# Patient Record
Sex: Female | Born: 1965 | Race: Black or African American | Hispanic: No | Marital: Married | Smoking: Current every day smoker
Health system: Southern US, Community
[De-identification: ages and names within clinical notes are randomized; demographics above are authoritative.]

## PROBLEM LIST (undated history)

## (undated) DIAGNOSIS — G459 Transient cerebral ischemic attack, unspecified: Secondary | ICD-10-CM

## (undated) DIAGNOSIS — I2510 Atherosclerotic heart disease of native coronary artery without angina pectoris: Secondary | ICD-10-CM

## (undated) DIAGNOSIS — I255 Ischemic cardiomyopathy: Secondary | ICD-10-CM

## (undated) DIAGNOSIS — I639 Cerebral infarction, unspecified: Secondary | ICD-10-CM

## (undated) DIAGNOSIS — I10 Essential (primary) hypertension: Secondary | ICD-10-CM

## (undated) DIAGNOSIS — J45909 Unspecified asthma, uncomplicated: Secondary | ICD-10-CM

## (undated) DIAGNOSIS — K219 Gastro-esophageal reflux disease without esophagitis: Secondary | ICD-10-CM

## (undated) DIAGNOSIS — R51 Headache: Secondary | ICD-10-CM

## (undated) DIAGNOSIS — R519 Headache, unspecified: Secondary | ICD-10-CM

## (undated) DIAGNOSIS — E7800 Pure hypercholesterolemia, unspecified: Secondary | ICD-10-CM

## (undated) DIAGNOSIS — D219 Benign neoplasm of connective and other soft tissue, unspecified: Secondary | ICD-10-CM

## (undated) DIAGNOSIS — Z8614 Personal history of Methicillin resistant Staphylococcus aureus infection: Secondary | ICD-10-CM

## (undated) DIAGNOSIS — G4701 Insomnia due to medical condition: Secondary | ICD-10-CM

## (undated) HISTORY — DX: Ischemic cardiomyopathy: I25.5

## (undated) HISTORY — PX: WISDOM TOOTH EXTRACTION: SHX21

## (undated) HISTORY — DX: Atherosclerotic heart disease of native coronary artery without angina pectoris: I25.10

---

## 1998-12-27 ENCOUNTER — Inpatient Hospital Stay (HOSPITAL_COMMUNITY): Admission: EM | Admit: 1998-12-27 | Discharge: 1999-01-03 | Payer: Self-pay | Admitting: Emergency Medicine

## 1998-12-27 ENCOUNTER — Encounter: Payer: Self-pay | Admitting: Emergency Medicine

## 1998-12-30 ENCOUNTER — Encounter: Payer: Self-pay | Admitting: General Surgery

## 1999-10-02 ENCOUNTER — Other Ambulatory Visit: Admission: RE | Admit: 1999-10-02 | Discharge: 1999-10-02 | Payer: Self-pay | Admitting: Gynecology

## 2000-06-16 ENCOUNTER — Emergency Department (HOSPITAL_COMMUNITY): Admission: EM | Admit: 2000-06-16 | Discharge: 2000-06-16 | Payer: Self-pay | Admitting: Emergency Medicine

## 2000-08-31 ENCOUNTER — Other Ambulatory Visit: Admission: RE | Admit: 2000-08-31 | Discharge: 2000-08-31 | Payer: Self-pay | Admitting: Internal Medicine

## 2000-09-03 ENCOUNTER — Encounter: Payer: Self-pay | Admitting: Internal Medicine

## 2000-09-03 ENCOUNTER — Encounter: Admission: RE | Admit: 2000-09-03 | Discharge: 2000-09-03 | Payer: Self-pay | Admitting: Internal Medicine

## 2001-07-12 ENCOUNTER — Encounter (INDEPENDENT_AMBULATORY_CARE_PROVIDER_SITE_OTHER): Payer: Self-pay | Admitting: Specialist

## 2001-07-12 ENCOUNTER — Ambulatory Visit (HOSPITAL_COMMUNITY): Admission: AD | Admit: 2001-07-12 | Discharge: 2001-07-12 | Payer: Self-pay | Admitting: *Deleted

## 2001-07-12 ENCOUNTER — Encounter: Payer: Self-pay | Admitting: Emergency Medicine

## 2003-02-19 ENCOUNTER — Encounter (INDEPENDENT_AMBULATORY_CARE_PROVIDER_SITE_OTHER): Payer: Self-pay | Admitting: Specialist

## 2003-02-19 ENCOUNTER — Encounter: Payer: Self-pay | Admitting: Obstetrics and Gynecology

## 2003-02-19 ENCOUNTER — Ambulatory Visit (HOSPITAL_COMMUNITY): Admission: AD | Admit: 2003-02-19 | Discharge: 2003-02-19 | Payer: Self-pay | Admitting: Obstetrics and Gynecology

## 2003-02-28 ENCOUNTER — Other Ambulatory Visit: Admission: RE | Admit: 2003-02-28 | Discharge: 2003-02-28 | Payer: Self-pay | Admitting: Obstetrics and Gynecology

## 2003-05-18 ENCOUNTER — Other Ambulatory Visit: Admission: RE | Admit: 2003-05-18 | Discharge: 2003-05-18 | Payer: Self-pay | Admitting: Obstetrics and Gynecology

## 2003-12-26 ENCOUNTER — Emergency Department (HOSPITAL_COMMUNITY): Admission: EM | Admit: 2003-12-26 | Discharge: 2003-12-26 | Payer: Self-pay

## 2004-04-28 ENCOUNTER — Emergency Department (HOSPITAL_COMMUNITY): Admission: EM | Admit: 2004-04-28 | Discharge: 2004-04-28 | Payer: Self-pay | Admitting: Emergency Medicine

## 2004-11-03 ENCOUNTER — Emergency Department (HOSPITAL_COMMUNITY): Admission: EM | Admit: 2004-11-03 | Discharge: 2004-11-03 | Payer: Self-pay | Admitting: Emergency Medicine

## 2005-04-04 ENCOUNTER — Emergency Department (HOSPITAL_COMMUNITY): Admission: EM | Admit: 2005-04-04 | Discharge: 2005-04-04 | Payer: Self-pay | Admitting: Family Medicine

## 2005-10-05 ENCOUNTER — Emergency Department (HOSPITAL_COMMUNITY): Admission: EM | Admit: 2005-10-05 | Discharge: 2005-10-06 | Payer: Self-pay | Admitting: Emergency Medicine

## 2005-10-05 ENCOUNTER — Emergency Department (HOSPITAL_COMMUNITY): Admission: EM | Admit: 2005-10-05 | Discharge: 2005-10-05 | Payer: Self-pay | Admitting: Emergency Medicine

## 2005-11-17 ENCOUNTER — Emergency Department (HOSPITAL_COMMUNITY): Admission: EM | Admit: 2005-11-17 | Discharge: 2005-11-17 | Payer: Self-pay | Admitting: Family Medicine

## 2005-12-08 ENCOUNTER — Emergency Department (HOSPITAL_COMMUNITY): Admission: EM | Admit: 2005-12-08 | Discharge: 2005-12-08 | Payer: Self-pay | Admitting: Family Medicine

## 2006-04-21 ENCOUNTER — Inpatient Hospital Stay (HOSPITAL_COMMUNITY): Admission: EM | Admit: 2006-04-21 | Discharge: 2006-04-26 | Payer: Self-pay | Admitting: Emergency Medicine

## 2007-04-04 ENCOUNTER — Emergency Department (HOSPITAL_COMMUNITY): Admission: EM | Admit: 2007-04-04 | Discharge: 2007-04-04 | Payer: Self-pay | Admitting: Emergency Medicine

## 2007-05-10 ENCOUNTER — Emergency Department (HOSPITAL_COMMUNITY): Admission: EM | Admit: 2007-05-10 | Discharge: 2007-05-11 | Payer: Self-pay | Admitting: *Deleted

## 2007-09-28 ENCOUNTER — Emergency Department (HOSPITAL_COMMUNITY): Admission: EM | Admit: 2007-09-28 | Discharge: 2007-09-28 | Payer: Self-pay | Admitting: Emergency Medicine

## 2008-01-07 ENCOUNTER — Emergency Department (HOSPITAL_COMMUNITY): Admission: EM | Admit: 2008-01-07 | Discharge: 2008-01-07 | Payer: Self-pay | Admitting: Emergency Medicine

## 2008-05-09 ENCOUNTER — Emergency Department (HOSPITAL_COMMUNITY): Admission: EM | Admit: 2008-05-09 | Discharge: 2008-05-09 | Payer: Self-pay | Admitting: Family Medicine

## 2008-05-20 LAB — CONVERTED CEMR LAB: Pap Smear: NEGATIVE

## 2008-06-06 ENCOUNTER — Ambulatory Visit (HOSPITAL_COMMUNITY): Admission: RE | Admit: 2008-06-06 | Discharge: 2008-06-06 | Payer: Self-pay | Admitting: Obstetrics

## 2009-04-22 ENCOUNTER — Ambulatory Visit (HOSPITAL_BASED_OUTPATIENT_CLINIC_OR_DEPARTMENT_OTHER): Admission: RE | Admit: 2009-04-22 | Discharge: 2009-04-22 | Payer: Self-pay | Admitting: Cardiology

## 2009-04-22 ENCOUNTER — Encounter: Payer: Self-pay | Admitting: Internal Medicine

## 2009-04-26 ENCOUNTER — Emergency Department (HOSPITAL_COMMUNITY): Admission: EM | Admit: 2009-04-26 | Discharge: 2009-04-26 | Payer: Self-pay | Admitting: Family Medicine

## 2009-04-28 ENCOUNTER — Ambulatory Visit: Payer: Self-pay | Admitting: Internal Medicine

## 2009-05-15 ENCOUNTER — Emergency Department (HOSPITAL_COMMUNITY): Admission: EM | Admit: 2009-05-15 | Discharge: 2009-05-15 | Payer: Self-pay | Admitting: Emergency Medicine

## 2009-05-16 ENCOUNTER — Telehealth: Payer: Self-pay | Admitting: Internal Medicine

## 2009-05-18 ENCOUNTER — Emergency Department (HOSPITAL_COMMUNITY): Admission: EM | Admit: 2009-05-18 | Discharge: 2009-05-18 | Payer: Self-pay | Admitting: Emergency Medicine

## 2009-05-27 ENCOUNTER — Ambulatory Visit: Payer: Self-pay | Admitting: Internal Medicine

## 2009-05-27 DIAGNOSIS — Z9189 Other specified personal risk factors, not elsewhere classified: Secondary | ICD-10-CM | POA: Insufficient documentation

## 2009-05-27 DIAGNOSIS — I10 Essential (primary) hypertension: Secondary | ICD-10-CM | POA: Insufficient documentation

## 2009-05-27 DIAGNOSIS — G4700 Insomnia, unspecified: Secondary | ICD-10-CM | POA: Insufficient documentation

## 2009-05-27 DIAGNOSIS — Z8673 Personal history of transient ischemic attack (TIA), and cerebral infarction without residual deficits: Secondary | ICD-10-CM | POA: Insufficient documentation

## 2009-05-28 ENCOUNTER — Telehealth: Payer: Self-pay | Admitting: Internal Medicine

## 2009-05-29 ENCOUNTER — Telehealth (INDEPENDENT_AMBULATORY_CARE_PROVIDER_SITE_OTHER): Payer: Self-pay | Admitting: *Deleted

## 2009-05-29 ENCOUNTER — Telehealth: Payer: Self-pay | Admitting: Internal Medicine

## 2009-06-10 ENCOUNTER — Telehealth (INDEPENDENT_AMBULATORY_CARE_PROVIDER_SITE_OTHER): Payer: Self-pay | Admitting: *Deleted

## 2009-06-12 ENCOUNTER — Telehealth: Payer: Self-pay | Admitting: Internal Medicine

## 2009-06-17 ENCOUNTER — Telehealth: Payer: Self-pay | Admitting: Internal Medicine

## 2009-06-18 ENCOUNTER — Ambulatory Visit: Payer: Self-pay | Admitting: Internal Medicine

## 2009-06-18 DIAGNOSIS — F17200 Nicotine dependence, unspecified, uncomplicated: Secondary | ICD-10-CM | POA: Insufficient documentation

## 2009-06-18 DIAGNOSIS — Z87898 Personal history of other specified conditions: Secondary | ICD-10-CM | POA: Insufficient documentation

## 2009-06-28 ENCOUNTER — Telehealth: Payer: Self-pay | Admitting: Internal Medicine

## 2009-07-02 ENCOUNTER — Telehealth: Payer: Self-pay | Admitting: Internal Medicine

## 2009-07-05 ENCOUNTER — Telehealth: Payer: Self-pay | Admitting: Internal Medicine

## 2009-07-23 ENCOUNTER — Telehealth: Payer: Self-pay | Admitting: Internal Medicine

## 2009-07-24 ENCOUNTER — Telehealth: Payer: Self-pay | Admitting: Internal Medicine

## 2009-07-29 ENCOUNTER — Telehealth: Payer: Self-pay | Admitting: Internal Medicine

## 2009-08-08 ENCOUNTER — Telehealth (INDEPENDENT_AMBULATORY_CARE_PROVIDER_SITE_OTHER): Payer: Self-pay | Admitting: *Deleted

## 2009-08-12 ENCOUNTER — Telehealth: Payer: Self-pay | Admitting: Internal Medicine

## 2009-08-15 ENCOUNTER — Ambulatory Visit: Payer: Self-pay | Admitting: Internal Medicine

## 2009-08-19 ENCOUNTER — Telehealth (INDEPENDENT_AMBULATORY_CARE_PROVIDER_SITE_OTHER): Payer: Self-pay | Admitting: *Deleted

## 2009-08-20 ENCOUNTER — Encounter: Payer: Self-pay | Admitting: Internal Medicine

## 2009-08-30 ENCOUNTER — Telehealth: Payer: Self-pay | Admitting: Internal Medicine

## 2009-09-02 ENCOUNTER — Telehealth: Payer: Self-pay | Admitting: Internal Medicine

## 2009-09-03 ENCOUNTER — Encounter: Payer: Self-pay | Admitting: Internal Medicine

## 2009-09-04 ENCOUNTER — Telehealth: Payer: Self-pay | Admitting: Internal Medicine

## 2009-09-11 ENCOUNTER — Encounter: Payer: Self-pay | Admitting: Internal Medicine

## 2009-09-18 ENCOUNTER — Emergency Department (HOSPITAL_COMMUNITY): Admission: EM | Admit: 2009-09-18 | Discharge: 2009-09-18 | Payer: Self-pay | Admitting: Family Medicine

## 2009-09-23 ENCOUNTER — Emergency Department (HOSPITAL_COMMUNITY): Admission: EM | Admit: 2009-09-23 | Discharge: 2009-09-23 | Payer: Self-pay | Admitting: Family Medicine

## 2009-09-24 ENCOUNTER — Telehealth: Payer: Self-pay | Admitting: Internal Medicine

## 2009-10-04 ENCOUNTER — Emergency Department (HOSPITAL_COMMUNITY): Admission: EM | Admit: 2009-10-04 | Discharge: 2009-10-04 | Payer: Self-pay | Admitting: Emergency Medicine

## 2009-10-08 ENCOUNTER — Telehealth: Payer: Self-pay | Admitting: Internal Medicine

## 2009-10-16 ENCOUNTER — Telehealth: Payer: Self-pay | Admitting: Internal Medicine

## 2009-10-16 ENCOUNTER — Encounter: Payer: Self-pay | Admitting: Internal Medicine

## 2009-11-25 ENCOUNTER — Telehealth (INDEPENDENT_AMBULATORY_CARE_PROVIDER_SITE_OTHER): Payer: Self-pay | Admitting: *Deleted

## 2009-12-04 ENCOUNTER — Telehealth: Payer: Self-pay | Admitting: Internal Medicine

## 2009-12-17 ENCOUNTER — Telehealth: Payer: Self-pay | Admitting: Internal Medicine

## 2010-01-13 ENCOUNTER — Emergency Department (HOSPITAL_COMMUNITY): Admission: EM | Admit: 2010-01-13 | Discharge: 2010-01-13 | Payer: Self-pay | Admitting: Emergency Medicine

## 2010-01-15 ENCOUNTER — Emergency Department (HOSPITAL_COMMUNITY): Admission: EM | Admit: 2010-01-15 | Discharge: 2010-01-15 | Payer: Self-pay | Admitting: Emergency Medicine

## 2010-02-07 ENCOUNTER — Telehealth (INDEPENDENT_AMBULATORY_CARE_PROVIDER_SITE_OTHER): Payer: Self-pay | Admitting: *Deleted

## 2010-02-12 ENCOUNTER — Telehealth (INDEPENDENT_AMBULATORY_CARE_PROVIDER_SITE_OTHER): Payer: Self-pay | Admitting: *Deleted

## 2010-02-24 ENCOUNTER — Ambulatory Visit: Payer: Self-pay | Admitting: Internal Medicine

## 2010-02-27 ENCOUNTER — Telehealth: Payer: Self-pay | Admitting: Internal Medicine

## 2010-04-10 ENCOUNTER — Telehealth (INDEPENDENT_AMBULATORY_CARE_PROVIDER_SITE_OTHER): Payer: Self-pay | Admitting: *Deleted

## 2010-04-18 ENCOUNTER — Telehealth: Payer: Self-pay | Admitting: Internal Medicine

## 2010-04-22 ENCOUNTER — Telehealth: Payer: Self-pay | Admitting: Internal Medicine

## 2010-04-25 ENCOUNTER — Encounter: Payer: Self-pay | Admitting: Internal Medicine

## 2010-04-25 ENCOUNTER — Ambulatory Visit: Payer: Self-pay | Admitting: Internal Medicine

## 2010-04-28 ENCOUNTER — Telehealth: Payer: Self-pay | Admitting: Internal Medicine

## 2010-04-28 LAB — CONVERTED CEMR LAB
ALT: 10 units/L (ref 0–35)
AST: 15 units/L (ref 0–37)
Albumin: 4.7 g/dL (ref 3.5–5.2)
Alkaline Phosphatase: 82 units/L (ref 39–117)
BUN: 25 mg/dL — ABNORMAL HIGH (ref 6–23)
Basophils Absolute: 0 10*3/uL (ref 0.0–0.1)
Basophils Relative: 0 % (ref 0–1)
CO2: 22 meq/L (ref 19–32)
Calcium: 9.5 mg/dL (ref 8.4–10.5)
Chloride: 105 meq/L (ref 96–112)
Cholesterol: 179 mg/dL (ref 0–200)
Creatinine, Ser: 1.69 mg/dL — ABNORMAL HIGH (ref 0.40–1.20)
Eosinophils Absolute: 0.1 10*3/uL (ref 0.0–0.7)
Eosinophils Relative: 1 % (ref 0–5)
Glucose, Bld: 94 mg/dL (ref 70–99)
HCT: 42.3 % (ref 36.0–46.0)
HDL: 65 mg/dL (ref >39)
Hemoglobin: 13.9 g/dL (ref 12.0–15.0)
LDL Cholesterol: 92 mg/dL (ref 0–99)
Lymphocytes Relative: 39 % (ref 12–46)
Lymphs Abs: 2.7 10*3/uL (ref 0.7–4.0)
MCHC: 32.9 g/dL (ref 30.0–36.0)
MCV: 89.1 fL (ref 78.0–100.0)
Monocytes Absolute: 0.4 10*3/uL (ref 0.1–1.0)
Monocytes Relative: 6 % (ref 3–12)
Neutro Abs: 3.7 10*3/uL (ref 1.7–7.7)
Neutrophils Relative %: 53 % (ref 43–77)
Platelets: 236 10*3/uL (ref 150–400)
Potassium: 4.5 meq/L (ref 3.5–5.3)
RBC: 4.75 M/uL (ref 3.87–5.11)
RDW: 15.6 % — ABNORMAL HIGH (ref 11.5–15.5)
Sodium: 139 meq/L (ref 135–145)
TSH: 0.889 microintl units/mL (ref 0.350–4.500)
Total Bilirubin: 0.5 mg/dL (ref 0.3–1.2)
Total CHOL/HDL Ratio: 2.8
Total Protein: 7.2 g/dL (ref 6.0–8.3)
Triglycerides: 110 mg/dL (ref <150)
VLDL: 22 mg/dL (ref 0–40)
WBC: 6.9 10*3/uL (ref 4.0–10.5)

## 2010-05-26 ENCOUNTER — Emergency Department (HOSPITAL_COMMUNITY)
Admission: EM | Admit: 2010-05-26 | Discharge: 2010-05-26 | Payer: Self-pay | Source: Home / Self Care | Admitting: Emergency Medicine

## 2010-07-22 ENCOUNTER — Emergency Department (HOSPITAL_COMMUNITY)
Admission: EM | Admit: 2010-07-22 | Discharge: 2010-07-22 | Payer: Self-pay | Source: Home / Self Care | Admitting: Emergency Medicine

## 2010-08-19 NOTE — Letter (Signed)
Summary: Physicians Acommodation Certificate/[employer]  Physicians Acommodation Certificate/[employer]   Imported By: Sherian Rein 08/23/2009 07:38:34  _____________________________________________________________________  External Attachment:    Type:   Image     Comment:   External Document

## 2010-08-19 NOTE — Progress Notes (Signed)
Summary: medication question  Phone Note Call from Patient Call back at [phone]   Caller: Patient Call For: Jadore Veals Reason for Call: Talk to Nurse Summary of Call: Has a question about the quantity of her xanax 1mg . Initial call taken by: Darletta Moll,  Dec 17, 2009 3:08 PM  Follow-up for Phone Call        pt wanted to know what to do after she runs out of refills, because she is applying for medicaid and cannot come in for ov until after this is approved. Sh estates she is trying to get appt at [facility]. I advised if [facility] gets [facility] then they may refill her med. If not she can call and we can ask CY if ok to refill as long as it is not an early refill. She states she will let us know. [name] CMA  Dec 17, 2009 3:45 PM

## 2010-08-19 NOTE — Progress Notes (Signed)
Summary: pt request  Phone Note Call from Patient Call back at Home Phone [phone]   Caller: Patient Summary of Call: pt called requesting a letter from MD for her job stating that Exforge causes frequent urination, requiring frequent breaks. ok to generate? Initial call taken by: Margaret Pyle, CMA,  July 24, 2009 11:54 AM  Follow-up for Phone Call        her exforge does not have diuretic - and it does not cause frequent urination -  therefore, no work note -  thanks Follow-up by: Newt Lukes MD,  July 24, 2009 12:46 PM  Additional Follow-up for Phone Call Additional follow up Details #1::        left message on machine for pt to return my call  Additional Follow-up by: Margaret Pyle, CMA,  July 24, 2009 1:23 PM    Additional Follow-up for Phone Call Additional follow up Details #2::    pt informed Follow-up by: Margaret Pyle, CMA,  July 24, 2009 1:43 PM

## 2010-08-19 NOTE — Progress Notes (Signed)
Summary: CONTACT # FOR PATIENT  Phone Note Call from Patient Call back at [phone]   Call For: Corinne Goucher Summary of Call: PT'S PHONE HAVE BEEN DISCONNECTED CALLING TO GIVE UPDATED ONE WHICH IS [phone] . SHE CAN BE REACHED AT THIS NUMBER WHEN FAX IS SENT FROM NOC Initial call taken by: Rickard Patience,  October 08, 2009 2:30 PM  Follow-up for Phone Call        ATC to call pt and verify which # is correct so I can update her demographics.  ATC [phone] and VM stated "This is Tammy." LM for pt to call me back.  ATC pt at [phone] and line busy. Aundra Millet Reynolds LPN  October 08, 2009 4:00 PM   I spoke to pt sister and she states pt contact numbers are currently disconnected and pt wants [place] to call [phone] and leave a messge and pt sister will pass on to the patient. [name] CMA  October 08, 2009 5:29 PM

## 2010-08-19 NOTE — Assessment & Plan Note (Signed)
Summary: rov ///kp   Copy to:  Dr Shana Chute Primary Provider/Referring Provider:  Newt Lukes MD  CC:  2 month follow up pt  states she needs to take two 1 mg xanax at bedtime to sleep.  History of Present Illness:  May 27, 2009-  45 yo F referred courtesy of Dr Shana Chute for sleep medicine evaluation, complaining of Insomnia. States she doesn't sleep at all at night, but fights daytime sleepiness which is interfering with her work. There is a long hx of some difficulty initiating or maintaining sleep but it became worse 3-4 years ago when she had ministrokes related to hypertension. Over the past year she has tried [place], [place], Dentist. Ambien affected memory. Combined with Pristiq she lost hair. Xanax helped, taking 2 mg to get 3-4 hrs sleep.. She has gone to ER after getting no sleep in 5 days- got a shot for her "nerves" NPSG at [facility] center 04/22/09 : Light and fragmented sleep, Epworth 7/24. efficiency 62% with no movement related  or respiratory disturbance. Bedtime 9-930 PM, often up all night. Up by 6AM Gets drowsy in daytime. Little caffeine. Her sleep is not disturbed by presence of her boyfriend and she feels physically comfortable.  August 15, 2009- Chronic insomnia, Hx CVA, HTN Complains she is still not sleeping. This began after her CVA. 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 She brings a form related to work. Still takes Xanax 2 mg at 930 or 10 but she she wakes too early. She is able to work some days, but other days she doesn't feel safe to drive- mainly if she doesn't feel able to think straight, memory is no good, vision gets blurry. She doesn't describe major headache, vertigo, syncope, palpitation, chest pain or paresthesias.   Current Medications (verified): 1)  Exforge 10-320 Mg Tabs (Amlodipine Besylate-Valsartan) .... Take 1 By Mouth Once Daily 2)  Xanax 1 Mg Tabs (Alprazolam) .... Take 1 By Mouth Three Times A Day As Needed 3)  Aspirin 81  Mg Tabs (Aspirin) .Marland Kitchen.. 1 By Mouth Once Daily  Allergies (verified): 1)  ! Pcn 2)  ! Sulfa  Past History:  Past Medical History: Last updated: 06/18/2009 INSOMNIA, CHRONIC (ICD-307.42)  NPSG 04/22/09- AHI 0/hr HYPERTENSION STROKE , hx  Past Surgical History: Last updated: 05/27/2009 None  Family History: Last updated: 06/18/2009 Asthma-Siblings. Family History of Alcoholism/Addiction (parent) Family History Hypertension (other relative) Family History Kidney disease (other relative) Family History of Stroke F 1st degree relative <60 (other relative)  Social History: Last updated: 06/18/2009 Single, lives with boyfriend; no children. 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    Vital Signs:  Patient profile:   45 year old female Height:      67 inches Weight:      166.4 pounds BMI:     26.16 O2 Sat:      99 % on Room air Pulse rate:   91 / minute BP sitting:   134 / 88  (left arm) Cuff size:   regular  O2 Sat at Rest %:  99% O2 Flow:  Room air CC: 2  month follow up pt  states she needs to take two 1 mg xanax at bedtime to sleep Comments Medications reviewed with patient Renold Genta RCP, LPN  August 15, 2009 3:47 PM    Physical Exam  Additional Exam:  General: A/Ox3; pleasant and cooperative, NAD, slender, normally wakeful, affect is calm SKIN: no rash, lesions NODES: no lymphadenopathy HEENT: [place]/AT, EOM- WNL, Conjuctivae- clear, PERRLA, TM-WNL, Nose- clear, Throat- clear and wnl, NECK: Supple w/ fair ROM,  JVD- none, normal carotid impulses w/o bruits Thyroid- normal to palpation CHEST: Clear to P&A HEART: RRR, no m/g/r heard ABDOMEN: Soft and nl; nml bowel sounds;  ZOX:WRUE, nl pulses, no edema  NEURO: Grossly intact to observation, no tremor      Impression & Recommendations:  Problem # 1:  INSOMNIA, CHRONIC (ICD-307.42)  Chronic insomnia reflecting hx CVA and stressful job. She doesn't use caffeine because mother had renal failure- avoids soft drinks. I told her she could try low dose caffeine occasionally. She needs some help managing the stress and work pressures of her job, allowing for some days when she hasn't achieved sleep and is unable to function safely. She will have to decide with her employer just what her options are. We will leave her with Xanax for now. Continue efforts with sleep hygiene. 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  Other Orders: Est. Patient Level II (45409)  Patient Instructions: 1)  Please schedule a follow-up appointment in 3 months. 2)  Work letter 3)  Consider trying Caffeine/ NoDoz caplet- 1/2 tab occasionally might help get you through mid-day tiredness so you don't nap and use up your night time sleepiness.

## 2010-08-19 NOTE — Progress Notes (Signed)
Summary: FMLA  Phone Note Call from Patient Call back at Home Phone [phone]   Caller: Patient Summary of Call: pt called stating that she needs to have MD renew her FMLA forms because Dr. Felicity Coyer is her PCP. Pt states that forms need to be renewed January of every year. Pt states that she must have "misunderstood" me when I informed her last time she called (08/08/2009) that MD has declined to fill out FMLA and advised pt to contact Dr. Maple Hudson. pt is requesting a call back after 3pm. please advise Initial call taken by: Margaret Pyle, CMA,  August 12, 2009 9:57 AM  Follow-up for Phone Call        I will not complete her FMLA form as requested -if she is missing days of work due to poor sleep, she needs to see her sleep doctor with OV re: same and get an excuse for the days missed as they occur - sorry, but i do not feel it is appropriate for me to complete the FMLA papers as she has requested for the reasons stated - also, per our e-chart, it appears pulm is working on this issue for her - thanks Follow-up by: Newt Lukes MD,  August 12, 2009 10:09 AM  Additional Follow-up for Phone Call Additional follow up Details #1::        pt informed via VM that MD declines to fill out paperwork. pt told to call back if she had any further questions or concerns Additional Follow-up by: Margaret Pyle, CMA,  August 13, 2009 9:06 AM

## 2010-08-19 NOTE — Progress Notes (Signed)
Summary: FMLA papers-speak to Karen Ochoa  Phone Note Call from Patient Call back at [facility] Phone [phone]   Caller: Patient Call For: young Summary of Call: pt wants katie to call her AFTER 2:15 pm today re: "why dr young won't fill out her FMLA papers". [phone] Initial call taken by: Tivis Ringer, CNA,  August 12, 2009 9:39 AM  Follow-up for Phone Call        Spoke with Renee in [facility]; no FMLA papers there. Will call pt after 2:15 today; have her letter for work(need to read to pt and let her approve it before sending to her job). Reynaldo Minium CMA  August 12, 2009 9:57 AM   Additional Follow-up for Phone Call Additional follow up Details #1::        Spoke withpt on 08-12-09 ; aware of letter being dictated and states she doesnt want it sent to her job until she can talk with CDY about FMLA(I told her to bring papers with her to 08-15-09 appt at 330 with CDY-see if he is willing to fill out for sleep problems. Pt states she agreed to this and will be here for her appt. Reynaldo Minium CMA  August 13, 2009 3:32 PM

## 2010-08-19 NOTE — Progress Notes (Signed)
Summary: letter  Phone Note Call from Patient Call back at Home Phone [phone]   Caller: Patient Call For: Joel Mericle Summary of Call: Please call, re: FMLA papers. Initial call taken by: Darletta Moll,  July 23, 2009 11:59 AM  Follow-up for Phone Call        called and spoke with pt.  pt states she need another letter like the one Dr. Maple Hudson wrote for her 05-31-2009 ( which has since expired)  to give to her employer stating what he treats her for and that she will have routine f/u appts with CY.  Please advise.  Aundra Millet Reynolds LPN  July 23, 2009 12:34 PM   Additional Follow-up for Phone Call Additional follow up Details #1::        Done Additional Follow-up by: Waymon Budge MD,  July 24, 2009 1:53 PM    Additional Follow-up for Phone Call Additional follow up Details #2::    pt calling to check on her note from CDY - letting her job know that she has a problem w/insomnia and that sometimes she struggles to stay awake at work and sometimes she's out due to insomnia and that she has regular appt's with CDY.  Please call pt, [phone] , fax# [phone] Attn: Mosetta Putt or Chiquita Loth.  Please call pt before you fax note. Follow-up by: Eugene Gavia,  July 24, 2009 2:01 PM  Additional Follow-up for Phone Call Additional follow up Details #3:: Details for Additional Follow-up Action Taken: Pt is aware letter has been dictated and CDY is awaiting this from transcription. I will forward this to Florentina Addison so can be looking for this.Michel Bickers CMA  July 24, 2009 3:41 PM  pt is on 8-4:30 shift, having a bad day, going home early and really needs this note so she doesn't lose her job.  Please call pt before you fax note to her job.  [phone] .Eugene Gavia  July 25, 2009 1:22 PM  Letter placed on CY cart. Zackery Barefoot CMA  July 25, 2009 4:10 PM   Spoke with pt; aware of what letter states and that I have faxed it to her job. Pt requests letter Brendolyn Patty)  be mailed to her address as follows: [address] [place] [phone].Reynaldo Minium CMA  July 25, 2009 5:19 PM

## 2010-08-19 NOTE — Progress Notes (Signed)
Summary: rx  Phone Note Call from Patient Call back at [phone] after 2   Caller: Patient Call For: Tenea Sens Reason for Call: Refill Medication, Talk to Nurse Summary of Call: pt needs refill on her xanax.  Pharmacy said they sent over 2 request. pharmacy is contacting dr spruill instead of dr Geriann Lafont for refill Initial call taken by: Eugene Gavia,  Dec 17, 2009 8:41 AM  Follow-up for Phone Call        refill sent. pt aware.[name] CMA  Dec 17, 2009 9:47 AM     Prescriptions: Prudy Feeler 1 MG TABS (ALPRAZOLAM) take 1 by mouth three times a day as needed  #90 x 1   Entered by:   [name] CMA   Authorized by:   Waymon Budge MD   Signed by:   [name] CMA on 12/17/2009   Method used:   Telephoned to ...       [facility]       [address]       [place], [place]  [address]       Ph: [phone]       Fax: [phone]   RxID:   [id number]

## 2010-08-19 NOTE — Letter (Signed)
Summary: Physicians Acommodation Certificate/[employer]  Physicians Acommodation Certificate/[employer]   Imported By: Lennie Odor 10/03/2009 14:36:14  _____________________________________________________________________  External Attachment:    Type:   Image     Comment:   External Document

## 2010-08-19 NOTE — Progress Notes (Signed)
Summary: fax request  Phone Note Call from Patient Call back at Home Phone [phone]   Caller: Patient Call For: Raguel Kosloski Summary of Call: per Caryl Asp conversation with pt- i am sending this msg to [place]. pt requests a copy of letter (physicians accomodation) be faxed to: attN joey boyd / kim craddock fax # [phone] Initial call taken by: Tivis Ringer, CNA,  August 30, 2009 2:42 PM  Follow-up for Phone Call        done.Reynaldo Minium CMA  August 30, 2009 5:22 PM

## 2010-08-19 NOTE — Progress Notes (Signed)
Summary: Forms  Phone Note Other Incoming   Caller: Patient Summary of Call: Pt returning call.  Informed pt per previous phone note we were trying to reach her to update her demographics to a phone number where she can be reached.  Per pt, she will be getting her phone turned back on Friday Morning [phone] ).  But until then we can try to reach her at her cousin's number - [phone].  Pt also stated she had a conference call with [employer] approx 2 wks ago and they were supposed to fax over a form for CY to complete.  Pt requesting for the restrictions to stay at 5-6 hours until Dec 31.  Pt requesting a return call before forms are faxed back.  Will forward to CY - pls advise if you have received these forms yet if and so, have they been completed.  Thanks! Initial call taken by: Gweneth Dimitri RN,  October 16, 2009 9:02 AM  Follow-up for Phone Call        Called pt's number-still disconnected; tried pt at her cousins number that was given; Left message there for pt to return my call.Reynaldo Minium CMA  October 21, 2009 12:19 PM   Additional Follow-up for Phone Call Additional follow up Details #1::        Spoke with pt; aware that papers have been filled out and faxed as requested. Sent down to EMR to scan in chart.Reynaldo Minium CMA  October 22, 2009 11:25 AM

## 2010-08-19 NOTE — Progress Notes (Signed)
Summary: Pharmacy change  Prescriptions: LISINOPRIL 10 MG TABS (LISINOPRIL) 1 by mouth once daily  #30 Each x 0   Entered by:   Margaret Pyle, CMA   Authorized by:   Newt Lukes MD   Signed by:   Margaret Pyle, CMA on 04/22/2010   Method used:   Faxed to ...       [facility] (retail)       [address] [place], [place]  [address]       Ph: [phone]       Fax: [phone]   RxID:   [id number] AMLODIPINE BESYLATE 10 MG TABS (AMLODIPINE BESYLATE) 1 by mouth once daily  #30 Each x 0   Entered by:   Margaret Pyle, CMA   Authorized by:   Newt Lukes MD   Signed by:   Margaret Pyle, CMA on 04/22/2010   Method used:   Faxed to ...       [facility] (retail)       [address] [place], [place]  [address]       Ph: [phone]       Fax: [phone]   RxID:   [id number]

## 2010-08-19 NOTE — Progress Notes (Signed)
Summary: Exforge   Phone Note Call from Patient Call back at Home Phone [phone]   Summary of Call: Patient left message on triage that she gets off work at 12:30 today and is requesting to come pick up samples of Exforge. Patient is having trouble getting her insurance to pay for this. Please advise. Initial call taken by: Lucious Groves,  September 02, 2009 8:52 AM  Follow-up for Phone Call        she was given pharmacetutical card with instructions on the card on how to activate - if she is unable to activate this card, we will change medication - may give 1 week sample ONLY (with new pharm card) so pt can arrange this (it is not though her insurance, it is through the drug co) - if unable to arrange coverage, needs to make OV to change meds - thanks Follow-up by: Newt Lukes MD,  September 02, 2009 10:38 AM  Additional Follow-up for Phone Call Additional follow up Details #1::        pt stated that she has lost her medical and presciption coverage through [facility] and her medication with discount card is $100+. pt also says that she is cannot afford an OV at this time. she has applied for Medicaid. please advise Additional Follow-up by: Margaret Pyle, CMA,  September 02, 2009 11:01 AM    Additional Follow-up for Phone Call Additional follow up Details #2::    will change her medications to generic -  no sample of exforge needed - see med list - e-rx done Follow-up by: Newt Lukes MD,  September 02, 2009 11:06 AM  Additional Follow-up for Phone Call Additional follow up Details #3:: Details for Additional Follow-up Action Taken: pt informed via VM, told to call back with any questions or concerns Additional Follow-up by: Margaret Pyle, CMA,  September 02, 2009 11:13 AM  New/Updated Medications: AMLODIPINE BESYLATE 10 MG TABS (AMLODIPINE BESYLATE) 1 by mouth once daily LISINOPRIL 10 MG TABS (LISINOPRIL) 1 by mouth once daily Prescriptions: LISINOPRIL 10 MG  TABS (LISINOPRIL) 1 by mouth once daily  #30 x 6   Entered and Authorized by:   Newt Lukes MD   Signed by:   Newt Lukes MD on 09/02/2009   Method used:   Electronically to        [facility] (retail)       [address]       [place], [place]  [address]       Ph: [phone]       Fax: [phone]   RxID:   [id number] AMLODIPINE BESYLATE 10 MG TABS (AMLODIPINE BESYLATE) 1 by mouth once daily  #30 x 6   Entered and Authorized by:   Newt Lukes MD   Signed by:   Newt Lukes MD on 09/02/2009   Method used:   Electronically to        [facility] (retail)       [address]       [place], [place]  [address]       Ph: [phone]       Fax: [phone]   RxID:   [id number]

## 2010-08-19 NOTE — Progress Notes (Signed)
Summary: speak to nurse - ATC x3  Phone Note Call from Patient Call back at Home Phone [phone]   Caller: Patient Call For: YOUNG Summary of Call: PT WANTS TO SPEAK TO DR Roxy Cedar NURSE RE: "THINGS SHE WANTS RELATED TO DR YOUNG RE: PT'S DIFFICULTIES WORKING".  Initial call taken by: Tivis Ringer, CNA,  February 27, 2010 3:23 PM  Follow-up for Phone Call        called and spoke with pt--she stated that she had an appt with CY on monday  8-8 and CY referred her to psych---she stated that they are not going to be able to help her with the SS/disability and she has an upcoming appt for this.  she stated that CY needs to refer back to her sleep study done at [facility]---she also stated that she is having some memory issues that she forgot to tell CY on monday.  she is having issues with her job since she is unable to sleep at night she stated that she is unable to do her job, perform physically at home or work.  please advise. thanks Randell Loop CMA  February 27, 2010 3:29 PM   Pt called again this morning, stated her job has an employee assisted program and she can see a psychologist through this, stated that CY caused her to lose her benefits, therefore she had to go on medicaid, c/o sleepiness and she stated, "I'm about to lose my mind."Juanita [facility]  February 28, 2010 10:21 AM   Additional Follow-up for Phone Call Additional follow up Details #1::        ATC pt-number is incorrect.Reynaldo Minium CMA  March 04, 2010 11:59 AM   ATC patient at number in message, was told number is incorrect.  ATC at work number, was directed to a VM for "receptionist" - did not leave msg. Boone Master CNA/MA  March 07, 2010 3:30 PM     Additional Follow-up for Phone Call Additional follow up Details #2::    ATC work number and had to leave msg on receptionist voicemail Vernie Murders  March 10, 2010 5:11 PM  ATC on homenumber and is seems to be disconnected. The message that i received said an incorrect code  was entered, but I verified the number and i dialed it correct. Also atc work number but was transferred to a voicemail that only stated it was for a receptionist so I did not leave a message. Per protocol I will sign off on message an await pt to call. [name] CMA  March 11, 2010 3:27 PM

## 2010-08-19 NOTE — Progress Notes (Signed)
Summary: speak to nurse  Phone Note Call from Patient   Caller: Patient Call For: young Summary of Call: pt wants to speak to nurse re: a fax she requested (for her job). pt says she doesn't want anything faxed before the nurse calls her back. pt # [id number] Initial call taken by: Tivis Ringer, CNA,  August 08, 2009 9:41 AM  Follow-up for Phone Call        [facility].Michel Bickers CMA  August 08, 2009 9:50 AM  The pt is requesting that her letter for work include: 1)  that she may miss work 1-2 days weekly if she does not sleep well, and 2) coverage dates for 1 year. Please advsie. Michel Bickers CMA  August 08, 2009 10:08 AM  Additional Follow-up for Phone Call Additional follow up Details #1::        Per CDY, he is not going to add anything additional to the last letter he dictated for the patient. The patient is aware of this and says she will pick up the letter at her OV on 08/16/09 and discuss anything further with CDY at that time. Additional Follow-up by: Michel Bickers CMA,  August 08, 2009 11:20 AM

## 2010-08-19 NOTE — Progress Notes (Signed)
Summary: note for her job- pt called back x 2  Phone Note Call from Patient Call back at [facility] Phone [phone]   Caller: Patient Call For: young Reason for Call: Talk to Nurse, Talk to Doctor Summary of Call: Need note to job to say- Due to medication pt is taking, some nights she sleeps some nights she doesn't, which could result in absence sometimes one to two days.   Also needs documentation that  pt is under his care and that there are times she will be absent from work due to illness.  Don't need to reduce hours.  Need from start date to current(end date thru end of year.).  Pt says she has been up since 2:00am today. She's working on no sleep.  She needs this note asap or they may cut her hours.  She is already 36 hour employ.  Need retraction of last letter.  She needs nurse to call her asap needs to know when this will be ready.  She is not happy. PLS DISREGUARD 1- 5 FAX  Initial call taken by: Eugene Gavia,  July 29, 2009 10:17 AM  Follow-up for Phone Call        pt called to add to her msg: pt needs fax to read: pt is able to work 36 hours per week.(this along w/ rest of her msg taken by angela. Tivis Ringer  July 29, 2009 11:12 AM    Pt is aware that CDY is out of the office and will speak with him about this matter in am. Reynaldo Minium CMA  July 29, 2009 12:24 PM   Additional Follow-up for Phone Call Additional follow up Details #1::        Letter dictated. Additional Follow-up by: Waymon Budge MD,  August 06, 2009 9:48 AM

## 2010-08-19 NOTE — Progress Notes (Signed)
Summary: requesting xanax rx > sent to [facility]  Phone Note Call from Patient Call back at Home Phone [phone]   Caller: Patient Call For: young Summary of Call: pt is confused about her appts and needs help finding a medical doctor and needs help with meds Initial call taken by: Lacinda Axon,  April 10, 2010 2:41 PM  Follow-up for Phone Call        [facility].  Aundra Millet Reynolds LPN  April 10, 2010 5:02 PM   [facility] x 2 Vernie Murders  April 11, 2010 3:48 PM  ATC pt, NA  and unable to leave a msg.  WCB Vernie Murders  April 14, 2010 10:18 AM  ATC home number and msg states "number or code unavaible at this time".  Called work number and LMOVM TCB Vernie Murders  April 15, 2010 10:36 AM   Additional Follow-up for Phone Call Additional follow up Details #1::        Pt returned call.  States her number is disconnected at this time but we can reach her until 1pm at her boyfriend's number [phone]  Pt states he has been set up with [facility].  Her 1st appt with the dr there is on Oct 7.  States they will not refill any of her meds until she is seen by the dr first.  Requesting CY refill xanax to last until OV at [facility].  Would like this sent to [facility] or [facility] -- whichever one will accept it.  Also requesting confirmation that CY will accept [employer].  Dr. Maple Hudson, pls advise.  Thanks! Additional Follow-up by: Gweneth Dimitri RN,  April 16, 2010 9:08 AM    Additional Follow-up for Phone Call Additional follow up Details #2::    Spoke with Misty Stanley re: accepting [employer].  Per Misty Stanley, our office does accept this insurance.  Gweneth Dimitri RN  April 16, 2010 9:11 AM   Pt called back and states that if Dr Maple Hudson is willing to refill her rxs, she needs them sent to [facility] out[facility] pharm. Will forward to Dr Maple Hudson for review. Vernie Murders  April 16, 2010 9:13 AM  Pt called back again.   States that she will not have a phone number that we can reach her at until later this afternoon.  She states that she will call us back.  Will forward msg back to Dr Maple Hudson  Follow-up by: Vernie Murders,  April 16, 2010 11:52 AM  Additional Follow-up for Phone Call Additional follow up Details #3:: Details for Additional Follow-up Action Taken: I put one refill for xanax on med list.  Her [facility] doctor will be able to sort out when she needs me to see her.       Unable to call pt back d/t home phone # disconnected and  above documentation states she does not have a phone number to reach [place] at this afternoon and she will have to call us back.  Will wait for pt to call back to inform her rx sent to pharmacy.  Aundra Millet Reynolds LPN  April 16, 2010 2:24 PM   pt returned call.  advised that xanax was telephoned to [facility]; that [facility] doc will determine when she needs to follow up here.  pt verbalized her understanding. Boone Master CNA/MA  April 16, 2010 2:44 PM   Prescriptions: XANAX 1 MG TABS (ALPRAZOLAM) take 1 by mouth three times a day as needed  #90 x 0  Entered by:   Arman Filter LPN   Authorized by:   Waymon Budge MD   Signed by:   Arman Filter LPN on 04/54/0981   Method used:   Telephoned to ...       [facility]* (retail)       [address]       [address]/mailing       [place], [place]  [address]       Ph: [phone]       Fax: [phone]   RxID:   [id number] Prudy Feeler 1 MG TABS (ALPRAZOLAM) take 1 by mouth three times a day as needed  #90 x 0   Entered by:   Waymon Budge MD   Authorized by:   Pulmonary Triage   Signed by:   Waymon Budge MD on 04/16/2010   Method used:   Historical   RxID:   [id number]

## 2010-08-19 NOTE — Progress Notes (Signed)
Summary: talk to nurse   Phone Note Call from Patient Call back at [phone]   Caller: Patient Call For: young Reason for Call: Talk to Nurse Summary of Call: Wants to talk to nurse, re: disability. Initial call taken by: Darletta Moll,  February 07, 2010 1:27 PM  Follow-up for Phone Call        [facility] TCB. Boone Master CNA/MA  February 07, 2010 2:55 PM   Pt states sleeping issues are getting worse.  States some days she cannot sleep at all but some days the medicine will work.    Spoke with CY regarding this.  Per CY, if pt is having SLEEP ISSUES offer 1st available OV.  Pt infomred she needs to come in to discuss sleep issues with CY.  Stated she had no insurance, medicaid is pending and that's why she hadn't been back in yet.  Advised her to call [phone], ask for billing dept and talk with them about this and ask if she qualifies for financial assistance program.  She verbalized understanding and stated she would call.  In the meantime, OV sheduled for 02/24/10 at 11am with CY-pt aware. Follow-up by: Gweneth Dimitri RN,  February 07, 2010 3:35 PM

## 2010-08-19 NOTE — Progress Notes (Signed)
Summary: ASAP Medical Records release to patient  Phone Note Call from Patient Call back at [phone]   Caller: Patient Call For: Medical Records release Details for Reason: Pt was medical records released ASAP Summary of Call: Karen Ochoa left a mesage on my voicemail this morning at 8:30 am.  She states that [employer] "dropped her" and she needs to see about other health coverage.  She has asked that the records that we released to her employer be released to her.  She works from [employer] today and wanted to pick them up ASAP after work.  I called her back at [phone] and asked her to call back to [phone] and ask for medical records to discuss the release.  She is a patient of Dr. Roxy Cedar. Initial call taken by: Fabio Neighbors, Cardiology Site Manager     Appended Document: ASAP Medical Records release to patient Patient called asking for copies of forms that were completed and sent to her employer. She will fill out release form and pick up Wednesday 5/18 at the Cardiology office on [facility].  Appended Document: ASAP Medical Records release to patient Patient Picked up Records this AM @ 9:35 ROI signed also

## 2010-08-19 NOTE — Progress Notes (Signed)
Summary: talk to nurse-lmtcb  Phone Note Call from Patient   Caller: Patient Call For: Karen Ochoa Summary of Call: pt would like to talk to nurse about her condition and work status Initial call taken by: Rickard Patience,  September 24, 2009 2:48 PM  Follow-up for Phone Call        [facility]. [name] CMA  September 24, 2009 3:15 PM   called and spoke with pt.  pt states she wanted CY to know she is going to apply for Medicaid and/or [facility] and also apply for social security disability. will forward message to [facility] as an [name] LPN  September 25, 979 3:25 PM    Additional Follow-up for Phone Call Additional follow up Details #1::        Noted Additional Follow-up by: Waymon Budge MD,  September 26, 2009 9:06 AM

## 2010-08-19 NOTE — Assessment & Plan Note (Signed)
Summary: discuss sleep issues / cj   Copy to:  Dr Shana Chute Primary Provider/Referring Provider:  Newt Lukes MD  CC:  Discuss sleep issues.  History of Present Illness: May 27, 2009-  45 yo F referred courtesy of Dr Shana Chute for sleep medicine evaluation, complaining of Insomnia. States she doesn't sleep at all at night, but fights daytime sleepiness which is interfering with her work. There is a long hx of some difficulty initiating or maintaining sleep but it became worse 3-4 years ago when she had ministrokes related to hypertension. Over the past year she has tried [place], [place], Dentist. Ambien affected memory. Combined with Pristiq she lost hair. Xanax helped, taking 2 mg to get 3-4 hrs sleep.. She has gone to ER after getting no sleep in 5 days- got a shot for her "nerves" NPSG at [facility] center 04/22/09 : Light and fragmented sleep, Epworth 7/24. efficiency 62% with no movement related  or respiratory disturbance. Bedtime 9-930 PM, often up all night. Up by 6AM Gets drowsy in daytime. Little caffeine. Her sleep is not disturbed by presence of her boyfriend and she feels physically comfortable.  August 15, 2009- Chronic insomnia, Hx CVA, HTN Complains she is still not sleeping. This began after her CVA. 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 She brings a form related to work. Still takes Xanax 2 mg at 930 or 10 but she she wakes too early. She is able to work some days, but other days she doesn't feel safe to drive- mainly if she doesn't feel able to think straight, memory is no good, vision gets blurry. She doesn't describe major headache, vertigo, syncope, palpitation, chest pain or paresthesias.  February 24, 2010- Chronic insomnia, Hx CVA, HTN Missing work due to not sleeping. Says she can't concentrate, memory loss getting worse. Xanax 1 mg x 2 gets her only 3 hours sleep. Asks to try temazepam- an aquaintance uses. Boyfriend conmplains about mood swings. She was  denied for Medicaid as "not disabled". She is going to [employer]. She considers job requirement for phone x 8 hours to be stressfull. Discussed Voc Rehab. Discused hx of CVA- sound like hypertensive TIA,.  Preventive Screening-Counseling & Management  Alcohol-Tobacco     Alcohol drinks/day: <1     Alcohol Counseling: not indicated; use of alcohol is not excessive or problematic     Smoking Status: current     Smoking Cessation Counseling: yes     Tobacco Counseling: to quit use of tobacco products  Current Medications (verified): 1)  Xanax 1 Mg Tabs (Alprazolam) .... Take 1 By Mouth Three Times A Day As Needed 2)  Aspirin 81 Mg Tabs (Aspirin) .Marland Kitchen.. 1 By Mouth Once Daily 3)  Amlodipine Besylate 10 Mg Tabs (Amlodipine Besylate) .Marland Kitchen.. 1 By Mouth Once Daily 4)  Lisinopril 10 Mg Tabs (Lisinopril) .Marland Kitchen.. 1 By Mouth Once Daily  Allergies (verified): 1)  ! Pcn 2)  ! Sulfa  Past History:  Past Medical History: Last updated: 06/18/2009 INSOMNIA, CHRONIC (ICD-307.42)  NPSG 04/22/09- AHI 0/hr HYPERTENSION STROKE , hx  Past Surgical History: Last updated: 05/27/2009 None  Family History: Last updated: 06/18/2009 Asthma-Siblings. Family History of Alcoholism/Addiction (parent) Family History Hypertension (other relative) Family History Kidney disease (other relative) Family History of Stroke F 1st degree relative <60 (other relative)  Social History: Last updated: 06/18/2009 Single, lives with boyfriend; no children. 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  The patient denies anorexia, fever, weight loss, weight gain, vision loss, decreased hearing, hoarseness, chest pain, syncope, dyspnea on exertion, peripheral edema, prolonged cough,  hemoptysis, abdominal pain, and severe indigestion/heartburn.    Vital Signs:  Patient profile:   45 year old female Height:      67 inches Weight:      146.25 pounds BMI:     22.99 O2 Sat:      97 % on Room air Pulse rate:   71 / minute BP sitting:   100 / 70  (left arm) Cuff size:   regular  Vitals Entered By: Reynaldo Minium CMA (February 24, 2010 11:13 AM)  O2 Flow:  Room air CC: Discuss sleep issues   Physical Exam  Additional Exam:  General: A/Ox3; pleasant and cooperative, NAD, slender, normally wakeful, affect is calm, quiet, cooperative SKIN: no rash, lesions NODES: no lymphadenopathy HEENT: [place]/AT, EOM- WNL, Conjuctivae- clear, PERRLA, TM-WNL, Nose- clear, Throat- clear and wnl, NECK: Supple w/ fair ROM, JVD- none, normal carotid impulses w/o bruits Thyroid- normal to palpation CHEST: Clear to P&A HEART: RRR, no m/g/r heard ABDOMEN: Soft and nl; nml bowel sounds;  UEA:VWUJ, nl pulses, no edema  NEURO: Grossly intact to observation, no tremor. Clear speech, moves normally.      Impression & Recommendations:  Problem # 1:  INSOMNIA, CHRONIC (ICD-307.42)  Job stress and the whole question of her ability to work and sleep has interacted with job needs, disability status. We can let her try temazepam as she requests, instead of Xanax. She agrees to referral to [facility] for help with stress, depression and insomnia. Takes Xanax 2 mg at 9-930PM, may sleep by 1130PM, then wakes around 330AM. Usually stays up once she wakes. . Gets a nap after work for 1-2 hours. Forms to be completed for Leave from work. She has missed quite a few days of work, but is not out on disability.  Problem # 2:  SMOKER (ICD-305.1) Encouraged again to stop smoking.  Medications Added to Medication List This Visit: 1)  Temazepam 30 Mg Caps (Temazepam) .Marland Kitchen.. 1 for sleep as needed.  Other Orders: Est. Patient Level III ([address]) Psychology Referral (Psychology)  Patient Instructions: 1)   Please schedule a follow-up appointment in 2 months 2)  Forms to be completed 3)  Script for Temazepam- call for refill if helpful 4)  Do not take Xanax on top of Temazepam. You can take it earlier in the day, like getting off work, if needed. 5)  See [facility] for referral to pychologist Prescriptions: TEMAZEPAM 30 MG CAPS (TEMAZEPAM) 1 for sleep as needed.  #30 x 0   Entered and Authorized by:   Waymon Budge MD   Signed by:   Waymon Budge MD on 02/24/2010   Method used:   Print then Give to Patient   RxID:   [id number]   Prevention & Chronic Care Immunizations   Influenza vaccine: Not documented    Tetanus booster: Not documented    Pneumococcal vaccine: Not documented  Other Screening   Pap smear: Interpretation/Result:Negative for intraepithelial Lesion or Malignancy.     (05/20/2008)    Mammogram: Not documented   Smoking status: current  (02/24/2010)   Smoking cessation counseling: yes  (02/24/2010)  Lipids   Total Cholesterol:  Not documented   LDL: Not documented   LDL Direct: Not documented   HDL: Not documented   Triglycerides: Not documented  Hypertension   Last Blood Pressure: 100 / 70  (02/24/2010)   Serum creatinine: Not documented   Serum potassium Not documented  Self-Management Support :    Hypertension self-management support: Not documented

## 2010-08-19 NOTE — Progress Notes (Signed)
Summary: Rx req  Phone Note Call from Patient   Caller: Patient [phone] Summary of Call: Ptc alled stating that she lost her health Insurance and had to apply for [facility]. Pt is eligable with them and has had to change PCPs. Pt has appt end of October and requested #30 until appt. Rx sent, pt informed Initial call taken by: Margaret Pyle, CMA,  April 18, 2010 12:56 PM    Prescriptions: AMLODIPINE BESYLATE 10 MG TABS (AMLODIPINE BESYLATE) 1 by mouth once daily  #30 Each x 0   Entered by:   Margaret Pyle, CMA   Authorized by:   Newt Lukes MD   Signed by:   Margaret Pyle, CMA on 04/18/2010   Method used:   Faxed to ...       [facility] (retail)             [place], [place]         Ph:        Fax: [phone]   RxID:   [id number] LISINOPRIL 10 MG TABS (LISINOPRIL) 1 by mouth once daily  #30 Each x 0   Entered by:   Margaret Pyle, CMA   Authorized by:   Newt Lukes MD   Signed by:   Margaret Pyle, CMA on 04/18/2010   Method used:   Faxed to ...       [facility] (retail)             [place], [place]         Ph:        Fax: [phone]   RxID:   [id number]

## 2010-08-19 NOTE — Progress Notes (Signed)
Summary: nos appt  Phone Note Call from Patient   Caller: [email or web address]  Call For: young Summary of Call: ATC to rsc nos from 10/7, phone disconnected. Initial call taken by: Darletta Moll,  April 28, 2010 10:09 AM

## 2010-08-19 NOTE — Letter (Signed)
Summary: [facility]  [address]   [place], [place] [address]   Phone: [phone]  Fax: [phone]    09/11/2009   Ma. Towanda Octave. Larena Sox,      This letter is in response to our phone conversation/fax regarding Ms. [employer]. To clarify the original documents you first received; Ms. Briant Sites could miss up to approximately three(3)days of work and she also has permanent insomnia. This stands as an intermittent process; hard to say when this may occur for the patient. If any questions or concerns please call our office at [phone].      Thanks,      [employer]

## 2010-08-19 NOTE — Progress Notes (Signed)
SummaryFuller Song PT - PPIRJJ@@@- pt called again 09/10/09  Phone Note From Pharmacy Call back at [facility] Phone [phone]   Caller: Patient Call For: Crosley Stejskal Summary of Call: Florentina Addison, pt requests that you call teresa sevilla at [phone] x [phone]. pt needs this to specify "how often pt will most-likely" have these episodes; says that "occassionally" is not detailed enough. maybe once a week, etc (although pt doesn't even know herself when she will not be able to sleep at night). anyway, it needs to mention her "permanent insomnia". if these requirements are met, then pt's [facility] coverage will be reinstated. pt says this needs to be "expedited".  Initial call taken by: Tivis Ringer, CNA,  September 04, 2009 12:09 PM  Follow-up for Phone Call        Pt ask that you call and speak with her before you send anything to her job.  Said they gave her some mis-information - She said this was URGENT!!! Follow-up by: Eugene Gavia,  September 04, 2009 1:37 PM  Additional Follow-up for Phone Call Additional follow up Details #1::        Spoke with CDY-most likely to happen 3 times per month and she does indeed have permanent insomnia; pt called back to request this is the only info given to her job not anything about the amount of hours to work in a day; pt will get her benefits back in April after her exp date is up. Reynaldo Minium CMA  September 04, 2009 5:18 PM    ATC the number will try again in am.Katie [facility] CMA  September 04, 2009 5:18 PM     Additional Follow-up for Phone Call Additional follow up Details #2::    LMTCB or Rosey Bath.Reynaldo Minium CMA  September 05, 2009 10:07 AM    Spoke with Rosey Bath and she states she needs this in writing and will fax to the triage fax number a paper for CDY to sign and return back.Reynaldo Minium CMA  September 09, 2009 9:35 AM   pt wants to make sure that this fax has been sent back. she says teresa from [employer] office faxed this paper to triage fax  today. pt also wants a copy faxed back to her at [phone]. Tivis Ringer, CNA  September 10, 2009 4:58 PM  I have been in triage all day and nothing has come across the fax.  Will forward to Katie to see if she has any paperwork on this pt.  Aundra Millet Reynolds LPN  September 10, 2009 5:11 PM     I have fax from Teresa(came in on Tuesday at 301pm; left on my desk for Wednesday morning). I have faxed letter to [facility] Wednesday morning. Pt is aware of this.Reynaldo Minium CMA  September 11, 2009 8:50 AM

## 2010-08-19 NOTE — Progress Notes (Signed)
Summary: WAITING ON Karen Ochoa  Phone Note Call from Patient Call back at [facility] Phone [phone]   Caller: Patient Call For: YOUNG Summary of Call: PT WANTS HER XANAX ASAP. [facility] [address].  Initial call taken by: Tivis Ringer, CNA,  February 12, 2010 4:35 PM  Follow-up for Phone Call        CDY not in the office this afternoon.  pt is aware.  will forward request to CDY to address tomorrow. Boone Master CNA/MA  February 12, 2010 4:39 PM   Additional Follow-up for Phone Call Additional follow up Details #1::        OK to refill Xanax 1 mg, # 90, 1 three times a day as needed ------This time only. Additional Follow-up by: Waymon Budge MD,  February 12, 2010 8:17 PM    Additional Follow-up for Phone Call Additional follow up Details #2::    Rx refill left on answering service at [facility].  LMOM for pt to be made aware this has been taken care of. Follow-up by: Vernie Murders,  February 13, 2010 9:02 AM  Prescriptions: Karen Ochoa 1 MG TABS (ALPRAZOLAM) take 1 by mouth three times a day as needed  #90 x 0   Entered by:   Vernie Murders   Authorized by:   Waymon Budge MD   Signed by:   Vernie Murders on 02/13/2010   Method used:   Telephoned to ...       [facility] ([facility])       [address]       [place], [place]  [address]       Ph: [phone]       Fax: [phone]   RxID:   [id number]

## 2010-08-19 NOTE — Progress Notes (Signed)
Summary: FYI for cy  Phone Note Call from Patient   Caller: Patient Call For: Damir Leung Summary of Call: FYI: pt has applied for medicaid and is now applying for [employer]. just wanted to let dr Patricie Geeslin know this. no call back needed at this time per pt. [phone] Initial call taken by: Tivis Ringer, CNA,  Dec 04, 2009 12:07 PM  Follow-up for Phone Call        Will forward to Dr Maple Hudson as an Lorain Childes.    Follow-up by: Vernie Murders,  Dec 04, 2009 1:04 PM  Additional Follow-up for Phone Call Additional follow up Details #1::        Noted

## 2010-08-19 NOTE — Progress Notes (Signed)
Summary: work Statistician Note Call from Patient Call back at [employer] [phone]   Caller: Patient Call For: young Summary of Call: pt wants to know if her "work letter" is ready for pick up.  Initial call taken by: Tivis Ringer, CNA,  August 19, 2009 12:21 PM  Follow-up for Phone Call        Florentina Addison i dont see a letter in here for her....do you know if her letter is ready?   thanks Randell Loop CMA  August 19, 2009 1:36 PM    The letters were on my desk for her to ask for on Thursday at her appt; she failed to do so. I placed letters in mail and mailed to the address she requested they be mailed last time. Please refer to last phone note and letter for address.Reynaldo Minium CMA  August 19, 2009 1:43 PM   Additional Follow-up for Phone Call Additional follow up Details #1::        called, spoke with pt.  Pt informed of above per KW-pt states she does not need the letter anymore that she brought in a form and gave it to CY to complete.  States she was told the previous letters would be disregarded and CY would fill out the form and fax it directly to number on it.  Would like to know if this form has been completed and faxed yet because it's due on Wednesday.  Will forward to CY-please advise if this has been done yet.  Thanks! Additional Follow-up by: Gweneth Dimitri RN,  August 19, 2009 2:01 PM    Additional Follow-up for Phone Call Additional follow up Details #2::    brought in form at visit last week for CDY to fill out.  This is the form, "Letter" she needs for her job. Follow-up by: Eugene Gavia,  August 20, 2009 8:56 AM  Additional Follow-up for Phone Call Additional follow up Details #3:: Details for Additional Follow-up Action Taken: This has been completed and faxed.  called, spoke with pt.  Pt informed form has been completed and faxed.  She verbalized understanding.  Gweneth Dimitri RN  August 20, 2009 1:48 PM  Additional Follow-up by: Waymon Budge MD,   August 20, 2009 1:04 PM

## 2010-08-21 NOTE — Letter (Signed)
Summary: ADA forms/[employer]  ADA forms/[employer]   Imported By: Sherian Rein 07/24/2010 10:58:29  _____________________________________________________________________  External Attachment:    Type:   Image     Comment:   External Document

## 2010-09-29 LAB — CBC
HCT: 42.6 % (ref 36.0–46.0)
Hemoglobin: 14.4 g/dL (ref 12.0–15.0)
MCH: 30.4 pg (ref 26.0–34.0)
MCHC: 33.8 g/dL (ref 30.0–36.0)
MCV: 89.9 fL (ref 78.0–100.0)
Platelets: 240 10*3/uL (ref 150–400)
RBC: 4.74 MIL/uL (ref 3.87–5.11)
RDW: 14.3 % (ref 11.5–15.5)
WBC: 6 10*3/uL (ref 4.0–10.5)

## 2010-09-29 LAB — COMPREHENSIVE METABOLIC PANEL
ALT: 8 U/L (ref 0–35)
AST: 25 U/L (ref 0–37)
Albumin: 4.1 g/dL (ref 3.5–5.2)
Alkaline Phosphatase: 85 U/L (ref 39–117)
BUN: 14 mg/dL (ref 6–23)
CO2: 24 mEq/L (ref 19–32)
Calcium: 9.2 mg/dL (ref 8.4–10.5)
Chloride: 105 mEq/L (ref 96–112)
Creatinine, Ser: 1.35 mg/dL — ABNORMAL HIGH (ref 0.4–1.2)
GFR calc Af Amer: 51 mL/min — ABNORMAL LOW (ref >60)
GFR calc non Af Amer: 42 mL/min — ABNORMAL LOW (ref >60)
Glucose, Bld: 96 mg/dL (ref 70–99)
Potassium: 4.2 mEq/L (ref 3.5–5.1)
Sodium: 140 mEq/L (ref 135–145)
Total Bilirubin: 1 mg/dL (ref 0.3–1.2)
Total Protein: 7.5 g/dL (ref 6.0–8.3)

## 2010-09-29 LAB — URINALYSIS, ROUTINE W REFLEX MICROSCOPIC
Glucose, UA: NEGATIVE mg/dL
Hgb urine dipstick: NEGATIVE
Ketones, ur: 15 mg/dL — AB
Nitrite: NEGATIVE
Protein, ur: 30 mg/dL — AB
Specific Gravity, Urine: 1.025 (ref 1.005–1.030)
Urobilinogen, UA: 1 mg/dL (ref 0.0–1.0)
pH: 6 (ref 5.0–8.0)

## 2010-09-29 LAB — DIFFERENTIAL
Basophils Absolute: 0 10*3/uL (ref 0.0–0.1)
Basophils Relative: 1 % (ref 0–1)
Eosinophils Absolute: 0.1 10*3/uL (ref 0.0–0.7)
Eosinophils Relative: 1 % (ref 0–5)
Lymphocytes Relative: 31 % (ref 12–46)
Lymphs Abs: 1.9 10*3/uL (ref 0.7–4.0)
Monocytes Absolute: 0.6 10*3/uL (ref 0.1–1.0)
Monocytes Relative: 10 % (ref 3–12)
Neutro Abs: 3.5 10*3/uL (ref 1.7–7.7)
Neutrophils Relative %: 57 % (ref 43–77)

## 2010-09-29 LAB — POCT PREGNANCY, URINE: Preg Test, Ur: NEGATIVE

## 2010-09-29 LAB — URINE MICROSCOPIC-ADD ON

## 2010-10-05 LAB — URINE MICROSCOPIC-ADD ON

## 2010-10-05 LAB — URINALYSIS, ROUTINE W REFLEX MICROSCOPIC
Ketones, ur: 15 mg/dL — AB
Nitrite: NEGATIVE
Protein, ur: >300 mg/dL — AB
Urobilinogen, UA: 1 mg/dL (ref 0.0–1.0)

## 2010-10-05 LAB — BASIC METABOLIC PANEL WITH GFR
CO2: 25 meq/L (ref 19–32)
Calcium: 9 mg/dL (ref 8.4–10.5)
Creatinine, Ser: 1.43 mg/dL — ABNORMAL HIGH (ref 0.4–1.2)
GFR calc Af Amer: 48 mL/min — ABNORMAL LOW (ref >60)
GFR calc non Af Amer: 40 mL/min — ABNORMAL LOW (ref >60)

## 2010-10-05 LAB — BASIC METABOLIC PANEL
BUN: 13 mg/dL (ref 6–23)
BUN: 21 mg/dL (ref 6–23)
CO2: 24 mEq/L (ref 19–32)
Chloride: 107 mEq/L (ref 96–112)
GFR calc non Af Amer: 40 mL/min — ABNORMAL LOW (ref >60)
Glucose, Bld: 102 mg/dL — ABNORMAL HIGH (ref 70–99)
Glucose, Bld: 97 mg/dL (ref 70–99)
Potassium: 4.2 mEq/L (ref 3.5–5.1)
Potassium: 4.3 mEq/L (ref 3.5–5.1)
Sodium: 139 mEq/L (ref 135–145)

## 2010-10-05 LAB — DIFFERENTIAL
Basophils Absolute: 0 10*3/uL (ref 0.0–0.1)
Basophils Relative: 0 % (ref 0–1)
Eosinophils Absolute: 0.1 10*3/uL (ref 0.0–0.7)
Eosinophils Relative: 2 % (ref 0–5)
Monocytes Absolute: 0.5 10*3/uL (ref 0.1–1.0)
Neutro Abs: 3.9 10*3/uL (ref 1.7–7.7)

## 2010-10-05 LAB — URINE CULTURE

## 2010-10-05 LAB — CBC
HCT: 35.7 % — ABNORMAL LOW (ref 36.0–46.0)
MCH: 30.4 pg (ref 26.0–34.0)
MCHC: 34 g/dL (ref 30.0–36.0)
RDW: 16.4 % — ABNORMAL HIGH (ref 11.5–15.5)

## 2010-12-02 NOTE — Letter (Signed)
August 06, 2009    To Whom It May Concern   RE:  Karen Ochoa, Karen Ochoa  MRN:  [id number]  /  DOB:  04/06/1966   Ms. Goods remains under my medical care for a medical sleep problem.  This letter extends my letter of May 31, 2009 to indicate that she  continues to work with me.  She is being treated with medication.  Sleep  management remains a problem for her.  She is able to work 36 hours per  week.    Sincerely,      Clinton D. Maple Hudson, MD, Tonny Bollman, FACP  Electronically Signed    CDY/MedQ  DD: 08/06/2009  DT: 08/06/2009  Job #: [id number]

## 2010-12-05 NOTE — Procedures (Signed)
NAMEDIA, DONATE                ACCOUNT NO.:  [id number]   MEDICAL RECORD NO.:  [id number]          PATIENT TYPE:  INP   LOCATION:  A209                          FACILITY:  [facility]   PHYSICIAN:  Edward L. Juanetta Gosling, M.D.DATE OF BIRTH:  1965/11/09   DATE OF PROCEDURE:  04/21/2006  DATE OF DISCHARGE:                                EKG INTERPRETATION   The rhythm is sinus rhythm with a rate in the 70s.  There is an incomplete  right bundle branch block.  There is left ventricular hypertrophy with a  strain pattern, but the T wave abnormalities could also indicate ischemic.  Abnormal electrocardiogram.      Oneal Deputy. Juanetta Gosling, M.D.  Electronically Signed     ELH/MEDQ  D:  04/22/2006  T:  04/24/2006  Job:  [id number]

## 2010-12-05 NOTE — H&P (Signed)
NAMEEVELEN, VAZGUEZ                ACCOUNT NO.:  [id number]   MEDICAL RECORD NO.:  [id number]          PATIENT TYPE:  INP   LOCATION:  A209                          FACILITY:  [facility]   PHYSICIAN:  Madelin Rear. Sherwood Gambler, MD  DATE OF BIRTH:  [birth year]   DATE OF ADMISSION:  04/21/2006  DATE OF DISCHARGE:  LH                                HISTORY & PHYSICAL   CHIEF COMPLAINT:  Headache and chest pain.   HISTORY OF PRESENT ILLNESS:  The patient was sent over to the emergency  department with severe headache and chest pain.  Headache developed about 4-  5 days ago and persisted and got worse.  She subsequently developed chest  pain on the day of her evaluation, and she presented there for this.  She  denied any syncopal episode or shortness of breath. No cough, sputum, fever,  rigors or chills.  She has possible palpitations on and off over the past  week.  She has had no vasomotor flushing.  Her past medical history, she has  long-standing chronic hypertension since age 31.  She has been tried on  various regimens, but Norvasc has been stable for many years.  She is seen  in the office by my partner who continued her on Norvasc and was noted at  that time to have blood pressure of 160/90.  She was found on presentation  to have mildly elevated blood pressures.  Labetalol was given with prompt  reduction in her blood pressure as well as resolution in her chest pain  symptoms.  I was called to admit the patient for further treatment,  intervention and observation.   PAST MEDICAL HISTORY:  1. As above.  2. Hypertension.   SOCIAL HISTORY:  Positive cigarette smoker.  No alcohol or drug use.   FAMILY HISTORY:  Positive for coronary artery disease, hypertension, renal  failure.   REVIEW OF SYSTEMS:  As under HPI, all others negative.   PHYSICAL EXAMINATION:  SKIN:  Unremarkable.  HEENT:  __________.  NECK:  Supple.  CHEST:  Clear.  CARDIAC:  Regular rhythm with a 2/6 systolic ejection  murmur heard best at  the base of the heart.  No gallop or rub.  ABDOMEN:  Soft, no organomegaly or masses.  EXTREMITIES:  Without cyanosis, clubbing or edema.  NEUROLOGICAL:  Nonfocal.   LABORATORY DATA:  Normal CBC and normal electrolyte __________ .  Cardiac  markers were negative x2.  Chest x-ray pending at present and will be  reviewed when available.   IMPRESSION:  1. Accelerated hypertension, rule out renal vascular hypertension.  The      patient will be admitted for close blood pressure monitoring and p.r.n.      parenteral as well as oral medication intervention for rises of blood      pressure.  Due to the spiking nature of this and palpitations, will      rule out a pheochromocytoma, 24-hour urine's.  Cortisol, renin and      angiotensin levels will also be obtained.  The patient will be started  on ACE inhibitor added to her calcium channel blocker.  Will monitor      for response.  Will add additional medications as indicated or adjust      doses of same.  2. Cephalgia secondary to hypertension, resolved at present.  Do not see      indication of CT at present, but if she develops any more neurologic      sequelae, will go ahead and get a CT.  Anticipate a CT angiogram as      under above discussion.      Madelin Rear. Sherwood Gambler, MD  Electronically Signed     LJF/MEDQ  D:  04/21/2006  T:  04/22/2006  Job:  [id number]

## 2010-12-05 NOTE — Procedures (Signed)
Karen Ochoa, Karen Ochoa                ACCOUNT NO.:  [id number]   MEDICAL RECORD NO.:  [id number]          PATIENT TYPE:  INP   LOCATION:  A209                          FACILITY:  [facility]   PHYSICIAN:  Dani Gobble, MD       DATE OF BIRTH:  [birth year]   DATE OF PROCEDURE:  04/22/2006  DATE OF DISCHARGE:                                  ECHOCARDIOGRAM   INDICATIONS:  A 45 year old female with a past medical history of  hypertension who was experiencing chest pain and found to have murmur who  was referred for evaluation of LV function.   The aorta measures normally at 3.1 cm.   Left atrium measures normally at 3.3 cm.   The intraventricular septum and posterior wall are moderately thickened,  measured at 1.6 cm and 1.5 cm, respectively.   The aortic valve is trileaflet with normal leaflet excursion.  No aortic  insufficiency is noted.  Doppler interrogation of the aortic valve is within  normal limits.   The mitral valve also appears structurally normal.  No mitral valve prolapse  is noted.  Trivial to mild mitral regurgitation is noted.  Doppler  interrogation of mitral valve is within normal limits.  There is a tiny  mobile echodensity just below the mitral valve in the left ventricle that  most likely represents a lax chordae.  There is mild systolic anterior  motion of the chordae tendineae.   The pulmonic valve was incompletely visualized.   The tricuspid valve appears grossly structurally normal with trivial  tricuspid regurgitation noted.   The left ventricle is normal in size with LV IDD measured 4.1 cm LV IC is  measured at 2.4 cm.  Overall left systolic function is normal.  No regional  wall motion abnormalities were noted.  There is a suggestion of diastolic  dysfunction by the inflow signal.   Right atrium and right ventricle are normal in size.  Right ventricular  systolic function is normal.   There is no pericardial effusion noted.   IMPRESSION:  1. Moderate  concentric left ventricular hypertrophy.  2. Tiny mobile echodensity just below the mitral valve apparatus in the      left ventricle which is most consistent with lax chordae.      Additionally, there is mild systolic anterior motion of the chordae      tendineae.  3. Trace to mild mitral regurgitation.  4. Trivial tricuspid regurgitation.  5. Normal left ventricular size and systolic function without regional      wall motion abnormality.  6. Presence of diastolic dysfunction is inferred from pulse wave Doppler      across the mitral valve.           ______________________________  Dani Gobble, MD     AB/MEDQ  D:  04/22/2006  T:  04/23/2006  Job:  [id number]   cc:   Madelin Rear. Sherwood Gambler, MD  Fax: [phone]

## 2010-12-05 NOTE — Op Note (Signed)
NAME:  Karen Ochoa, Karen Ochoa                          ACCOUNT NO.:  [id number]   MEDICAL RECORD NO.:  [id number]                   PATIENT TYPE:  AMB   LOCATION:  SDC                                  FACILITY:  [facility]   PHYSICIAN:  Hal Morales, M.D.             DATE OF BIRTH:  Jul 29, 1965   DATE OF PROCEDURE:  02/19/2003  DATE OF DISCHARGE:                                 OPERATIVE REPORT   PREOPERATIVE DIAGNOSES:  1. Intrauterine pregnancy with missed spontaneous abortion at eight weeks.  2. Uterine fibroids.   POSTOPERATIVE DIAGNOSES:  1. Intrauterine pregnancy with missed spontaneous abortion at eight weeks.  2. Uterine fibroids.   OPERATION:  Dilatation and evacuation with ultrasound guidance.   SURGEON:  Hal Morales, M.D.   ANESTHESIA:  Monitored anesthesia care and local.   ESTIMATED BLOOD LOSS:  Less than 100 mL.   COMPLICATIONS:  Inability to access the endometrial cavity until ultrasound  guidance was obtained.   SPECIMENS TO PATHOLOGY:  Products of conception.   FINDINGS:  The uterus was enlarged to approximately 10-12 weeks' size and  irregular.  A moderate amount of products of conception were obtained at the  time of evacuation.  Large uterine fibroids could be seen on ultrasound and  were somewhat obstructive to the entry into the endometrial cavity.  Blood  type was B positive.   DESCRIPTION OF PROCEDURE:  A discussion was held with the patient concerning  options for management of her missed abortion.  She opted for dilatation and  evacuation.  A discussion of the procedure, indications, and risks was  undertaken and the patient wished to proceed.  She was taken to the  operating room after appropriate identification and placed on the operating  table.  After placement of equipment for monitored anesthesia care, she was  placed in the lithotomy position.  The perineum and vagina were prepped with  multiple layers of Betadine and the bladder emptied with  a red Robinson  catheter.  The perineum was draped as a sterile field.  A single-tooth  tenaculum was placed on the anterior cervix and a paracervical block  achieved with a total of 10 mL of 2% Xylocaine at the 5 and 7 o'clock  positions.  The cervix was then dilated to accommodate a 27 Pratt dilator.  A #8 suction curette was initially chosen and an attempt made to enter the  endometrial cavity.  The curette could not be placed any further than  approximately 6 cm into the uterus.  A uterine sound was used to sound the  uterus to 15 cm and again numerous attempts made to introduce curettes of  various sizes into the endometrial cavity.  Once ultrasound assistance  became available, it was clear that a large uterine fibroid was in the  position to obstruct entry of the 8 mm curette, which was rigid, into the  endometrial cavity.  A flexible curette was obtained and attempted.  Finally  a #3.0 neonatal endotracheal tube was used to access the endometrial cavity,  and this was documented with ultrasound.  A small amount of tissue was  obtained via vacuum suction through that endotracheal tube.  However,  adequate removal of tissue could not be achieved and that tube was replaced  with a 4.5 pediatric endotracheal tube with slightly more products of  conception being able to be suctioned through that.  Finally a flexible #6  Vacurette was obtained and could be placed into the endometrial cavity under  direct visualization, followed by a #7 Vacurette to finally allow complete  evacuation of the endometrial cavity under ultrasound evaluation.  Because  of the amount of instrumentation that was required for the evacuation, the  patient was given Clindamycin 900 mg IV during her procedure.  Once the  procedure was complete and it had been documented on ultrasound that all  products of conception had been removed, the bladder was emptied with a red  Robinson catheter and the patient taken from the  operating room to the  recovery room in satisfactory condition having tolerated the procedure well,  with sponge and instrument counts correct.   SPECIMENS TO PATHOLOGY:  Products of conception.                                                Hal Morales, M.D.    VPH/MEDQ  D:  02/19/2003  T:  02/20/2003  Job:  [id number]

## 2010-12-05 NOTE — Consult Note (Signed)
Karen Ochoa, GIORGIO                ACCOUNT NO.:  [id number]   MEDICAL RECORD NO.:  [id number]          PATIENT TYPE:  INP   LOCATION:  A209                          FACILITY:  [facility]   PHYSICIAN:  Kofi A. Gerilyn Pilgrim, M.D. DATE OF BIRTH:  January 17, 1966   DATE OF CONSULTATION:  04/25/2006  DATE OF DISCHARGE:  04/26/2006                                   CONSULTATION   REASON FOR CONSULTATION:  Headaches.   The patient is a 45 year old with a longstanding history of poorly  controlled hypertension.  She has had hypertension since late teenage years.  She is on a current 4-drug regimen with still significant difficulty  controlling her blood pressure.  The patient presented with severe headaches  and some chest discomfort.  She grades the severity of the headache 8/10 and  it is located in the top of the head but essentially involves the entire  head.   PAST MEDICAL HISTORY:  Hypertension.   REVIEW OF SYSTEMS:  Stated in the history of present illness, otherwise  unrevealing.   FAMILY HISTORY:  Significant for coronary disease, hypertensive renal  failure.   SOCIAL HISTORY:  Smokes cigarettes.  No alcohol or illicit drug use.   PHYSICAL EXAM:  She is a pleasant lady in no acute distress.  Blood pressure 160/90.  HEENT evaluation shows that the neck is supple.  Head is normocephalic,  atraumatic.  ABDOMEN:  Soft.  EXTREMITIES:  No edema.  MENTATION:  She is awake and alert.  Speech, language, and cognition intact.  CRANIAL NERVES:  Shows pupils are 4 mm and reactive.  Visual fields were  full.  Extraocular movements are intact.  Funduscopic examination shows flat disks, although no clear spontaneous  venous pulsations are noted.  Facial muscle strength is symmetric.  Tongue  protrusion is midline.  Uvula is midline.  Shoulder strength is normal.  MOTOR:  Shows normal tone, bulk, and strength.  Baseline coordination  intact.  Reflexes are normal and symmetric.  Plantar reflexes are  flexor.  Sensation normal to light touch and gait is normal.   Brain MRI scan is reviewed and shows multiple white matter hyperintensities  in the periventricular and deep white matter areas.  I see nothing in the  corpus callosum or no clear Dawson's finger.  She does have, however,  hyperintensities that purchase the corpus callosum especially involving the  posterior left area.  There is a lucency seen on T1 involving the right  parietal area.   ASSESSMENT:  Extensive white matter hyperintensities.  Given the  longstanding history of poorly controlled hypertension, this is the most  likely etiology.  The patient did have, however, a transient left-sided  weakness which apparently lasted for several hours, probably about 20 hours  or so.  This may be explained by the lucency noted in the right parietal  region which could represent either a lacunar infarction or a black hole  from multiple sclerosis.  Again, however, I believe given the current  situation, the patient's problem is most likely from a long-term sequelae of  poorly treated hypertension.  RECOMMENDATIONS:  I think we will need to repeat her scan in 6 months.  By  that time, we will assess if we need to do any further additional workup  such as a lumbar spinal tap.      Kofi A. Gerilyn Pilgrim, M.D.  Electronically Signed     KAD/MEDQ  D:  04/29/2006  T:  04/29/2006  Job:  [id number]

## 2010-12-05 NOTE — Discharge Summary (Signed)
NAMENAJE, RICE                ACCOUNT NO.:  [id number]   MEDICAL RECORD NO.:  [id number]          PATIENT TYPE:  INP   LOCATION:  A209                          FACILITY:  [facility]   PHYSICIAN:  Madelin Rear. Sherwood Gambler, MD  DATE OF BIRTH:  Apr 12, 1966   DATE OF ADMISSION:  04/21/2006  DATE OF DISCHARGE:  10/08/2007LH                                 DISCHARGE SUMMARY   DISCHARGE MEDICATIONS:  1. Norvasc 10 mg daily.  2. Vasotec 20 mg p.o. daily.  3. Metoprolol 50 mg p.o. b.i.d.  4. Hydrochlorothiazide 12.5 mg p.o. daily.   DISCHARGE DIAGNOSIS:  Accelerated hypertension complicated by encephalgia.   SUMMARY:  The patient was admitted after being sent over to the emergency  department with severe headache, chest discomfort.  She was noted to be  markedly hypertensive in the office.  Her symptoms remitted with reduction  in blood pressure.  Used alpha blockade successfully with clonidine.  Adjustments of her medications ensued.  She was also seen in consultation by  cardiology, who concurred with management.  Subsequent carotid ultrasound  showed an estimated stenosis left and right of 0-50 and 0-50, moderate  plaque formation.  For this reason she was placed on antiplatelet therapy.  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  She will follow up in the  office post discharge.      Madelin Rear. Sherwood Gambler, MD  Electronically Signed     LJF/MEDQ  D:  05/10/2006  T:  05/10/2006  Job:  [id number]

## 2010-12-05 NOTE — Letter (Signed)
July 24, 2009    [address]  [place], [place] [address]   RE:  [name], [name]  MRN:  [id number]  /  DOB:  1966/03/04   To Whom It May Concern:   [name] is under my medical care for significant medical sleep problem  which we are trying to improve.  Meanwhile, she needs a restricted work  day, limiting work time to 5-6 hours maximum per day.  This continues  from initial designation on June 03, 2009, for a duration of 3  months/90 days from today's date.    Sincerely,      Clinton D. Maple Hudson, MD, Karen Ochoa, FACP  Electronically Signed    CDY/MedQ  DD: 07/24/2009  DT: 07/25/2009  Job #: [id number]

## 2010-12-05 NOTE — Op Note (Signed)
[facility]  Patient:    Karen Ochoa, Karen Ochoa Visit Number: [id number] MRN: [id number]          Service Type: Attending:  Pershing Cox, M.D. Dictated by:   Pershing Cox, M.D. Proc. Date: 07/12/01   CC:         Pershing Cox, M.D.  Dr. Okey Dupre, gynecologist in [facility]   Operative Report  PREOPERATIVE DIAGNOSIS:       Retained products of conception.  POSTOPERATIVE DIAGNOSIS:      Retained products of conception.  PROCEDURE:                    1. Examination under anesthesia.                               2. Dilatation and curettage using suction                                  and curetting.  SURGEON:                      Pershing Cox, M.D.  ANESTHESIA:                   MAC and Marcaine paracervical block.  INDICATIONS:                  The patient is a 45 year old, gravida 3, para 33, African-American female who is status post ELAB in [place] on July 05, 2001. She was given a patch for contraception and placed it last night then began having heavy bleeding in the evening and came to [facility] Emergency Room. Hemoglobin was 10. She continued to pass large clots and a sonogram was performed which showed multiple uterine fibroids and what appeared to be retained products of conception. She was transferred to [facility] for evaluation. I reviewed her sonogram and was convinced that there were products of conception in her uterus and she was scheduled for surgery. In both emergency rooms, she had a significant elevation of both systolic and diastolic blood pressures. She has been prohibited from using oral contraceptives in the past because of her history of hypertension but has not been taking Vasotec because she is unemployed. In the emergency room, she was given labetalol with good response of her blood pressure. She did not require any labetalol during our operative procedure.  OPERATIVE FINDINGS:  Uterus  is 12 weeks in size with a large fibroid arising from the left side of the uterus making evaluation of the adnexa impossible. The uterine cavity itself sounds to 10 cm. 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 This makes it difficult to pass the suction curet and in fact, may be the reason she had retained products of conception.  DESCRIPTION OF PROCEDURE:     Karen Ochoa was brought to the operating room with an IV in place. She received a gram of Ancef while on the operating room table. IV sedation was administered and she was placed into Allen stirrups. Hibiclens was used to prep her lower abdomen, perineum and vagina. Red rubber catheter was used to empty her bladder of approximately 100 cc of clear urine.  Bivalve speculum was inserted into the vagina. Marcaine was used to anesthetize the anterior cervix  which was then grasped with a single-tooth tenaculum. A uterine sound passed to a depth of 10 cm. This was in the slightly anteflexed position. Serial Pratt dilators were used to dilate the cervix to admit a #10 curved curet. The curet was passed but would not go further than 8 cm. With this in mind, I used an 8 curved and was able to get into 10 cm depth. A second tenaculum was placed on the posterior cervix so that the walls of the uterus could be opposed to create more suction. At this point, I was able to retrieve what appeared to be consistent with products of conception. Four to five passes were necessary to retrieve all of the material. After I completed this, I used a small sharp curet to go into the uterine cavity and sample for additional tissue. A very small amount of tissue was obtained by the curetting. The 8 curved was reintroduced and rotated several times. No additional tissue was found. There was only a modest amount of bleeding at the end of the procedure. The patient tolerated the procedure well. Again, the  blood pressure was stable during the procedure. Dictated by:   Pershing Cox, M.D. Attending:  Pershing Cox, M.D. DD:  07/12/01 TD:  07/13/01 Job: [id number]

## 2010-12-05 NOTE — Procedures (Signed)
Karen Ochoa, Karen Ochoa                ACCOUNT NO.:  [id number]   MEDICAL RECORD NO.:  [id number]          PATIENT TYPE:  INP   LOCATION:  A209                          FACILITY:  [facility]   PHYSICIAN:  Edward L. Juanetta Gosling, M.D.DATE OF BIRTH:  March 13, 1966   DATE OF PROCEDURE:  04/22/2006  DATE OF DISCHARGE:                                EKG INTERPRETATION   The rhythm is normal sinus rhythm with a rate in the 90s.  There is biatrial  enlargement.  There is left ventricular hypertrophy.  ST-T wave abnormality  is seen which could indicate ischemia and clinical correlation is suggested.  Abnormal electrocardiogram.      Oneal Deputy. Juanetta Gosling, M.D.  Electronically Signed     ELH/MEDQ  D:  04/25/2006  T:  04/26/2006  Job:  [id number]

## 2010-12-05 NOTE — Procedures (Signed)
Karen Ochoa, Karen Ochoa                ACCOUNT NO.:  [id number]   MEDICAL RECORD NO.:  [id number]          PATIENT TYPE:  INP   LOCATION:  A209                          FACILITY:  [facility]   PHYSICIAN:  Edward L. Juanetta Gosling, M.D.DATE OF BIRTH:  June 04, 1966   DATE OF PROCEDURE:  04/24/2006  DATE OF DISCHARGE:                                EKG INTERPRETATION   The rhythm is sinus rhythm with a rate in the 60s.  There is left  ventricular hypertrophy with strain.  ST-T wave abnormality is seen which  could indicate ischemia.  QT interval is prolonged.  Abnormal  electrocardiogram.      Oneal Deputy. Juanetta Gosling, M.D.  Electronically Signed     ELH/MEDQ  D:  04/25/2006  T:  04/26/2006  Job:  [id number]

## 2010-12-05 NOTE — Letter (Signed)
May 31, 2009     RE:  EMILA, STEINHAUSER  MRN:  [id number]  /  DOB:  January 14, 1966   To Whom It May Concern:   Ms. Briant Sites is under my medical care for a significant medical sleep  problem which we are trying to improve.  Meanwhile, she needs a  restricted work day, limiting work time to 5-6 hours maximum per day  effective June 03, 2009, for a duration of 30 days.    Sincerely,      Clinton D. Maple Hudson, MD, Tonny Bollman, FACP  Electronically Signed    CDY/MedQ  DD: 05/31/2009  DT: 05/31/2009  Job #: [id number]
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
## 2011-01-12 ENCOUNTER — Emergency Department (HOSPITAL_COMMUNITY)
Admission: EM | Admit: 2011-01-12 | Discharge: 2011-01-12 | Disposition: A | Discharge status: Home / Self Care | Payer: Self-pay | Attending: Emergency Medicine | Admitting: Emergency Medicine

## 2011-01-12 DIAGNOSIS — H5710 Ocular pain, unspecified eye: Secondary | ICD-10-CM | POA: Insufficient documentation

## 2011-01-12 DIAGNOSIS — Z8673 Personal history of transient ischemic attack (TIA), and cerebral infarction without residual deficits: Secondary | ICD-10-CM | POA: Insufficient documentation

## 2011-01-12 DIAGNOSIS — T783XXA Angioneurotic edema, initial encounter: Secondary | ICD-10-CM | POA: Insufficient documentation

## 2011-01-12 DIAGNOSIS — X58XXXA Exposure to other specified factors, initial encounter: Secondary | ICD-10-CM | POA: Insufficient documentation

## 2011-01-12 DIAGNOSIS — R220 Localized swelling, mass and lump, head: Secondary | ICD-10-CM | POA: Insufficient documentation

## 2011-01-12 DIAGNOSIS — H209 Unspecified iridocyclitis: Secondary | ICD-10-CM | POA: Insufficient documentation

## 2011-01-12 DIAGNOSIS — R221 Localized swelling, mass and lump, neck: Secondary | ICD-10-CM | POA: Insufficient documentation

## 2011-01-12 DIAGNOSIS — I10 Essential (primary) hypertension: Secondary | ICD-10-CM | POA: Insufficient documentation

## 2011-04-13 LAB — BASIC METABOLIC PANEL
BUN: 14
CO2: 25
Calcium: 8.8
Chloride: 104
Creatinine, Ser: 1.01
GFR calc Af Amer: >60
GFR calc non Af Amer: 60 — ABNORMAL LOW
Glucose, Bld: 92
Potassium: 3.6
Sodium: 136

## 2011-04-16 LAB — POCT PREGNANCY, URINE
Operator id: 282151
Preg Test, Ur: NEGATIVE

## 2011-04-16 LAB — CULTURE, ROUTINE-ABSCESS

## 2011-04-29 LAB — DIFFERENTIAL
Basophils Absolute: 0
Basophils Relative: 0
Eosinophils Absolute: 0.1
Monocytes Relative: 8
Neutro Abs: 4.4
Neutrophils Relative %: 61

## 2011-04-29 LAB — BASIC METABOLIC PANEL
BUN: 12
CO2: 27
Calcium: 8.9
Chloride: 98
Creatinine, Ser: 1.18
GFR calc Af Amer: >60

## 2011-04-29 LAB — POCT CARDIAC MARKERS
Myoglobin, poc: 44.4
Myoglobin, poc: 49.8
Operator id: 4533
Operator id: 4533
Operator id: 4761
Troponin i, poc: <0.05
Troponin i, poc: <0.05

## 2011-04-29 LAB — CBC
MCHC: 34.3
MCV: 90.7
Platelets: 273
RBC: 4.34
RDW: 15.8 — ABNORMAL HIGH

## 2011-04-29 LAB — D-DIMER, QUANTITATIVE: D-Dimer, Quant: <0.22

## 2011-04-30 LAB — POCT PREGNANCY, URINE: Preg Test, Ur: NEGATIVE

## 2011-08-03 ENCOUNTER — Encounter (HOSPITAL_COMMUNITY): Payer: Self-pay | Admitting: *Deleted

## 2011-08-03 ENCOUNTER — Inpatient Hospital Stay (HOSPITAL_COMMUNITY)
Admission: AD | Admit: 2011-08-03 | Discharge: 2011-08-03 | Disposition: A | Discharge status: Home / Self Care | Payer: Self-pay | Source: Ambulatory Visit | Attending: Obstetrics & Gynecology | Admitting: Obstetrics & Gynecology

## 2011-08-03 DIAGNOSIS — B9689 Other specified bacterial agents as the cause of diseases classified elsewhere: Secondary | ICD-10-CM | POA: Insufficient documentation

## 2011-08-03 DIAGNOSIS — A499 Bacterial infection, unspecified: Secondary | ICD-10-CM | POA: Insufficient documentation

## 2011-08-03 DIAGNOSIS — N760 Acute vaginitis: Secondary | ICD-10-CM | POA: Insufficient documentation

## 2011-08-03 DIAGNOSIS — T192XXA Foreign body in vulva and vagina, initial encounter: Secondary | ICD-10-CM | POA: Insufficient documentation

## 2011-08-03 DIAGNOSIS — I10 Essential (primary) hypertension: Secondary | ICD-10-CM | POA: Insufficient documentation

## 2011-08-03 DIAGNOSIS — R109 Unspecified abdominal pain: Secondary | ICD-10-CM | POA: Insufficient documentation

## 2011-08-03 HISTORY — DX: Benign neoplasm of connective and other soft tissue, unspecified: D21.9

## 2011-08-03 HISTORY — DX: Essential (primary) hypertension: I10

## 2011-08-03 LAB — WET PREP, GENITAL: Trich, Wet Prep: NONE SEEN

## 2011-08-03 LAB — CBC
MCH: 31.1 pg (ref 26.0–34.0)
MCHC: 33.9 g/dL (ref 30.0–36.0)
Platelets: 221 10*3/uL (ref 150–400)
RBC: 4.05 MIL/uL (ref 3.87–5.11)

## 2011-08-03 LAB — DIFFERENTIAL
Basophils Absolute: 0 10*3/uL (ref 0.0–0.1)
Basophils Relative: 1 % (ref 0–1)
Lymphocytes Relative: 31 % (ref 12–46)
Monocytes Absolute: 0.7 10*3/uL (ref 0.1–1.0)
Monocytes Relative: 9 % (ref 3–12)
Neutro Abs: 4.3 10*3/uL (ref 1.7–7.7)
Neutrophils Relative %: 57 % (ref 43–77)

## 2011-08-03 MED ORDER — AMLODIPINE BESYLATE 5 MG PO TABS: 5.0000 mg | ORAL_TABLET | Freq: Every day | ORAL | Status: DC

## 2011-08-03 MED ORDER — METRONIDAZOLE 500 MG PO TABS: 500.0000 mg | ORAL_TABLET | Freq: Two times a day (BID) | ORAL | Status: DC

## 2011-08-03 MED ORDER — KETOROLAC TROMETHAMINE 60 MG/2ML IM SOLN
60.0000 mg | Freq: Once | INTRAMUSCULAR | Status: AC
  Administered 2011-08-03: 60 mg via INTRAMUSCULAR
  Filled 2011-08-03: qty 2

## 2011-08-03 NOTE — ED Provider Notes (Signed)
History     CSN: [id number]  Arrival date & time 08/03/11  1644   None     Chief Complaint  Patient presents with  . Vaginal Bleeding    Patient is a 46 y.o. female presenting with vaginal bleeding.  Vaginal Bleeding Associated symptoms include abdominal pain. Pertinent negatives include no chills, congestion, coughing, diaphoresis, fatigue, fever, headaches, myalgias, nausea, neck pain, numbness, rash, sore throat, vomiting or weakness.   Karen Ochoa is a 46 y.o. female who presents to [facility]U for abdominal pain and vaginal bleeding that started Jan 3rd. Comes and goes. Last night light with dark brown discharge and bad odor. Pain last night 7/10 and continues today. Took motrin for pain and had some relief but then returns.  Last pap smear 3 years ago and was normal. No history of pelvic infection. The history was provided by the patient.  Past Medical History  Diagnosis Date  . Hypertension   . Fibroids     Past Surgical History  Procedure Date  . Wisdom tooth extraction     History reviewed. No pertinent family history.  History  Substance Use Topics  . Smoking status: Current Everyday Smoker -- 0.5 packs/day  . Smokeless tobacco: Not on file  . Alcohol Use: No    OB History    Grav Para Term Preterm Abortions TAB SAB Ect Mult Living   4 0 0 0 4 0 4 0 0 0       Review of Systems  Constitutional: Negative for fever, chills, diaphoresis, appetite change, fatigue and unexpected weight change.  HENT: Negative for ear pain, congestion, sore throat, facial swelling, neck pain, neck stiffness, dental problem and sinus pressure.   Eyes: Negative for photophobia, pain and discharge.  Respiratory: Negative for cough, chest tightness and wheezing.   Cardiovascular: Negative.   Gastrointestinal: Positive for abdominal pain. Negative for nausea, vomiting, diarrhea, constipation and abdominal distention.  Genitourinary: Positive for vaginal discharge and pelvic pain. Negative  for dysuria, urgency, frequency, flank pain and difficulty urinating.  Musculoskeletal: Negative for myalgias, back pain and gait problem.  Skin: Negative for color change and rash.  Neurological: Negative for dizziness, speech difficulty, weakness, light-headedness, numbness and headaches.  Psychiatric/Behavioral: Negative for confusion and agitation. The patient is not nervous/anxious.     Allergies  Penicillins and Sulfonamide derivatives  Home Medications  No current outpatient prescriptions on file.  BP 163/102  Pulse 95  Temp(Src) 97.9 F (36.6 C) (Oral)  Resp 20  Ht 5\' 7"  (1.702 m)  Wt 186 lb 2 oz (84.426 kg)  BMI 29.15 kg/m2  LMP 07/22/2011  Physical Exam  Nursing note and vitals reviewed. Constitutional: She is oriented to person, place, and time. She appears well-developed and well-nourished.       Uncomfortable appearing. Blood pressure elevated 163/102.  HENT:  Head: Normocephalic.  Eyes: EOM are normal.  Neck: Neck supple.  Cardiovascular: Normal rate.   Pulmonary/Chest: Effort normal.  Abdominal: Soft. There is no tenderness.  Musculoskeletal: Normal range of motion.  Neurological: She is alert and oriented to person, place, and time. No cranial nerve deficit.  Skin: Skin is warm and dry.  Psychiatric: Her behavior is normal. Judgment and thought content normal.   Toradol 60 mg IM now for pain Recheck blood pressure  ED Course: Care turned over to Roney Marion, CNM @ 20:06   Procedures  Kerrie Buffalo, NP 08/03/11 2008  Pelvic Exam - after speculum inserted tampon seen in the vaginal vault; tampon  removed with ring forceps without difficulty; malodorous; cervix with some areas of spotting; dark brown discharge seen;  Negative cervical motion tenderness.   MDM   Results for orders placed during the hospital encounter of 08/03/11 (from the past 24 hour(s))  POCT PREGNANCY, URINE     Status: Normal   Collection Time   08/03/11  5:50 PM      Component Value  Range   Preg Test, Ur NEGATIVE    CBC     Status: Normal   Collection Time   08/03/11  7:19 PM      Component Value Range   WBC 7.5  4.0 - 10.5 (K/uL)   RBC 4.05  3.87 - 5.11 (MIL/uL)   Hemoglobin 12.6  12.0 - 15.0 (g/dL)   HCT 98.1  19.1 - 47.8 (%)   MCV 91.9  78.0 - 100.0 (fL)   MCH 31.1  26.0 - 34.0 (pg)   MCHC 33.9  30.0 - 36.0 (g/dL)   RDW 29.5  62.1 - 30.8 (%)   Platelets 221  150 - 400 (K/uL)  DIFFERENTIAL     Status: Normal   Collection Time   08/03/11  7:19 PM      Component Value Range   Neutrophils Relative 57  43 - 77 (%)   Neutro Abs 4.3  1.7 - 7.7 (K/uL)   Lymphocytes Relative 31  12 - 46 (%)   Lymphs Abs 2.3  0.7 - 4.0 (K/uL)   Monocytes Relative 9  3 - 12 (%)   Monocytes Absolute 0.7  0.1 - 1.0 (K/uL)   Eosinophils Relative 2  0 - 5 (%)   Eosinophils Absolute 0.2  0.0 - 0.7 (K/uL)   Basophils Relative 1  0 - 1 (%)   Basophils Absolute 0.0  0.0 - 0.1 (K/uL)  WET PREP, GENITAL     Status: Abnormal   Collection Time   08/03/11  8:42 PM      Component Value Range   Yeast, Wet Prep NONE SEEN  NONE SEEN    Trich, Wet Prep NONE SEEN  NONE SEEN    Clue Cells, Wet Prep RARE (*) NONE SEEN    WBC, Wet Prep HPF POC FEW (*) NONE SEEN     A:   Uncontrolled Hypertension - Asymptomatic  Retained Tampon  Bacterial Vaginosis  P:   RX Norvasc 5 mg QD Flagyl 500 mg BID x 7 days Schedule appointment with PCP for follow-up GC/CT pending  [facility]

## 2011-08-03 NOTE — Progress Notes (Signed)
Pt has prescription for BP meds that she does not take.  Refill given to pt.

## 2011-08-03 NOTE — Progress Notes (Signed)
SSE per Justus Memory, CNM.  Wet prep and cultures collected.  Ring forceps used to remove tampon that pt did not know was in vagina.  VE done.

## 2011-08-03 NOTE — Progress Notes (Signed)
Pt states has been bleeding x1.5weeks, hx fibroids, pain gradually progressing. Notes brownish/black odorous vaginal d/c.

## 2011-08-04 LAB — GC/CHLAMYDIA PROBE AMP, GENITAL: GC Probe Amp, Genital: NEGATIVE

## 2011-08-04 NOTE — ED Provider Notes (Signed)
Attestation of Attending Supervision of Advanced Practitioner: Evaluation and management procedures were performed by the PA/NP/CNM/OB Fellow under my supervision/collaboration. Chart reviewed, and agree with management and plan.  Jaynie Collins, M.D. 08/04/2011 7:26 AM

## 2011-08-05 ENCOUNTER — Encounter (HOSPITAL_COMMUNITY): Payer: Self-pay

## 2011-08-05 ENCOUNTER — Inpatient Hospital Stay (HOSPITAL_COMMUNITY)
Admission: AD | Admit: 2011-08-05 | Discharge: 2011-08-05 | Disposition: A | Discharge status: Home / Self Care | Payer: Self-pay | Source: Ambulatory Visit | Attending: Obstetrics & Gynecology | Admitting: Obstetrics & Gynecology

## 2011-08-05 DIAGNOSIS — T373X5A Adverse effect of other antiprotozoal drugs, initial encounter: Secondary | ICD-10-CM | POA: Insufficient documentation

## 2011-08-05 DIAGNOSIS — I10 Essential (primary) hypertension: Secondary | ICD-10-CM | POA: Insufficient documentation

## 2011-08-05 DIAGNOSIS — R21 Rash and other nonspecific skin eruption: Secondary | ICD-10-CM

## 2011-08-05 DIAGNOSIS — L293 Anogenital pruritus, unspecified: Secondary | ICD-10-CM | POA: Insufficient documentation

## 2011-08-05 HISTORY — DX: Personal history of Methicillin resistant Staphylococcus aureus infection: Z86.14

## 2011-08-05 HISTORY — DX: Insomnia due to medical condition: G47.01

## 2011-08-05 MED ORDER — METHYLPREDNISOLONE 4 MG PO KIT: PACK | ORAL | Status: AC

## 2011-08-05 MED ORDER — FLUCONAZOLE 150 MG PO TABS: 150.0000 mg | ORAL_TABLET | Freq: Once | ORAL | Status: AC

## 2011-08-05 MED ORDER — NYSTATIN-TRIAMCINOLONE 100000-0.1 UNIT/GM-% EX CREA: TOPICAL_CREAM | CUTANEOUS | Status: AC

## 2011-08-05 NOTE — Progress Notes (Signed)
Pt states took 2 flagyl tablets and had burning in groin, now has blisters bilaterally.

## 2011-08-05 NOTE — ED Provider Notes (Signed)
History   Pt presents today c/o an allergic reaction to Flagyl. She states she was given Flagyl on Monday and took 2 pills as directed on Tuesday. She began to have burning and itching in her groin area and noticed a rash in the creases of her thighs. She also noticed some blistering. She denies fever, increased vag dc, or any other sx. She states she did but Vaseline on the areas which helped a little.   Chief Complaint  Patient presents with  . Rash   HPI  OB History    Grav Para Term Preterm Abortions TAB SAB Ect Mult Living   4 0 0 0 4 0 4 0 0 0       Past Medical History  Diagnosis Date  . Hypertension   . Fibroids   . Hx MRSA infection   . Insomnia due to medical condition   . Hx-TIA (transient ischemic attack)     permanent insomnia    Past Surgical History  Procedure Date  . Wisdom tooth extraction     Family History  Problem Relation Age of Onset  . Anesthesia problems Neg Hx     History  Substance Use Topics  . Smoking status: Current Everyday Smoker -- 0.5 packs/day  . Smokeless tobacco: Never Used  . Alcohol Use: No    Allergies:  Allergies  Allergen Reactions  . Flagyl (Metronidazole) Swelling and Other (See Comments)    Skin blisters  . Penicillins     REACTION: raash, lip/throat edema  . Sulfonamide Derivatives     REACTION: rash, lip/throat edema    Prescriptions prior to admission  Medication Sig Dispense Refill  . amLODipine (NORVASC) 5 MG tablet Take 5 mg by mouth daily.      Marland Kitchen ibuprofen (ADVIL,MOTRIN) 200 MG tablet Take 800 mg by mouth every 6 (six) hours as needed. Patient took this medication for pain.      . Ibuprofen-Diphenhydramine Cit (ADVIL PM PO) Take 2 tablets by mouth daily as needed. Patient took this medication for sleep.        Review of Systems  Constitutional: Negative for fever, chills and malaise/fatigue.  Eyes: Negative for blurred vision and double vision.  Cardiovascular: Negative for chest pain and palpitations.    Gastrointestinal: Negative for nausea, vomiting, abdominal pain, diarrhea and constipation.  Genitourinary: Negative for dysuria, urgency, frequency and hematuria.  Skin: Positive for itching and rash.  Neurological: Negative for dizziness and headaches.  Psychiatric/Behavioral: Negative for depression and suicidal ideas.   Physical Exam   Blood pressure 177/109, temperature 98.4 F (36.9 C), temperature source Oral, resp. rate 16, last menstrual period 07/22/2011, SpO2 99.00%.  Physical Exam  Nursing note and vitals reviewed. Constitutional: She is oriented to person, place, and time. She appears well-developed and well-nourished. No distress.  HENT:  Head: Normocephalic and atraumatic.  Eyes: EOM are normal. Pupils are equal, round, and reactive to light.  GI: Soft. She exhibits no distension. There is no tenderness. There is no rebound and no guarding.  Genitourinary: No vaginal discharge found.  Neurological: She is alert and oriented to person, place, and time.  Skin: Skin is warm and dry. Rash noted. Rash is vesicular. She is not diaphoretic. There is erythema.     Psychiatric: She has a normal mood and affect. Her behavior is normal. Judgment and thought content normal.    MAU Course  Procedures    Assessment and Plan  Rash: doubt allergic reaction to systemic Flagyl as rash is  localized to inguinal area. Possible yeast. Will tx with medrol dose pack, mycollog II cream, and diflucan. She will f/u with PCP. Discussed diet, activity, risks, and precautions.  HTN: BP stable. She is to f/u with her PCP. Gave precautions.  Clinton Gallant. Fernie Grimm III, DrHSc, MPAS, PA-C  08/05/2011, 8:06 AM   Henrietta Hoover, PA 08/05/11 [phone]

## 2011-08-05 NOTE — Progress Notes (Signed)
Patient states she was treated for BV on 1-14 and is having a bad, painful rash on the perineal area.

## 2012-01-08 ENCOUNTER — Encounter (HOSPITAL_COMMUNITY): Payer: Self-pay

## 2012-01-08 ENCOUNTER — Emergency Department (INDEPENDENT_AMBULATORY_CARE_PROVIDER_SITE_OTHER)
Admission: EM | Admit: 2012-01-08 | Discharge: 2012-01-08 | Disposition: A | Discharge status: Home / Self Care | Payer: Self-pay | Source: Home / Self Care | Attending: Emergency Medicine | Admitting: Emergency Medicine

## 2012-01-08 DIAGNOSIS — H109 Unspecified conjunctivitis: Secondary | ICD-10-CM

## 2012-01-08 MED ORDER — HYDROCODONE-ACETAMINOPHEN 5-325 MG PO TABS: 2.0000 | ORAL_TABLET | ORAL | Status: AC | PRN

## 2012-01-08 MED ORDER — TETRACAINE HCL 0.5 % OP SOLN
OPHTHALMIC | Status: AC
  Filled 2012-01-08: qty 2

## 2012-01-08 MED ORDER — TOBRAMYCIN 0.3 % OP SOLN: 2.0000 [drp] | OPHTHALMIC | Status: AC

## 2012-01-08 NOTE — Discharge Instructions (Signed)
Conjunctivitis Conjunctivitis is commonly called "pink eye." Conjunctivitis can be caused by bacterial or viral infection, allergies, or injuries. There is usually redness of the lining of the eye, itching, discomfort, and sometimes discharge. There may be deposits of matter along the eyelids. A viral infection usually causes a watery discharge, while a bacterial infection causes a yellowish, thick discharge. Pink eye is very contagious and spreads by direct contact. You may be given antibiotic eyedrops as part of your treatment. Before using your eye medicine, remove all drainage from the eye by washing gently with warm water and cotton balls. Continue to use the medication until you have awakened 2 mornings in a row without discharge from the eye. Do not rub your eye. This increases the irritation and helps spread infection. Use separate towels from other household members. Wash your hands with soap and water before and after touching your eyes. Use cold compresses to reduce pain and sunglasses to relieve irritation from light. Do not wear contact lenses or wear eye makeup until the infection is gone. SEEK MEDICAL CARE IF:   Your symptoms are not better after 3 days of treatment.   You have increased pain or trouble seeing.   The outer eyelids become very red or swollen.  Document Released: 08/13/2004 Document Revised: 06/25/2011 Document Reviewed: 07/06/2005 [employer] Patient Information 2012 [employer]. 

## 2012-01-08 NOTE — ED Notes (Signed)
C/o pain, redness and frequent tearing rt eye- states also sensitive to light.  Denies injury or itching.  Sx started 01/04/12.

## 2012-01-08 NOTE — ED Provider Notes (Signed)
History     CSN: [id number]  Arrival date & time 01/08/12  1305   First MD Initiated Contact with Patient 01/08/12 1428      Chief Complaint  Patient presents with  . Eye Pain    (Consider location/radiation/quality/duration/timing/severity/associated sxs/prior treatment) Patient is a 46 y.o. female presenting with eye pain. The history is provided by the patient. No language interpreter was used.  Eye Pain This is a new problem. Episode onset: 4 days ago. The problem occurs constantly. The problem has not changed since onset.She has tried nothing for the symptoms.   patient complains of redness and tearing to her right patient complains of light sensitivity she has had clear date drainage caked slightly in the a.m.  Past Medical History  Diagnosis Date  . Hypertension   . Fibroids   . Hx MRSA infection   . Insomnia due to medical condition   . Hx-TIA (transient ischemic attack)     permanent insomnia    Past Surgical History  Procedure Date  . Wisdom tooth extraction     Family History  Problem Relation Age of Onset  . Anesthesia problems Neg Hx     History  Substance Use Topics  . Smoking status: Current Everyday Smoker -- 1.0 packs/day  . Smokeless tobacco: Never Used  . Alcohol Use: Yes    OB History    Grav Para Term Preterm Abortions TAB SAB Ect Mult Living   4 0 0 0 4 0 4 0 0 0       Review of Systems  Eyes: Positive for pain.    Allergies  Flagyl; Penicillins; and Sulfonamide derivatives  Home Medications   Current Outpatient Rx  Name Route Sig Dispense Refill  . LISINOPRIL PO Oral Take 25 mg by mouth daily.    Marland Kitchen AMLODIPINE BESYLATE 5 MG PO TABS Oral Take 10 mg by mouth daily.     . IBUPROFEN 200 MG PO TABS Oral Take 800 mg by mouth every 6 (six) hours as needed. Patient took this medication for pain.    Marland Kitchen ADVIL PM PO Oral Take 2 tablets by mouth daily as needed. Patient took this medication for sleep.    . NYSTATIN-TRIAMCINOLONE 100000-0.1  UNIT/GM-% EX CREA  Apply to affected area daily 15 g 0    BP 141/94  Pulse 85  Temp 98.2 F (36.8 C) (Oral)  Resp 20  SpO2 100%  LMP 12/25/2011  Physical Exam  Nursing note and vitals reviewed. Constitutional: She is oriented to person, place, and time. She appears well-developed and well-nourished.  HENT:  Head: Normocephalic and atraumatic.  Eyes: EOM are normal. Pupils are equal, round, and reactive to light. Right eye exhibits discharge.       Injected,  No fluro uptake,   tono pressure 14,17 right 14,17 left  Neurological: She is alert and oriented to person, place, and time. She has normal reflexes.  Skin: Skin is warm.  Psychiatric: She has a normal mood and affect.    ED Course  Procedures (including critical care time)  Labs Reviewed - No data to display No results found.   No diagnosis found.    MDM  Tobrex, hydrocodone        Lonia Skinner [place], [place] 01/08/12 1458

## 2012-01-11 NOTE — ED Provider Notes (Signed)
Medical screening examination/treatment/procedure(s) were performed by non-physician practitioner and as supervising physician I was immediately available for consultation/collaboration.  Romy Mcgue   Rubens Cranston, MD 01/11/12 1317 

## 2012-09-12 ENCOUNTER — Encounter (HOSPITAL_COMMUNITY): Payer: Self-pay | Admitting: Emergency Medicine

## 2012-09-12 ENCOUNTER — Emergency Department (HOSPITAL_COMMUNITY)
Admission: EM | Admit: 2012-09-12 | Discharge: 2012-09-12 | Disposition: A | Discharge status: Home / Self Care | Payer: Self-pay | Attending: Emergency Medicine | Admitting: Emergency Medicine

## 2012-09-12 DIAGNOSIS — Z79899 Other long term (current) drug therapy: Secondary | ICD-10-CM | POA: Insufficient documentation

## 2012-09-12 DIAGNOSIS — Z8614 Personal history of Methicillin resistant Staphylococcus aureus infection: Secondary | ICD-10-CM | POA: Insufficient documentation

## 2012-09-12 DIAGNOSIS — F17200 Nicotine dependence, unspecified, uncomplicated: Secondary | ICD-10-CM | POA: Insufficient documentation

## 2012-09-12 DIAGNOSIS — T4995XA Adverse effect of unspecified topical agent, initial encounter: Secondary | ICD-10-CM | POA: Insufficient documentation

## 2012-09-12 DIAGNOSIS — I10 Essential (primary) hypertension: Secondary | ICD-10-CM | POA: Insufficient documentation

## 2012-09-12 DIAGNOSIS — T783XXA Angioneurotic edema, initial encounter: Secondary | ICD-10-CM | POA: Insufficient documentation

## 2012-09-12 DIAGNOSIS — Z8673 Personal history of transient ischemic attack (TIA), and cerebral infarction without residual deficits: Secondary | ICD-10-CM | POA: Insufficient documentation

## 2012-09-12 DIAGNOSIS — Z8742 Personal history of other diseases of the female genital tract: Secondary | ICD-10-CM | POA: Insufficient documentation

## 2012-09-12 MED ORDER — HYDROCHLOROTHIAZIDE 25 MG PO TABS: 12.5000 mg | ORAL_TABLET | Freq: Every day | ORAL | Status: DC

## 2012-09-12 MED ORDER — PREDNISONE 20 MG PO TABS
60.0000 mg | ORAL_TABLET | Freq: Once | ORAL | Status: AC
  Administered 2012-09-12: 60 mg via ORAL
  Filled 2012-09-12: qty 3

## 2012-09-12 MED ORDER — FAMOTIDINE 20 MG PO TABS
20.0000 mg | ORAL_TABLET | Freq: Once | ORAL | Status: AC
  Administered 2012-09-12: 20 mg via ORAL
  Filled 2012-09-12: qty 1

## 2012-09-12 MED ORDER — PREDNISONE 20 MG PO TABS: 60.0000 mg | ORAL_TABLET | Freq: Every day | ORAL | Status: DC

## 2012-09-12 NOTE — ED Notes (Signed)
Pt c/o of facial swelling with no pain, SOB, or difficulty swallowing.

## 2012-09-12 NOTE — Progress Notes (Signed)
ED CM consulted by ED RN to assist with $4 medication list to included HCTZ Pt had voiced concern about cost of medication and inquired if on $4 pharmacy list Pt to be given a $4 medication ([facility] but honored at all major pharmacy chains) list by ED RN

## 2012-09-12 NOTE — ED Provider Notes (Signed)
History     CSN: [id number]  Arrival date & time 09/12/12  0940   First MD Initiated Contact with Patient 09/12/12 [phone]      Chief Complaint  Patient presents with  . Facial Swelling    (Consider location/radiation/quality/duration/timing/severity/associated sxs/prior treatment) The history is provided by the patient.  Karen Ochoa is a 47 y.o. female history of hypertension on lisinopril here presenting with possible angioedema. She noticed left-sided lip swelling since yesterday. Swelling is slightly got worse today. Denies any fevers or chills or difficulty swallowing or stridor. Denies any dental pain or recent procedure. Has been on lisinopril for years. Denies any new meds. Denies numbness or weakness.    Past Medical History  Diagnosis Date  . Hypertension   . Fibroids   . Hx MRSA infection   . Insomnia due to medical condition   . Hx-TIA (transient ischemic attack)     permanent insomnia    Past Surgical History  Procedure Laterality Date  . Wisdom tooth extraction      Family History  Problem Relation Age of Onset  . Anesthesia problems Neg Hx     History  Substance Use Topics  . Smoking status: Current Every Day Smoker -- 1.00 packs/day  . Smokeless tobacco: Never Used  . Alcohol Use: Yes    OB History   Grav Para Term Preterm Abortions TAB SAB Ect Mult Living   4 0 0 0 4 0 4 0 0 0       Review of Systems  HENT:       Lip swelling   All other systems reviewed and are negative.    Allergies  Penicillins; Sulfonamide derivatives; and Flagyl  Home Medications   Current Outpatient Rx  Name  Route  Sig  Dispense  Refill  . ALPRAZolam (XANAX) 1 MG tablet   Oral   Take 1 mg by mouth 3 (three) times daily as needed for sleep or anxiety. Patient only takes at bedtime if absolutely needed         . amLODipine (NORVASC) 5 MG tablet   Oral   Take 10 mg by mouth daily.          . Ibuprofen-Diphenhydramine HCl (ADVIL PM) 200-25 MG CAPS  Oral   Take 2 tablets by mouth at bedtime.         Marland Kitchen lisinopril (PRINIVIL,ZESTRIL) 20 MG tablet   Oral   Take 20 mg by mouth daily.           BP 107/86  Pulse 98  Temp(Src) 98 F (36.7 C) (Oral)  Resp 16  SpO2 99%  LMP 09/11/2012  Physical Exam  Nursing note and vitals reviewed. Constitutional: She is oriented to person, place, and time. She appears well-developed and well-nourished.  HENT:  Head: Normocephalic.  L upper and lower lip swelling. No floor of mouth swelling. OP clear and no soft palate swelling.   Eyes: Conjunctivae are normal. Pupils are equal, round, and reactive to light.  Neck: Normal range of motion. Neck supple.  No stridor   Cardiovascular: Normal rate, regular rhythm and normal heart sounds.   Pulmonary/Chest: Effort normal and breath sounds normal. No respiratory distress. She has no wheezes. She has no rales.  Abdominal: Soft. Bowel sounds are normal. She exhibits no distension. There is no tenderness. There is no guarding.  Musculoskeletal: Normal range of motion.  Neurological: She is alert and oriented to person, place, and time.  No facial droop. NL sensation and  motor throughout   Skin: Skin is warm and dry.  Psychiatric: She has a normal mood and affect. Her behavior is normal. Judgment and thought content normal.    ED Course  Procedures (including critical care time)  Labs Reviewed - No data to display No results found.   No diagnosis found.   Date: 09/12/2012  Rate: 99  Rhythm: normal sinus rhythm  QRS Axis: normal  Intervals: normal  ST/T Wave abnormalities: nonspecific ST changes  Conduction Disutrbances:none  Narrative Interpretation:   Old EKG Reviewed: unchanged    MDM  Karen Ochoa is a 47 y.o. female here with L lip swelling, likely angioedema. Will give steroids and reassess.   11:33 AM Observed for 2 hrs in the ED. Swelling stable. Will d/c home on prednisone, benadryl and pepcid prn. She is to stop  lisinopril. Will switch her over to HCTZ.         Richardean Canal, MD 09/12/12 1134

## 2012-09-13 ENCOUNTER — Encounter (HOSPITAL_COMMUNITY): Payer: Self-pay | Admitting: Emergency Medicine

## 2012-09-13 ENCOUNTER — Emergency Department (HOSPITAL_COMMUNITY)
Admission: EM | Admit: 2012-09-13 | Discharge: 2012-09-13 | Disposition: A | Discharge status: Home / Self Care | Payer: Self-pay | Attending: Emergency Medicine | Admitting: Emergency Medicine

## 2012-09-13 DIAGNOSIS — R0602 Shortness of breath: Secondary | ICD-10-CM | POA: Insufficient documentation

## 2012-09-13 DIAGNOSIS — F17200 Nicotine dependence, unspecified, uncomplicated: Secondary | ICD-10-CM | POA: Insufficient documentation

## 2012-09-13 DIAGNOSIS — T783XXD Angioneurotic edema, subsequent encounter: Secondary | ICD-10-CM

## 2012-09-13 DIAGNOSIS — T783XXA Angioneurotic edema, initial encounter: Secondary | ICD-10-CM | POA: Insufficient documentation

## 2012-09-13 DIAGNOSIS — R110 Nausea: Secondary | ICD-10-CM | POA: Insufficient documentation

## 2012-09-13 DIAGNOSIS — G4700 Insomnia, unspecified: Secondary | ICD-10-CM | POA: Insufficient documentation

## 2012-09-13 DIAGNOSIS — Z79899 Other long term (current) drug therapy: Secondary | ICD-10-CM | POA: Insufficient documentation

## 2012-09-13 DIAGNOSIS — Z8742 Personal history of other diseases of the female genital tract: Secondary | ICD-10-CM | POA: Insufficient documentation

## 2012-09-13 DIAGNOSIS — Z8614 Personal history of Methicillin resistant Staphylococcus aureus infection: Secondary | ICD-10-CM | POA: Insufficient documentation

## 2012-09-13 DIAGNOSIS — I10 Essential (primary) hypertension: Secondary | ICD-10-CM | POA: Insufficient documentation

## 2012-09-13 DIAGNOSIS — Z8673 Personal history of transient ischemic attack (TIA), and cerebral infarction without residual deficits: Secondary | ICD-10-CM | POA: Insufficient documentation

## 2012-09-13 DIAGNOSIS — R220 Localized swelling, mass and lump, head: Secondary | ICD-10-CM | POA: Insufficient documentation

## 2012-09-13 DIAGNOSIS — T448X5A Adverse effect of centrally-acting and adrenergic-neuron-blocking agents, initial encounter: Secondary | ICD-10-CM | POA: Insufficient documentation

## 2012-09-13 LAB — COMPREHENSIVE METABOLIC PANEL
ALT: 11 U/L (ref 0–35)
Alkaline Phosphatase: 94 U/L (ref 39–117)
BUN: 27 mg/dL — ABNORMAL HIGH (ref 6–23)
CO2: 20 mEq/L (ref 19–32)
GFR calc Af Amer: 42 mL/min — ABNORMAL LOW (ref >90)
GFR calc non Af Amer: 37 mL/min — ABNORMAL LOW (ref >90)
Glucose, Bld: 107 mg/dL — ABNORMAL HIGH (ref 70–99)
Potassium: 3.8 mEq/L (ref 3.5–5.1)
Sodium: 134 mEq/L — ABNORMAL LOW (ref 135–145)
Total Bilirubin: 0.1 mg/dL — ABNORMAL LOW (ref 0.3–1.2)

## 2012-09-13 LAB — CBC WITH DIFFERENTIAL/PLATELET
Basophils Relative: 1 % (ref 0–1)
Eosinophils Relative: 0 % (ref 0–5)
HCT: 34.3 % — ABNORMAL LOW (ref 36.0–46.0)
Hemoglobin: 11.8 g/dL — ABNORMAL LOW (ref 12.0–15.0)
Lymphocytes Relative: 23 % (ref 12–46)
MCH: 30.1 pg (ref 26.0–34.0)
Neutro Abs: 6.3 10*3/uL (ref 1.7–7.7)
Neutrophils Relative %: 66 % (ref 43–77)
RBC: 3.92 MIL/uL (ref 3.87–5.11)

## 2012-09-13 MED ORDER — ONDANSETRON 8 MG PO TBDP
8.0000 mg | ORAL_TABLET | Freq: Once | ORAL | Status: AC
  Administered 2012-09-13: 8 mg via ORAL
  Filled 2012-09-13: qty 1

## 2012-09-13 MED ORDER — HYDROCHLOROTHIAZIDE 25 MG PO TABS
25.0000 mg | ORAL_TABLET | Freq: Once | ORAL | Status: AC
  Administered 2012-09-13: 25 mg via ORAL
  Filled 2012-09-13: qty 1

## 2012-09-13 MED ORDER — PREDNISONE 20 MG PO TABS
40.0000 mg | ORAL_TABLET | Freq: Once | ORAL | Status: AC
  Administered 2012-09-13: 40 mg via ORAL
  Filled 2012-09-13: qty 2

## 2012-09-13 MED ORDER — HYDROCHLOROTHIAZIDE 12.5 MG PO CAPS
25.0000 mg | ORAL_CAPSULE | Freq: Once | ORAL | Status: DC
  Filled 2012-09-13: qty 2

## 2012-09-13 MED ORDER — IBUPROFEN 800 MG PO TABS
800.0000 mg | ORAL_TABLET | Freq: Once | ORAL | Status: AC
  Administered 2012-09-13: 800 mg via ORAL
  Filled 2012-09-13: qty 1

## 2012-09-13 MED ORDER — METHYLPREDNISOLONE SODIUM SUCC 125 MG IJ SOLR
125.0000 mg | Freq: Once | INTRAMUSCULAR | Status: AC
  Administered 2012-09-13: 125 mg via INTRAMUSCULAR
  Filled 2012-09-13: qty 2

## 2012-09-13 MED ORDER — DIPHENHYDRAMINE HCL 25 MG PO CAPS
25.0000 mg | ORAL_CAPSULE | Freq: Once | ORAL | Status: AC
  Administered 2012-09-13: 25 mg via ORAL
  Filled 2012-09-13: qty 1

## 2012-09-13 MED ORDER — FAMOTIDINE 20 MG PO TABS
20.0000 mg | ORAL_TABLET | Freq: Once | ORAL | Status: AC
  Administered 2012-09-13: 20 mg via ORAL
  Filled 2012-09-13: qty 1

## 2012-09-13 NOTE — ED Notes (Addendum)
Pt complaining of a 10/10 due to IM injection(Solumedrol) in right arm.

## 2012-09-13 NOTE — ED Provider Notes (Signed)
History     CSN: [id number]  Arrival date & time 09/13/12  4098   First MD Initiated Contact with Patient 09/13/12 [phone]      Chief Complaint  Patient presents with  . Abdominal Pain  . Shortness of Breath    (Consider location/radiation/quality/duration/timing/severity/associated sxs/prior treatment) HPI Comments: Patient is a 47 year old female with a past medical history of hypertension, fibriods, and previous TIA who presents with epigastric pain that started last night. The pain is located in her epigastrium and does not radiate. The pain is described as cramping and severe. The pain started gradually and progressively worsened since the onset. The pain is intermittent without known trigger. The episodes of pain last a few minutes before spontaneously resolving. No alleviating/aggravating factors. The patient has tried pepto bismol for symptoms without relief. Associated symptoms include nausea and a brief episode of SOB. Patient denies fever, headache, vomiting, diarrhea, chest pain, dysuria, constipation, abnormal vaginal bleeding/discharge. Patient is also concerned about her diffuse facial swelling. Patient presented to the ED yesterday with angioedema due to lisinopril. The swelling was localized to her lips yesterday which has resolved but now has spread to the rest of her face. She denies throat closing or throat swelling.      Past Medical History  Diagnosis Date  . Hypertension   . Fibroids   . Hx MRSA infection   . Insomnia due to medical condition   . Hx-TIA (transient ischemic attack)     permanent insomnia    Past Surgical History  Procedure Laterality Date  . Wisdom tooth extraction      Family History  Problem Relation Age of Onset  . Anesthesia problems Neg Hx     History  Substance Use Topics  . Smoking status: Current Every Day Smoker -- 1.00 packs/day  . Smokeless tobacco: Never Used  . Alcohol Use: Yes    OB History   Grav Para Term Preterm  Abortions TAB SAB Ect Mult Living   4 0 0 0 4 0 4 0 0 0       Review of Systems  HENT: Positive for facial swelling.   Respiratory: Positive for shortness of breath.   Gastrointestinal: Positive for nausea and abdominal pain.  All other systems reviewed and are negative.    Allergies  Penicillins; Sulfonamide derivatives; and Flagyl  Home Medications   Current Outpatient Rx  Name  Route  Sig  Dispense  Refill  . ALPRAZolam (XANAX) 1 MG tablet   Oral   Take 1 mg by mouth 3 (three) times daily as needed for sleep or anxiety. Patient only takes at bedtime if absolutely needed         . amLODipine (NORVASC) 5 MG tablet   Oral   Take 10 mg by mouth daily.          . diphenhydrAMINE (BENADRYL) 25 mg capsule   Oral   Take 25 mg by mouth every 6 (six) hours as needed for itching or allergies.         Marland Kitchen ibuprofen (ADVIL,MOTRIN) 800 MG tablet   Oral   Take 800 mg by mouth every 8 (eight) hours as needed for pain.         Marland Kitchen lisinopril (PRINIVIL,ZESTRIL) 20 MG tablet   Oral   Take 20 mg by mouth daily.         . hydrochlorothiazide (HYDRODIURIL) 25 MG tablet   Oral   Take 0.5 tablets (12.5 mg total) by mouth daily.  30 tablet   0   . predniSONE (DELTASONE) 20 MG tablet   Oral   Take 3 tablets (60 mg total) by mouth daily.   15 tablet   0     BP 189/115  Pulse 88  Temp(Src) 97.5 F (36.4 C) (Oral)  Resp 18  SpO2 100%  LMP 09/11/2012  Physical Exam  Nursing note and vitals reviewed. Constitutional: She is oriented to person, place, and time. She appears well-developed and well-nourished. No distress.  HENT:  Head: Normocephalic and atraumatic.  Mouth/Throat: Oropharynx is clear and moist. No oropharyngeal exudate.  Diffuse facial swelling. No tonsillar edema or evidence of throat closing.   Eyes: Conjunctivae and EOM are normal.  Neck: Normal range of motion. Neck supple.  Cardiovascular: Normal rate and regular rhythm.  Exam reveals no gallop and no  friction rub.   No murmur heard. Pulmonary/Chest: Effort normal and breath sounds normal. She has no wheezes. She has no rales. She exhibits no tenderness.  Abdominal: Soft. She exhibits no distension. There is tenderness. There is no rebound and no guarding.  Mild epigastric tenderness to palpation.   Musculoskeletal: Normal range of motion.  Neurological: She is alert and oriented to person, place, and time. Coordination normal.  Speech is goal-oriented. Moves limbs without ataxia.   Skin: Skin is warm and dry.  Psychiatric: She has a normal mood and affect. Her behavior is normal.    ED Course  Procedures (including critical care time)  Labs Reviewed  CBC WITH DIFFERENTIAL - Abnormal; Notable for the following:    Hemoglobin 11.8 (*)    HCT 34.3 (*)    All other components within normal limits  COMPREHENSIVE METABOLIC PANEL - Abnormal; Notable for the following:    Sodium 134 (*)    Glucose, Bld 107 (*)    BUN 27 (*)    Creatinine, Ser 1.61 (*)    Albumin 3.3 (*)    Total Bilirubin 0.1 (*)    GFR calc non Af Amer 37 (*)    GFR calc Af Amer 42 (*)    All other components within normal limits  LIPASE, BLOOD  TROPONIN I  PATHOLOGIST SMEAR REVIEW   No results found.   1. Angioedema, subsequent encounter       MDM  6:31 AM Labs pending. Patient will have ibuprofen for pain and pepcid, solumedrol, and benadryl.   8:19 AM Labs unremarkable. Patient reports some relief. Patient likely having some GI upset due to medications administered yesterday. Also, patient's complaint of persistent facial swelling is likely residual from her angioedema from lisinopril. Patient instructed to continue taking prednisone PO at home and return to the ED with any throat closing or difficulty breathing. Vitals stable for discharge.      Emilia Beck, [place] 09/13/12 [phone]

## 2012-09-13 NOTE — ED Notes (Signed)
Pt arrived from home; pt presents to ED with c/o epigastric pain with nausea, also c/o shortness of breath-- onset last night before she went to bed.  Per pt, pt was here yesterday for "an allergic reaction to Lisinopril".

## 2012-09-14 NOTE — ED Provider Notes (Signed)
Medical screening examination/treatment/procedure(s) were performed by non-physician practitioner and as supervising physician I was immediately available for consultation/collaboration.    Geovonni Meyerhoff R Kenniel Bergsma, MD 09/14/12 2208 

## 2013-06-08 ENCOUNTER — Encounter (HOSPITAL_COMMUNITY): Payer: Self-pay | Admitting: Emergency Medicine

## 2013-06-08 ENCOUNTER — Emergency Department (HOSPITAL_COMMUNITY)
Admission: EM | Admit: 2013-06-08 | Discharge: 2013-06-08 | Disposition: A | Discharge status: Home / Self Care | Payer: Self-pay | Attending: Emergency Medicine | Admitting: Emergency Medicine

## 2013-06-08 DIAGNOSIS — Z79899 Other long term (current) drug therapy: Secondary | ICD-10-CM | POA: Insufficient documentation

## 2013-06-08 DIAGNOSIS — Z880 Allergy status to penicillin: Secondary | ICD-10-CM | POA: Insufficient documentation

## 2013-06-08 DIAGNOSIS — Z8673 Personal history of transient ischemic attack (TIA), and cerebral infarction without residual deficits: Secondary | ICD-10-CM | POA: Insufficient documentation

## 2013-06-08 DIAGNOSIS — N611 Abscess of the breast and nipple: Secondary | ICD-10-CM

## 2013-06-08 DIAGNOSIS — Z8614 Personal history of Methicillin resistant Staphylococcus aureus infection: Secondary | ICD-10-CM | POA: Insufficient documentation

## 2013-06-08 DIAGNOSIS — N61 Mastitis without abscess: Secondary | ICD-10-CM | POA: Insufficient documentation

## 2013-06-08 DIAGNOSIS — F17200 Nicotine dependence, unspecified, uncomplicated: Secondary | ICD-10-CM | POA: Insufficient documentation

## 2013-06-08 DIAGNOSIS — I10 Essential (primary) hypertension: Secondary | ICD-10-CM | POA: Insufficient documentation

## 2013-06-08 MED ORDER — OXYCODONE-ACETAMINOPHEN 5-325 MG PO TABS
2.0000 | ORAL_TABLET | Freq: Once | ORAL | Status: AC
  Administered 2013-06-08: 2 via ORAL
  Filled 2013-06-08: qty 2

## 2013-06-08 NOTE — ED Provider Notes (Signed)
Medical screening examination/treatment/procedure(s) were conducted as a shared visit with non-physician practitioner(s) or resident  and myself.  I personally evaluated the patient during the encounter and agree with the findings and plan unless otherwise indicated.    I have personally reviewed any xrays and/ or EKG's with the provider and I agree with interpretation.   SMall right nipple abscess, mild tender/ erythema on exam.  With location, focal I and D by PA, close fup discussed.  Warm compresses and outpt fup. Well appearing otherwise.  Nurse in the room during exam.  Right nipple abscess  Enid Skeens, MD 06/08/13 [phone]

## 2013-06-08 NOTE — ED Notes (Signed)
Patient reports that the abscess on the right nipple began 4 days ago. Patient has yellow drainage from the right nipple. Patient has been using warm compresses to the area.

## 2013-06-08 NOTE — ED Notes (Signed)
Patient with abscess to R nipple that she noticed on Saturday. Patient states the pain intensified over the past 24-48 hours and noticed drainage today after showering.

## 2013-06-08 NOTE — ED Provider Notes (Signed)
CSN: [id number]     Arrival date & time 06/08/13  1340 History   First MD Initiated Contact with Patient 06/08/13 1405     Chief Complaint  Patient presents with  . Abscess    right nipple   (Consider location/radiation/quality/duration/timing/severity/associated sxs/prior Treatment) HPI Comments: Patient is a 47 year old female with a past medical history of hypertension, fibroids and a MRSA infection who presents to the emergency department complaining of an abscess to her right nipple area that she noticed 4 days ago. Patient states the area has gotten larger, started to drain yellow pus and is tender. She has been applying warm compresses to the area with mild relief. Denies fever or chills.  Patient is a 47 y.o. female presenting with abscess. The history is provided by the patient.  Abscess   Past Medical History  Diagnosis Date  . Hypertension   . Fibroids   . Hx MRSA infection   . Insomnia due to medical condition   . Hx-TIA (transient ischemic attack)     permanent insomnia   Past Surgical History  Procedure Laterality Date  . Wisdom tooth extraction     Family History  Problem Relation Age of Onset  . Anesthesia problems Neg Hx   . Hypertension Mother   . Diabetes Sister   . Asthma Brother   . Diabetes Brother    History  Substance Use Topics  . Smoking status: Current Every Day Smoker -- 1.00 packs/day    Types: Cigarettes  . Smokeless tobacco: Never Used  . Alcohol Use: Yes     Comment: weekends   OB History   Grav Para Term Preterm Abortions TAB SAB Ect Mult Living   4 0 0 0 4 0 4 0 0 0      Review of Systems  Skin:       Positive for abscess to right nipple.  All other systems reviewed and are negative.    Allergies  Penicillins; Sulfonamide derivatives; and Flagyl  Home Medications   Current Outpatient Rx  Name  Route  Sig  Dispense  Refill  . amLODipine (NORVASC) 10 MG tablet   Oral   Take 10 mg by mouth daily.         .  hydrochlorothiazide (HYDRODIURIL) 25 MG tablet   Oral   Take 25 mg by mouth daily.         Marland Kitchen ibuprofen (ADVIL,MOTRIN) 200 MG tablet   Oral   Take 800 mg by mouth every 6 (six) hours as needed.         . Ibuprofen-Diphenhydramine Cit (ADVIL PM PO)   Oral   Take 1 capsule by mouth at bedtime.         . traZODone (DESYREL) 100 MG tablet   Oral   Take 100 mg by mouth at bedtime.          BP 141/90  Pulse 88  Temp(Src) 98.2 F (36.8 C) (Oral)  Resp 18  SpO2 99%  LMP 05/19/2013 Physical Exam  Nursing note and vitals reviewed. Constitutional: She is oriented to person, place, and time. She appears well-developed and well-nourished. No distress.  HENT:  Head: Normocephalic and atraumatic.  Mouth/Throat: Oropharynx is clear and moist.  Eyes: Conjunctivae are normal.  Neck: Normal range of motion. Neck supple.  Cardiovascular: Normal rate, regular rhythm and normal heart sounds.   Pulmonary/Chest: Effort normal and breath sounds normal.  Genitourinary:  0.5 cm abscess on right nipple, tender, fluctuant, draining pus, no surrounding cellulitis.  Musculoskeletal: Normal range of motion. She exhibits no edema.  Neurological: She is alert and oriented to person, place, and time.  Skin: Skin is warm and dry. She is not diaphoretic.  Psychiatric: She has a normal mood and affect. Her behavior is normal.    ED Course  Procedures (including critical care time)  INCISION AND DRAINAGE Performed by: Johnnette Gourd Consent: Verbal consent obtained. Risks and benefits: risks, benefits and alternatives were discussed Type: abscess  Body area: right nipple  Anesthesia: local infiltration  Tiny incision was made with a scalpel.  Local anesthetic: lidocaine 2% without epinephrine  Anesthetic total: 1 ml  Complexity: simple   Drainage: purulent  Drainage amount: small  Packing material: none  Patient tolerance: Patient tolerated the procedure well with no immediate  complications.    Labs Review Labs Reviewed - No data to display Imaging Review No results found.  EKG Interpretation   None       MDM   1. Abscess of nipple, right    Pt with abscess of right nipple. No nipple drainage, just drainage from abscess. Abscess drained, tiny incision with scalpel, purulent material expressed. No surrounding cellulitis. Advised warm compresses, ibuprofen for pain. Return precautions given. Patient states understanding of treatment care plan and is agreeable.     Trevor Mace, PA-C 06/08/13 1510

## 2013-06-10 ENCOUNTER — Emergency Department (HOSPITAL_COMMUNITY)
Admission: EM | Admit: 2013-06-10 | Discharge: 2013-06-10 | Disposition: A | Discharge status: Home / Self Care | Payer: Self-pay | Source: Home / Self Care

## 2014-05-21 ENCOUNTER — Encounter (HOSPITAL_COMMUNITY): Payer: Self-pay | Admitting: Emergency Medicine

## 2015-02-28 ENCOUNTER — Emergency Department (HOSPITAL_BASED_OUTPATIENT_CLINIC_OR_DEPARTMENT_OTHER)
Admission: EM | Admit: 2015-02-28 | Discharge: 2015-02-28 | Disposition: A | Discharge status: Home / Self Care | Payer: Self-pay | Attending: Emergency Medicine | Admitting: Emergency Medicine

## 2015-02-28 ENCOUNTER — Encounter (HOSPITAL_BASED_OUTPATIENT_CLINIC_OR_DEPARTMENT_OTHER): Payer: Self-pay | Admitting: *Deleted

## 2015-02-28 DIAGNOSIS — Z79899 Other long term (current) drug therapy: Secondary | ICD-10-CM | POA: Insufficient documentation

## 2015-02-28 DIAGNOSIS — Z720 Tobacco use: Secondary | ICD-10-CM | POA: Insufficient documentation

## 2015-02-28 DIAGNOSIS — Z86018 Personal history of other benign neoplasm: Secondary | ICD-10-CM | POA: Insufficient documentation

## 2015-02-28 DIAGNOSIS — Z880 Allergy status to penicillin: Secondary | ICD-10-CM | POA: Insufficient documentation

## 2015-02-28 DIAGNOSIS — H6692 Otitis media, unspecified, left ear: Secondary | ICD-10-CM | POA: Insufficient documentation

## 2015-02-28 DIAGNOSIS — Z8673 Personal history of transient ischemic attack (TIA), and cerebral infarction without residual deficits: Secondary | ICD-10-CM | POA: Insufficient documentation

## 2015-02-28 DIAGNOSIS — R0981 Nasal congestion: Secondary | ICD-10-CM | POA: Insufficient documentation

## 2015-02-28 DIAGNOSIS — Z8614 Personal history of Methicillin resistant Staphylococcus aureus infection: Secondary | ICD-10-CM | POA: Insufficient documentation

## 2015-02-28 DIAGNOSIS — I10 Essential (primary) hypertension: Secondary | ICD-10-CM | POA: Insufficient documentation

## 2015-02-28 MED ORDER — AZITHROMYCIN 250 MG PO TABS: ORAL_TABLET | ORAL | Status: DC

## 2015-02-28 NOTE — ED Notes (Signed)
Ringing in her ears x 3 days.

## 2015-02-28 NOTE — Discharge Instructions (Signed)
Otitis Media Otitis media is redness, soreness, and inflammation of the middle ear. Otitis media may be caused by allergies or, most commonly, by infection. Often it occurs as a complication of the common cold. SIGNS AND SYMPTOMS Symptoms of otitis media may include:  Earache.  Fever.  Ringing in your ear.  Headache.  Leakage of fluid from the ear. DIAGNOSIS To diagnose otitis media, your health care provider will examine your ear with an otoscope. This is an instrument that allows your health care provider to see into your ear in order to examine your eardrum. Your health care provider also will ask you questions about your symptoms. TREATMENT  Typically, otitis media resolves on its own within 3-5 days. Your health care provider may prescribe medicine to ease your symptoms of pain. If otitis media does not resolve within 5 days or is recurrent, your health care provider may prescribe antibiotic medicines if he or she suspects that a bacterial infection is the cause. HOME CARE INSTRUCTIONS   If you were prescribed an antibiotic medicine, finish it all even if you start to feel better.  Take medicines only as directed by your health care provider.  Keep all follow-up visits as directed by your health care provider. SEEK MEDICAL CARE IF:  You have otitis media only in one ear, or bleeding from your nose, or both.  You notice a lump on your neck.  You are not getting better in 3-5 days.  You feel worse instead of better. SEEK IMMEDIATE MEDICAL CARE IF:   You have pain that is not controlled with medicine.  You have swelling, redness, or pain around your ear or stiffness in your neck.  You notice that part of your face is paralyzed.  You notice that the bone behind your ear (mastoid) is tender when you touch it. MAKE SURE YOU:   Understand these instructions.  Will watch your condition.  Will get help right away if you are not doing well or get worse. Document Released:  04/10/2004 Document Revised: 11/20/2013 Document Reviewed: 01/31/2013 [facility] Patient Information 2015 [place], [place]. This information is not intended to replace advice given to you by your health care provider. Make sure you discuss any questions you have with your health care provider. Barotitis Media Barotitis media is inflammation of your middle ear. This occurs when the auditory tube (eustachian tube) leading from the back of your nose (nasopharynx) to your eardrum is blocked. This blockage may result from a cold, environmental allergies, or an upper respiratory infection. Unresolved barotitis media may lead to damage or hearing loss (barotrauma), which may become permanent. HOME CARE INSTRUCTIONS   Use medicines as recommended by your health care provider. Over-the-counter medicines will help unblock the canal and can help during times of air travel.  Do not put anything into your ears to clean or unplug them. Eardrops will not be helpful.  Do not swim, dive, or fly until your health care provider says it is all right to do so. If these activities are necessary, chewing gum with frequent, forceful swallowing may help. It is also helpful to hold your nose and gently blow to pop your ears for equalizing pressure changes. This forces air into the eustachian tube.  Only take over-the-counter or prescription medicines for pain, discomfort, or fever as directed by your health care provider.  A decongestant may be helpful in decongesting the middle ear and make pressure equalization easier. SEEK MEDICAL CARE IF:  You experience a serious form of dizziness  in which you feel as if the room is spinning and you feel nauseated (vertigo).  Your symptoms only involve one ear. SEEK IMMEDIATE MEDICAL CARE IF:   You develop a severe headache, dizziness, or severe ear pain.  You have bloody or pus-like drainage from your ears.  You develop a fever.  Your problems do not improve or become  worse. MAKE SURE YOU:   Understand these instructions.  Will watch your condition.  Will get help right away if you are not doing well or get worse. Document Released: 07/03/2000 Document Revised: 04/26/2013 Document Reviewed: 01/31/2013 [facility] Patient Information 2015 [place], [place]. This information is not intended to replace advice given to you by your health care provider. Make sure you discuss any questions you have with your health care provider.  Hypertension Hypertension, commonly called high blood pressure, is when the force of blood pumping through your arteries is too strong. Your arteries are the blood vessels that carry blood from your heart throughout your body. A blood pressure reading consists of a higher number over a lower number, such as 110/72. The higher number (systolic) is the pressure inside your arteries when your heart pumps. The lower number (diastolic) is the pressure inside your arteries when your heart relaxes. Ideally you want your blood pressure below 120/80. Hypertension forces your heart to work harder to pump blood. Your arteries may become narrow or stiff. Having hypertension puts you at risk for heart disease, stroke, and other problems.  RISK FACTORS Some risk factors for high blood pressure are controllable. Others are not.  Risk factors you cannot control include:   Race. You may be at higher risk if you are African American.  Age. Risk increases with age.  Gender. Men are at higher risk than women before age 82 years. After age 60, women are at higher risk than men. Risk factors you can control include:  Not getting enough exercise or physical activity.  Being overweight.  Getting too much fat, sugar, calories, or salt in your diet.  Drinking too much alcohol. SIGNS AND SYMPTOMS Hypertension does not usually cause signs or symptoms. Extremely high blood pressure (hypertensive crisis) may cause headache, anxiety, shortness of breath, and  nosebleed. DIAGNOSIS  To check if you have hypertension, your health care provider will measure your blood pressure while you are seated, with your arm held at the level of your heart. It should be measured at least twice using the same arm. Certain conditions can cause a difference in blood pressure between your right and left arms. A blood pressure reading that is higher than normal on one occasion does not mean that you need treatment. If one blood pressure reading is high, ask your health care provider about having it checked again. TREATMENT  Treating high blood pressure includes making lifestyle changes and possibly taking medicine. Living a healthy lifestyle can help lower high blood pressure. You may need to change some of your habits. Lifestyle changes may include:  Following the DASH diet. This diet is high in fruits, vegetables, and whole grains. It is low in salt, red meat, and added sugars.  Getting at least 2 hours of brisk physical activity every week.  Losing weight if necessary.  Not smoking.  Limiting alcoholic beverages.  Learning ways to reduce stress. If lifestyle changes are not enough to get your blood pressure under control, your health care provider may prescribe medicine. You may need to take more than one. Work closely with your health care provider  to understand the risks and benefits. HOME CARE INSTRUCTIONS  Have your blood pressure rechecked as directed by your health care provider.   Take medicines only as directed by your health care provider. Follow the directions carefully. Blood pressure medicines must be taken as prescribed. The medicine does not work as well when you skip doses. Skipping doses also puts you at risk for problems.   Do not smoke.   Monitor your blood pressure at home as directed by your health care provider. SEEK MEDICAL CARE IF:   You think you are having a reaction to medicines taken.  You have recurrent headaches or feel  dizzy.  You have swelling in your ankles.  You have trouble with your vision. SEEK IMMEDIATE MEDICAL CARE IF:  You develop a severe headache or confusion.  You have unusual weakness, numbness, or feel faint.  You have severe chest or abdominal pain.  You vomit repeatedly.  You have trouble breathing. MAKE SURE YOU:   Understand these instructions.  Will watch your condition.  Will get help right away if you are not doing well or get worse. Document Released: 07/06/2005 Document Revised: 11/20/2013 Document Reviewed: 04/28/2013 [facility] Patient Information 2015 [place], [place]. This information is not intended to replace advice given to you by your health care provider. Make sure you discuss any questions you have with your health care provider.

## 2015-02-28 NOTE — ED Provider Notes (Signed)
CSN: [id number]     Arrival date & time 02/28/15  1733 History   First MD Initiated Contact with Patient 02/28/15 1918     Chief Complaint  Patient presents with  . Tinnitus     (Consider location/radiation/quality/duration/timing/severity/associated sxs/prior Treatment) HPI   Karen Ochoa is a(n) 49 y.o. female who presents with cc R ear pain. Onset 2 days ago. C/o muffled hearing. Aching pain in the ear. Occasional shooting pain in the mastoid process. She denies any fevers, chills, neck pain, headache. She has chronic nasal congestion. She denies any recent barometric pressure changes or swimming.  Past Medical History  Diagnosis Date  . Hypertension   . Fibroids   . Hx MRSA infection   . Insomnia due to medical condition   . Hx-TIA (transient ischemic attack)     permanent insomnia   Past Surgical History  Procedure Laterality Date  . Wisdom tooth extraction     Family History  Problem Relation Age of Onset  . Anesthesia problems Neg Hx   . Hypertension Mother   . Diabetes Sister   . Asthma Brother   . Diabetes Brother    Social History  Substance Use Topics  . Smoking status: Current Every Day Smoker -- 1.00 packs/day    Types: Cigarettes  . Smokeless tobacco: Never Used  . Alcohol Use: Yes     Comment: weekends   OB History    Gravida Para Term Preterm AB TAB SAB Ectopic Multiple Living   4 0 0 0 4 0 4 0 0 0      Review of Systems  Constitutional: Negative for fever and chills.  HENT: Positive for congestion, ear pain and hearing loss. Negative for dental problem and trouble swallowing.   Gastrointestinal: Negative for nausea and vomiting.  Neurological: Negative for dizziness and headaches.      Allergies  Penicillins; Sulfonamide derivatives; and Flagyl  Home Medications   Prior to Admission medications   Medication Sig Start Date End Date Taking? Authorizing Provider  amLODipine (NORVASC) 10 MG tablet Take 10 mg by mouth daily.    Historical  Provider, MD  hydrochlorothiazide (HYDRODIURIL) 25 MG tablet Take 25 mg by mouth daily.    Historical Provider, MD  ibuprofen (ADVIL,MOTRIN) 200 MG tablet Take 800 mg by mouth every 6 (six) hours as needed.    Historical Provider, MD  Ibuprofen-Diphenhydramine Cit (ADVIL PM PO) Take 1 capsule by mouth at bedtime.    Historical Provider, MD  traZODone (DESYREL) 100 MG tablet Take 100 mg by mouth at bedtime.    Historical Provider, MD   BP 137/100 mmHg  Pulse 94  Temp(Src) 98.1 F (36.7 C) (Oral)  Resp 20  Ht 5\' 7"  (1.702 m)  Wt 165 lb (74.844 kg)  BMI 25.84 kg/m2  SpO2 100% Physical Exam  Constitutional: She is oriented to person, place, and time. She appears well-developed and well-nourished. No distress.  HENT:  Head: Normocephalic and atraumatic.  Left Ear: Tympanic membrane normal.  Ears:  Eyes: Conjunctivae are normal. No scleral icterus.  Neck: Normal range of motion.  Cardiovascular: Normal rate, regular rhythm and normal heart sounds.  Exam reveals no gallop and no friction rub.   No murmur heard. Pulmonary/Chest: Effort normal and breath sounds normal. No respiratory distress.  Abdominal: Soft. Bowel sounds are normal. She exhibits no distension and no mass. There is no tenderness. There is no guarding.  Neurological: She is alert and oriented to person, place, and time.  Skin: Skin  is warm and dry. She is not diaphoretic.    ED Course  Procedures (including critical care time) Labs Review Labs Reviewed - No data to display  Imaging Review No results found. Ulla Potash, personally reviewed and evaluated these images and lab results as part of my medical decision-making.   EKG Interpretation None      MDM   Final diagnoses:  None    Patient with right TM retracted. May represent eustachian tube dysfunction versus acute otitis media. Patient will be discharged with azithromycin. Follow up with her PCP or otolaryngologist. No signs of neck pain, mastoid  process tenderness or serious infections.    Margarita Mail, PA-C 02/28/15 1943  Charlesetta Shanks, MD 03/02/15 [phone]

## 2015-07-21 DIAGNOSIS — J45909 Unspecified asthma, uncomplicated: Secondary | ICD-10-CM

## 2015-07-21 HISTORY — DX: Unspecified asthma, uncomplicated: J45.909

## 2015-08-27 ENCOUNTER — Encounter (HOSPITAL_COMMUNITY): Payer: Self-pay

## 2015-08-27 ENCOUNTER — Emergency Department (HOSPITAL_COMMUNITY)
Admission: EM | Admit: 2015-08-27 | Discharge: 2015-08-27 | Disposition: A | Discharge status: Home / Self Care | Payer: Self-pay | Attending: Emergency Medicine | Admitting: Emergency Medicine

## 2015-08-27 DIAGNOSIS — G4700 Insomnia, unspecified: Secondary | ICD-10-CM | POA: Insufficient documentation

## 2015-08-27 DIAGNOSIS — J111 Influenza due to unidentified influenza virus with other respiratory manifestations: Secondary | ICD-10-CM | POA: Insufficient documentation

## 2015-08-27 DIAGNOSIS — R69 Illness, unspecified: Secondary | ICD-10-CM

## 2015-08-27 DIAGNOSIS — F17210 Nicotine dependence, cigarettes, uncomplicated: Secondary | ICD-10-CM | POA: Insufficient documentation

## 2015-08-27 DIAGNOSIS — Z880 Allergy status to penicillin: Secondary | ICD-10-CM | POA: Insufficient documentation

## 2015-08-27 DIAGNOSIS — Z8614 Personal history of Methicillin resistant Staphylococcus aureus infection: Secondary | ICD-10-CM | POA: Insufficient documentation

## 2015-08-27 DIAGNOSIS — I10 Essential (primary) hypertension: Secondary | ICD-10-CM | POA: Insufficient documentation

## 2015-08-27 DIAGNOSIS — Z79899 Other long term (current) drug therapy: Secondary | ICD-10-CM | POA: Insufficient documentation

## 2015-08-27 DIAGNOSIS — Z86018 Personal history of other benign neoplasm: Secondary | ICD-10-CM | POA: Insufficient documentation

## 2015-08-27 DIAGNOSIS — Z8673 Personal history of transient ischemic attack (TIA), and cerebral infarction without residual deficits: Secondary | ICD-10-CM | POA: Insufficient documentation

## 2015-08-27 MED ORDER — BENZONATATE 100 MG PO CAPS: 100.0000 mg | ORAL_CAPSULE | Freq: Three times a day (TID) | ORAL | Status: DC

## 2015-08-27 NOTE — ED Notes (Signed)
Pt here with sore throat, cough, body aches, vomiting since Friday.  Others in job have same.

## 2015-08-27 NOTE — ED Provider Notes (Signed)
CSN: [id number]     Arrival date & time 08/27/15  [id number] History   First MD Initiated Contact with Patient 08/27/15 [phone]     Chief Complaint  Patient presents with  . Generalized Body Aches  . Cough     (Consider location/radiation/quality/duration/timing/severity/associated sxs/prior Treatment) Patient is a 50 y.o. female presenting with general illness. The history is provided by the patient.  Illness Severity:  Moderate Onset quality:  Sudden Duration:  2 days Timing:  Constant Progression:  Worsening Chronicity:  New Associated symptoms: congestion, cough, fever, myalgias and vomiting   Associated symptoms: no chest pain, no headaches, no nausea, no rhinorrhea, no shortness of breath and no wheezing     50 yo F with a chief complaints of cough congestion fevers and myalgias. This been going on for about 3 or 4 days. Patient has been exposed to similar illness by other members at work. Patient has had a couple episodes of vomiting but has been able to tolerate by mouth at home without difficulty. Denies any abdominal pain. Patient denies any chest pain or shortness of breath. Denies any sinus tenderness. Has a mild sore throat. Cough is worse at night when she lays back flat and feels that she has significant postnasal drip.  Past Medical History  Diagnosis Date  . Hypertension   . Fibroids   . Hx MRSA infection   . Insomnia due to medical condition   . Hx-TIA (transient ischemic attack)     permanent insomnia   Past Surgical History  Procedure Laterality Date  . Wisdom tooth extraction     Family History  Problem Relation Age of Onset  . Anesthesia problems Neg Hx   . Hypertension Mother   . Diabetes Sister   . Asthma Brother   . Diabetes Brother    Social History  Substance Use Topics  . Smoking status: Current Every Day Smoker -- 1.00 packs/day    Types: Cigarettes  . Smokeless tobacco: Never Used  . Alcohol Use: Yes     Comment: weekends   OB History    Gravida Para Term Preterm AB TAB SAB Ectopic Multiple Living   4 0 0 0 4 0 4 0 0 0      Review of Systems  Constitutional: Positive for fever and chills.  HENT: Positive for congestion. Negative for rhinorrhea.   Eyes: Negative for redness and visual disturbance.  Respiratory: Positive for cough. Negative for shortness of breath and wheezing.   Cardiovascular: Negative for chest pain and palpitations.  Gastrointestinal: Positive for vomiting. Negative for nausea.  Genitourinary: Negative for dysuria and urgency.  Musculoskeletal: Positive for myalgias. Negative for arthralgias.  Skin: Negative for pallor and wound.  Neurological: Negative for dizziness and headaches.      Allergies  Penicillins; Sulfonamide derivatives; and Flagyl  Home Medications   Prior to Admission medications   Medication Sig Start Date End Date Taking? Authorizing Provider  amLODipine (NORVASC) 10 MG tablet Take 10 mg by mouth daily.    Historical Provider, MD  azithromycin (ZITHROMAX Z-PAK) 250 MG tablet 2 po day one, then 1 daily x 4 days 02/28/15   Margarita Mail, PA-C  benzonatate (TESSALON) 100 MG capsule Take 1 capsule (100 mg total) by mouth every 8 (eight) hours. 08/27/15   Deno Etienne, DO  hydrochlorothiazide (HYDRODIURIL) 25 MG tablet Take 25 mg by mouth daily.    Historical Provider, MD  ibuprofen (ADVIL,MOTRIN) 200 MG tablet Take 800 mg by mouth every 6 (six) hours as  needed.    Historical Provider, MD  Ibuprofen-Diphenhydramine Cit (ADVIL PM PO) Take 1 capsule by mouth at bedtime.    Historical Provider, MD  traZODone (DESYREL) 100 MG tablet Take 100 mg by mouth at bedtime.    Historical Provider, MD   BP 109/76 mmHg  Pulse 79  Temp(Src) 98 F (36.7 C) (Oral)  Resp 18  SpO2 98% Physical Exam  Constitutional: She is oriented to person, place, and time. She appears well-developed and well-nourished. No distress.  HENT:  Head: Normocephalic and atraumatic.  Swollen turbinates, posterior nasal  drip. No noted sinus tenderness to percussion. TMs are normal bilaterally.  Eyes: EOM are normal. Pupils are equal, round, and reactive to light.  Neck: Normal range of motion. Neck supple.  Cardiovascular: Normal rate and regular rhythm.  Exam reveals no gallop and no friction rub.   No murmur heard. Pulmonary/Chest: Effort normal. No respiratory distress. She has no wheezes. She has no rales.  Abdominal: Soft. She exhibits no distension. There is no tenderness. There is no rebound and no guarding.  Musculoskeletal: She exhibits no edema or tenderness.  Neurological: She is alert and oriented to person, place, and time.  Skin: Skin is warm and dry. She is not diaphoretic.  Psychiatric: She has a normal mood and affect. Her behavior is normal.  Nursing note and vitals reviewed.   ED Course  Procedures (including critical care time) Labs Review Labs Reviewed - No data to display  Imaging Review No results found. I have personally reviewed and evaluated these images and lab results as part of my medical decision-making.   EKG Interpretation None      MDM   Final diagnoses:  Influenza-like illness    50 yo F with a chief complaint of flulike symptoms. Patient with no significant findings on exam. Clear lung sounds no difficulty breathing. No signs of sinusitis or otitis media. Mild erythema to the posterior oropharynx. No noted tonsillar swelling or exudates. Patient will be treated symptomatically at home. Will follow with her PCP.  8:51 AM:  I have discussed the diagnosis/risks/treatment options with the patient and believe the pt to be eligible for discharge home to follow-up with PCP. We also discussed returning to the ED immediately if new or worsening sx occur. We discussed the sx which are most concerning (e.g., sudden worsening sob, inability to tolerate by mouth) that necessitate immediate return. Medications administered to the patient during their visit and any new  prescriptions provided to the patient are listed below.  Medications given during this visit Medications - No data to display  New Prescriptions   BENZONATATE (TESSALON) 100 MG CAPSULE    Take 1 capsule (100 mg total) by mouth every 8 (eight) hours.    The patient appears reasonably screen and/or stabilized for discharge and I doubt any other medical condition or other [facility] requiring further screening, evaluation, or treatment in the ED at this time prior to discharge.      Deno Etienne, DO 08/27/15 [phone]

## 2015-08-27 NOTE — Discharge Instructions (Signed)
Take tylenol 2 pills 4 times a day and motrin 4 pills 3 times a day.  Drink plenty of fluids.  Return for worsening shortness of breath, headache, confusion. Follow up with your family doctor.   Influenza, Adult Influenza ("the flu") is a viral infection of the respiratory tract. It occurs more often in winter months because people spend more time in close contact with one another. Influenza can make you feel very sick. Influenza easily spreads from person to person (contagious). CAUSES  Influenza is caused by a virus that infects the respiratory tract. You can catch the virus by breathing in droplets from an infected person's cough or sneeze. You can also catch the virus by touching something that was recently contaminated with the virus and then touching your mouth, nose, or eyes. RISKS AND COMPLICATIONS You may be at risk for a more severe case of influenza if you smoke cigarettes, have diabetes, have chronic heart disease (such as heart failure) or lung disease (such as asthma), or if you have a weakened immune system. Elderly people and pregnant women are also at risk for more serious infections. The most common problem of influenza is a lung infection (pneumonia). Sometimes, this problem can require emergency medical care and may be life threatening. SIGNS AND SYMPTOMS  Symptoms typically last 4 to 10 days and may include:  Fever.  Chills.  Headache, body aches, and muscle aches.  Sore throat.  Chest discomfort and cough.  Poor appetite.  Weakness or feeling tired.  Dizziness.  Nausea or vomiting. DIAGNOSIS  Diagnosis of influenza is often made based on your history and a physical exam. A nose or throat swab test can be done to confirm the diagnosis. TREATMENT  In mild cases, influenza goes away on its own. Treatment is directed at relieving symptoms. For more severe cases, your health care provider may prescribe antiviral medicines to shorten the sickness. Antibiotic medicines  are not effective because the infection is caused by a virus, not by bacteria. HOME CARE INSTRUCTIONS  Take medicines only as directed by your health care provider.  Use a cool mist humidifier to make breathing easier.  Get plenty of rest until your temperature returns to normal. This usually takes 3 to 4 days.  Drink enough fluid to keep your urine clear or pale yellow.  Cover yourmouth and nosewhen coughing or sneezing,and wash your handswellto prevent thevirusfrom spreading.  Stay homefromwork orschool untilthe fever is gonefor at least 14full day. PREVENTION  An annual influenza vaccination (flu shot) is the best way to avoid getting influenza. An annual flu shot is now routinely recommended for all adults in the [place] IF:  You experiencechest pain, yourcough worsens,or you producemore mucus.  Youhave nausea,vomiting, ordiarrhea.  Your fever returns or gets worse. SEEK IMMEDIATE MEDICAL CARE IF:  You havetrouble breathing, you become short of breath,or your skin ornails becomebluish.  You have severe painor stiffnessin the neck.  You develop a sudden headache, or pain in the face or ear.  You have nausea or vomiting that you cannot control. MAKE SURE YOU:   Understand these instructions.  Will watch your condition.  Will get help right away if you are not doing well or get worse.   This information is not intended to replace advice given to you by your health care provider. Make sure you discuss any questions you have with your health care provider.   Document Released: 07/03/2000 Document Revised: 07/27/2014 Document Reviewed: 10/05/2011 [employer] Interactive Patient Education  Education ©2016 [employer] ° °

## 2015-08-29 ENCOUNTER — Emergency Department (HOSPITAL_COMMUNITY): Payer: MEDICAID

## 2015-08-29 ENCOUNTER — Emergency Department (HOSPITAL_COMMUNITY)
Admission: EM | Admit: 2015-08-29 | Discharge: 2015-08-29 | Disposition: A | Discharge status: Home / Self Care | Payer: MEDICAID | Attending: Emergency Medicine | Admitting: Emergency Medicine

## 2015-08-29 ENCOUNTER — Encounter (HOSPITAL_COMMUNITY): Payer: Self-pay

## 2015-08-29 DIAGNOSIS — Z8673 Personal history of transient ischemic attack (TIA), and cerebral infarction without residual deficits: Secondary | ICD-10-CM | POA: Insufficient documentation

## 2015-08-29 DIAGNOSIS — R000 Tachycardia, unspecified: Secondary | ICD-10-CM | POA: Insufficient documentation

## 2015-08-29 DIAGNOSIS — Z880 Allergy status to penicillin: Secondary | ICD-10-CM | POA: Insufficient documentation

## 2015-08-29 DIAGNOSIS — Z8614 Personal history of Methicillin resistant Staphylococcus aureus infection: Secondary | ICD-10-CM | POA: Insufficient documentation

## 2015-08-29 DIAGNOSIS — F17210 Nicotine dependence, cigarettes, uncomplicated: Secondary | ICD-10-CM | POA: Insufficient documentation

## 2015-08-29 DIAGNOSIS — Z86018 Personal history of other benign neoplasm: Secondary | ICD-10-CM | POA: Insufficient documentation

## 2015-08-29 DIAGNOSIS — G4700 Insomnia, unspecified: Secondary | ICD-10-CM | POA: Insufficient documentation

## 2015-08-29 DIAGNOSIS — R231 Pallor: Secondary | ICD-10-CM | POA: Insufficient documentation

## 2015-08-29 DIAGNOSIS — J159 Unspecified bacterial pneumonia: Secondary | ICD-10-CM | POA: Insufficient documentation

## 2015-08-29 DIAGNOSIS — J189 Pneumonia, unspecified organism: Secondary | ICD-10-CM

## 2015-08-29 DIAGNOSIS — I10 Essential (primary) hypertension: Secondary | ICD-10-CM | POA: Insufficient documentation

## 2015-08-29 DIAGNOSIS — Z79899 Other long term (current) drug therapy: Secondary | ICD-10-CM | POA: Insufficient documentation

## 2015-08-29 MED ORDER — SODIUM CHLORIDE 0.9 % IV BOLUS (SEPSIS)
1000.0000 mL | Freq: Once | INTRAVENOUS | Status: AC
  Administered 2015-08-29: 1000 mL via INTRAVENOUS

## 2015-08-29 MED ORDER — DOXYCYCLINE HYCLATE 100 MG PO CAPS: 100.0000 mg | ORAL_CAPSULE | Freq: Two times a day (BID) | ORAL | Status: DC

## 2015-08-29 MED ORDER — DOXYCYCLINE HYCLATE 100 MG PO TABS
100.0000 mg | ORAL_TABLET | Freq: Once | ORAL | Status: AC
  Administered 2015-08-29: 100 mg via ORAL
  Filled 2015-08-29: qty 1

## 2015-08-29 MED ORDER — KETOROLAC TROMETHAMINE 30 MG/ML IJ SOLN
30.0000 mg | Freq: Once | INTRAMUSCULAR | Status: AC
  Administered 2015-08-29: 30 mg via INTRAVENOUS
  Filled 2015-08-29: qty 1

## 2015-08-29 MED ORDER — ACETAMINOPHEN 325 MG PO TABS: 650.0000 mg | ORAL_TABLET | Freq: Four times a day (QID) | ORAL | Status: DC | PRN

## 2015-08-29 MED ORDER — ACETAMINOPHEN 500 MG PO TABS
1000.0000 mg | ORAL_TABLET | Freq: Once | ORAL | Status: AC
  Administered 2015-08-29: 1000 mg via ORAL
  Filled 2015-08-29: qty 2

## 2015-08-29 MED ORDER — ONDANSETRON HCL 4 MG/2ML IJ SOLN
4.0000 mg | Freq: Once | INTRAMUSCULAR | Status: AC
  Administered 2015-08-29: 4 mg via INTRAVENOUS
  Filled 2015-08-29: qty 2

## 2015-08-29 NOTE — ED Provider Notes (Signed)
CSN: [id number]     Arrival date & time 08/29/15  1427 History   First MD Initiated Contact with Patient 08/29/15 1519     Chief Complaint  Patient presents with  . Chills  . Nasal Congestion  . Cough   HPI   Karen Ochoa is an 50 y.o. female with h/o HTN who presents to the ED for evaluation of body aches, cough, congestion, chills. She was evaluated in the ED two days ago for same and was diagnosed with viral illness and prescribed tessalon and mucinex. She reports no relief in symptoms. She states her symptoms began six days ago and have progressively gotten worse and worse. She reports 10/10 diffuse body aches. She reports a cough that is intermittently dry and intermittently productive. Endorses associated nausea but denies emesis. endorses decreased appetite and decrease PO intake. Denies abd pain, diarrhea, urinary urgency/frequency, dysuria, vaginal complaints. Denies chest pain or SOB. Denies recent travel or sick contacts.  Past Medical History  Diagnosis Date  . Hypertension   . Fibroids   . Hx MRSA infection   . Insomnia due to medical condition   . Hx-TIA (transient ischemic attack)     permanent insomnia   Past Surgical History  Procedure Laterality Date  . Wisdom tooth extraction     Family History  Problem Relation Age of Onset  . Anesthesia problems Neg Hx   . Hypertension Mother   . Diabetes Sister   . Asthma Brother   . Diabetes Brother    Social History  Substance Use Topics  . Smoking status: Current Every Day Smoker -- 1.00 packs/day    Types: Cigarettes  . Smokeless tobacco: Never Used  . Alcohol Use: Yes     Comment: weekends   OB History    Gravida Para Term Preterm AB TAB SAB Ectopic Multiple Living   4 0 0 0 4 0 4 0 0 0      Review of Systems  All other systems reviewed and are negative.     Allergies  Penicillins; Sulfonamide derivatives; and Flagyl  Home Medications   Prior to Admission medications   Medication Sig Start Date End Date  Taking? Authorizing Provider  amLODipine (NORVASC) 10 MG tablet Take 10 mg by mouth daily.    Historical Provider, MD  azithromycin (ZITHROMAX Z-PAK) 250 MG tablet 2 po day one, then 1 daily x 4 days 02/28/15   Margarita Mail, PA-C  benzonatate (TESSALON) 100 MG capsule Take 1 capsule (100 mg total) by mouth every 8 (eight) hours. 08/27/15   Deno Etienne, DO  hydrochlorothiazide (HYDRODIURIL) 25 MG tablet Take 25 mg by mouth daily.    Historical Provider, MD  ibuprofen (ADVIL,MOTRIN) 200 MG tablet Take 800 mg by mouth every 6 (six) hours as needed.    Historical Provider, MD  Ibuprofen-Diphenhydramine Cit (ADVIL PM PO) Take 1 capsule by mouth at bedtime.    Historical Provider, MD  traZODone (DESYREL) 100 MG tablet Take 100 mg by mouth at bedtime.    Historical Provider, MD   BP 144/94 mmHg  Pulse 115  Temp(Src) 97.9 F (36.6 C) (Oral)  Resp 18  SpO2 96% Physical Exam  Constitutional: She is oriented to person, place, and time. She appears ill.  Tearful, appears uncomfortable.  HENT:  Right Ear: External ear normal.  Left Ear: External ear normal.  Nose: Mucosal edema and rhinorrhea present.  Mouth/Throat: Oropharynx is clear and moist. No oropharyngeal exudate.  Posterior oropharyngeal erythema. No edema. Uvula midline. No  tonsillar exudate.  Eyes: Conjunctivae and EOM are normal. Pupils are equal, round, and reactive to light.  Neck: Normal range of motion. Neck supple.  Cardiovascular: Regular rhythm, normal heart sounds and intact distal pulses.  Tachycardia present.   Pulmonary/Chest: Effort normal and breath sounds normal. No respiratory distress. She has no wheezes. She has no rales. She exhibits no tenderness.  Abdominal: Soft. Bowel sounds are normal. She exhibits no distension. There is no tenderness. There is no rigidity, no rebound, no guarding and no CVA tenderness.  Musculoskeletal: She exhibits no edema or tenderness.  Lymphadenopathy:    She has no cervical adenopathy.   Neurological: She is alert and oriented to person, place, and time. No cranial nerve deficit.  Skin: Skin is warm and dry. There is pallor.  Psychiatric: She has a normal mood and affect.  Nursing note and vitals reviewed.   ED Course  Procedures (including critical care time) Labs Review Labs Reviewed - No data to display  Imaging Review Dg Chest 2 View  08/29/2015  CLINICAL DATA:  Cough for 1 week EXAM: CHEST  2 VIEW COMPARISON:  April 26, 2009 FINDINGS: There is patchy infiltrate in the right mid and lower lung zones. Lungs elsewhere are clear. Heart size and pulmonary vascular normal. No adenopathy. No bone lesions. IMPRESSION: Patchy infiltrate right mid and lower lung zones, consistent with pneumonia. Lungs elsewhere clear. Cardiac silhouette within normal limits. Followup PA and lateral chest radiographs recommended in 3-4 weeks following trial of antibiotic therapy to ensure resolution and exclude underlying malignancy. Electronically Signed   By: Lowella Grip III M.D.   On: 08/29/2015 16:15   I have personally reviewed and evaluated these images and lab results as part of my medical decision-making.   EKG Interpretation None      MDM   Final diagnoses:  Community acquired pneumonia    Given duration of symptoms, pt's clinical appearance, will order CXR to r/o pneumonia though I hear no adventitious lung sounds and she is not hypoxic or tachypneic. She is tachycardic to 115 which I suspect is due to pain and dehydration. She is currently afebrile. Denies chest pain or SOB. 1L NS bolus, toradol, zofran, and tylenol given.   CXR shows right mid and lower infiltrate consistent with pneumonia. Rx for doxycycline given. Resource guide given for PCP f/u. Instructed to take tylenol prn pain/fever as well. Instructed to get outpatient f/u CXR in 3-4 weeks. Pt is otherwise afebrile, no hypoxia, normotensive. Stable for outpatient management. ER return precautions given.   Anne Ng, PA-C 08/29/15 1746  Leonard Schwartz, MD 08/29/15 [phone]

## 2015-08-29 NOTE — Discharge Instructions (Signed)
Your chest x-ray shows evidence of pneumonia. I will give a you prescription for one week of antibiotics. Please follow up with your primary care provider within one week. You will also need a repeat chest x-ray in 3-4 weeks. If you do not have one please call one from the list below. You may take tylenol for pain or fever. Return to the ER for new or worsening symptoms.   Community-Acquired Pneumonia, Adult Pneumonia is an infection of the lungs. There are different types of pneumonia. One type can develop while a person is in a hospital. A different type, called community-acquired pneumonia, develops in people who are not, or have not recently been, in the hospital or other health care facility.  CAUSES Pneumonia may be caused by bacteria, viruses, or funguses. Community-acquired pneumonia is often caused by Streptococcus pneumonia bacteria. These bacteria are often passed from one person to another by breathing in droplets from the cough or sneeze of an infected person. RISK FACTORS The condition is more likely to develop in:  People who havechronic diseases, such as chronic obstructive pulmonary disease (COPD), asthma, congestive heart failure, cystic fibrosis, diabetes, or kidney disease.  People who haveearly-stage or late-stage HIV.  People who havesickle cell disease.  People who havehad their spleen removed (splenectomy).  People who havepoor Human resources officer.  People who havemedical conditions that increase the risk of breathing in (aspirating) secretions their own mouth and nose.   People who havea weakened immune system (immunocompromised).  People who smoke.  People whotravel to areas where pneumonia-causing germs commonly exist.  People whoare around animal habitats or animals that have pneumonia-causing germs, including birds, bats, rabbits, cats, and farm animals. SYMPTOMS Symptoms of this condition include:  Adry cough.  A wet (productive)  cough.  Fever.  Sweating.  Chest pain, especially when breathing deeply or coughing.  Rapid breathing or difficulty breathing.  Shortness of breath.  Shaking chills.  Fatigue.  Muscle aches. DIAGNOSIS Your health care provider will take a medical history and perform a physical exam. You may also have other tests, including:  Imaging studies of your chest, including X-rays.  Tests to check your blood oxygen level and other blood gases.  Other tests on blood, mucus (sputum), fluid around your lungs (pleural fluid), and urine. If your pneumonia is severe, other tests may be done to identify the specific cause of your illness. TREATMENT The type of treatment that you receive depends on many factors, such as the cause of your pneumonia, the medicines you take, and other medical conditions that you have. For most adults, treatment and recovery from pneumonia may occur at home. In some cases, treatment must happen in a hospital. Treatment may include:  Antibiotic medicines, if the pneumonia was caused by bacteria.  Antiviral medicines, if the pneumonia was caused by a virus.  Medicines that are given by mouth or through an IV tube.  Oxygen.  Respiratory therapy. Although rare, treating severe pneumonia may include:  Mechanical ventilation. This is done if you are not breathing well on your own and you cannot maintain a safe blood oxygen level.  Thoracentesis. This procedureremoves fluid around one lung or both lungs to help you breathe better. HOME CARE INSTRUCTIONS  Take over-the-counter and prescription medicines only as told by your health care provider.  Only takecough medicine if you are losing sleep. Understand that cough medicine can prevent your body's natural ability to remove mucus from your lungs.  If you were prescribed an antibiotic medicine, take it  as told by your health care provider. Do not stop taking the antibiotic even if you start to feel  better.  Sleep in a semi-upright position at night. Try sleeping in a reclining chair, or place a few pillows under your head.  Do not use tobacco products, including cigarettes, chewing tobacco, and e-cigarettes. If you need help quitting, ask your health care provider.  Drink enough water to keep your urine clear or pale yellow. This will help to thin out mucus secretions in your lungs. PREVENTION There are ways that you can decrease your risk of developing community-acquired pneumonia. Consider getting a pneumococcal vaccine if:  You are older than 52 years of age.  You are older than 52 years of age and are undergoing cancer treatment, have chronic lung disease, or have other medical conditions that affect your immune system. Ask your health care provider if this applies to you. There are different types and schedules of pneumococcal vaccines. Ask your health care provider which vaccination option is best for you. You may also prevent community-acquired pneumonia if you take these actions:  Get an influenza vaccine every year. Ask your health care provider which type of influenza vaccine is best for you.  Go to the dentist on a regular basis.  Wash your hands often. Use hand sanitizer if soap and water are not available. SEEK MEDICAL CARE IF:  You have a fever.  You are losing sleep because you cannot control your cough with cough medicine. SEEK IMMEDIATE MEDICAL CARE IF:  You have worsening shortness of breath.  You have increased chest pain.  Your sickness becomes worse, especially if you are an older adult or have a weakened immune system.  You cough up blood.   This information is not intended to replace advice given to you by your health care provider. Make sure you discuss any questions you have with your health care provider.   Document Released: 07/06/2005 Document Revised: 03/27/2015 Document Reviewed: 10/31/2014 Elsevier Interactive Patient Education [employer]

## 2015-08-29 NOTE — ED Notes (Signed)
Patient transported to X-ray 

## 2015-08-29 NOTE — ED Notes (Addendum)
Pt c/o chills, nasal congestion, cough, generalized body aches, and nausea x 6 days.  Pain score 8/10.  Pt reports taking Mucinex and tesselon drops.  Pt was seen x 2 days ago an diagnosed w/ flu-like illness.  Denies v/d.

## 2015-11-11 ENCOUNTER — Ambulatory Visit (HOSPITAL_COMMUNITY)
Admission: EM | Admit: 2015-11-11 | Discharge: 2015-11-11 | Disposition: A | Discharge status: Home / Self Care | Payer: Self-pay | Attending: Family Medicine | Admitting: Family Medicine

## 2015-11-11 ENCOUNTER — Encounter (HOSPITAL_COMMUNITY): Payer: Self-pay | Admitting: Emergency Medicine

## 2015-11-11 DIAGNOSIS — Z760 Encounter for issue of repeat prescription: Secondary | ICD-10-CM

## 2015-11-11 DIAGNOSIS — I10 Essential (primary) hypertension: Secondary | ICD-10-CM

## 2015-11-11 LAB — POCT I-STAT, CHEM 8
BUN: 23 mg/dL — ABNORMAL HIGH (ref 6–20)
Calcium, Ion: 1.14 mmol/L (ref 1.12–1.23)
Chloride: 105 mmol/L (ref 101–111)
Creatinine, Ser: 1 mg/dL (ref 0.44–1.00)
Glucose, Bld: 96 mg/dL (ref 65–99)
HEMATOCRIT: 44 % (ref 36.0–46.0)
HEMOGLOBIN: 15 g/dL (ref 12.0–15.0)
POTASSIUM: 3.5 mmol/L (ref 3.5–5.1)
Sodium: 141 mmol/L (ref 135–145)
TCO2: 23 mmol/L (ref 0–100)

## 2015-11-11 MED ORDER — VERAPAMIL HCL 40 MG PO TABS: ORAL_TABLET | ORAL | Status: DC

## 2015-11-11 MED ORDER — TRIAMTERENE-HCTZ 50-25 MG PO CAPS: 1.0000 | ORAL_CAPSULE | ORAL | Status: DC

## 2015-11-11 MED ORDER — METOPROLOL TARTRATE 25 MG PO TABS: ORAL_TABLET | ORAL | Status: DC

## 2015-11-11 NOTE — Discharge Instructions (Signed)
There is a risk of taking medications prescribed by a health care provider without obtaining a complete history, labwork or proper physical exam, etc. This cannot be completed adequately at an urgent care. There can be multiple problems, some serious,  associated with medications and undetermined conditions of your health status. This action is performed as a last resort in order to supply you with medication. By receiving these prescriptions you are ackowleging and accepting these risks and will not hold the prescriber or any agent of [facility] as responsible for any adverse outcomes.     Medicine Refill at the Emergency Department and Urgent Care  We have refilled your medicine today, but it is best for you to get refills through your primary health care provider's office. In the future, please plan ahead so you do not need to get refills from the emergency department. If the medicine we refilled was a maintenance medicine, you may have received only enough to get you by until you are able to see your regular health care provider.   This information is not intended to replace advice given to you by your health care provider. Make sure you discuss any questions you have with your health care provider.   Document Released: 10/23/2003 Document Revised: 07/27/2014 Document Reviewed: 10/13/2013 Elsevier Interactive Patient Education 2016 [employer].   Hypertension Hypertension, commonly called high blood pressure, is when the force of blood pumping through your arteries is too strong. Your arteries are the blood vessels that carry blood from your heart throughout your body. A blood pressure reading consists of a higher number over a lower number, such as 110/72. The higher number (systolic) is the pressure inside your arteries when your heart pumps. The lower number (diastolic) is the pressure inside your arteries when your heart relaxes. Ideally you want your blood  pressure below 120/80. Hypertension forces your heart to work harder to pump blood. Your arteries may become narrow or stiff. Having untreated or uncontrolled hypertension can cause heart attack, stroke, kidney disease, and other problems. RISK FACTORS Some risk factors for high blood pressure are controllable. Others are not.  Risk factors you cannot control include:   Race. You may be at higher risk if you are African American.  Age. Risk increases with age.  Gender. Men are at higher risk than women before age 58 years. After age 38, women are at higher risk than men. Risk factors you can control include:  Not getting enough exercise or physical activity.  Being overweight.  Getting too much fat, sugar, calories, or salt in your diet.  Drinking too much alcohol. SIGNS AND SYMPTOMS Hypertension does not usually cause signs or symptoms. Extremely high blood pressure (hypertensive crisis) may cause headache, anxiety, shortness of breath, and nosebleed. DIAGNOSIS To check if you have hypertension, your health care provider will measure your blood pressure while you are seated, with your arm held at the level of your heart. It should be measured at least twice using the same arm. Certain conditions can cause a difference in blood pressure between your right and left arms. A blood pressure reading that is higher than normal on one occasion does not mean that you need treatment. If it is not clear whether you have high blood pressure, you may be asked to return on a different day to have your blood pressure checked again. Or, you may be asked to monitor your blood pressure at home for 1 or more weeks.  TREATMENT Treating high blood pressure includes making lifestyle changes and possibly taking medicine. Living a healthy lifestyle can help lower high blood pressure. You may need to change some of your habits. Lifestyle changes may include:  Following the DASH diet. This diet is high in fruits,  vegetables, and whole grains. It is low in salt, red meat, and added sugars.  Keep your sodium intake below 2,300 mg per day.  Getting at least 30-45 minutes of aerobic exercise at least 4 times per week.  Losing weight if necessary.  Not smoking.  Limiting alcoholic beverages.  Learning ways to reduce stress. Your health care provider may prescribe medicine if lifestyle changes are not enough to get your blood pressure under control, and if one of the following is true:  You are 49-76 years of age and your systolic blood pressure is above 140.  You are 41 years of age or older, and your systolic blood pressure is above 150.  Your diastolic blood pressure is above 90.  You have diabetes, and your systolic blood pressure is over [id number] or your diastolic blood pressure is over 90.  You have kidney disease and your blood pressure is above 140/90.  You have heart disease and your blood pressure is above 140/90. Your personal target blood pressure may vary depending on your medical conditions, your age, and other factors. HOME CARE INSTRUCTIONS  Have your blood pressure rechecked as directed by your health care provider.   Take medicines only as directed by your health care provider. Follow the directions carefully. Blood pressure medicines must be taken as prescribed. The medicine does not work as well when you skip doses. Skipping doses also puts you at risk for problems.  Do not smoke.   Monitor your blood pressure at home as directed by your health care provider. SEEK MEDICAL CARE IF:   You think you are having a reaction to medicines taken.  You have recurrent headaches or feel dizzy.  You have swelling in your ankles.  You have trouble with your vision. SEEK IMMEDIATE MEDICAL CARE IF:  You develop a severe headache or confusion.  You have unusual weakness, numbness, or feel faint.  You have severe chest or abdominal pain.  You vomit repeatedly.  You have  trouble breathing. MAKE SURE YOU:   Understand these instructions.  Will watch your condition.  Will get help right away if you are not doing well or get worse.   This information is not intended to replace advice given to you by your health care provider. Make sure you discuss any questions you have with your health care provider.   Document Released: 07/06/2005 Document Revised: 11/20/2014 Document Reviewed: 04/28/2013 Elsevier Interactive Patient Education 2016 [place] Your High Blood Pressure Blood pressure is a measurement of how forceful your blood is pressing against the walls of the arteries. Arteries are muscular tubes within the circulatory system. Blood pressure does not stay the same. Blood pressure rises when you are active, excited, or nervous; and it lowers during sleep and relaxation. If the numbers measuring your blood pressure stay above normal most of the time, you are at risk for health problems. High blood pressure (hypertension) is a long-term (chronic) condition in which blood pressure is elevated. A blood pressure reading is recorded as two numbers, such as 120 over 80 (or 120/80). The first, higher number is called the systolic pressure. It is a measure of the pressure in your arteries as the heart beats. The  second, lower number is called the diastolic pressure. It is a measure of the pressure in your arteries as the heart relaxes between beats.  Keeping your blood pressure in a normal range is important to your overall health and prevention of health problems, such as heart disease and stroke. When your blood pressure is uncontrolled, your heart has to work harder than normal. High blood pressure is a very common condition in adults because blood pressure tends to rise with age. Men and women are equally likely to have hypertension but at different times in life. Before age 31, men are more likely to have hypertension. After 52 years of age, women are more  likely to have it. Hypertension is especially common in African Americans. This condition often has no signs or symptoms. The cause of the condition is usually not known. Your caregiver can help you come up with a plan to keep your blood pressure in a normal, healthy range. BLOOD PRESSURE STAGES Blood pressure is classified into four stages: normal, prehypertension, stage 1, and stage 2. Your blood pressure reading will be used to determine what type of treatment, if any, is necessary. Appropriate treatment options are tied to these four stages:  Normal  Systolic pressure (mm Hg): below 120.  Diastolic pressure (mm Hg): below 80. Prehypertension  Systolic pressure (mm Hg): 120 to 139.  Diastolic pressure (mm Hg): 80 to 89. Stage1  Systolic pressure (mm Hg): 140 to 159.  Diastolic pressure (mm Hg): 90 to 99. Stage2  Systolic pressure (mm Hg): 160 or above.  Diastolic pressure (mm Hg): 100 or above. RISKS RELATED TO HIGH BLOOD PRESSURE Managing your blood pressure is an important responsibility. Uncontrolled high blood pressure can lead to:  A heart attack.  A stroke.  A weakened blood vessel (aneurysm).  Heart failure.  Kidney damage.  Eye damage.  Metabolic syndrome.  Memory and concentration problems. HOW TO MANAGE YOUR BLOOD PRESSURE Blood pressure can be managed effectively with lifestyle changes and medicines (if needed). Your caregiver will help you come up with a plan to bring your blood pressure within a normal range. Your plan should include the following: Education  Read all information provided by your caregivers about how to control blood pressure.  Educate yourself on the latest guidelines and treatment recommendations. New research is always being done to further define the risks and treatments for high blood pressure. Lifestylechanges  Control your weight.  Avoid smoking.  Stay physically active.  Reduce the amount of salt in your  diet.  Reduce stress.  Control any chronic conditions, such as high cholesterol or diabetes.  Reduce your alcohol intake. Medicines  Several medicines (antihypertensive medicines) are available, if needed, to bring blood pressure within a normal range. Communication  Review all the medicines you take with your caregiver because there may be side effects or interactions.  Talk with your caregiver about your diet, exercise habits, and other lifestyle factors that may be contributing to high blood pressure.  See your caregiver regularly. Your caregiver can help you create and adjust your plan for managing high blood pressure. RECOMMENDATIONS FOR TREATMENT AND FOLLOW-UP  The following recommendations are based on current guidelines for managing high blood pressure in nonpregnant adults. Use these recommendations to identify the proper follow-up period or treatment option based on your blood pressure reading. You can discuss these options with your caregiver.  Systolic pressure of 123456 to [id number] or diastolic pressure of 80 to 89: Follow up with your caregiver as directed.  Systolic pressure of [id number] to 0000000 or diastolic pressure of 90 to 100: Follow up with your caregiver within 2 months.  Systolic pressure above 0000000 or diastolic pressure above [id number]: Follow up with your caregiver within 1 month.  Systolic pressure above [id number] or diastolic pressure above [id number]: Consider antihypertensive therapy; follow up with your caregiver within 1 week.  Systolic pressure above [id number] or diastolic pressure above 123456: Begin antihypertensive therapy; follow up with your caregiver within 1 week.   This information is not intended to replace advice given to you by your health care provider. Make sure you discuss any questions you have with your health care provider.   Document Released: 03/30/2012 Document Reviewed: 03/30/2012 Elsevier Interactive Patient Education [employer].

## 2015-11-11 NOTE — ED Provider Notes (Signed)
CSN: [id number]     Arrival date & time 11/11/15  1403 History   First MD Initiated Contact with Patient 11/11/15 1504     Chief Complaint  Patient presents with  . Hypertension    med refill   (Consider location/radiation/quality/duration/timing/severity/associated sxs/prior Treatment) HPI Comments: 50 year old female presents to the urgent care with a history of hypertension and requesting refills of her antihypertensives. She brings in 2 bottles of medication and states that she takes a third one. The 2 bottles in her presence includes metoprolol 25 mg one half tablet a day and verapamil 80 mg one half tablet twice a day. In the past medical history her medications also include triamterene, amlodipine and hydrochlorothiazide. She states that she had been taking her 3 blood pressure medicines for a year. Since she does not have a way to pay for her doctor visits she is unable to follow up with her PCP to obtain her medications. Denies chest pain, shortness of breath, edema or palpitations.   Past Medical History  Diagnosis Date  . Hypertension   . Fibroids   . Hx MRSA infection   . Insomnia due to medical condition   . Hx-TIA (transient ischemic attack)     permanent insomnia   Past Surgical History  Procedure Laterality Date  . Wisdom tooth extraction     Family History  Problem Relation Age of Onset  . Anesthesia problems Neg Hx   . Hypertension Mother   . Diabetes Sister   . Asthma Brother   . Diabetes Brother    Social History  Substance Use Topics  . Smoking status: Current Every Day Smoker -- 1.00 packs/day    Types: Cigarettes  . Smokeless tobacco: Never Used  . Alcohol Use: Yes     Comment: weekends   OB History    Gravida Para Term Preterm AB TAB SAB Ectopic Multiple Living   4 0 0 0 4 0 4 0 0 0      Review of Systems  Constitutional: Negative.   HENT: Negative.   Respiratory: Negative for cough and shortness of breath.   Cardiovascular: Negative for  chest pain, palpitations and leg swelling.  Gastrointestinal: Negative.   Genitourinary: Negative.   Neurological: Negative.     Allergies  Penicillins; Sulfonamide derivatives; and Flagyl  Home Medications   Prior to Admission medications   Medication Sig Start Date End Date Taking? Authorizing Provider  acetaminophen (TYLENOL) 325 MG tablet Take 2 tablets (650 mg total) by mouth every 6 (six) hours as needed for fever. 08/29/15  Yes Olivia Canter Sam, PA-C  benzonatate (TESSALON) 100 MG capsule Take 1 capsule (100 mg total) by mouth every 8 (eight) hours. 08/27/15  Yes Deno Etienne, DO  doxycycline (VIBRAMYCIN) 100 MG capsule Take 1 capsule (100 mg total) by mouth 2 (two) times daily. 08/29/15  Yes Olivia Canter Sam, PA-C  ibuprofen (ADVIL,MOTRIN) 200 MG tablet Take 800 mg by mouth every 6 (six) hours as needed for headache or moderate pain.    Yes Historical Provider, MD  Ibuprofen-Diphenhydramine Cit (ADVIL PM PO) Take 1 capsule by mouth at bedtime.   Yes Historical Provider, MD  METOPROLOL SUCCINATE ER PO Take 25 mg by mouth. Half 1/2 a tablet daily   Yes Historical Provider, MD  traZODone (DESYREL) 100 MG tablet Take 100 mg by mouth at bedtime as needed for sleep.    Yes Historical Provider, MD  metoprolol (LOPRESSOR) 25 MG tablet Take 1/2 tab bid 11/11/15   Janne Napoleon,  NP  triamterene-hydrochlorothiazide (DYAZIDE) 50-25 MG capsule Take 1 capsule by mouth every morning. 11/11/15   Janne Napoleon, NP  verapamil (CALAN) 40 MG tablet Take 1/2 tab po bid 11/11/15   Janne Napoleon, NP   Meds Ordered and Administered this Visit  Medications - No data to display  BP 178/102 mmHg  Pulse 77  Temp(Src) 98.2 F (36.8 C) (Oral)  Resp 16  SpO2 100% No data found.   Physical Exam  Constitutional: She is oriented to person, place, and time. She appears well-developed and well-nourished. No distress.  Eyes: Conjunctivae and EOM are normal.  Neck: Normal range of motion. Neck supple.  Cardiovascular: Normal rate,  regular rhythm and normal heart sounds.   Pulmonary/Chest: Effort normal and breath sounds normal. No respiratory distress. She has no wheezes. She has no rales.  Musculoskeletal: She exhibits no edema or tenderness.  Neurological: She is alert and oriented to person, place, and time. She exhibits normal muscle tone.  Skin: Skin is warm and dry.  Psychiatric: She has a normal mood and affect.  Nursing note and vitals reviewed.   ED Course  Procedures (including critical care time)  Labs Review Labs Reviewed  POCT I-STAT, CHEM 8 - Abnormal; Notable for the following:    BUN 23 (*)    All other components within normal limits   Results for orders placed or performed during the hospital encounter of 11/11/15  I-STAT, chem 8  Result Value Ref Range   Sodium 141 135 - 145 mmol/L   Potassium 3.5 3.5 - 5.1 mmol/L   Chloride 105 101 - 111 mmol/L   BUN 23 (H) 6 - 20 mg/dL   Creatinine, Ser 1.00 0.44 - 1.00 mg/dL   Glucose, Bld 96 65 - 99 mg/dL   Calcium, Ion 1.14 1.12 - 1.23 mmol/L   TCO2 23 0 - 100 mmol/L   Hemoglobin 15.0 12.0 - 15.0 g/dL   HCT 44.0 36.0 - 46.0 %     Imaging Review No results found.   Visual Acuity Review  Right Eye Distance:   Left Eye Distance:   Bilateral Distance:    Right Eye Near:   Left Eye Near:    Bilateral Near:         MDM   1. Essential hypertension   2. Encounter for medication refill     There is a risk of taking medications prescribed by a health care provider without obtaining a complete history, labwork or proper physical exam, etc. This cannot be completed adequately at an urgent care. There can be multiple problems, some serious,  associated with medications and undetermined conditions of your health status. This action is performed as a last resort in order to supply you with medication. By receiving these prescriptions you are ackowleging and accepting these risks and will not hold the prescriber or any agent of [facility] as responsible for any adverse outcomes.   Meds ordered this encounter  Medications  . DISCONTD: VERAPAMIL HCL PO    Sig: Take 80 mg by mouth. 1/2half a tablet twice daily  . METOPROLOL SUCCINATE ER PO    Sig: Take 25 mg by mouth. Half 1/2 a tablet daily  . DISCONTD: TRIAMTERENE PO    Sig: Take by mouth.  . DISCONTD: triamterene-hydrochlorothiazide (DYAZIDE) 37.5-25 MG capsule    Sig: Take 1 capsule by mouth daily.  . metoprolol (LOPRESSOR) 25 MG tablet    Sig: Take 1/2 tab bid  Dispense:  30 tablet    Refill:  0    Order Specific Question:  Supervising Provider    Answer:  Billy Fischer [phone]  . triamterene-hydrochlorothiazide (DYAZIDE) 50-25 MG capsule    Sig: Take 1 capsule by mouth every morning.    Dispense:  30 capsule    Refill:  0    Order Specific Question:  Supervising Provider    Answer:  Billy Fischer [phone]  . verapamil (CALAN) 40 MG tablet    Sig: Take 1/2 tab po bid    Dispense:  30 tablet    Refill:  0    Order Specific Question:  Supervising Provider    Answer:  Ihor Gully D [[id number]]   Verapamil dose decrease in dosage due to cardiovascular risk when combined with betablockers. F/U with PCP ASAP.     Janne Napoleon, NP 11/11/15 [phone]

## 2015-11-11 NOTE — ED Notes (Signed)
Here for med refill States she has no pcp Insurance starts in June Would like refills to last til June til appt

## 2015-11-13 ENCOUNTER — Telehealth (HOSPITAL_COMMUNITY): Payer: Self-pay | Admitting: Emergency Medicine

## 2015-11-13 NOTE — ED Notes (Signed)
Pt called notifying us that she was unable to pick up Dyazide 50-25 mg caps... Called [facility])... They state it is more expensive in caps than tabs and wanted to know if provider can change to tabs instead... Per Youlanda Roys, NP, ok to change... Called pt and notified her that the change has been made.

## 2015-11-16 ENCOUNTER — Telehealth (HOSPITAL_COMMUNITY): Payer: Self-pay | Admitting: Emergency Medicine

## 2015-11-16 NOTE — ED Notes (Signed)
Pharmacy called again.... Wants to know if pt can have the Dyazide 37.5/25 instead of the 50/25 since it would be cheaper Per Dr. Bridgett Larsson... Ok to change w/same SIG

## 2015-12-17 ENCOUNTER — Ambulatory Visit (HOSPITAL_COMMUNITY)
Admission: EM | Admit: 2015-12-17 | Discharge: 2015-12-17 | Disposition: A | Discharge status: Home / Self Care | Payer: Self-pay | Attending: Family Medicine | Admitting: Family Medicine

## 2015-12-17 ENCOUNTER — Encounter (HOSPITAL_COMMUNITY): Payer: Self-pay | Admitting: Emergency Medicine

## 2015-12-17 DIAGNOSIS — I10 Essential (primary) hypertension: Secondary | ICD-10-CM

## 2015-12-17 MED ORDER — VERAPAMIL HCL ER 120 MG PO TBCR: 120.0000 mg | EXTENDED_RELEASE_TABLET | Freq: Every day | ORAL | Status: DC

## 2015-12-17 MED ORDER — METOPROLOL TARTRATE 25 MG PO TABS: 25.0000 mg | ORAL_TABLET | Freq: Two times a day (BID) | ORAL | Status: DC

## 2015-12-17 MED ORDER — TRIAMTERENE-HCTZ 50-25 MG PO CAPS: 1.0000 | ORAL_CAPSULE | ORAL | Status: DC

## 2015-12-17 NOTE — Discharge Instructions (Signed)
Take your medicine every day, see your doctor for refills.

## 2015-12-17 NOTE — ED Provider Notes (Signed)
CSN: [id number]     Arrival date & time 12/17/15  1758 History   First MD Initiated Contact with Patient 12/17/15 1915     Chief Complaint  Patient presents with  . Hypertension  . Gastroesophageal Reflux  . Medication Refill   (Consider location/radiation/quality/duration/timing/severity/associated sxs/prior Treatment) Patient is a 50 y.o. female presenting with hypertension. The history is provided by the patient.  Hypertension This is a chronic problem. The current episode started more than 2 days ago (out of bp meds for 5d, although no lmd at this time.). The problem has not changed since onset.Associated symptoms include headaches. Pertinent negatives include no chest pain and no shortness of breath.    Past Medical History  Diagnosis Date  . Hypertension   . Fibroids   . Hx MRSA infection   . Insomnia due to medical condition   . Hx-TIA (transient ischemic attack)     permanent insomnia   Past Surgical History  Procedure Laterality Date  . Wisdom tooth extraction     Family History  Problem Relation Age of Onset  . Anesthesia problems Neg Hx   . Hypertension Mother   . Diabetes Sister   . Asthma Brother   . Diabetes Brother    Social History  Substance Use Topics  . Smoking status: Current Every Day Smoker -- 1.00 packs/day    Types: Cigarettes  . Smokeless tobacco: Never Used  . Alcohol Use: Yes     Comment: weekends   OB History    Gravida Para Term Preterm AB TAB SAB Ectopic Multiple Living   4 0 0 0 4 0 4 0 0 0      Review of Systems  Constitutional: Negative.   Respiratory: Negative for shortness of breath and wheezing.   Cardiovascular: Negative for chest pain and leg swelling.  Neurological: Positive for headaches.  All other systems reviewed and are negative.   Allergies  Penicillins; Sulfonamide derivatives; and Flagyl  Home Medications   Prior to Admission medications   Medication Sig Start Date End Date Taking? Authorizing Provider   acetaminophen (TYLENOL) 325 MG tablet Take 2 tablets (650 mg total) by mouth every 6 (six) hours as needed for fever. 08/29/15   Olivia Canter Sam, PA-C  benzonatate (TESSALON) 100 MG capsule Take 1 capsule (100 mg total) by mouth every 8 (eight) hours. 08/27/15   Deno Etienne, DO  doxycycline (VIBRAMYCIN) 100 MG capsule Take 1 capsule (100 mg total) by mouth 2 (two) times daily. 08/29/15   Olivia Canter Sam, PA-C  ibuprofen (ADVIL,MOTRIN) 200 MG tablet Take 800 mg by mouth every 6 (six) hours as needed for headache or moderate pain.     Historical Provider, MD  Ibuprofen-Diphenhydramine Cit (ADVIL PM PO) Take 1 capsule by mouth at bedtime.    Historical Provider, MD  metoprolol (LOPRESSOR) 25 MG tablet Take 1 tablet (25 mg total) by mouth 2 (two) times daily. 12/17/15   Billy Fischer, MD  METOPROLOL SUCCINATE ER PO Take 25 mg by mouth. Half 1/2 a tablet daily    Historical Provider, MD  traZODone (DESYREL) 100 MG tablet Take 100 mg by mouth at bedtime as needed for sleep.     Historical Provider, MD  triamterene-hydrochlorothiazide (DYAZIDE) 50-25 MG capsule Take 1 capsule by mouth every morning. 12/17/15   Billy Fischer, MD  verapamil (CALAN-SR) 120 MG CR tablet Take 1 tablet (120 mg total) by mouth at bedtime. 12/17/15   Billy Fischer, MD   Meds Ordered and  Administered this Visit  Medications - No data to display  BP 170/110 mmHg  Pulse 131  Temp(Src) 98.3 F (36.8 C) (Oral)  Resp 20  SpO2 98% No data found.   Physical Exam  Constitutional: She is oriented to person, place, and time. She appears well-developed and well-nourished.  Neck: Normal range of motion. Neck supple.  Cardiovascular: Regular rhythm, intact distal pulses and normal pulses.  Tachycardia present.   Pulmonary/Chest: Effort normal and breath sounds normal.  Musculoskeletal: She exhibits no edema.  Lymphadenopathy:    She has no cervical adenopathy.  Neurological: She is alert and oriented to person, place, and time.  Skin: Skin is  warm and dry.  Nursing note and vitals reviewed.   ED Course  Procedures (including critical care time)  Labs Review Labs Reviewed - No data to display  Imaging Review No results found.   Visual Acuity Review  Right Eye Distance:   Left Eye Distance:   Bilateral Distance:    Right Eye Near:   Left Eye Near:    Bilateral Near:         MDM   1. Essential hypertension        Billy Fischer, MD 12/17/15 [phone]

## 2015-12-17 NOTE — ED Notes (Signed)
The patient presented to the [facility] with a complaint of htn and reflux x 5 days. The patient stated that she has been out of her 3 HTN meds for 5 days.

## 2015-12-18 ENCOUNTER — Telehealth (HOSPITAL_COMMUNITY): Payer: Self-pay | Admitting: Emergency Medicine

## 2015-12-18 NOTE — ED Notes (Signed)
Pt called back and wanted to know if she can get a work extension since she did not go to work today b/c she did not feel any better and was running around from pharmacy to pharmacy trying to get meds problem resolved.  Per Dr Juventino Slovak... Ok to extend for one day and return tomorrow.  Left work note at the front desk for pt to p/u... Pt verb understanding.

## 2015-12-18 NOTE — ED Notes (Signed)
Pt called in re to medications she received yest (5/30) here at the [facility] told her they do not carry one of the BP meds and she thought it was Metoprolol 25 mg Spoke w/Dr. Juventino Slovak who knew about the situation... Reports Baldo Ash, RN spoke already w/the pharmacy and it was Dyazide that they don't carry anymore... But HCTZ was called in instead.  Adv pt she needs to p/u the Rx's asap and take them as instructed... Told pt to call if she is having trouble w/Rx's  She verb understanding.

## 2016-01-12 ENCOUNTER — Emergency Department (HOSPITAL_COMMUNITY)
Admission: EM | Admit: 2016-01-12 | Discharge: 2016-01-12 | Disposition: A | Discharge status: Home / Self Care | Payer: BLUE CROSS/BLUE SHIELD | Attending: Emergency Medicine | Admitting: Emergency Medicine

## 2016-01-12 DIAGNOSIS — Z8673 Personal history of transient ischemic attack (TIA), and cerebral infarction without residual deficits: Secondary | ICD-10-CM | POA: Diagnosis not present

## 2016-01-12 DIAGNOSIS — I10 Essential (primary) hypertension: Secondary | ICD-10-CM | POA: Insufficient documentation

## 2016-01-12 DIAGNOSIS — Z760 Encounter for issue of repeat prescription: Secondary | ICD-10-CM | POA: Diagnosis present

## 2016-01-12 DIAGNOSIS — F17210 Nicotine dependence, cigarettes, uncomplicated: Secondary | ICD-10-CM | POA: Diagnosis not present

## 2016-01-12 DIAGNOSIS — Z79899 Other long term (current) drug therapy: Secondary | ICD-10-CM | POA: Diagnosis not present

## 2016-01-12 MED ORDER — VERAPAMIL HCL ER 120 MG PO TBCR: 120.0000 mg | EXTENDED_RELEASE_TABLET | Freq: Every day | ORAL | Status: DC

## 2016-01-12 MED ORDER — TRIAMTERENE-HCTZ 50-25 MG PO CAPS: 1.0000 | ORAL_CAPSULE | ORAL | Status: DC

## 2016-01-12 NOTE — ED Notes (Signed)
Patient complaining she thought her blood pressure was high. Patient also needs a medication refill of blood pressure medicines because the doctors she was referred to can not see her until September.

## 2016-01-12 NOTE — ED Notes (Signed)
PT DISCHARGED. INSTRUCTIONS AND PRESCRIPTIONS GIVEN. AAOX4. PT IN NO APPARENT DISTRESS OR PAIN. THE OPPORTUNITY TO ASK QUESTIONS WAS PROVIDED. 

## 2016-01-12 NOTE — ED Provider Notes (Signed)
CSN: [id number]     Arrival date & time 01/12/16  2039 History  By signing my name below, I, Georgette Shell, attest that this documentation has been prepared under the direction and in the presence of Waynetta Pean, PA-C. Electronically Signed: Georgette Shell, ED Scribe. 01/12/2016. 9:53 PM.   Chief Complaint  Patient presents with  . Medication Refill    The history is provided by the patient. No language interpreter was used.    HPI Comments: Karen Ochoa is a 50 y.o. female who presents to the Emergency Department complaining of needing a medication refill to her blood pressure medications. Per pt, she has been out of her blood pressure medication for 3 days and requests a refill and a follow-up or referral for a PCP. Patient does not currently have a PCP because she just got insurance a month ago. She reports she is not able to follow-up with most current family practice until September. Patient last had her blood pressure medications refilled by urgent care. She reports taking hydrochlorothiazide only today. Patient denies fevers, vomiting, diarrhea, chest pain, shortness of breath, and headaches.    Past Medical History  Diagnosis Date  . Hypertension   . Fibroids   . Hx MRSA infection   . Insomnia due to medical condition   . Hx-TIA (transient ischemic attack)     permanent insomnia   Past Surgical History  Procedure Laterality Date  . Wisdom tooth extraction     Family History  Problem Relation Age of Onset  . Anesthesia problems Neg Hx   . Hypertension Mother   . Diabetes Sister   . Asthma Brother   . Diabetes Brother    Social History  Substance Use Topics  . Smoking status: Current Every Day Smoker -- 1.00 packs/day    Types: Cigarettes  . Smokeless tobacco: Never Used  . Alcohol Use: Yes     Comment: weekends   OB History    Gravida Para Term Preterm AB TAB SAB Ectopic Multiple Living   4 0 0 0 4 0 4 0 0 0      Review of Systems  Constitutional: Negative for  fever.  Respiratory: Negative for cough and shortness of breath.   Cardiovascular: Negative for chest pain and palpitations.  Gastrointestinal: Negative for nausea, vomiting and diarrhea.  Musculoskeletal: Negative for arthralgias.  Skin: Negative for rash.  Neurological: Negative for dizziness, syncope, weakness, light-headedness, numbness and headaches.      Allergies  Penicillins; Sulfonamide derivatives; and Flagyl  Home Medications   Prior to Admission medications   Medication Sig Start Date End Date Taking? Authorizing Provider  acetaminophen (TYLENOL) 325 MG tablet Take 2 tablets (650 mg total) by mouth every 6 (six) hours as needed for fever. 08/29/15   Olivia Canter Sam, PA-C  benzonatate (TESSALON) 100 MG capsule Take 1 capsule (100 mg total) by mouth every 8 (eight) hours. 08/27/15   Deno Etienne, DO  doxycycline (VIBRAMYCIN) 100 MG capsule Take 1 capsule (100 mg total) by mouth 2 (two) times daily. 08/29/15   Olivia Canter Sam, PA-C  ibuprofen (ADVIL,MOTRIN) 200 MG tablet Take 800 mg by mouth every 6 (six) hours as needed for headache or moderate pain.     Historical Provider, MD  Ibuprofen-Diphenhydramine Cit (ADVIL PM PO) Take 1 capsule by mouth at bedtime.    Historical Provider, MD  metoprolol (LOPRESSOR) 25 MG tablet Take 1 tablet (25 mg total) by mouth 2 (two) times daily. 12/17/15   Billy Fischer,  MD  METOPROLOL SUCCINATE ER PO Take 25 mg by mouth. Half 1/2 a tablet daily    Historical Provider, MD  traZODone (DESYREL) 100 MG tablet Take 100 mg by mouth at bedtime as needed for sleep.     Historical Provider, MD  triamterene-hydrochlorothiazide (DYAZIDE) 50-25 MG capsule Take 1 capsule by mouth every morning. 01/12/16   Waynetta Pean, PA-C  verapamil (CALAN-SR) 120 MG CR tablet Take 1 tablet (120 mg total) by mouth at bedtime. 01/12/16   Waynetta Pean, PA-C   BP 115/80 mmHg  Pulse 78  Temp(Src) 98.7 F (37.1 C) (Oral)  Resp 20  Ht 5\' 7"  (1.702 m)  Wt 74.844 kg  BMI 25.84 kg/m2   SpO2 100% Physical Exam  Constitutional: She is oriented to person, place, and time. She appears well-developed and well-nourished. No distress.  Nontoxic appearing.  HENT:  Head: Normocephalic and atraumatic.  Eyes: Right eye exhibits no discharge. Left eye exhibits no discharge.  Cardiovascular: Normal rate, regular rhythm, normal heart sounds and intact distal pulses.   Pulmonary/Chest: Effort normal and breath sounds normal. No respiratory distress. She has no wheezes. She has no rales.  Abdominal: Soft. There is no tenderness.  Neurological: She is alert and oriented to person, place, and time. Coordination normal.  Skin: Skin is warm and dry. No rash noted. She is not diaphoretic. No erythema. No pallor.  Psychiatric: She has a normal mood and affect. Her behavior is normal.  Nursing note and vitals reviewed.   ED Course  Procedures (including critical care time) DIAGNOSTIC STUDIES: Oxygen Saturation is 100% on RA, normal by my interpretation.    COORDINATION OF CARE: 9:48 PM Discussed treatment plan with pt at bedside which includes PCP referral and Rx for blood pressure medication and pt agreed to plan.    MDM   Meds given in ED:  Medications - No data to display  New Prescriptions   No medications on file    Final diagnoses:  Essential hypertension   This is a 50 y.o. female who presents to the Emergency Department complaining of needing a medication refill to her blood pressure medications. Per pt, she has been out of her blood pressure medication for 3 days and requests a refill and a follow-up or referral for a PCP. Patient does not currently have a PCP because she just got insurance a month ago. She reports she is not able to follow-up with most current family practice until September.  On exam the patient is afebrile nontoxic appearing. Her blood pressure is 115/80. She denies any complaints. No headache.  We will provide with a month's refill for Dyazide and  verapamil. Patient was started on metoprolol by urgent care. She is not tachycardic currently. Her blood pressure is good and she has not had metoprolol and 4-5 days. As there is possibly for interaction with verapamil and Metroprolol will hold metoprolol and have this restarted by primary care if deemed necessary. I advised the patient cannot continue to receive refills in the emergency department because she needs to have routine blood work and blood pressure checks done by primary care. I encouraged her to call the number on her insurance card to obtain sooner follow-up with her primary care provider is accepting new patients currently. Patient agrees to this plan. I advised the patient to follow-up with their primary care provider this week. I advised the patient to return to the emergency department with new or worsening symptoms or new concerns. The patient verbalized understanding  and agreement with plan.    I personally performed the services described in this documentation, which was scribed in my presence. The recorded information has been reviewed and is accurate.       Waynetta Pean, PA-C 01/12/16 [place], PA-C 01/12/16 2206  Leo Grosser, MD 01/13/16 [id number]

## 2016-01-12 NOTE — Discharge Instructions (Signed)
Medicine Refill at the Emergency Department We have refilled your medicine today, but it is best for you to get refills through your primary health care provider's office. In the future, please plan ahead so you do not need to get refills from the emergency department. If the medicine we refilled was a maintenance medicine, you may have received only enough to get you by until you are able to see your regular health care provider.   This information is not intended to replace advice given to you by your health care provider. Make sure you discuss any questions you have with your health care provider.   Document Released: 10/23/2003 Document Revised: 07/27/2014 Document Reviewed: 10/13/2013 Elsevier Interactive Patient Education 2016 [employer]. Hypertension Hypertension, commonly called high blood pressure, is when the force of blood pumping through your arteries is too strong. Your arteries are the blood vessels that carry blood from your heart throughout your body. A blood pressure reading consists of a higher number over a lower number, such as 110/72. The higher number (systolic) is the pressure inside your arteries when your heart pumps. The lower number (diastolic) is the pressure inside your arteries when your heart relaxes. Ideally you want your blood pressure below 120/80. Hypertension forces your heart to work harder to pump blood. Your arteries may become narrow or stiff. Having untreated or uncontrolled hypertension can cause heart attack, stroke, kidney disease, and other problems. RISK FACTORS Some risk factors for high blood pressure are controllable. Others are not.  Risk factors you cannot control include:   Race. You may be at higher risk if you are African American.  Age. Risk increases with age.  Gender. Men are at higher risk than women before age 28 years. After age 7, women are at higher risk than men. Risk factors you can control include:  Not getting enough exercise  or physical activity.  Being overweight.  Getting too much fat, sugar, calories, or salt in your diet.  Drinking too much alcohol. SIGNS AND SYMPTOMS Hypertension does not usually cause signs or symptoms. Extremely high blood pressure (hypertensive crisis) may cause headache, anxiety, shortness of breath, and nosebleed. DIAGNOSIS To check if you have hypertension, your health care provider will measure your blood pressure while you are seated, with your arm held at the level of your heart. It should be measured at least twice using the same arm. Certain conditions can cause a difference in blood pressure between your right and left arms. A blood pressure reading that is higher than normal on one occasion does not mean that you need treatment. If it is not clear whether you have high blood pressure, you may be asked to return on a different day to have your blood pressure checked again. Or, you may be asked to monitor your blood pressure at home for 1 or more weeks. TREATMENT Treating high blood pressure includes making lifestyle changes and possibly taking medicine. Living a healthy lifestyle can help lower high blood pressure. You may need to change some of your habits. Lifestyle changes may include:  Following the DASH diet. This diet is high in fruits, vegetables, and whole grains. It is low in salt, red meat, and added sugars.  Keep your sodium intake below 2,300 mg per day.  Getting at least 30-45 minutes of aerobic exercise at least 4 times per week.  Losing weight if necessary.  Not smoking.  Limiting alcoholic beverages.  Learning ways to reduce stress. Your health care provider may prescribe medicine  if lifestyle changes are not enough to get your blood pressure under control, and if one of the following is true:  You are 8-40 years of age and your systolic blood pressure is above 140.  You are 44 years of age or older, and your systolic blood pressure is above 150.  Your  diastolic blood pressure is above 90.  You have diabetes, and your systolic blood pressure is over [id number] or your diastolic blood pressure is over 90.  You have kidney disease and your blood pressure is above 140/90.  You have heart disease and your blood pressure is above 140/90. Your personal target blood pressure may vary depending on your medical conditions, your age, and other factors. HOME CARE INSTRUCTIONS  Have your blood pressure rechecked as directed by your health care provider.   Take medicines only as directed by your health care provider. Follow the directions carefully. Blood pressure medicines must be taken as prescribed. The medicine does not work as well when you skip doses. Skipping doses also puts you at risk for problems.  Do not smoke.   Monitor your blood pressure at home as directed by your health care provider. SEEK MEDICAL CARE IF:   You think you are having a reaction to medicines taken.  You have recurrent headaches or feel dizzy.  You have swelling in your ankles.  You have trouble with your vision. SEEK IMMEDIATE MEDICAL CARE IF:  You develop a severe headache or confusion.  You have unusual weakness, numbness, or feel faint.  You have severe chest or abdominal pain.  You vomit repeatedly.  You have trouble breathing. MAKE SURE YOU:   Understand these instructions.  Will watch your condition.  Will get help right away if you are not doing well or get worse.   This information is not intended to replace advice given to you by your health care provider. Make sure you discuss any questions you have with your health care provider.   Document Released: 07/06/2005 Document Revised: 11/20/2014 Document Reviewed: 04/28/2013 Elsevier Interactive Patient Education [employer].

## 2016-02-13 ENCOUNTER — Emergency Department (HOSPITAL_COMMUNITY)
Admission: EM | Admit: 2016-02-13 | Discharge: 2016-02-13 | Disposition: A | Discharge status: Home / Self Care | Payer: BLUE CROSS/BLUE SHIELD | Attending: Emergency Medicine | Admitting: Emergency Medicine

## 2016-02-13 ENCOUNTER — Encounter (HOSPITAL_COMMUNITY): Payer: Self-pay | Admitting: Emergency Medicine

## 2016-02-13 ENCOUNTER — Emergency Department (HOSPITAL_COMMUNITY): Payer: BLUE CROSS/BLUE SHIELD

## 2016-02-13 DIAGNOSIS — R51 Headache: Secondary | ICD-10-CM | POA: Diagnosis not present

## 2016-02-13 DIAGNOSIS — Z8673 Personal history of transient ischemic attack (TIA), and cerebral infarction without residual deficits: Secondary | ICD-10-CM | POA: Diagnosis not present

## 2016-02-13 DIAGNOSIS — H9203 Otalgia, bilateral: Secondary | ICD-10-CM | POA: Diagnosis present

## 2016-02-13 DIAGNOSIS — F17210 Nicotine dependence, cigarettes, uncomplicated: Secondary | ICD-10-CM | POA: Insufficient documentation

## 2016-02-13 DIAGNOSIS — E876 Hypokalemia: Secondary | ICD-10-CM | POA: Diagnosis not present

## 2016-02-13 DIAGNOSIS — Z79899 Other long term (current) drug therapy: Secondary | ICD-10-CM | POA: Insufficient documentation

## 2016-02-13 DIAGNOSIS — I160 Hypertensive urgency: Secondary | ICD-10-CM | POA: Diagnosis not present

## 2016-02-13 DIAGNOSIS — R519 Headache, unspecified: Secondary | ICD-10-CM

## 2016-02-13 LAB — CBC WITH DIFFERENTIAL/PLATELET
BASOS ABS: 0 10*3/uL (ref 0.0–0.1)
Basophils Relative: 1 %
EOS PCT: 3 %
Eosinophils Absolute: 0.2 10*3/uL (ref 0.0–0.7)
HCT: 40.6 % (ref 36.0–46.0)
Hemoglobin: 13.9 g/dL (ref 12.0–15.0)
LYMPHS PCT: 41 %
Lymphs Abs: 2.8 10*3/uL (ref 0.7–4.0)
MCH: 30.8 pg (ref 26.0–34.0)
MCHC: 34.2 g/dL (ref 30.0–36.0)
MCV: 90 fL (ref 78.0–100.0)
MONO ABS: 0.4 10*3/uL (ref 0.1–1.0)
Monocytes Relative: 6 %
Neutro Abs: 3.4 10*3/uL (ref 1.7–7.7)
Neutrophils Relative %: 49 %
Platelets: 275 10*3/uL (ref 150–400)
RBC: 4.51 MIL/uL (ref 3.87–5.11)
RDW: 14.1 % (ref 11.5–15.5)
WBC: 6.8 10*3/uL (ref 4.0–10.5)

## 2016-02-13 LAB — COMPREHENSIVE METABOLIC PANEL
ALT: 17 U/L (ref 14–54)
AST: 24 U/L (ref 15–41)
Albumin: 4.3 g/dL (ref 3.5–5.0)
Alkaline Phosphatase: 128 U/L — ABNORMAL HIGH (ref 38–126)
Anion gap: 11 (ref 5–15)
BUN: 22 mg/dL — ABNORMAL HIGH (ref 6–20)
CHLORIDE: 102 mmol/L (ref 101–111)
CO2: 26 mmol/L (ref 22–32)
Calcium: 9.1 mg/dL (ref 8.9–10.3)
Creatinine, Ser: 1.26 mg/dL — ABNORMAL HIGH (ref 0.44–1.00)
GFR, EST AFRICAN AMERICAN: 56 mL/min — AB (ref >60)
GFR, EST NON AFRICAN AMERICAN: 48 mL/min — AB (ref >60)
Glucose, Bld: 99 mg/dL (ref 65–99)
POTASSIUM: 3.1 mmol/L — AB (ref 3.5–5.1)
Sodium: 139 mmol/L (ref 135–145)
Total Bilirubin: 0.6 mg/dL (ref 0.3–1.2)
Total Protein: 7.5 g/dL (ref 6.5–8.1)

## 2016-02-13 LAB — I-STAT TROPONIN, ED: TROPONIN I, POC: 0.01 ng/mL (ref 0.00–0.08)

## 2016-02-13 MED ORDER — POTASSIUM CHLORIDE CRYS ER 20 MEQ PO TBCR
40.0000 meq | EXTENDED_RELEASE_TABLET | Freq: Once | ORAL | Status: AC
Start: 2016-02-13 — End: 2016-02-13
  Administered 2016-02-13: 40 meq via ORAL

## 2016-02-13 MED ORDER — BUTALBITAL-APAP-CAFFEINE 50-325-40 MG PO TABS: 1.0000 | ORAL_TABLET | Freq: Four times a day (QID) | ORAL | 0 refills | Status: DC | PRN

## 2016-02-13 MED ORDER — DIPHENHYDRAMINE HCL 50 MG/ML IJ SOLN
25.0000 mg | Freq: Once | INTRAMUSCULAR | Status: AC
  Administered 2016-02-13: 25 mg via INTRAVENOUS
  Filled 2016-02-13: qty 1

## 2016-02-13 MED ORDER — HYDRALAZINE HCL 10 MG PO TABS
10.0000 mg | ORAL_TABLET | Freq: Once | ORAL | Status: AC
  Administered 2016-02-13: 10 mg via ORAL
  Filled 2016-02-13: qty 1

## 2016-02-13 MED ORDER — METOCLOPRAMIDE HCL 5 MG/ML IJ SOLN
10.0000 mg | Freq: Once | INTRAMUSCULAR | Status: AC
  Administered 2016-02-13: 10 mg via INTRAVENOUS
  Filled 2016-02-13: qty 2

## 2016-02-13 NOTE — ED Notes (Signed)
[place] notified of pt's manual BP 190/130.

## 2016-02-13 NOTE — ED Provider Notes (Signed)
[place] [facility] DEPT Provider Note   CSN: [id number] Arrival date & time: 02/13/16  0530  First Provider Contact:  None       History   Chief Complaint Chief Complaint  Patient presents with  . Otalgia  . Headache    HPI Karen Ochoa is a 50 y.o. female.  HPI   50 year old female with history of prior TIA, hypertension, insomnia presenting today with complaints of headache and ear pain. Patient reports gradual onset of right-sided headache ongoing for the past 2 days. Headache is currently rated as a 9 out of 10 sharp pain that is waxing waning nothing seems to make it better or worse. She endorse nausea without vomiting, blurred vision, and pain radiates towards her right ear. She took 600 mg of ibuprofen yesterday with minimal relief. She denies associated fever, chills, URI symptoms, hearing changes, ringing in ear, diplopia, light and sound sensitivity neck pain or neck stiffness, chest pain, shortness of breath, abdominal pain, focal numbness or weakness, or rash. She reported occasional headache but not this intense. She admits to history of high blood pressure, and have been compliant with her medication. She did not take her medication yesterday or today. She is in the process of finding a primary care provider and have an appointment on August 16.  Past Medical History:  Diagnosis Date  . Fibroids   . Hx MRSA infection   . Hx-TIA (transient ischemic attack)    permanent insomnia  . Hypertension   . Insomnia due to medical condition     Patient Active Problem List   Diagnosis Date Noted  . SMOKER 06/18/2009  . SYNCOPE 06/18/2009  . INSOMNIA, CHRONIC 05/27/2009  . HYPERTENSION 05/27/2009  . STROKE 05/27/2009  . HEADACHE, CHRONIC, HX OF 05/27/2009    Past Surgical History:  Procedure Laterality Date  . WISDOM TOOTH EXTRACTION      OB History    Gravida Para Term Preterm AB Living   4 0 0 0 4 0   SAB TAB Ectopic Multiple Live Births   4 0 0 0          Home Medications    Prior to Admission medications   Medication Sig Start Date End Date Taking? Authorizing Provider  acetaminophen (TYLENOL) 325 MG tablet Take 2 tablets (650 mg total) by mouth every 6 (six) hours as needed for fever. 08/29/15   Olivia Canter Sam, PA-C  benzonatate (TESSALON) 100 MG capsule Take 1 capsule (100 mg total) by mouth every 8 (eight) hours. 08/27/15   Deno Etienne, DO  doxycycline (VIBRAMYCIN) 100 MG capsule Take 1 capsule (100 mg total) by mouth 2 (two) times daily. 08/29/15   Olivia Canter Sam, PA-C  ibuprofen (ADVIL,MOTRIN) 200 MG tablet Take 800 mg by mouth every 6 (six) hours as needed for headache or moderate pain.     Historical Provider, MD  Ibuprofen-Diphenhydramine Cit (ADVIL PM PO) Take 1 capsule by mouth at bedtime.    Historical Provider, MD  metoprolol (LOPRESSOR) 25 MG tablet Take 1 tablet (25 mg total) by mouth 2 (two) times daily. 12/17/15   Billy Fischer, MD  METOPROLOL SUCCINATE ER PO Take 25 mg by mouth. Half 1/2 a tablet daily    Historical Provider, MD  traZODone (DESYREL) 100 MG tablet Take 100 mg by mouth at bedtime as needed for sleep.     Historical Provider, MD  triamterene-hydrochlorothiazide (DYAZIDE) 50-25 MG capsule Take 1 capsule by mouth every morning. 01/12/16   Waynetta Pean,  PA-C  verapamil (CALAN-SR) 120 MG CR tablet Take 1 tablet (120 mg total) by mouth at bedtime. 01/12/16   Waynetta Pean, PA-C    Family History Family History  Problem Relation Age of Onset  . Anesthesia problems Neg Hx   . Hypertension Mother   . Diabetes Sister   . Asthma Brother   . Diabetes Brother     Social History Social History  Substance Use Topics  . Smoking status: Current Every Day Smoker    Packs/day: 1.00    Types: Cigarettes  . Smokeless tobacco: Never Used  . Alcohol use Yes     Comment: weekends     Allergies   Penicillins; Sulfonamide derivatives; and Flagyl [metronidazole]   Review of Systems Review of Systems  All other systems  reviewed and are negative.    Physical Exam Updated Vital Signs BP (!) 190/120 (BP Location: Right Arm)   Pulse 113   Temp 97.7 F (36.5 C) (Oral)   Resp 21   Ht 5\' 7"  (1.702 m)   Wt 74.8 kg   SpO2 99%   BMI 25.84 kg/m   Physical Exam  Constitutional: She is oriented to person, place, and time. She appears well-developed and well-nourished. No distress.  African-American female laying in bed in no acute discomfort, nontoxic  HENT:  Head: Normocephalic and atraumatic.  Right Ear: External ear normal.  Left Ear: External ear normal.  Nose: Nose normal.  Mouth/Throat: Oropharynx is clear and moist.  Eyes: Conjunctivae and EOM are normal. Pupils are equal, round, and reactive to light.  Neck: Normal range of motion. Neck supple.  No carotid bruit and no neck tenderness on exam.  Cardiovascular: Normal rate and regular rhythm.   Pulmonary/Chest: Effort normal and breath sounds normal.  Abdominal: Soft.  Musculoskeletal: Normal range of motion.  Neurological: She is alert and oriented to person, place, and time.  Neurologic exam:  Speech clear, pupils equal round reactive to light, extraocular movements intact  Normal peripheral visual fields Cranial nerves III through XII normal including no facial droop Follows commands, moves all extremities x4, normal strength to bilateral upper and lower extremities at all major muscle groups including grip Sensation normal to light touch and pinprick Coordination intact, no limb ataxia, finger-nose-finger normal Rapid alternating movements normal No pronator drift Gait normal   Skin: No rash noted.  Psychiatric: She has a normal mood and affect.  Nursing note and vitals reviewed.    ED Treatments / Results  Labs (all labs ordered are listed, but only abnormal results are displayed) Labs Reviewed  COMPREHENSIVE METABOLIC PANEL - Abnormal; Notable for the following:       Result Value   Potassium 3.1 (*)    BUN 22 (*)     Creatinine, Ser 1.26 (*)    Alkaline Phosphatase 128 (*)    GFR calc non Af Amer 48 (*)    GFR calc Af Amer 56 (*)    All other components within normal limits  CBC WITH DIFFERENTIAL/PLATELET  Randolm Idol, ED    EKG  EKG Interpretation  Date/Time:  Thursday February 13 2016 05:52:05 EDT Ventricular Rate:  100 PR Interval:    QRS Duration: 81 QT Interval:  387 QTC Calculation: 497 R Axis:   20 Text Interpretation:  Age not entered, assumed to be  50 years old for purpose of ECG interpretation Sinus tachycardia Probable left atrial enlargement RSR' in V1 or V2, probably normal variant Borderline prolonged QT interval When compared with  ECG of 09/13/2012, No significant change was found Confirmed by [facility]  MD, DAVID ([id number]) on 02/13/2016 6:30:35 AM       Radiology Ct Head Wo Contrast  Result Date: 02/13/2016 CLINICAL DATA:  Right-sided headache. Several days duration. Hypertension. EXAM: CT HEAD WITHOUT CONTRAST TECHNIQUE: Contiguous axial images were obtained from the base of the skull through the vertex without intravenous contrast. COMPARISON:  MRI 04/23/2006.  CT 04/22/2006. FINDINGS: No visible change since the previous studies. Extensive chronic small vessel disease throughout the white matter again demonstrated. 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 No evidence of acute infarction, mass lesion, hemorrhage, hydrocephalus or extra-axial collection. No skull fracture. Hypoplastic frontal sinuses with a small amount of fluid. There is atherosclerotic calcification of the major vessels at the base of the brain. IMPRESSION: No acute finding. Extensive chronic small vessel ischemic changes throughout the hemispheric white matter as demonstrated previously. Electronically Signed   By: Nelson Chimes M.D.   On: 02/13/2016 07:11   Procedures Procedures (including critical care time)  Medications Ordered in ED Medications  metoCLOPramide (REGLAN) injection 10 mg (10 mg Intravenous Given  02/13/16 0637)  diphenhydrAMINE (BENADRYL) injection 25 mg (25 mg Intravenous Given 02/13/16 0639)  hydrALAZINE (APRESOLINE) tablet 10 mg (10 mg Oral Given 02/13/16 [id number])     Initial Impression / Assessment and Plan / ED Course  I have reviewed the triage vital signs and the nursing notes.  Pertinent labs & imaging results that were available during my care of the patient were reviewed by me and considered in my medical decision making (see chart for details).  Clinical Course    BP 175/99   Pulse 102   Temp 97.7 F (36.5 C) (Oral)   Resp 15   Ht 5\' 7"  (1.702 m)   Wt 74.8 kg   SpO2 100%   BMI 25.84 kg/m    Final Clinical Impressions(s) / ED Diagnoses   Final diagnoses:  Bad headache  Hypertensive urgency  Hypokalemia    New Prescriptions New Prescriptions   BUTALBITAL-ACETAMINOPHEN-CAFFEINE (FIORICET) 50-325-40 MG TABLET    Take 1-2 tablets by mouth every 6 (six) hours as needed for headache.    6:22 AM Patient here with gradual onset of headache. She has no focal neuro deficit on exam concerning for acute stroke. No fever or nuchal rigidity concerning for meningitis. Headache is less likely to be subarachnoid hemorrhage as it is a gradual onset and has persisted for the past 6 days. She was found to be hypertensive with a blood pressure of greater than [id number] systolic. She has had elevated blood pressure from prior visits, currently on 2 different blood pressure medications. Plan to obtain labs, head CT to evaluate for hypertensive urgency. Migraine cocktail given.  8:55 AM Blood pressure stabilize after receiving a migraine cocktail as well as 10 mg of hydralazine. Headache improves but has not fully resolved. I offer additional pain medication but patient prefers to go home instead. Her EKG today shows no acute ischemic changes, labs are reassuring, mild hypokalemia with potassium 3.1, supplementation will be prescribed. Head CT scan without acute changes. Encouraged patient  to follow-up with PCP for further care. Return precaution discussed.   Domenic Moras, PA-C [id number]    Delora Fuel, MD [id number]

## 2016-02-13 NOTE — Discharge Instructions (Signed)
Take Fioricet as needed for headache.  Take your blood pressure regularly and monitor your blood pressure closely.  Follow up with your doctor for further care. Return if you have any concerns.

## 2016-02-13 NOTE — ED Triage Notes (Signed)
Patient complains of earache and headache. Patient states she has pressure in both ears. Patient states this has been going on for two days.

## 2016-03-10 ENCOUNTER — Encounter (HOSPITAL_COMMUNITY): Payer: Self-pay | Admitting: Emergency Medicine

## 2016-03-10 ENCOUNTER — Ambulatory Visit (HOSPITAL_COMMUNITY)
Admission: EM | Admit: 2016-03-10 | Discharge: 2016-03-10 | Disposition: A | Discharge status: Home / Self Care | Payer: BLUE CROSS/BLUE SHIELD | Attending: Family Medicine | Admitting: Family Medicine

## 2016-03-10 DIAGNOSIS — I10 Essential (primary) hypertension: Secondary | ICD-10-CM | POA: Diagnosis not present

## 2016-03-10 MED ORDER — TRIAMTERENE-HCTZ 50-25 MG PO CAPS: 1.0000 | ORAL_CAPSULE | ORAL | 0 refills | Status: DC

## 2016-03-10 MED ORDER — METOPROLOL TARTRATE 25 MG PO TABS: 25.0000 mg | ORAL_TABLET | Freq: Two times a day (BID) | ORAL | 0 refills | Status: DC

## 2016-03-10 MED ORDER — VERAPAMIL HCL ER 120 MG PO TBCR: 120.0000 mg | EXTENDED_RELEASE_TABLET | Freq: Every day | ORAL | 0 refills | Status: DC

## 2016-03-10 NOTE — ED Provider Notes (Signed)
CSN: [id number]     Arrival date & time 03/10/16  1619 History   First MD Initiated Contact with Patient 03/10/16 1703     No chief complaint on file.  (Consider location/radiation/quality/duration/timing/severity/associated sxs/prior Treatment) Patient has run out of her bp meds and needs a refill.  She has to wait until October before she can get into see a pcp.  She is asymptomatic.   The history is provided by the patient.  Medication Refill  Medications/supplies requested:  Lopressor, dyazide, verapamil Reason for request:  Clinic/provider not available Medications taken before: yes - see home medications   Patient has complete original prescription information: yes     Past Medical History:  Diagnosis Date  . Fibroids   . Hx MRSA infection   . Hx-TIA (transient ischemic attack)    permanent insomnia  . Hypertension   . Insomnia due to medical condition    Past Surgical History:  Procedure Laterality Date  . WISDOM TOOTH EXTRACTION     Family History  Problem Relation Age of Onset  . Anesthesia problems Neg Hx   . Hypertension Mother   . Diabetes Sister   . Asthma Brother   . Diabetes Brother    Social History  Substance Use Topics  . Smoking status: Current Every Day Smoker    Packs/day: 1.00    Types: Cigarettes  . Smokeless tobacco: Never Used  . Alcohol use Yes     Comment: weekends   OB History    Gravida Para Term Preterm AB Living   4 0 0 0 4 0   SAB TAB Ectopic Multiple Live Births   4 0 0 0       Review of Systems  Constitutional: Negative.   HENT: Negative.   Eyes: Negative.   Respiratory: Negative.   Cardiovascular: Negative.   Endocrine: Negative.   Genitourinary: Negative.   Musculoskeletal: Negative.   Skin: Negative.   Allergic/Immunologic: Negative.   Neurological: Negative.   Hematological: Negative.   Psychiatric/Behavioral: Negative.   All other systems reviewed and are negative.   Allergies  Penicillins; Sulfonamide  derivatives; and Flagyl [metronidazole]  Home Medications   Prior to Admission medications   Medication Sig Start Date End Date Taking? Authorizing Provider  acetaminophen (TYLENOL) 325 MG tablet Take 2 tablets (650 mg total) by mouth every 6 (six) hours as needed for fever. 08/29/15   Olivia Canter Sam, PA-C  benzonatate (TESSALON) 100 MG capsule Take 1 capsule (100 mg total) by mouth every 8 (eight) hours. Patient not taking: Reported on 02/13/2016 08/27/15   Deno Etienne, DO  butalbital-acetaminophen-caffeine (FIORICET) [phone] MG tablet Take 1-2 tablets by mouth every 6 (six) hours as needed for headache. 02/13/16 02/12/17  Domenic Moras, PA-C  doxycycline (VIBRAMYCIN) 100 MG capsule Take 1 capsule (100 mg total) by mouth 2 (two) times daily. Patient not taking: Reported on 02/13/2016 08/29/15   Olivia Canter Sam, PA-C  ibuprofen (ADVIL,MOTRIN) 200 MG tablet Take 800 mg by mouth every 6 (six) hours as needed for headache or moderate pain.     Historical Provider, MD  Ibuprofen-Diphenhydramine Cit (ADVIL PM PO) Take 1 capsule by mouth at bedtime.    Historical Provider, MD  metoprolol (LOPRESSOR) 25 MG tablet Take 1 tablet (25 mg total) by mouth 2 (two) times daily. Patient not taking: Reported on 02/13/2016 12/17/15   Billy Fischer, MD  traZODone (DESYREL) 100 MG tablet Take 100 mg by mouth at bedtime as needed for sleep.  Historical Provider, MD  triamterene-hydrochlorothiazide (DYAZIDE) 37.5-25 MG capsule Take 1 capsule by mouth daily. 01/29/16   Historical Provider, MD  triamterene-hydrochlorothiazide (DYAZIDE) 50-25 MG capsule Take 1 capsule by mouth every morning. Patient not taking: Reported on 02/13/2016 01/12/16   Waynetta Pean, PA-C  verapamil (CALAN-SR) 120 MG CR tablet Take 1 tablet (120 mg total) by mouth at bedtime. 01/12/16   Waynetta Pean, PA-C   Meds Ordered and Administered this Visit  Medications - No data to display  BP (!) 206/128 (BP Location: Right Arm) Comment: both BPs reported to CMA  Brent  Pulse 95   Temp 98 F (36.7 C) (Oral)   Resp 18   Ht 5\' 7"  (1.702 m)   Wt 165 lb (74.8 kg)   SpO2 100%   BMI 25.84 kg/m  No data found.   Physical Exam  Constitutional: She is oriented to person, place, and time. She appears well-developed and well-nourished.  HENT:  Head: Normocephalic and atraumatic.  Right Ear: External ear normal.  Left Ear: External ear normal.  Mouth/Throat: Oropharynx is clear and moist.  Eyes: Conjunctivae and EOM are normal. Pupils are equal, round, and reactive to light.  Neck: Normal range of motion. Neck supple.  Cardiovascular: Normal rate, regular rhythm and normal heart sounds.   Pulmonary/Chest: Effort normal and breath sounds normal.  Abdominal: Soft. Bowel sounds are normal.  Neurological: She is alert and oriented to person, place, and time.  Nursing note and vitals reviewed.   Urgent Care Course   Clinical Course    Procedures (including critical care time)  Labs Review Labs Reviewed - No data to display  Imaging Review No results found.   Visual Acuity Review  Right Eye Distance:   Left Eye Distance:   Bilateral Distance:    Right Eye Near:   Left Eye Near:    Bilateral Near:         MDM  HYpertension - Verapamil 120mg  one po qd #30 Lopressor 25mg  one po bid #60 Dyazide 50/25 one po qd #30  Follow up with pcp   Lysbeth Penner, FNP 03/10/16 1725

## 2016-03-10 NOTE — ED Triage Notes (Signed)
The patient presented to the [facility] with a complaint of needing a refill on her HTN medications. The patient stated that she last took them 4 days ago. The patient did not have the medications with her and was not able to provide the names other than to say there were 3 medications.

## 2016-04-01 ENCOUNTER — Encounter (HOSPITAL_COMMUNITY): Payer: Self-pay

## 2016-04-01 ENCOUNTER — Emergency Department (HOSPITAL_COMMUNITY)
Admission: EM | Admit: 2016-04-01 | Discharge: 2016-04-01 | Disposition: A | Discharge status: Home / Self Care | Payer: BLUE CROSS/BLUE SHIELD | Attending: Emergency Medicine | Admitting: Emergency Medicine

## 2016-04-01 DIAGNOSIS — L299 Pruritus, unspecified: Secondary | ICD-10-CM | POA: Diagnosis not present

## 2016-04-01 DIAGNOSIS — Z79899 Other long term (current) drug therapy: Secondary | ICD-10-CM | POA: Diagnosis not present

## 2016-04-01 DIAGNOSIS — I10 Essential (primary) hypertension: Secondary | ICD-10-CM | POA: Diagnosis not present

## 2016-04-01 DIAGNOSIS — F17210 Nicotine dependence, cigarettes, uncomplicated: Secondary | ICD-10-CM | POA: Diagnosis not present

## 2016-04-01 DIAGNOSIS — Z791 Long term (current) use of non-steroidal anti-inflammatories (NSAID): Secondary | ICD-10-CM | POA: Diagnosis not present

## 2016-04-01 LAB — CBC WITH DIFFERENTIAL/PLATELET
BASOS ABS: 0 10*3/uL (ref 0.0–0.1)
BASOS PCT: 1 %
EOS ABS: 0.1 10*3/uL (ref 0.0–0.7)
Eosinophils Relative: 2 %
HEMATOCRIT: 43.3 % (ref 36.0–46.0)
HEMOGLOBIN: 14.5 g/dL (ref 12.0–15.0)
Lymphocytes Relative: 33 %
Lymphs Abs: 2.4 10*3/uL (ref 0.7–4.0)
MCH: 31.2 pg (ref 26.0–34.0)
MCHC: 33.5 g/dL (ref 30.0–36.0)
MCV: 93.1 fL (ref 78.0–100.0)
MONOS PCT: 7 %
Monocytes Absolute: 0.5 10*3/uL (ref 0.1–1.0)
NEUTROS ABS: 4.2 10*3/uL (ref 1.7–7.7)
NEUTROS PCT: 57 %
Platelets: 267 10*3/uL (ref 150–400)
RBC: 4.65 MIL/uL (ref 3.87–5.11)
RDW: 15.1 % (ref 11.5–15.5)
WBC: 7.3 10*3/uL (ref 4.0–10.5)

## 2016-04-01 LAB — COMPREHENSIVE METABOLIC PANEL
ALK PHOS: 115 U/L (ref 38–126)
ALT: 15 U/L (ref 14–54)
ANION GAP: 8 (ref 5–15)
AST: 24 U/L (ref 15–41)
Albumin: 4.6 g/dL (ref 3.5–5.0)
BUN: 19 mg/dL (ref 6–20)
CALCIUM: 9.2 mg/dL (ref 8.9–10.3)
CHLORIDE: 103 mmol/L (ref 101–111)
CO2: 27 mmol/L (ref 22–32)
CREATININE: 1.08 mg/dL — AB (ref 0.44–1.00)
GFR, EST NON AFRICAN AMERICAN: 58 mL/min — AB (ref >60)
Glucose, Bld: 95 mg/dL (ref 65–99)
Potassium: 4.4 mmol/L (ref 3.5–5.1)
SODIUM: 138 mmol/L (ref 135–145)
Total Bilirubin: 0.7 mg/dL (ref 0.3–1.2)
Total Protein: 7.8 g/dL (ref 6.5–8.1)

## 2016-04-01 MED ORDER — DIPHENHYDRAMINE HCL 25 MG PO CAPS
50.0000 mg | ORAL_CAPSULE | Freq: Once | ORAL | Status: AC
  Administered 2016-04-01: 50 mg via ORAL
  Filled 2016-04-01: qty 2

## 2016-04-01 MED ORDER — HYDROCHLOROTHIAZIDE 25 MG PO TABS: 25.0000 mg | ORAL_TABLET | Freq: Every day | ORAL | 0 refills | Status: DC

## 2016-04-01 MED ORDER — VERAPAMIL HCL 120 MG PO TABS: 120.0000 mg | ORAL_TABLET | Freq: Every day | ORAL | 0 refills | Status: DC

## 2016-04-01 MED ORDER — HYDROCHLOROTHIAZIDE 12.5 MG PO CAPS
25.0000 mg | ORAL_CAPSULE | Freq: Once | ORAL | Status: AC
  Administered 2016-04-01: 25 mg via ORAL
  Filled 2016-04-01: qty 2

## 2016-04-01 MED ORDER — HYDROXYZINE HCL 25 MG PO TABS: 25.0000 mg | ORAL_TABLET | Freq: Four times a day (QID) | ORAL | 0 refills | Status: DC | PRN

## 2016-04-01 NOTE — Discharge Instructions (Signed)
Your itching may be due to you blood pressure medication.  Continue taking Metoprolol.  Stop taking your long acting verapamil and start taking verapamil and hydrochlorothiazide medication prescribed today.  Take Vistaril as needed for itchiness.  Follow up with your doctor closely for further care.  Return if you have any concerns.

## 2016-04-01 NOTE — ED Triage Notes (Signed)
She states that ever since she began taking Metoprolol and Verapamil for her b/p she has had generalized puritis which persists.  She is in no distress.

## 2016-04-01 NOTE — ED Notes (Signed)
PA made aware of BP 

## 2016-04-01 NOTE — ED Provider Notes (Signed)
[facility] DEPT Provider Note   CSN: [id number] Arrival date & time: 04/01/16  1422     History   Chief Complaint Chief Complaint  Patient presents with  . Pruritis    HPI Karen Ochoa is a 50 y.o. female.  HPI   50 year old female with history of hypertension presenting with complaints of pruritus. Patient states she is currently on metoprolol and verapamil for blood pressure medication. She recently received a different formulary of the 2 BP medications on Aug 22nd at Urgent Care when she was seen for high blood pressure.  She has been on the new medication, patient reported diffuse itchiness throughout body primarily to both arms and her anterior chest. Itchiness is intermittent but appears that the day. At home she used Benadryl which provide some relief but at work she cannot use Benadryl which make the itching is worse. She tries to stay compliant with her blood pressure medication but she cannot tolerate the itchiness. She denies any other environmental changes, denies having fever, headache, lightheadedness, throat swelling, abdominal cramping, chest pain or shortness of breath. She denies seeing any rash unless she scratches the skin.   Past Medical History:  Diagnosis Date  . Fibroids   . Hx MRSA infection   . Hx-TIA (transient ischemic attack)    permanent insomnia  . Hypertension   . Insomnia due to medical condition     Patient Active Problem List   Diagnosis Date Noted  . SMOKER 06/18/2009  . SYNCOPE 06/18/2009  . INSOMNIA, CHRONIC 05/27/2009  . HYPERTENSION 05/27/2009  . STROKE 05/27/2009  . HEADACHE, CHRONIC, HX OF 05/27/2009    Past Surgical History:  Procedure Laterality Date  . WISDOM TOOTH EXTRACTION      OB History    Gravida Para Term Preterm AB Living   4 0 0 0 4 0   SAB TAB Ectopic Multiple Live Births   4 0 0 0         Home Medications    Prior to Admission medications   Medication Sig Start Date End Date Taking? Authorizing  Provider  acetaminophen (TYLENOL) 325 MG tablet Take 2 tablets (650 mg total) by mouth every 6 (six) hours as needed for fever. 08/29/15   Olivia Canter Sam, PA-C  benzonatate (TESSALON) 100 MG capsule Take 1 capsule (100 mg total) by mouth every 8 (eight) hours. Patient not taking: Reported on 02/13/2016 08/27/15   Deno Etienne, DO  butalbital-acetaminophen-caffeine (FIORICET) [id number] MG tablet Take 1-2 tablets by mouth every 6 (six) hours as needed for headache. 02/13/16 02/12/17  Domenic Moras, PA-C  doxycycline (VIBRAMYCIN) 100 MG capsule Take 1 capsule (100 mg total) by mouth 2 (two) times daily. Patient not taking: Reported on 02/13/2016 08/29/15   Olivia Canter Sam, PA-C  ibuprofen (ADVIL,MOTRIN) 200 MG tablet Take 800 mg by mouth every 6 (six) hours as needed for headache or moderate pain.     Historical Provider, MD  Ibuprofen-Diphenhydramine Cit (ADVIL PM PO) Take 1 capsule by mouth at bedtime.    Historical Provider, MD  metoprolol tartrate (LOPRESSOR) 25 MG tablet Take 1 tablet (25 mg total) by mouth 2 (two) times daily. 03/10/16   Lysbeth Penner, FNP  traZODone (DESYREL) 100 MG tablet Take 100 mg by mouth at bedtime as needed for sleep.     Historical Provider, MD  triamterene-hydrochlorothiazide (DYAZIDE) 37.5-25 MG capsule Take 1 capsule by mouth daily. 01/29/16   Historical Provider, MD  triamterene-hydrochlorothiazide (DYAZIDE) 50-25 MG capsule Take 1 capsule  by mouth every morning. 03/10/16   Lysbeth Penner, FNP  verapamil (CALAN-SR) 120 MG CR tablet Take 1 tablet (120 mg total) by mouth at bedtime. 03/10/16   Lysbeth Penner, FNP    Family History Family History  Problem Relation Age of Onset  . Hypertension Mother   . Diabetes Sister   . Asthma Brother   . Diabetes Brother   . Anesthesia problems Neg Hx     Social History Social History  Substance Use Topics  . Smoking status: Current Every Day Smoker    Packs/day: 1.00    Types: Cigarettes  . Smokeless tobacco: Never Used  . Alcohol  use Yes     Comment: weekends     Allergies   Penicillins; Sulfonamide derivatives; and Flagyl [metronidazole]   Review of Systems Review of Systems  All other systems reviewed and are negative.    Physical Exam Updated Vital Signs BP (!) 185/124 (BP Location: Left Arm)   Pulse 65   Temp 97.6 F (36.4 C)   Resp 18   SpO2 100%   Physical Exam  Constitutional: She appears well-developed and well-nourished. No distress.  HENT:  Head: Atraumatic.  Mouth/Throat: Oropharynx is clear and moist.  Eyes: Conjunctivae are normal.  Neck: Neck supple.  Cardiovascular: Normal rate and regular rhythm.   Pulmonary/Chest: Effort normal and breath sounds normal.  Abdominal: Soft. There is no tenderness.  Neurological: She is alert.  Skin: No rash noted.  No urticaria rash noted throughout body. Several excoriation marks noted to the anterior chest and bilateral forearm from scratching but does not appears to be infection.  Psychiatric: She has a normal mood and affect.  Nursing note and vitals reviewed.    ED Treatments / Results  Labs (all labs ordered are listed, but only abnormal results are displayed) Labs Reviewed - No data to display  EKG  EKG Interpretation None       Radiology No results found.  Procedures Procedures (including critical care time)  Medications Ordered in ED Medications - No data to display   Initial Impression / Assessment and Plan / ED Course  I have reviewed the triage vital signs and the nursing notes.  Pertinent labs & imaging results that were available during my care of the patient were reviewed by me and considered in my medical decision making (see chart for details).  Clinical Course    BP (!) 187/111 (BP Location: Left Arm)   Pulse 77   Temp 97.6 F (36.4 C)   Resp 16   SpO2 99%    Final Clinical Impressions(s) / ED Diagnoses   Final diagnoses:  Itching  Essential hypertension    New Prescriptions New  Prescriptions   HYDROCHLOROTHIAZIDE (HYDRODIURIL) 25 MG TABLET    Take 1 tablet (25 mg total) by mouth daily.   HYDROXYZINE (ATARAX/VISTARIL) 25 MG TABLET    Take 1 tablet (25 mg total) by mouth every 6 (six) hours as needed for anxiety or itching.   VERAPAMIL (CALAN) 120 MG TABLET    Take 1 tablet (120 mg total) by mouth at bedtime.   3:25 PM Patient here with complaints of pruritus suspect is related to her blood pressure medication. She denies any other environmental changes. She is currently taking verapamil and metoprolol. She felt that the medication hasn't really provide adequate management of her blood pressure as her current blood pressure is 185/124. She does not have any symptoms to suggest hypertensive emergency. Plan to obtain labs to  rule out electrolyte abnormalities causing her pruritus. Benadryl given.  5:47 PM Labs are reassuring.  Since pt report pruritus started when she was placed on extended release Verapamil, I have review her prior charts and she is to be on a thiazide medication on top of the BB and CCB.  I encourage pt to continue taking metoprolol.  I will prescribe regular acting verapamil as well as HCTZ.  Pt is allergic to ACEi.  Encourage pt to f/u with PCP for further management.  Return precaution discussed.  Care discussed with Dr. Bland Span. Vistaril prescribe for itch.   Domenic Moras, PA-C 04/01/16 1759    Duffy Bruce, MD 04/02/16 [phone]

## 2016-05-08 ENCOUNTER — Emergency Department (HOSPITAL_COMMUNITY)
Admission: EM | Admit: 2016-05-08 | Discharge: 2016-05-08 | Disposition: A | Discharge status: Home / Self Care | Payer: BLUE CROSS/BLUE SHIELD | Attending: Emergency Medicine | Admitting: Emergency Medicine

## 2016-05-08 ENCOUNTER — Encounter (HOSPITAL_COMMUNITY): Payer: Self-pay | Admitting: Emergency Medicine

## 2016-05-08 DIAGNOSIS — Z8679 Personal history of other diseases of the circulatory system: Secondary | ICD-10-CM | POA: Insufficient documentation

## 2016-05-08 DIAGNOSIS — I952 Hypotension due to drugs: Secondary | ICD-10-CM | POA: Diagnosis not present

## 2016-05-08 DIAGNOSIS — R55 Syncope and collapse: Secondary | ICD-10-CM

## 2016-05-08 DIAGNOSIS — T465X5A Adverse effect of other antihypertensive drugs, initial encounter: Secondary | ICD-10-CM | POA: Insufficient documentation

## 2016-05-08 DIAGNOSIS — T448X5A Adverse effect of centrally-acting and adrenergic-neuron-blocking agents, initial encounter: Secondary | ICD-10-CM | POA: Insufficient documentation

## 2016-05-08 DIAGNOSIS — Z79899 Other long term (current) drug therapy: Secondary | ICD-10-CM | POA: Insufficient documentation

## 2016-05-08 DIAGNOSIS — Z8673 Personal history of transient ischemic attack (TIA), and cerebral infarction without residual deficits: Secondary | ICD-10-CM | POA: Insufficient documentation

## 2016-05-08 DIAGNOSIS — F17210 Nicotine dependence, cigarettes, uncomplicated: Secondary | ICD-10-CM | POA: Insufficient documentation

## 2016-05-08 LAB — BASIC METABOLIC PANEL
ANION GAP: 12 (ref 5–15)
BUN: 15 mg/dL (ref 6–20)
CHLORIDE: 101 mmol/L (ref 101–111)
CO2: 24 mmol/L (ref 22–32)
Calcium: 9 mg/dL (ref 8.9–10.3)
Creatinine, Ser: 1.59 mg/dL — ABNORMAL HIGH (ref 0.44–1.00)
GFR calc non Af Amer: 36 mL/min — ABNORMAL LOW (ref >60)
GFR, EST AFRICAN AMERICAN: 42 mL/min — AB (ref >60)
Glucose, Bld: 97 mg/dL (ref 65–99)
POTASSIUM: 3.2 mmol/L — AB (ref 3.5–5.1)
SODIUM: 137 mmol/L (ref 135–145)

## 2016-05-08 LAB — URINE MICROSCOPIC-ADD ON: RBC / HPF: NONE SEEN RBC/hpf (ref 0–5)

## 2016-05-08 LAB — URINALYSIS, ROUTINE W REFLEX MICROSCOPIC
GLUCOSE, UA: NEGATIVE mg/dL
HGB URINE DIPSTICK: NEGATIVE
Ketones, ur: 15 mg/dL — AB
Nitrite: NEGATIVE
PROTEIN: 100 mg/dL — AB
SPECIFIC GRAVITY, URINE: 1.023 (ref 1.005–1.030)
pH: 6 (ref 5.0–8.0)

## 2016-05-08 LAB — CBC
HCT: 40.1 % (ref 36.0–46.0)
HEMOGLOBIN: 13.9 g/dL (ref 12.0–15.0)
MCH: 30.5 pg (ref 26.0–34.0)
MCHC: 34.7 g/dL (ref 30.0–36.0)
MCV: 88.1 fL (ref 78.0–100.0)
Platelets: 190 10*3/uL (ref 150–400)
RBC: 4.55 MIL/uL (ref 3.87–5.11)
RDW: 14.3 % (ref 11.5–15.5)
WBC: 7.3 10*3/uL (ref 4.0–10.5)

## 2016-05-08 LAB — CBG MONITORING, ED: GLUCOSE-CAPILLARY: 100 mg/dL — AB (ref 65–99)

## 2016-05-08 MED ORDER — POTASSIUM CHLORIDE CRYS ER 20 MEQ PO TBCR
40.0000 meq | EXTENDED_RELEASE_TABLET | Freq: Once | ORAL | Status: AC
  Administered 2016-05-08: 40 meq via ORAL
  Filled 2016-05-08: qty 2

## 2016-05-08 MED ORDER — ONDANSETRON 4 MG PO TBDP
4.0000 mg | ORAL_TABLET | Freq: Once | ORAL | Status: AC
  Administered 2016-05-08: 4 mg via ORAL
  Filled 2016-05-08: qty 1

## 2016-05-08 MED ORDER — ACETAMINOPHEN 325 MG PO TABS
650.0000 mg | ORAL_TABLET | Freq: Once | ORAL | Status: AC
  Administered 2016-05-08: 650 mg via ORAL
  Filled 2016-05-08: qty 2

## 2016-05-08 MED ORDER — SODIUM CHLORIDE 0.9 % IV BOLUS (SEPSIS)
1000.0000 mL | Freq: Once | INTRAVENOUS | Status: AC
  Administered 2016-05-08: 1000 mL via INTRAVENOUS

## 2016-05-08 NOTE — ED Triage Notes (Signed)
PT was seen at a clinic today for HTN. PT's BP was 160s over 100s. PT was given "a tablet" and sent home. EMS is not aware of med that was administered and clinic did not answer phone. PT drove herself home and began to feel weak, clammy, and sweaty. PT got home, was able to walk inside and then "fell into her chair" and lost consciousness. PT reports her cousin told her she vomited during this episode and became very sweaty. PT denies chest pain, SOB. EMS BP was 102/70 at scene. PT's BP returned to 160/100 in route. PT reports she is now nauseous, lightheaded, and has a headache.

## 2016-05-08 NOTE — Discharge Instructions (Signed)
Please read and follow all provided instructions.  Your diagnoses today include:  1. Syncope, unspecified syncope type   2. Hypotension due to drugs     Tests performed today include:  An EKG of your heart  Cardiac enzymes - a blood test for heart muscle damage  Blood counts and electrolytes - low potassium  Vital signs. See below for your results today.   Medications prescribed:   None  Take any prescribed medications only as directed.  Follow-up instructions: Please follow-up with your primary care provider as soon as you can for further evaluation of your symptoms.   Double your fluid intake for the next 24 hours.   Return instructions:  SEEK IMMEDIATE MEDICAL ATTENTION IF:  You have severe chest pain, especially if the pain is crushing or pressure-like and spreads to the arms, back, neck, or jaw, or if you have sweating, nausea (feeling sick to your stomach), or shortness of breath. THIS IS AN EMERGENCY. Don't wait to see if the pain will go away. Get medical help at once. Call 911 or 0 (operator). DO NOT drive yourself to the hospital.   You wake from sleep with chest pain or shortness of breath.  You feel dizzy or faint.  You have chest pain not typical of your usual pain for which you originally saw your caregiver.   You have any other emergent concerns regarding your health.  Your vital signs today were: BP 150/96    Pulse 77    Temp 97.7 F (36.5 C) (Oral)    Resp 16    Ht 5\' 7"  (1.702 m)    Wt 72.6 kg    SpO2 97%    BMI 25.06 kg/m  If your blood pressure (BP) was elevated above 135/85 this visit, please have this repeated by your doctor within one month. --------------

## 2016-05-08 NOTE — ED Provider Notes (Signed)
[facility] DEPT Provider Note   CSN: [id number] Arrival date & time: 05/08/16  1347     History   Chief Complaint Chief Complaint  Patient presents with  . Loss of Consciousness    HPI Karen Ochoa is a 50 y.o. female.  Patient with history of lacunar infarcts presents with complaint of syncope. Patient went to her primary care physician today for blood pressure medication management. While there she was given 100 mg PO labetalol and 0.1 mg PO clonidine. Patient was sent home. Patient states that while driving she became very lightheaded and dizzy. She drove to her cousin's house which was nearby. Once there, she passed out into a chair and did not injure herself. Vomiting x 1. No CP or SOB. EMS was called. The pressure was reportedly 102/70. This then improved. Patient currently complains of nausea. She is very fatigued. She has a headache which started after her symptoms occurred. States that she has been eating and drinking well previously. No history of arrhythmia. Patient denies signs of stroke including: facial droop, slurred speech, aphasia, weakness/numbness in extremities, imbalance/trouble walking. The onset of this condition was acute. The course is constant. Aggravating factors: none. Alleviating factors: none.         Past Medical History:  Diagnosis Date  . Fibroids   . Hx MRSA infection   . Hx-TIA (transient ischemic attack)    permanent insomnia  . Hypertension   . Insomnia due to medical condition     Patient Active Problem List   Diagnosis Date Noted  . SMOKER 06/18/2009  . SYNCOPE 06/18/2009  . INSOMNIA, CHRONIC 05/27/2009  . HYPERTENSION 05/27/2009  . STROKE 05/27/2009  . HEADACHE, CHRONIC, HX OF 05/27/2009    Past Surgical History:  Procedure Laterality Date  . WISDOM TOOTH EXTRACTION      OB History    Gravida Para Term Preterm AB Living   4 0 0 0 4 0   SAB TAB Ectopic Multiple Live Births   4 0 0 0         Home Medications     Prior to Admission medications   Medication Sig Start Date End Date Taking? Authorizing Provider  cloNIDine (CATAPRES) 0.1 MG tablet Take 0.1 mg by mouth once.   Yes Historical Provider, MD  hydrochlorothiazide (HYDRODIURIL) 25 MG tablet Take 1 tablet (25 mg total) by mouth daily. 04/01/16  Yes Domenic Moras, PA-C  hydrOXYzine (ATARAX/VISTARIL) 25 MG tablet Take 1 tablet (25 mg total) by mouth every 6 (six) hours as needed for anxiety or itching. 04/01/16  Yes Domenic Moras, PA-C  labetalol (NORMODYNE) 100 MG tablet Take 100 mg by mouth once.   Yes Historical Provider, MD  metoprolol tartrate (LOPRESSOR) 25 MG tablet Take 1 tablet (25 mg total) by mouth 2 (two) times daily. 03/10/16  Yes Lysbeth Penner, FNP  verapamil (VERELAN PM) 120 MG 24 hr capsule Take 120 mg by mouth at bedtime.   Yes Historical Provider, MD  butalbital-acetaminophen-caffeine (FIORICET) 50-325-40 MG tablet Take 1-2 tablets by mouth every 6 (six) hours as needed for headache. Patient not taking: Reported on 05/08/2016 02/13/16 02/12/17  Domenic Moras, PA-C  diphenhydramine-acetaminophen (TYLENOL PM) 25-500 MG TABS tablet Take 2 tablets by mouth at bedtime as needed (sleep).    Historical Provider, MD  ibuprofen (ADVIL,MOTRIN) 200 MG tablet Take 800 mg by mouth every 6 (six) hours as needed for headache or moderate pain.     Historical Provider, MD  Ibuprofen-Diphenhydramine Cit (ADVIL PM  PO) Take 1 capsule by mouth at bedtime as needed (sleep).     Historical Provider, MD  verapamil (CALAN) 120 MG tablet Take 1 tablet (120 mg total) by mouth at bedtime. 04/01/16   Domenic Moras, PA-C    Family History Family History  Problem Relation Age of Onset  . Hypertension Mother   . Diabetes Sister   . Asthma Brother   . Diabetes Brother   . Anesthesia problems Neg Hx     Social History Social History  Substance Use Topics  . Smoking status: Current Every Day Smoker    Packs/day: 1.00    Types: Cigarettes  . Smokeless tobacco: Never  Used  . Alcohol use Yes     Comment: weekends     Allergies   Penicillins; Sulfonamide derivatives; and Flagyl [metronidazole]   Review of Systems Review of Systems  Constitutional: Positive for fatigue. Negative for fever.  HENT: Negative for congestion, dental problem, rhinorrhea, sinus pressure and sore throat.   Eyes: Negative for photophobia, discharge, redness and visual disturbance.  Respiratory: Negative for cough and shortness of breath.   Cardiovascular: Negative for chest pain.  Gastrointestinal: Positive for nausea and vomiting. Negative for abdominal pain and diarrhea.  Genitourinary: Negative for dysuria.  Musculoskeletal: Negative for gait problem, myalgias, neck pain and neck stiffness.  Skin: Negative for rash.  Neurological: Positive for syncope, light-headedness and headaches. Negative for facial asymmetry, speech difficulty, weakness and numbness.  Psychiatric/Behavioral: Negative for confusion.     Physical Exam Updated Vital Signs BP 138/95   Pulse 75   Temp 97.7 F (36.5 C) (Oral)   Resp 16   Ht 5\' 7"  (1.702 m)   Wt 72.6 kg   SpO2 99%   BMI 25.06 kg/m   Physical Exam  Constitutional: She is oriented to person, place, and time. She appears well-developed and well-nourished.  HENT:  Head: Normocephalic and atraumatic.  Right Ear: Tympanic membrane, external ear and ear canal normal.  Left Ear: Tympanic membrane, external ear and ear canal normal.  Nose: Nose normal.  Mouth/Throat: Uvula is midline, oropharynx is clear and moist and mucous membranes are normal.  Eyes: Conjunctivae, EOM and lids are normal. Pupils are equal, round, and reactive to light. Right eye exhibits no discharge. Left eye exhibits no discharge. Right eye exhibits no nystagmus. Left eye exhibits no nystagmus.  Neck: Normal range of motion. Neck supple.  Cardiovascular: Normal rate, regular rhythm and normal heart sounds.   Pulmonary/Chest: Effort normal and breath sounds  normal.  Abdominal: Soft. There is no tenderness.  Musculoskeletal:       Cervical back: She exhibits normal range of motion, no tenderness and no bony tenderness.  Neurological: She is alert and oriented to person, place, and time. She has normal strength and normal reflexes. No cranial nerve deficit or sensory deficit. She displays a negative Romberg sign. Coordination and gait normal. GCS eye subscore is 4. GCS verbal subscore is 5. GCS motor subscore is 6.  Skin: Skin is warm and dry.  Psychiatric: She has a normal mood and affect.  Nursing note and vitals reviewed.    ED Treatments / Results  Labs (all labs ordered are listed, but only abnormal results are displayed) Labs Reviewed  BASIC METABOLIC PANEL - Abnormal; Notable for the following:       Result Value   Potassium 3.2 (*)    Creatinine, Ser 1.59 (*)    GFR calc non Af Amer 36 (*)    GFR  calc Af Amer 42 (*)    All other components within normal limits  CBG MONITORING, ED - Abnormal; Notable for the following:    Glucose-Capillary 100 (*)    All other components within normal limits  CBC  URINALYSIS, ROUTINE W REFLEX MICROSCOPIC (NOT AT [facility])    EKG  EKG Interpretation  Date/Time:  Friday May 08 2016 13:52:53 EDT Ventricular Rate:  76 PR Interval:    QRS Duration: 81 QT Interval:  481 QTC Calculation: 541 R Axis:   50 Text Interpretation:  Sinus rhythm Probable left atrial enlargement Abnormal T, consider ischemia, lateral leads Prolonged QT interval Baseline wander in lead(s) II III aVR aVL aVF No significant change since last tracing Confirmed by LITTLE MD, RACHEL [phone]) on 05/08/2016 2:55:26 PM       Procedures Procedures (including critical care time)  Medications Ordered in ED Medications  potassium chloride SA (K-DUR,KLOR-CON) CR tablet 40 mEq (not administered)  sodium chloride 0.9 % bolus 1,000 mL (1,000 mLs Intravenous New Bag/Given 05/08/16 1508)  ondansetron (ZOFRAN-ODT) disintegrating  tablet 4 mg (4 mg Oral Given 05/08/16 1458)  acetaminophen (TYLENOL) tablet 650 mg (650 mg Oral Given 05/08/16 1458)     Initial Impression / Assessment and Plan / ED Course  I have reviewed the triage vital signs and the nursing notes.  Pertinent labs & imaging results that were available during my care of the patient were reviewed by me and considered in my medical decision making (see chart for details).  Clinical Course   Patient seen and examined. Work-up initiated. Medications ordered.   Vital signs reviewed and are as follows: BP 138/95   Pulse 75   Temp 97.7 F (36.5 C) (Oral)   Resp 16   Ht 5\' 7"  (1.702 m)   Wt 72.6 kg   SpO2 99%   BMI 25.06 kg/m   3:34 PM Pt stable. Discussed with Dr. Rex Kras who will see.   4:32 PM Patient seen by Dr. Rex Kras. Will d/c when fluids completed and patient has ambulated. Encouraged rest and good fluid intake.   Patient urged to return with worsening symptoms or other concerns. Patient verbalized understanding and agrees with plan.    Final Clinical Impressions(s) / ED Diagnoses   Final diagnoses:  Syncope, unspecified syncope type  Hypotension due to drugs   Patient with syncopal episode today after receiving antihypertensives at her primary care physician. She had a positive prodrome and vomiting consistent with orthostatic hypotension. Patient mildly orthostatic in emergency department. She was treated with fluids with improvement. She was given potassium for mild hyperkalemia. EKG without ischemic change. She has a headache but this started after her episode. No focal neurological deficits. Do not suspect intracranial hemorrhage. Do not suspect PE. Do not suspect ACS.  New Prescriptions Current Discharge Medication List       Carlisle Cater, PA-C 05/08/16 [place], MD 05/09/16 [phone]

## 2016-05-10 ENCOUNTER — Emergency Department (HOSPITAL_COMMUNITY)
Admission: EM | Admit: 2016-05-10 | Discharge: 2016-05-10 | Disposition: A | Discharge status: Home / Self Care | Payer: BLUE CROSS/BLUE SHIELD | Attending: Emergency Medicine | Admitting: Emergency Medicine

## 2016-05-10 ENCOUNTER — Emergency Department (HOSPITAL_COMMUNITY): Payer: BLUE CROSS/BLUE SHIELD

## 2016-05-10 ENCOUNTER — Encounter (HOSPITAL_COMMUNITY): Payer: Self-pay

## 2016-05-10 DIAGNOSIS — I10 Essential (primary) hypertension: Secondary | ICD-10-CM | POA: Diagnosis not present

## 2016-05-10 DIAGNOSIS — F17210 Nicotine dependence, cigarettes, uncomplicated: Secondary | ICD-10-CM | POA: Diagnosis not present

## 2016-05-10 DIAGNOSIS — R519 Headache, unspecified: Secondary | ICD-10-CM

## 2016-05-10 DIAGNOSIS — Z5181 Encounter for therapeutic drug level monitoring: Secondary | ICD-10-CM | POA: Diagnosis not present

## 2016-05-10 DIAGNOSIS — Z8673 Personal history of transient ischemic attack (TIA), and cerebral infarction without residual deficits: Secondary | ICD-10-CM | POA: Diagnosis not present

## 2016-05-10 DIAGNOSIS — R51 Headache: Secondary | ICD-10-CM | POA: Diagnosis not present

## 2016-05-10 LAB — I-STAT CHEM 8, ED
BUN: 28 mg/dL — ABNORMAL HIGH (ref 6–20)
CALCIUM ION: 1.08 mmol/L — AB (ref 1.15–1.40)
CREATININE: 1.3 mg/dL — AB (ref 0.44–1.00)
Chloride: 103 mmol/L (ref 101–111)
Glucose, Bld: 110 mg/dL — ABNORMAL HIGH (ref 65–99)
HEMATOCRIT: 44 % (ref 36.0–46.0)
HEMOGLOBIN: 15 g/dL (ref 12.0–15.0)
Potassium: 3.7 mmol/L (ref 3.5–5.1)
SODIUM: 140 mmol/L (ref 135–145)
TCO2: 28 mmol/L (ref 0–100)

## 2016-05-10 LAB — COMPREHENSIVE METABOLIC PANEL
ALT: 16 U/L (ref 14–54)
AST: 20 U/L (ref 15–41)
Albumin: 4.2 g/dL (ref 3.5–5.0)
Alkaline Phosphatase: 97 U/L (ref 38–126)
Anion gap: 7 (ref 5–15)
BILIRUBIN TOTAL: 0.5 mg/dL (ref 0.3–1.2)
BUN: 24 mg/dL — ABNORMAL HIGH (ref 6–20)
CHLORIDE: 105 mmol/L (ref 101–111)
CO2: 27 mmol/L (ref 22–32)
CREATININE: 1.29 mg/dL — AB (ref 0.44–1.00)
Calcium: 9 mg/dL (ref 8.9–10.3)
GFR, EST AFRICAN AMERICAN: 54 mL/min — AB (ref >60)
GFR, EST NON AFRICAN AMERICAN: 47 mL/min — AB (ref >60)
Glucose, Bld: 116 mg/dL — ABNORMAL HIGH (ref 65–99)
POTASSIUM: 3.5 mmol/L (ref 3.5–5.1)
Sodium: 139 mmol/L (ref 135–145)
TOTAL PROTEIN: 7.2 g/dL (ref 6.5–8.1)

## 2016-05-10 LAB — APTT: aPTT: 29 seconds (ref 24–36)

## 2016-05-10 LAB — DIFFERENTIAL
BASOS PCT: 1 %
Basophils Absolute: 0 10*3/uL (ref 0.0–0.1)
EOS ABS: 0.1 10*3/uL (ref 0.0–0.7)
Eosinophils Relative: 2 %
LYMPHS ABS: 2.3 10*3/uL (ref 0.7–4.0)
Lymphocytes Relative: 35 %
MONO ABS: 0.4 10*3/uL (ref 0.1–1.0)
MONOS PCT: 6 %
Neutro Abs: 3.8 10*3/uL (ref 1.7–7.7)
Neutrophils Relative %: 56 %

## 2016-05-10 LAB — CBC
HEMATOCRIT: 42 % (ref 36.0–46.0)
Hemoglobin: 14 g/dL (ref 12.0–15.0)
MCH: 30.5 pg (ref 26.0–34.0)
MCHC: 33.3 g/dL (ref 30.0–36.0)
MCV: 91.5 fL (ref 78.0–100.0)
Platelets: 257 10*3/uL (ref 150–400)
RBC: 4.59 MIL/uL (ref 3.87–5.11)
RDW: 14.7 % (ref 11.5–15.5)
WBC: 6.7 10*3/uL (ref 4.0–10.5)

## 2016-05-10 LAB — CBG MONITORING, ED: Glucose-Capillary: 159 mg/dL — ABNORMAL HIGH (ref 65–99)

## 2016-05-10 LAB — PROTIME-INR
INR: 0.93
Prothrombin Time: 12.5 seconds (ref 11.4–15.2)

## 2016-05-10 LAB — I-STAT TROPONIN, ED: TROPONIN I, POC: 0 ng/mL (ref 0.00–0.08)

## 2016-05-10 MED ORDER — PROCHLORPERAZINE EDISYLATE 5 MG/ML IJ SOLN
10.0000 mg | Freq: Once | INTRAMUSCULAR | Status: AC
  Administered 2016-05-10: 10 mg via INTRAVENOUS
  Filled 2016-05-10: qty 2

## 2016-05-10 NOTE — ED Triage Notes (Signed)
Pt c/o being lightheaded and HA since Friday. Pt was seen for syncope at [facility] 10/20. Pt denies n/v. Pt A+OX4, speaking in complete sentences.

## 2016-05-10 NOTE — ED Provider Notes (Signed)
[facility] DEPT Provider Note   CSN: [id number] Arrival date & time: 05/10/16  1349     History   Chief Complaint Chief Complaint  Patient presents with  . Headache    HPI Karen Ochoa is a 50 y.o. female.  The history is provided by the patient.  Headache   Pertinent negatives include no shortness of breath.  Patient presents with a headache. She was seen in the ER 2 days ago for syncope. She been seen by her primary care that day for hypertension start on 2 new medicines. She then became lightheaded and had a near syncopal/syncope. Did not hit her head at that time. States she was worked up in the ER and sent home. States that starting after that she has had a headache. His been dull. It is more on the back of her head. States she had a previous history of migraines but that was when she was 50 years old. No vision changes. No nausea vomiting. No nausea vomiting. No confusion. No chest pain.  Past Medical History:  Diagnosis Date  . Fibroids   . Hx MRSA infection   . Hx-TIA (transient ischemic attack)    permanent insomnia  . Hypertension   . Insomnia due to medical condition     Patient Active Problem List   Diagnosis Date Noted  . SMOKER 06/18/2009  . SYNCOPE 06/18/2009  . INSOMNIA, CHRONIC 05/27/2009  . HYPERTENSION 05/27/2009  . STROKE 05/27/2009  . HEADACHE, CHRONIC, HX OF 05/27/2009    Past Surgical History:  Procedure Laterality Date  . WISDOM TOOTH EXTRACTION      OB History    Gravida Para Term Preterm AB Living   4 0 0 0 4 0   SAB TAB Ectopic Multiple Live Births   4 0 0 0         Home Medications    Prior to Admission medications   Medication Sig Start Date End Date Taking? Authorizing Provider  metoprolol tartrate (LOPRESSOR) 25 MG tablet Take 1 tablet (25 mg total) by mouth 2 (two) times daily. 03/10/16  Yes Lysbeth Penner, FNP  triamterene-hydrochlorothiazide (MAXZIDE-25) 37.5-25 MG tablet Take 1 tablet by mouth daily.  05/08/16   Yes Historical Provider, MD  verapamil (VERELAN PM) 120 MG 24 hr capsule Take 60 mg by mouth 2 (two) times daily.    Yes Historical Provider, MD  hydrochlorothiazide (HYDRODIURIL) 25 MG tablet Take 1 tablet (25 mg total) by mouth daily. Patient not taking: Reported on 05/10/2016 04/01/16   Domenic Moras, PA-C  hydrOXYzine (ATARAX/VISTARIL) 25 MG tablet Take 1 tablet (25 mg total) by mouth every 6 (six) hours as needed for anxiety or itching. 04/01/16   Domenic Moras, PA-C  zolpidem (AMBIEN) 10 MG tablet Take 10 mg by mouth at bedtime.  05/09/16   Historical Provider, MD    Family History Family History  Problem Relation Age of Onset  . Hypertension Mother   . Diabetes Sister   . Asthma Brother   . Diabetes Brother   . Anesthesia problems Neg Hx     Social History Social History  Substance Use Topics  . Smoking status: Current Every Day Smoker    Packs/day: 1.00    Types: Cigarettes  . Smokeless tobacco: Never Used  . Alcohol use Yes     Comment: weekends     Allergies   Penicillins; Sulfonamide derivatives; Clonidine derivatives; Flagyl [metronidazole]; and Labetalol   Review of Systems Review of Systems  Constitutional: Negative  for appetite change.  HENT: Negative for ear discharge.   Respiratory: Negative for shortness of breath.   Cardiovascular: Negative for chest pain.  Gastrointestinal: Negative for abdominal pain.  Genitourinary: Negative for dysuria.  Musculoskeletal: Negative for back pain.  Neurological: Positive for headaches.     Physical Exam Updated Vital Signs BP 145/95 (BP Location: Right Arm)   Pulse 68   Temp 97.6 F (36.4 C) (Oral)   Resp 18   Ht 5\' 7"  (1.702 m)   Wt 167 lb 1 oz (75.8 kg)   SpO2 99%   BMI 26.17 kg/m   Physical Exam  Constitutional: She appears well-developed.  HENT:  Head: Atraumatic.  Eyes: EOM are normal. Pupils are equal, round, and reactive to light.  Neck: Neck supple.  Cardiovascular: Normal rate.   Pulmonary/Chest:  Effort normal.  Abdominal: Soft. There is no tenderness.  Neurological: She is alert.  Skin: Skin is warm. Capillary refill takes less than 2 seconds.  Psychiatric: She has a normal mood and affect.     ED Treatments / Results  Labs (all labs ordered are listed, but only abnormal results are displayed) Labs Reviewed  COMPREHENSIVE METABOLIC PANEL - Abnormal; Notable for the following:       Result Value   Glucose, Bld 116 (*)    BUN 24 (*)    Creatinine, Ser 1.29 (*)    GFR calc non Af Amer 47 (*)    GFR calc Af Amer 54 (*)    All other components within normal limits  CBG MONITORING, ED - Abnormal; Notable for the following:    Glucose-Capillary 159 (*)    All other components within normal limits  I-STAT CHEM 8, ED - Abnormal; Notable for the following:    BUN 28 (*)    Creatinine, Ser 1.30 (*)    Glucose, Bld 110 (*)    Calcium, Ion 1.08 (*)    All other components within normal limits  PROTIME-INR  APTT  CBC  DIFFERENTIAL  I-STAT TROPOININ, ED    EKG  EKG Interpretation None       Radiology Ct Head Wo Contrast  Result Date: 05/10/2016 CLINICAL DATA:  50 year old female with history of lightheadedness and headaches for the past 2 days. EXAM: CT HEAD WITHOUT CONTRAST TECHNIQUE: Contiguous axial images were obtained from the base of the skull through the vertex without intravenous contrast. COMPARISON:  Head CT 02/13/2016. FINDINGS: Brain: Cavum septum pellucidum et verge (normal anatomical variant) incidentally noted. Patchy and confluent areas of decreased attenuation are noted throughout the deep and periventricular white matter of the cerebral hemispheres bilaterally, compatible with chronic microvascular ischemic disease. No evidence of acute infarction, hemorrhage, hydrocephalus, extra-axial collection or mass lesion/mass effect. Vascular: No hyperdense vessel or unexpected calcification. Skull: Normal. Negative for fracture or focal lesion. Sinuses/Orbits: No  acute finding. Other: None. IMPRESSION: 1. No acute intracranial abnormalities. 2. Extensive chronic microvascular ischemic changes in the cerebral white matter redemonstrated, as above. Electronically Signed   By: Vinnie Langton M.D.   On: 05/10/2016 17:29    Procedures Procedures (including critical care time)  Medications Ordered in ED Medications  prochlorperazine (COMPAZINE) injection 10 mg (10 mg Intravenous Given 05/10/16 1751)     Initial Impression / Assessment and Plan / ED Course  I have reviewed the triage vital signs and the nursing notes.  Pertinent labs & imaging results that were available during my care of the patient were reviewed by me and considered in my medical decision  making (see chart for details).  Clinical Course    Patient with headache. Feels better after treatment. Head CT done since she has not had migraines since she was 50 years old. Has some renal insufficiency. His getting her blood pressure managed and controlled this could be contributing to it. Recent workup for near-syncope. Will discharge home. PCP can manage her blood pressure.  Final Clinical Impressions(s) / ED Diagnoses   Final diagnoses:  Acute nonintractable headache, unspecified headache type  Hypertension, unspecified type    New Prescriptions New Prescriptions   No medications on file     Davonna Belling, MD 05/10/16 1818

## 2016-05-10 NOTE — Discharge Instructions (Signed)
Follow with your doctor to manage your blood pressure.

## 2016-05-20 ENCOUNTER — Ambulatory Visit (HOSPITAL_COMMUNITY)
Admission: EM | Admit: 2016-05-20 | Discharge: 2016-05-20 | Disposition: A | Discharge status: Home / Self Care | Payer: BLUE CROSS/BLUE SHIELD | Attending: Emergency Medicine | Admitting: Emergency Medicine

## 2016-05-20 ENCOUNTER — Encounter (HOSPITAL_COMMUNITY): Payer: Self-pay | Admitting: Emergency Medicine

## 2016-05-20 DIAGNOSIS — K0889 Other specified disorders of teeth and supporting structures: Secondary | ICD-10-CM

## 2016-05-20 DIAGNOSIS — K029 Dental caries, unspecified: Secondary | ICD-10-CM

## 2016-05-20 MED ORDER — CLINDAMYCIN HCL 150 MG PO CAPS: 150.0000 mg | ORAL_CAPSULE | Freq: Four times a day (QID) | ORAL | 0 refills | Status: DC

## 2016-05-20 MED ORDER — KETOROLAC TROMETHAMINE 60 MG/2ML IM SOLN
60.0000 mg | Freq: Once | INTRAMUSCULAR | Status: AC
  Administered 2016-05-20: 60 mg via INTRAMUSCULAR

## 2016-05-20 MED ORDER — NAPROXEN 500 MG PO TABS: 500.0000 mg | ORAL_TABLET | Freq: Two times a day (BID) | ORAL | 0 refills | Status: DC

## 2016-05-20 MED ORDER — KETOROLAC TROMETHAMINE 60 MG/2ML IM SOLN
INTRAMUSCULAR | Status: AC
Start: 2016-05-20 — End: 2016-05-20
  Filled 2016-05-20: qty 2

## 2016-05-20 NOTE — ED Provider Notes (Signed)
CSN: [id number]     Arrival date & time 05/20/16  1903 History   None    Chief Complaint  Patient presents with  . Dental Pain   (Consider location/radiation/quality/duration/timing/severity/associated sxs/prior Treatment) Patient c/o dental pain for 2 days.  She has a cracked tooth on the right lower jaw that has been giving he trouble and she is trying to find a dentist.   The history is provided by the patient.  Dental Pain  Location:  Lower Lower teeth location:  31/RL 2nd molar Quality:  Aching Severity:  Moderate Onset quality:  Gradual Duration:  2 days Timing:  Constant Progression:  Worsening Chronicity:  New Relieved by:  None tried Worsened by:  Nothing Ineffective treatments:  None tried   Past Medical History:  Diagnosis Date  . Fibroids   . Hx MRSA infection   . Hx-TIA (transient ischemic attack)    permanent insomnia  . Hypertension   . Insomnia due to medical condition    Past Surgical History:  Procedure Laterality Date  . WISDOM TOOTH EXTRACTION     Family History  Problem Relation Age of Onset  . Hypertension Mother   . Diabetes Sister   . Asthma Brother   . Diabetes Brother   . Anesthesia problems Neg Hx    Social History  Substance Use Topics  . Smoking status: Current Every Day Smoker    Packs/day: 1.00    Types: Cigarettes  . Smokeless tobacco: Never Used  . Alcohol use Yes     Comment: weekends   OB History    Gravida Para Term Preterm AB Living   4 0 0 0 4 0   SAB TAB Ectopic Multiple Live Births   4 0 0 0       Review of Systems  Constitutional: Negative.   HENT: Positive for dental problem.   Eyes: Negative.   Respiratory: Negative.   Cardiovascular: Negative.   Gastrointestinal: Negative.   Endocrine: Negative.   Genitourinary: Negative.   Musculoskeletal: Negative.   Allergic/Immunologic: Negative.   Neurological: Negative.   Hematological: Negative.   Psychiatric/Behavioral: Negative.     Allergies   Penicillins; Sulfonamide derivatives; Clonidine derivatives; Flagyl [metronidazole]; and Labetalol  Home Medications   Prior to Admission medications   Medication Sig Start Date End Date Taking? Authorizing Provider  metoprolol tartrate (LOPRESSOR) 25 MG tablet Take 1 tablet (25 mg total) by mouth 2 (two) times daily. 03/10/16  Yes Lysbeth Penner, FNP  triamterene-hydrochlorothiazide (MAXZIDE-25) 37.5-25 MG tablet Take 1 tablet by mouth daily.  05/08/16  Yes Historical Provider, MD  verapamil (VERELAN PM) 120 MG 24 hr capsule Take 60 mg by mouth 2 (two) times daily.    Yes Historical Provider, MD  clindamycin (CLEOCIN) 150 MG capsule Take 1 capsule (150 mg total) by mouth every 6 (six) hours. 05/20/16   Lysbeth Penner, FNP  hydrochlorothiazide (HYDRODIURIL) 25 MG tablet Take 1 tablet (25 mg total) by mouth daily. Patient not taking: Reported on 05/10/2016 04/01/16   Domenic Moras, PA-C  hydrOXYzine (ATARAX/VISTARIL) 25 MG tablet Take 1 tablet (25 mg total) by mouth every 6 (six) hours as needed for anxiety or itching. 04/01/16   Domenic Moras, PA-C  naproxen (NAPROSYN) 500 MG tablet Take 1 tablet (500 mg total) by mouth 2 (two) times daily with a meal. 05/20/16   Lysbeth Penner, FNP  zolpidem (AMBIEN) 10 MG tablet Take 10 mg by mouth at bedtime.  05/09/16   Historical Provider, MD  Meds Ordered and Administered this Visit   Medications  ketorolac (TORADOL) injection 60 mg (not administered)    BP (!) 176/114 (BP Location: Right Arm)   Pulse 91   Temp 98.7 F (37.1 C) (Oral)   Resp 18   SpO2 99%  No data found.   Physical Exam  Constitutional: She appears well-developed and well-nourished.  HENT:  Head: Normocephalic and atraumatic.  Right Ear: External ear normal.  Left Ear: External ear normal.  Poor dentition with cracked tooth left lower molar  Eyes: EOM are normal. Pupils are equal, round, and reactive to light.  Neck: Normal range of motion. Neck supple.  Nursing note and  vitals reviewed.   Urgent Care Course   Clinical Course    Procedures (including critical care time)  Labs Review Labs Reviewed - No data to display  Imaging Review No results found.   Visual Acuity Review  Right Eye Distance:   Left Eye Distance:   Bilateral Distance:    Right Eye Near:   Left Eye Near:    Bilateral Near:         MDM   1. Pain, dental   2. Dental caries    Toradol 60mg  IM Naprosyn 500mg  one po bid prn #20     Lysbeth Penner, FNP 05/20/16 2036

## 2016-05-20 NOTE — ED Triage Notes (Signed)
The patient presented to the [facility] with a complaint of dental pain x 2 days. The patient reported a hx of a fractured tooth.

## 2016-05-29 ENCOUNTER — Ambulatory Visit: Payer: BLUE CROSS/BLUE SHIELD | Admitting: Nurse Practitioner

## 2016-11-16 IMAGING — CT CT HEAD W/O CM
3 of 4 series · 14 of 47 positions shown, 16 images · non-contrast
Comparison: MRI 04/23/2006.  CT 04/22/2006.

CLINICAL DATA: Right-sided headache. Several days duration.
Hypertension.

EXAM:
CT HEAD WITHOUT CONTRAST
TECHNIQUE: Contiguous axial images were obtained from the base of the skull
through the vertex without intravenous contrast.

[Series 2: head w/o · axial · non-contrast · 0.45mm/px · z∈[-218,-93]mm · 8 of 31 slices shown, 10 images]
[im 3/31  brain]
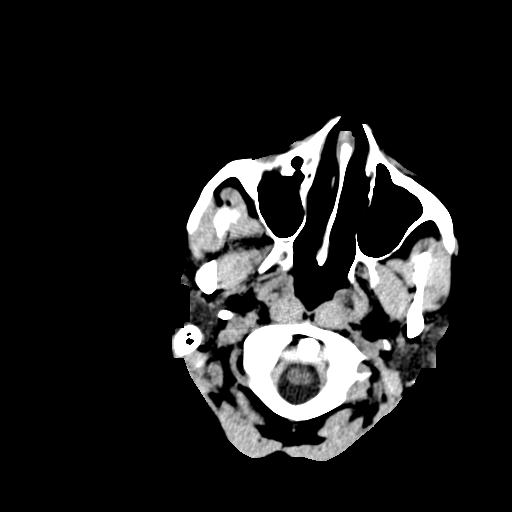
[im 3/31  bone]
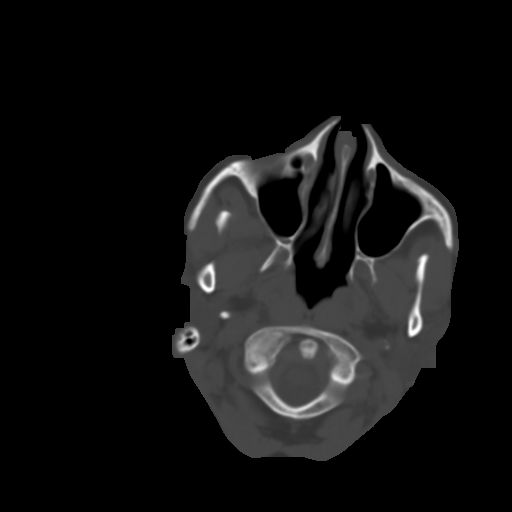
[im 7/31  brain]
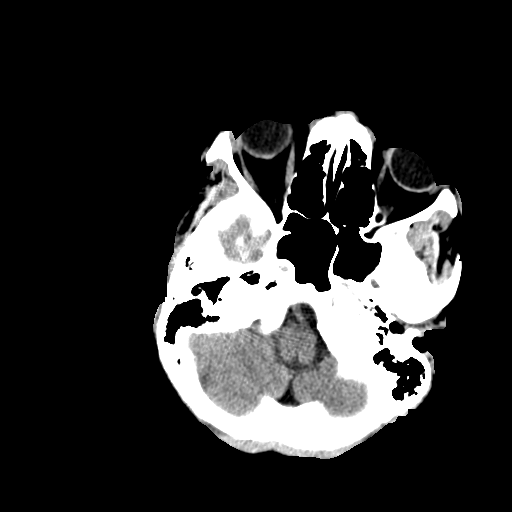
[im 11/31  brain]
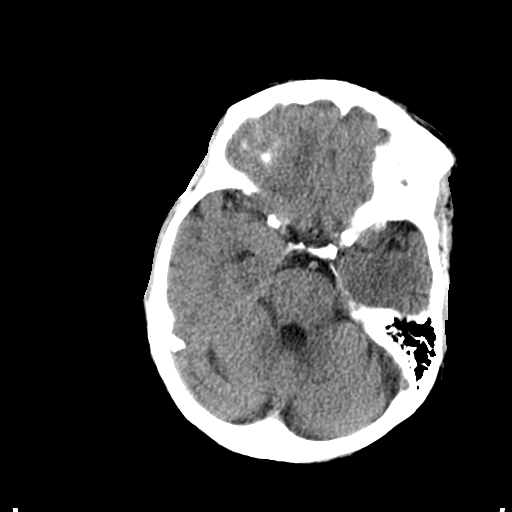
[im 13/31  brain]
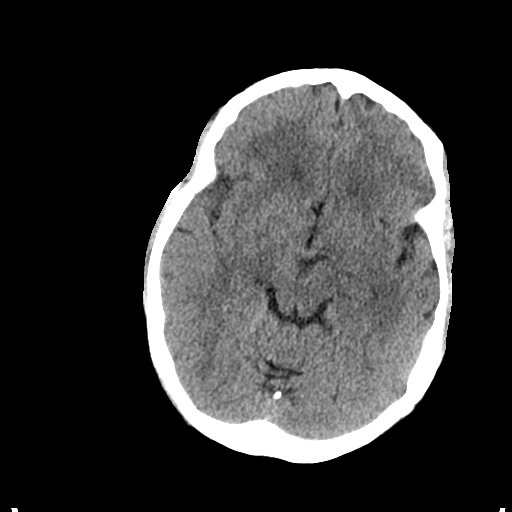
[im 18/31  brain]
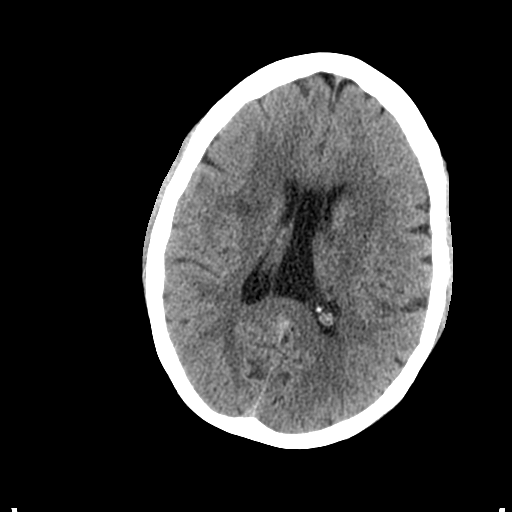
[im 18/31  bone]
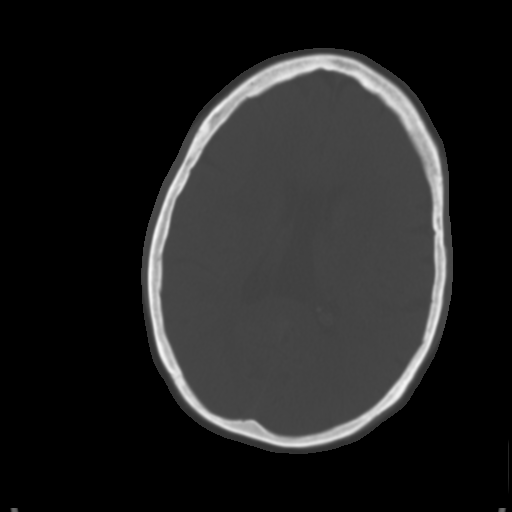
[im 20/31  brain]
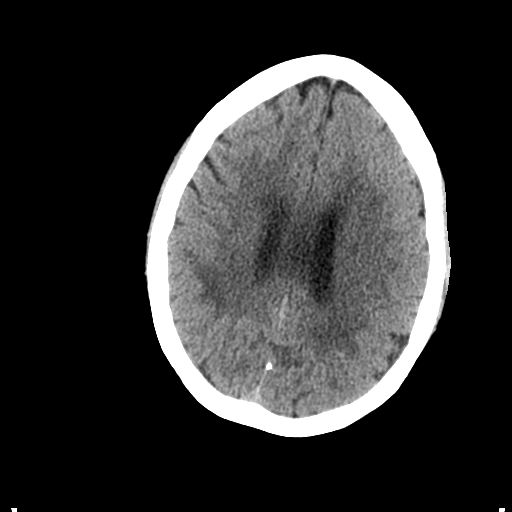
[im 24/31  brain]
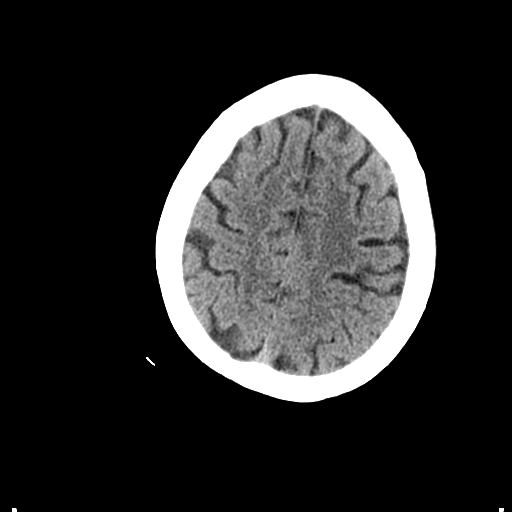
[im 28/31  brain]
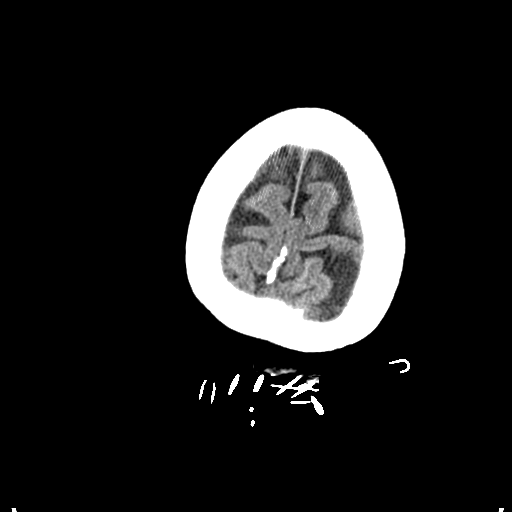

[Series 4: coronal · coronal · 0.26mm/px · 3 of 59 slices shown]
[im 20/59  brain]
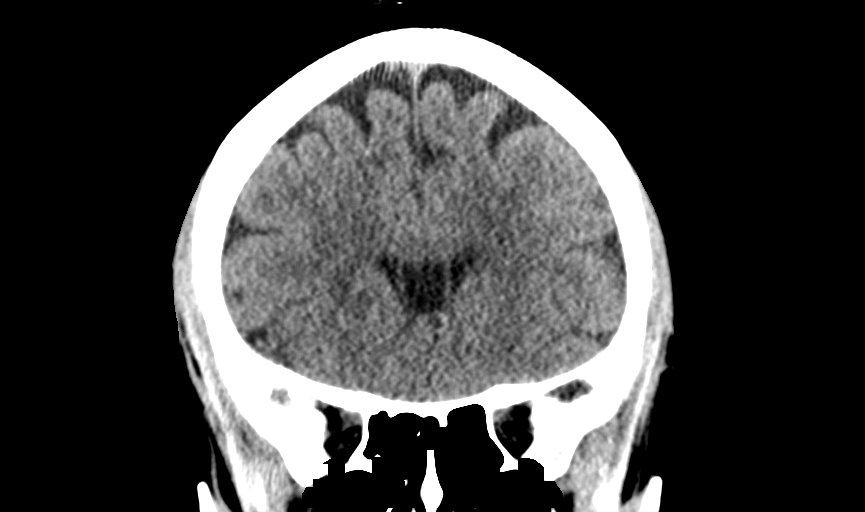
[im 26/59  brain]
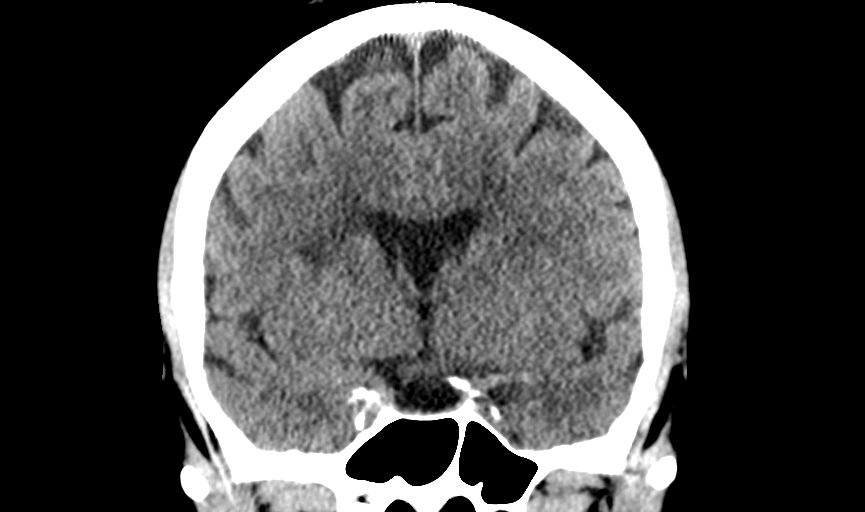
[im 33/59  brain]
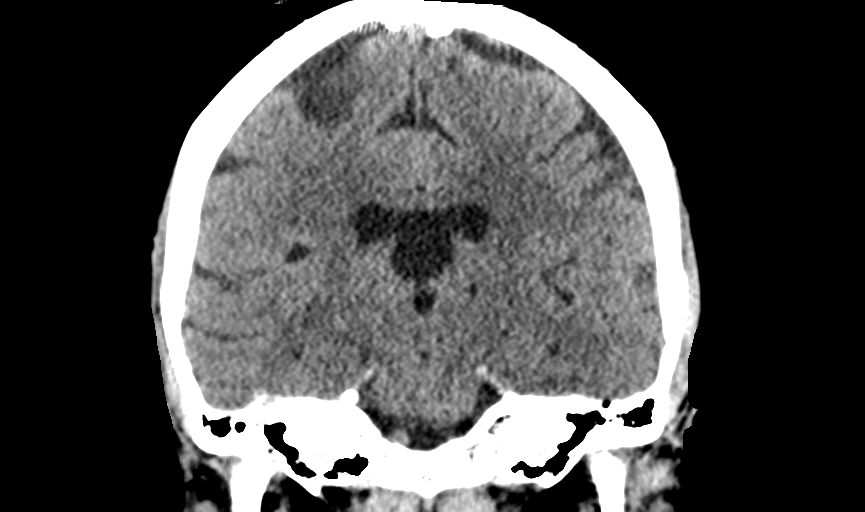

[Series 5: sagittal · sagittal · 0.28mm/px · 3 of 47 slices shown]
[im 19/47  brain]
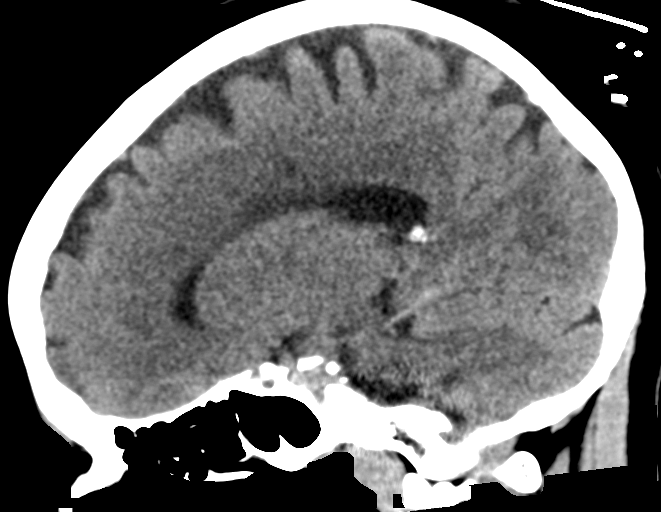
[im 24/47  brain]
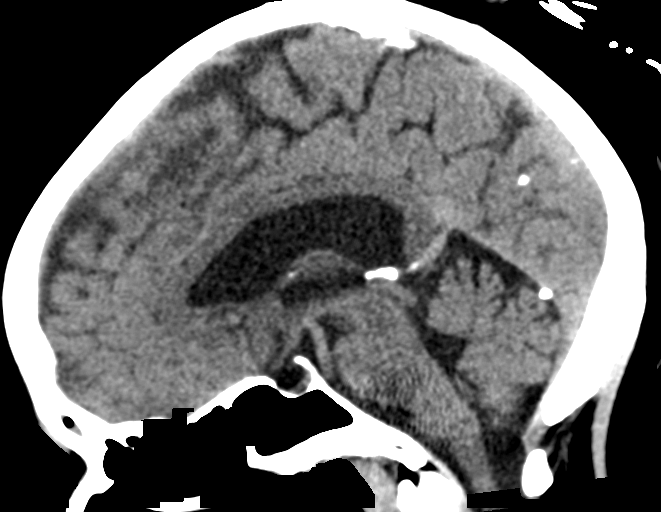
[im 28/47  brain]
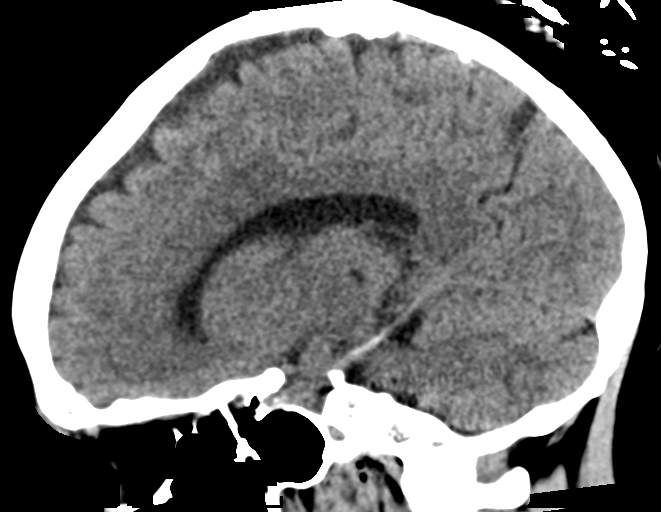

[14 of 47 positions shown; findings below may reference images not displayed]

FINDINGS: No visible change since the previous studies. Extensive chronic
small vessel disease throughout the white matter again demonstrated.
Old lacunar infarction left thalamus. No evidence of acute
infarction, mass lesion, hemorrhage, hydrocephalus or extra-axial
collection. No skull fracture. Hypoplastic frontal sinuses with a
small amount of fluid. There is atherosclerotic calcification of the
major vessels at the base of the brain.
IMPRESSION: No acute finding. Extensive chronic small vessel ischemic changes
throughout the hemispheric white matter as demonstrated previously.

## 2016-11-29 ENCOUNTER — Encounter (HOSPITAL_COMMUNITY): Payer: Self-pay | Admitting: Emergency Medicine

## 2016-11-29 ENCOUNTER — Emergency Department (HOSPITAL_COMMUNITY)
Admission: EM | Admit: 2016-11-29 | Discharge: 2016-11-29 | Disposition: A | Discharge status: Home / Self Care | Payer: BLUE CROSS/BLUE SHIELD | Attending: Emergency Medicine | Admitting: Emergency Medicine

## 2016-11-29 DIAGNOSIS — Z8673 Personal history of transient ischemic attack (TIA), and cerebral infarction without residual deficits: Secondary | ICD-10-CM | POA: Insufficient documentation

## 2016-11-29 DIAGNOSIS — Z79899 Other long term (current) drug therapy: Secondary | ICD-10-CM | POA: Insufficient documentation

## 2016-11-29 DIAGNOSIS — F17210 Nicotine dependence, cigarettes, uncomplicated: Secondary | ICD-10-CM | POA: Insufficient documentation

## 2016-11-29 DIAGNOSIS — I10 Essential (primary) hypertension: Secondary | ICD-10-CM | POA: Insufficient documentation

## 2016-11-29 DIAGNOSIS — R7989 Other specified abnormal findings of blood chemistry: Secondary | ICD-10-CM | POA: Insufficient documentation

## 2016-11-29 LAB — I-STAT CHEM 8, ED
BUN: 25 mg/dL — ABNORMAL HIGH (ref 6–20)
CALCIUM ION: 1.02 mmol/L — AB (ref 1.15–1.40)
CHLORIDE: 101 mmol/L (ref 101–111)
Creatinine, Ser: 2.1 mg/dL — ABNORMAL HIGH (ref 0.44–1.00)
Glucose, Bld: 108 mg/dL — ABNORMAL HIGH (ref 65–99)
HCT: 41 % (ref 36.0–46.0)
HEMOGLOBIN: 13.9 g/dL (ref 12.0–15.0)
POTASSIUM: 3.2 mmol/L — AB (ref 3.5–5.1)
SODIUM: 139 mmol/L (ref 135–145)
TCO2: 30 mmol/L (ref 0–100)

## 2016-11-29 MED ORDER — METOPROLOL TARTRATE 25 MG PO TABS
25.0000 mg | ORAL_TABLET | Freq: Once | ORAL | Status: AC
  Administered 2016-11-29: 25 mg via ORAL
  Filled 2016-11-29: qty 1

## 2016-11-29 MED ORDER — VERAPAMIL HCL ER 120 MG PO TBCR
120.0000 mg | EXTENDED_RELEASE_TABLET | Freq: Once | ORAL | Status: AC
  Administered 2016-11-29: 120 mg via ORAL
  Filled 2016-11-29: qty 1

## 2016-11-29 MED ORDER — HYDROCHLOROTHIAZIDE 12.5 MG PO TABS: 25.0000 mg | ORAL_TABLET | Freq: Every day | ORAL | 0 refills | Status: DC

## 2016-11-29 MED ORDER — METOPROLOL TARTRATE 25 MG PO TABS: 25.0000 mg | ORAL_TABLET | Freq: Two times a day (BID) | ORAL | 0 refills | Status: DC

## 2016-11-29 MED ORDER — HYDROCHLOROTHIAZIDE 12.5 MG PO CAPS
25.0000 mg | ORAL_CAPSULE | Freq: Once | ORAL | Status: AC
  Administered 2016-11-29: 25 mg via ORAL
  Filled 2016-11-29: qty 2

## 2016-11-29 MED ORDER — VERAPAMIL HCL ER 120 MG PO TBCR: 120.0000 mg | EXTENDED_RELEASE_TABLET | Freq: Every day | ORAL | 0 refills | Status: DC

## 2016-11-29 NOTE — Discharge Instructions (Signed)
Your kidney function is slightly worse versus your most recent reading. You have been given a lower dose of your diuretic. Follow-up with primary care for recheck of this within the next 30 days. All of your included prescriptions are available at [facility] for $4 per month

## 2016-11-29 NOTE — ED Triage Notes (Signed)
Pt from home with complaints of headache and hypertension. Pt states she ran out of there HTN medications and she lost her job so she is having a hard time getting them refilled. Pt states she ran out on Thursday.

## 2016-11-29 NOTE — ED Provider Notes (Signed)
[place] DEPT Provider Note   CSN: [id number] Arrival date & time: 11/29/16  1606     History   Chief Complaint Chief Complaint  Patient presents with  . Hypertension  . Medication Refill    HPI Karen Ochoa is a 51 y.o. female. Chief complaint is high blood pressure, needs medications  HPI: This is a 51 year old female history of hypertension on multiple medications. Is been out for several weeks because she does not have a physician follow-up appointment. Has been on same medications for 3 years. Denies chest pain, stroke symptoms, numbness weakness, swelling, shortness of breath.  Past Medical History:  Diagnosis Date  . Fibroids   . Hx MRSA infection   . Hx-TIA (transient ischemic attack)    permanent insomnia  . Hypertension   . Insomnia due to medical condition     Patient Active Problem List   Diagnosis Date Noted  . SMOKER 06/18/2009  . SYNCOPE 06/18/2009  . INSOMNIA, CHRONIC 05/27/2009  . HYPERTENSION 05/27/2009  . STROKE 05/27/2009  . HEADACHE, CHRONIC, HX OF 05/27/2009    Past Surgical History:  Procedure Laterality Date  . WISDOM TOOTH EXTRACTION      OB History    Gravida Para Term Preterm AB Living   4 0 0 0 4 0   SAB TAB Ectopic Multiple Live Births   4 0 0 0         Home Medications    Prior to Admission medications   Medication Sig Start Date End Date Taking? Authorizing Provider  diphenhydramine-acetaminophen (TYLENOL PM) 25-500 MG TABS tablet Take 2 tablets by mouth at bedtime as needed (sleep).   Yes [provider]  triamterene-hydrochlorothiazide (MAXZIDE-25) 37.5-25 MG tablet Take 1 tablet by mouth daily.  05/08/16  Yes [provider]  hydrochlorothiazide (HYDRODIURIL) 12.5 MG tablet Take 2 tablets (25 mg total) by mouth daily. 11/29/16   Tanna Furry, MD  metoprolol tartrate (LOPRESSOR) 25 MG tablet Take 1 tablet (25 mg total) by mouth 2 (two) times daily. 11/29/16   Tanna Furry, MD  verapamil (CALAN-SR)  120 MG CR tablet Take 1 tablet (120 mg total) by mouth at bedtime. 11/29/16   Tanna Furry, MD    Family History Family History  Problem Relation Age of Onset  . Hypertension Mother   . Diabetes Sister   . Asthma Brother   . Diabetes Brother   . Anesthesia problems Neg Hx     Social History Social History  Substance Use Topics  . Smoking status: Current Every Day Smoker    Packs/day: 1.00    Types: Cigarettes  . Smokeless tobacco: Never Used  . Alcohol use Yes     Comment: weekends     Allergies   Penicillins; Sulfonamide derivatives; Clonidine derivatives; Flagyl [metronidazole]; and Labetalol   Review of Systems Review of Systems  Constitutional: Negative for appetite change, chills, diaphoresis, fatigue and fever.  HENT: Negative for mouth sores, sore throat and trouble swallowing.   Eyes: Negative for visual disturbance.  Respiratory: Negative for cough, chest tightness, shortness of breath and wheezing.   Cardiovascular: Negative for chest pain.  Gastrointestinal: Negative for abdominal distention, abdominal pain, diarrhea, nausea and vomiting.  Endocrine: Negative for polydipsia, polyphagia and polyuria.  Genitourinary: Negative for dysuria, frequency and hematuria.  Musculoskeletal: Negative for gait problem.  Skin: Negative for color change, pallor and rash.  Neurological: Negative for dizziness, syncope, light-headedness and headaches.  Hematological: Does not bruise/bleed easily.  Psychiatric/Behavioral: Negative for behavioral problems  and confusion.     Physical Exam Updated Vital Signs BP (!) 162/118 (BP Location: Left Arm)   Pulse (!) 104   Temp 98 F (36.7 C) (Oral)   Resp 18   Ht 5\' 7"  (1.702 m)   Wt 165 lb (74.8 kg)   SpO2 99%   BMI 25.84 kg/m   Physical Exam  Constitutional: She is oriented to person, place, and time. She appears well-developed and well-nourished. No distress.  HENT:  Head: Normocephalic.  Eyes: Conjunctivae are normal.  Pupils are equal, round, and reactive to light. No scleral icterus.  Neck: Normal range of motion. Neck supple. No thyromegaly present.  Cardiovascular: Normal rate and regular rhythm.  Exam reveals no gallop and no friction rub.   No murmur heard. Pulmonary/Chest: Effort normal and breath sounds normal. No respiratory distress. She has no wheezes. She has no rales.  Abdominal: Soft. Bowel sounds are normal. She exhibits no distension. There is no tenderness. There is no rebound.  Musculoskeletal: Normal range of motion.  Neurological: She is alert and oriented to person, place, and time.  Skin: Skin is warm and dry. No rash noted.  Psychiatric: She has a normal mood and affect. Her behavior is normal.     ED Treatments / Results  Labs (all labs ordered are listed, but only abnormal results are displayed) Labs Reviewed  I-STAT CHEM 8, ED - Abnormal; Notable for the following:       Result Value   Potassium 3.2 (*)    BUN 25 (*)    Creatinine, Ser 2.10 (*)    Glucose, Bld 108 (*)    Calcium, Ion 1.02 (*)    All other components within normal limits    EKG  EKG Interpretation None       Radiology No results found.  Procedures Procedures (including critical care time)  Medications Ordered in ED Medications  metoprolol tartrate (LOPRESSOR) tablet 25 mg (25 mg Oral Given 11/29/16 1751)  hydrochlorothiazide (MICROZIDE) capsule 25 mg (25 mg Oral Given 11/29/16 1750)  verapamil (CALAN-SR) CR tablet 120 mg (120 mg Oral Given 11/29/16 1751)     Initial Impression / Assessment and Plan / ED Course  I have reviewed the triage vital signs and the nursing notes.  Pertinent labs & imaging results that were available during my care of the patient were reviewed by me and considered in my medical decision making (see chart for details).     Sinus rhythm. No clinical signs of congestive heart failure. No vision changes. Normal retinal exam. No peripheral edema. Blood pressure  improves to 148/98 after 1 dose of her by mouth meds. Creatinine is 2.1 which is above her baseline. This was discussed with her. Decrease her hydrochlorothiazide dosage. Continue her verapamil and walk. Bastard to seek primary care for reevaluation. Was given a copy of her labs to take with her. I did adjust the frequency of her medications to allow for medications clearly on the $4 pharmacy list so she can afford them.  Final Clinical Impressions(s) / ED Diagnoses   Final diagnoses:  Hypertension, unspecified type  Azotemia    New Prescriptions Discharge Medication List as of 11/29/2016  6:57 PM    START taking these medications   Details  verapamil (CALAN-SR) 120 MG CR tablet Take 1 tablet (120 mg total) by mouth at bedtime., Starting Sun 11/29/2016, Print         Tanna Furry, MD 11/29/16 2325

## 2017-01-21 ENCOUNTER — Emergency Department (HOSPITAL_COMMUNITY)
Admission: EM | Admit: 2017-01-21 | Discharge: 2017-01-21 | Disposition: A | Discharge status: Home / Self Care | Payer: BLUE CROSS/BLUE SHIELD | Attending: Emergency Medicine | Admitting: Emergency Medicine

## 2017-01-21 ENCOUNTER — Encounter (HOSPITAL_COMMUNITY): Payer: Self-pay | Admitting: Emergency Medicine

## 2017-01-21 DIAGNOSIS — I10 Essential (primary) hypertension: Secondary | ICD-10-CM | POA: Insufficient documentation

## 2017-01-21 DIAGNOSIS — F17210 Nicotine dependence, cigarettes, uncomplicated: Secondary | ICD-10-CM | POA: Insufficient documentation

## 2017-01-21 DIAGNOSIS — Z9119 Patient's noncompliance with other medical treatment and regimen: Secondary | ICD-10-CM | POA: Insufficient documentation

## 2017-01-21 DIAGNOSIS — Z91199 Patient's noncompliance with other medical treatment and regimen due to unspecified reason: Secondary | ICD-10-CM

## 2017-01-21 DIAGNOSIS — Z79899 Other long term (current) drug therapy: Secondary | ICD-10-CM | POA: Insufficient documentation

## 2017-01-21 LAB — CBC
HEMATOCRIT: 42 % (ref 36.0–46.0)
HEMOGLOBIN: 14.7 g/dL (ref 12.0–15.0)
MCH: 30.8 pg (ref 26.0–34.0)
MCHC: 35 g/dL (ref 30.0–36.0)
MCV: 88.1 fL (ref 78.0–100.0)
Platelets: 217 10*3/uL (ref 150–400)
RBC: 4.77 MIL/uL (ref 3.87–5.11)
RDW: 14.3 % (ref 11.5–15.5)
WBC: 5.8 10*3/uL (ref 4.0–10.5)

## 2017-01-21 LAB — BASIC METABOLIC PANEL
ANION GAP: 11 (ref 5–15)
BUN: UNDETERMINED mg/dL (ref 6–20)
CO2: 23 mmol/L (ref 22–32)
Calcium: 9.3 mg/dL (ref 8.9–10.3)
Chloride: 106 mmol/L (ref 101–111)
Creatinine, Ser: 1.15 mg/dL — ABNORMAL HIGH (ref 0.44–1.00)
GFR calc Af Amer: >60 mL/min (ref >60)
GFR, EST NON AFRICAN AMERICAN: 53 mL/min — AB (ref >60)
Glucose, Bld: 103 mg/dL — ABNORMAL HIGH (ref 65–99)
POTASSIUM: 3.3 mmol/L — AB (ref 3.5–5.1)
SODIUM: 140 mmol/L (ref 135–145)

## 2017-01-21 MED ORDER — VERAPAMIL HCL 40 MG PO TABS: 40.0000 mg | ORAL_TABLET | Freq: Two times a day (BID) | ORAL | 0 refills | Status: DC

## 2017-01-21 MED ORDER — HYDROCHLOROTHIAZIDE 12.5 MG PO TABS: 12.5000 mg | ORAL_TABLET | Freq: Two times a day (BID) | ORAL | 0 refills | Status: DC

## 2017-01-21 MED ORDER — VERAPAMIL HCL 40 MG PO TABS
40.0000 mg | ORAL_TABLET | Freq: Once | ORAL | Status: AC
  Administered 2017-01-21: 40 mg via ORAL
  Filled 2017-01-21: qty 1

## 2017-01-21 MED ORDER — METOPROLOL TARTRATE 25 MG PO TABS: 25.0000 mg | ORAL_TABLET | Freq: Two times a day (BID) | ORAL | 0 refills | Status: DC

## 2017-01-21 NOTE — Discharge Instructions (Signed)
IT IS VERY IMPORTANT FOR YOU TO SCHEDULE AN APPOINTMENT WITH A PRIMARY CARE PROVIDER. YOU CAN CONTACT THE [facility] TO FOLLOW UP. TAKE MEDICATIONS AS PRESCRIBED.

## 2017-01-21 NOTE — ED Triage Notes (Signed)
Patient c/o feeling like her blood pressure is high and having swollen feet yesterday. States the swelling has gone down today. C/o slight headache to left side of temporal. Denies any shortness of breath or chest pain.

## 2017-01-21 NOTE — ED Provider Notes (Signed)
[facility] DEPT Provider Note   CSN: [id number] Arrival date & time: 01/21/17  1304     History   Chief Complaint Chief Complaint  Patient presents with  . Hypertension  . Foot Swelling    HPI Karen Ochoa is a 51 y.o. female.  51 year old female with past medical history including hypertension, TIA, fibroids who presents with hypertension and headache. Patient states that she feels like her blood pressure has been up recently and she ran out of verapamil yesterday. She has been taking her other medications but also states that she is getting low on these medicines as well. She reports mild left temporal headache that feels similar to previous headaches she has had related to her blood pressure. No extremity numbness or weakness. No chest pain or shortness of breath. She notes that her feet were swollen yesterday but this swelling seems to be improved today. She is on her feet all day at work. She does not follow up with the clinic and usually gets her medication refills from ERs.   The history is provided by the patient.  Hypertension     Past Medical History:  Diagnosis Date  . Fibroids   . Hx MRSA infection   . Hx-TIA (transient ischemic attack)    permanent insomnia  . Hypertension   . Insomnia due to medical condition     Patient Active Problem List   Diagnosis Date Noted  . SMOKER 06/18/2009  . SYNCOPE 06/18/2009  . INSOMNIA, CHRONIC 05/27/2009  . HYPERTENSION 05/27/2009  . STROKE 05/27/2009  . HEADACHE, CHRONIC, HX OF 05/27/2009    Past Surgical History:  Procedure Laterality Date  . WISDOM TOOTH EXTRACTION      OB History    Gravida Para Term Preterm AB Living   4 0 0 0 4 0   SAB TAB Ectopic Multiple Live Births   4 0 0 0         Home Medications    Prior to Admission medications   Medication Sig Start Date End Date Taking? Authorizing Provider  diphenhydramine-acetaminophen (TYLENOL PM) 25-500 MG TABS tablet Take 2 tablets by mouth at  bedtime as needed (sleep).   Yes [provider]  hydrochlorothiazide (HYDRODIURIL) 12.5 MG tablet Take 1 tablet (12.5 mg total) by mouth 2 (two) times daily. 01/21/17   Chessie Neuharth, Wenda Overland, MD  metoprolol tartrate (LOPRESSOR) 25 MG tablet Take 1 tablet (25 mg total) by mouth 2 (two) times daily. 01/21/17   Phillp Dolores, Wenda Overland, MD  verapamil (CALAN) 40 MG tablet Take 1 tablet (40 mg total) by mouth 2 (two) times daily. 01/21/17   Bhavesh Vazquez, Wenda Overland, MD    Family History Family History  Problem Relation Age of Onset  . Hypertension Mother   . Diabetes Sister   . Asthma Brother   . Diabetes Brother   . Anesthesia problems Neg Hx     Social History Social History  Substance Use Topics  . Smoking status: Current Every Day Smoker    Packs/day: 1.00    Types: Cigarettes  . Smokeless tobacco: Never Used  . Alcohol use Yes     Comment: weekends     Allergies   Penicillins; Sulfonamide derivatives; Clonidine derivatives; Flagyl [metronidazole]; and Labetalol   Review of Systems Review of Systems All other systems reviewed and are negative except that which was mentioned in HPI   Physical Exam Updated Vital Signs BP (!) 194/111   Pulse 76   Temp 98.4 F (36.9 C) (  Oral)   Resp 13   Ht 5\' 7"  (1.702 m)   Wt 72.6 kg (160 lb)   LMP 05/19/2013   SpO2 100%   BMI 25.06 kg/m   Physical Exam  Constitutional: She is oriented to person, place, and time. She appears well-developed and well-nourished. No distress.  Awake, alert  HENT:  Head: Normocephalic and atraumatic.  Eyes: Conjunctivae and EOM are normal. Pupils are equal, round, and reactive to light.  Neck: Neck supple.  Cardiovascular: Normal rate, regular rhythm and normal heart sounds.   No murmur heard. Pulmonary/Chest: Effort normal and breath sounds normal. No respiratory distress.  Abdominal: Soft. Bowel sounds are normal. She exhibits no distension. There is no tenderness.  Musculoskeletal: She exhibits  no edema.  Neurological: She is alert and oriented to person, place, and time. She has normal reflexes. No cranial nerve deficit. She exhibits normal muscle tone.  Fluent speech 5/5 strength and normal sensation x all 4 extremities  Skin: Skin is warm and dry.  Psychiatric: She has a normal mood and affect. Judgment and thought content normal.  Nursing note and vitals reviewed.    ED Treatments / Results  Labs (all labs ordered are listed, but only abnormal results are displayed) Labs Reviewed  BASIC METABOLIC PANEL - Abnormal; Notable for the following:       Result Value   Potassium 3.3 (*)    Glucose, Bld 103 (*)    Creatinine, Ser 1.15 (*)    GFR calc non Af Amer 53 (*)    All other components within normal limits  CBC    EKG  EKG Interpretation  Date/Time:  Thursday January 21 2017 16:58:54 EDT Ventricular Rate:  73 PR Interval:    QRS Duration: 74 QT Interval:  446 QTC Calculation: 492 R Axis:   40 Text Interpretation:  Sinus rhythm Borderline prolonged PR interval Probable left atrial enlargement RSR' in V1 or V2, probably normal variant Left ventricular hypertrophy Borderline prolonged QT interval No significant change since last tracing Confirmed by Theotis Burrow [phone]) on 01/21/2017 6:34:47 PM       Radiology No results found.  Procedures Procedures (including critical care time)  Medications Ordered in ED Medications  verapamil (CALAN) tablet 40 mg (40 mg Oral Given 01/21/17 1657)     Initial Impression / Assessment and Plan / ED Course  I have reviewed the triage vital signs and the nursing notes.  Pertinent labs that were available during my care of the patient were reviewed by me and considered in my medical decision making (see chart for details).    Pt w/ h/o HTN and frequent ED visits for med refills p/w HTN, mild headache, and request for medications. BP 191/111. Neurologically intact w/ reassuring exam. She states this headache is similar to many  previous headaches related to BP, I doubt bleed or other acute intracranial process. She did have a bump in her creatinine the last time she was here, up to 2.1. I checked her creatinine again today due to concern for possible worsening renal failure. Cr. 1.15 today. EKG without ischemic changes. Gave a dose of verapamil. I extensively counseled the patient at the ER isn't an appropriate place for chronic medication refills and I am very concerned about her poorly managed blood pressure as she does not have a PCP following her progress over time. I emphasized the importance of establishing care and provided with community health and wellness information.  Final Clinical Impressions(s) / ED Diagnoses  Final diagnoses:  Poorly-controlled hypertension  Medical non-compliance    New Prescriptions Discharge Medication List as of 01/21/2017  6:40 PM       Burdette Forehand, Wenda Overland, MD 01/22/17 [phone]

## 2017-01-21 NOTE — ED Notes (Signed)
Spoke with Gwenyth Bouillon MD per patient complaints. No orders placed on patient until MD evaluation. Just requesting manual blood pressure.

## 2017-01-22 NOTE — Care Management (Signed)
Received call from [place] [phone].  [facility] pharm tech requesting change in medication dose and type due to better cost for patient.  Request change Verapamil to 80 mg 1 x a day ($4.00) vs 40 mg bid at ($15.69) and HCTZ 12.5 mg to capsules from tabs.  Spoke to [employer] PA  as prescribing physician not on duty today.  Approved change of HCTZ to capsules at same dose, felt changing dose and frequency of Verapamil would not provide effective control of patient's blood pressure.  Returned call to spoke to Pharmacist Garen Grams) informed of approval of change to HCTZ but not to dose and frequency of Verapamil explained no additional calls would be made regarding the order so Verapamil script should be filled as written.  Rayburn Ma RN, BSN, CM

## 2017-07-18 ENCOUNTER — Inpatient Hospital Stay (HOSPITAL_COMMUNITY)
Admission: EM | Admit: 2017-07-18 | Discharge: 2017-07-21 | DRG: 064 | Disposition: A | Discharge status: Home / Self Care | Payer: Medicaid Other | Attending: Internal Medicine | Admitting: Internal Medicine

## 2017-07-18 ENCOUNTER — Encounter (HOSPITAL_COMMUNITY): Payer: Self-pay | Admitting: *Deleted

## 2017-07-18 ENCOUNTER — Emergency Department (HOSPITAL_COMMUNITY): Payer: Medicaid Other

## 2017-07-18 ENCOUNTER — Other Ambulatory Visit: Payer: Self-pay

## 2017-07-18 DIAGNOSIS — I6300 Cerebral infarction due to thrombosis of unspecified precerebral artery: Secondary | ICD-10-CM

## 2017-07-18 DIAGNOSIS — I5022 Chronic systolic (congestive) heart failure: Secondary | ICD-10-CM

## 2017-07-18 DIAGNOSIS — I130 Hypertensive heart and chronic kidney disease with heart failure and stage 1 through stage 4 chronic kidney disease, or unspecified chronic kidney disease: Secondary | ICD-10-CM | POA: Diagnosis present

## 2017-07-18 DIAGNOSIS — E785 Hyperlipidemia, unspecified: Secondary | ICD-10-CM

## 2017-07-18 DIAGNOSIS — Z888 Allergy status to other drugs, medicaments and biological substances status: Secondary | ICD-10-CM

## 2017-07-18 DIAGNOSIS — I420 Dilated cardiomyopathy: Secondary | ICD-10-CM

## 2017-07-18 DIAGNOSIS — Z8249 Family history of ischemic heart disease and other diseases of the circulatory system: Secondary | ICD-10-CM

## 2017-07-18 DIAGNOSIS — Z880 Allergy status to penicillin: Secondary | ICD-10-CM

## 2017-07-18 DIAGNOSIS — Z79899 Other long term (current) drug therapy: Secondary | ICD-10-CM

## 2017-07-18 DIAGNOSIS — R519 Headache, unspecified: Secondary | ICD-10-CM

## 2017-07-18 DIAGNOSIS — E876 Hypokalemia: Secondary | ICD-10-CM | POA: Diagnosis present

## 2017-07-18 DIAGNOSIS — Z8673 Personal history of transient ischemic attack (TIA), and cerebral infarction without residual deficits: Secondary | ICD-10-CM

## 2017-07-18 DIAGNOSIS — N183 Chronic kidney disease, stage 3 unspecified: Secondary | ICD-10-CM | POA: Diagnosis present

## 2017-07-18 DIAGNOSIS — N289 Disorder of kidney and ureter, unspecified: Secondary | ICD-10-CM

## 2017-07-18 DIAGNOSIS — I779 Disorder of arteries and arterioles, unspecified: Secondary | ICD-10-CM | POA: Diagnosis present

## 2017-07-18 DIAGNOSIS — Z87898 Personal history of other specified conditions: Secondary | ICD-10-CM

## 2017-07-18 DIAGNOSIS — R9082 White matter disease, unspecified: Secondary | ICD-10-CM

## 2017-07-18 DIAGNOSIS — I672 Cerebral atherosclerosis: Secondary | ICD-10-CM | POA: Diagnosis present

## 2017-07-18 DIAGNOSIS — R29810 Facial weakness: Secondary | ICD-10-CM | POA: Diagnosis present

## 2017-07-18 DIAGNOSIS — Z716 Tobacco abuse counseling: Secondary | ICD-10-CM

## 2017-07-18 DIAGNOSIS — R51 Headache: Secondary | ICD-10-CM

## 2017-07-18 DIAGNOSIS — I639 Cerebral infarction, unspecified: Secondary | ICD-10-CM | POA: Diagnosis present

## 2017-07-18 DIAGNOSIS — I43 Cardiomyopathy in diseases classified elsewhere: Secondary | ICD-10-CM | POA: Diagnosis present

## 2017-07-18 DIAGNOSIS — G4701 Insomnia due to medical condition: Secondary | ICD-10-CM | POA: Diagnosis present

## 2017-07-18 DIAGNOSIS — Z825 Family history of asthma and other chronic lower respiratory diseases: Secondary | ICD-10-CM

## 2017-07-18 DIAGNOSIS — R29702 NIHSS score 2: Secondary | ICD-10-CM | POA: Diagnosis present

## 2017-07-18 DIAGNOSIS — Z8614 Personal history of Methicillin resistant Staphylococcus aureus infection: Secondary | ICD-10-CM

## 2017-07-18 DIAGNOSIS — E1122 Type 2 diabetes mellitus with diabetic chronic kidney disease: Secondary | ICD-10-CM | POA: Diagnosis present

## 2017-07-18 DIAGNOSIS — I429 Cardiomyopathy, unspecified: Secondary | ICD-10-CM

## 2017-07-18 DIAGNOSIS — I5023 Acute on chronic systolic (congestive) heart failure: Secondary | ICD-10-CM | POA: Diagnosis present

## 2017-07-18 DIAGNOSIS — I739 Peripheral vascular disease, unspecified: Secondary | ICD-10-CM

## 2017-07-18 DIAGNOSIS — Z9114 Patient's other noncompliance with medication regimen: Secondary | ICD-10-CM

## 2017-07-18 DIAGNOSIS — Z882 Allergy status to sulfonamides status: Secondary | ICD-10-CM

## 2017-07-18 DIAGNOSIS — I10 Essential (primary) hypertension: Secondary | ICD-10-CM | POA: Diagnosis present

## 2017-07-18 DIAGNOSIS — F17210 Nicotine dependence, cigarettes, uncomplicated: Secondary | ICD-10-CM | POA: Diagnosis present

## 2017-07-18 DIAGNOSIS — Z833 Family history of diabetes mellitus: Secondary | ICD-10-CM

## 2017-07-18 DIAGNOSIS — F17200 Nicotine dependence, unspecified, uncomplicated: Secondary | ICD-10-CM | POA: Diagnosis present

## 2017-07-18 HISTORY — DX: Unspecified asthma, uncomplicated: J45.909

## 2017-07-18 HISTORY — DX: Headache: R51

## 2017-07-18 HISTORY — DX: Transient cerebral ischemic attack, unspecified: G45.9

## 2017-07-18 HISTORY — DX: Headache, unspecified: R51.9

## 2017-07-18 HISTORY — DX: Cerebral infarction, unspecified: I63.9

## 2017-07-18 HISTORY — DX: Hyperlipidemia, unspecified: E78.5

## 2017-07-18 HISTORY — DX: Pure hypercholesterolemia, unspecified: E78.00

## 2017-07-18 HISTORY — DX: Gastro-esophageal reflux disease without esophagitis: K21.9

## 2017-07-18 LAB — CBC WITH DIFFERENTIAL/PLATELET
BASOS PCT: 1 %
Basophils Absolute: 0 10*3/uL (ref 0.0–0.1)
EOS ABS: 0.1 10*3/uL (ref 0.0–0.7)
EOS PCT: 1 %
HCT: 38.2 % (ref 36.0–46.0)
Hemoglobin: 12.8 g/dL (ref 12.0–15.0)
Lymphocytes Relative: 35 %
Lymphs Abs: 2.5 10*3/uL (ref 0.7–4.0)
MCH: 29.5 pg (ref 26.0–34.0)
MCHC: 33.5 g/dL (ref 30.0–36.0)
MCV: 88 fL (ref 78.0–100.0)
MONO ABS: 0.5 10*3/uL (ref 0.1–1.0)
MONOS PCT: 7 %
NEUTROS ABS: 4 10*3/uL (ref 1.7–7.7)
Neutrophils Relative %: 56 %
PLATELETS: 194 10*3/uL (ref 150–400)
RBC: 4.34 MIL/uL (ref 3.87–5.11)
RDW: 15.6 % — AB (ref 11.5–15.5)
WBC: 7.2 10*3/uL (ref 4.0–10.5)

## 2017-07-18 LAB — URINALYSIS, ROUTINE W REFLEX MICROSCOPIC
GLUCOSE, UA: NEGATIVE mg/dL
HGB URINE DIPSTICK: NEGATIVE
Ketones, ur: 5 mg/dL — AB
LEUKOCYTES UA: NEGATIVE
Nitrite: NEGATIVE
Protein, ur: 100 mg/dL — AB
SPECIFIC GRAVITY, URINE: 1.03 (ref 1.005–1.030)
pH: 5 (ref 5.0–8.0)

## 2017-07-18 LAB — COMPREHENSIVE METABOLIC PANEL
ALBUMIN: 3.8 g/dL (ref 3.5–5.0)
ALT: 13 U/L — ABNORMAL LOW (ref 14–54)
ANION GAP: 10 (ref 5–15)
AST: 34 U/L (ref 15–41)
Alkaline Phosphatase: 111 U/L (ref 38–126)
BILIRUBIN TOTAL: 0.9 mg/dL (ref 0.3–1.2)
BUN: 33 mg/dL — ABNORMAL HIGH (ref 6–20)
CO2: 25 mmol/L (ref 22–32)
Calcium: 9.2 mg/dL (ref 8.9–10.3)
Chloride: 106 mmol/L (ref 101–111)
Creatinine, Ser: 1.6 mg/dL — ABNORMAL HIGH (ref 0.44–1.00)
GFR calc Af Amer: 41 mL/min — ABNORMAL LOW (ref >60)
GFR calc non Af Amer: 36 mL/min — ABNORMAL LOW (ref >60)
GLUCOSE: 93 mg/dL (ref 65–99)
POTASSIUM: 3.6 mmol/L (ref 3.5–5.1)
Sodium: 141 mmol/L (ref 135–145)
TOTAL PROTEIN: 6.9 g/dL (ref 6.5–8.1)

## 2017-07-18 MED ORDER — HYDROCHLOROTHIAZIDE 12.5 MG PO CAPS
12.5000 mg | ORAL_CAPSULE | Freq: Two times a day (BID) | ORAL | Status: DC
  Administered 2017-07-18 – 2017-07-21 (×6): 12.5 mg via ORAL
  Filled 2017-07-18 (×6): qty 1

## 2017-07-18 MED ORDER — METOPROLOL TARTRATE 25 MG PO TABS
25.0000 mg | ORAL_TABLET | Freq: Two times a day (BID) | ORAL | Status: DC
  Administered 2017-07-18 – 2017-07-20 (×4): 25 mg via ORAL
  Filled 2017-07-18 (×4): qty 1

## 2017-07-18 MED ORDER — GADOBENATE DIMEGLUMINE 529 MG/ML IV SOLN
20.0000 mL | Freq: Once | INTRAVENOUS | Status: AC | PRN
  Administered 2017-07-18: 16 mL via INTRAVENOUS

## 2017-07-18 MED ORDER — ACETAMINOPHEN 325 MG PO TABS
650.0000 mg | ORAL_TABLET | Freq: Once | ORAL | Status: AC
  Administered 2017-07-18: 650 mg via ORAL
  Filled 2017-07-18: qty 2

## 2017-07-18 MED ORDER — LORAZEPAM 1 MG PO TABS
1.0000 mg | ORAL_TABLET | Freq: Once | ORAL | Status: AC
  Administered 2017-07-18: 1 mg via ORAL
  Filled 2017-07-18: qty 1

## 2017-07-18 NOTE — ED Triage Notes (Signed)
Pt noncompliant with bp meds, states on Wednesday the left side of her body went numb, has not taken her bp meds in over a month. Pt does not have any weakness in arms with testes for strength and lifting. Pt is hypertensive and has history

## 2017-07-18 NOTE — ED Provider Notes (Signed)
[facility] DEPT Provider Note   CSN: [id number] Arrival date & time: 07/18/17  1404     History   Chief Complaint Chief Complaint  Patient presents with  . Hypertension  . Numbness    HPI Karen Ochoa is a 51 y.o. female.  The history is provided by the patient. No language interpreter was used.  Cerebrovascular Accident  This is a new problem. Episode onset: 5 days. The problem occurs constantly. The problem has not changed since onset.Nothing aggravates the symptoms. Nothing relieves the symptoms. She has tried nothing for the symptoms.  Pt complains of weakness and numbness on her left side since Wednesday.  Pt reports she is worried about her blood pressure.  Pt reports she has not been able to get her medications.  Pt reports her face feels tingly.  Pt reports she feels like she gets tangled up on words.  Pt reports she is able to walk her left leg feels weak but if she takes baby steps she can walk.    Past Medical History:  Diagnosis Date  . Fibroids   . Hx MRSA infection   . Hx-TIA (transient ischemic attack)    permanent insomnia  . Hypertension   . Insomnia due to medical condition     Patient Active Problem List   Diagnosis Date Noted  . SMOKER 06/18/2009  . SYNCOPE 06/18/2009  . INSOMNIA, CHRONIC 05/27/2009  . HYPERTENSION 05/27/2009  . STROKE 05/27/2009  . HEADACHE, CHRONIC, HX OF 05/27/2009    Past Surgical History:  Procedure Laterality Date  . WISDOM TOOTH EXTRACTION      OB History    Gravida Para Term Preterm AB Living   4 0 0 0 4 0   SAB TAB Ectopic Multiple Live Births   4 0 0 0         Home Medications    Prior to Admission medications   Medication Sig Start Date End Date Taking? Authorizing Provider  diphenhydramine-acetaminophen (TYLENOL PM) 25-500 MG TABS tablet Take 2 tablets by mouth at bedtime as needed (sleep).   Yes [provider]  hydrochlorothiazide (HYDRODIURIL) 12.5 MG tablet  Take 1 tablet (12.5 mg total) by mouth 2 (two) times daily. 01/21/17  Yes Little, Wenda Overland, MD  metoprolol tartrate (LOPRESSOR) 25 MG tablet Take 1 tablet (25 mg total) by mouth 2 (two) times daily. 01/21/17  Yes Little, Wenda Overland, MD  verapamil (CALAN) 40 MG tablet Take 1 tablet (40 mg total) by mouth 2 (two) times daily. 01/21/17  Yes Little, Wenda Overland, MD    Family History Family History  Problem Relation Age of Onset  . Hypertension Mother   . Diabetes Sister   . Asthma Brother   . Diabetes Brother   . Anesthesia problems Neg Hx     Social History Social History   Tobacco Use  . Smoking status: Current Every Day Smoker    Packs/day: 1.00    Types: Cigarettes  . Smokeless tobacco: Never Used  Substance Use Topics  . Alcohol use: Yes    Comment: weekends  . Drug use: No     Allergies   Penicillins; Sulfonamide derivatives; Clonidine derivatives; Flagyl [metronidazole]; and Labetalol   Review of Systems Review of Systems  Neurological: Positive for speech difficulty, weakness and numbness.  All other systems reviewed and are negative.    Physical Exam Updated Vital Signs BP (!) 196/127 (BP Location: Right Arm)   Pulse 92   Temp 98  F (36.7 C) (Oral)   Resp 14   Ht 5\' 7"  (1.702 m)   Wt 74.8 kg (165 lb)   LMP 05/19/2013   SpO2 95%   BMI 25.84 kg/m   Physical Exam  Constitutional: She appears well-developed and well-nourished. No distress.  HENT:  Head: Normocephalic and atraumatic.  Right Ear: External ear normal.  Left Ear: External ear normal.  Nose: Nose normal.  Mouth/Throat: Oropharynx is clear and moist.  Eyes: Conjunctivae and EOM are normal. Pupils are equal, round, and reactive to light.  Neck: Neck supple.  Cardiovascular: Normal rate and regular rhythm.  No murmur heard. Pulmonary/Chest: Effort normal and breath sounds normal. No respiratory distress.  Abdominal: Soft. There is no tenderness.  Musculoskeletal: She exhibits no  edema.  Neurological: She is alert.  Skin: Skin is warm and dry.  Psychiatric: She has a normal mood and affect.  Nursing note and vitals reviewed.    ED Treatments / Results  Labs (all labs ordered are listed, but only abnormal results are displayed) Labs Reviewed  CBC WITH DIFFERENTIAL/PLATELET - Abnormal; Notable for the following components:      Result Value   RDW 15.6 (*)    All other components within normal limits  COMPREHENSIVE METABOLIC PANEL - Abnormal; Notable for the following components:   BUN 33 (*)    Creatinine, Ser 1.60 (*)    ALT 13 (*)    GFR calc non Af Amer 36 (*)    GFR calc Af Amer 41 (*)    All other components within normal limits  URINALYSIS, ROUTINE W REFLEX MICROSCOPIC - Abnormal; Notable for the following components:   Color, Urine AMBER (*)    APPearance HAZY (*)    Bilirubin Urine MODERATE (*)    Ketones, ur 5 (*)    Protein, ur 100 (*)    Bacteria, UA RARE (*)    Squamous Epithelial / LPF 6-30 (*)    All other components within normal limits    EKG  EKG Interpretation None       Radiology Ct Head Wo Contrast  Result Date: 07/18/2017 CLINICAL DATA:  High blood pressure. Focal neuro deficits, suspect stroke EXAM: CT HEAD WITHOUT CONTRAST TECHNIQUE: Contiguous axial images were obtained from the base of the skull through the vertex without intravenous contrast. COMPARISON:  May 10, 2016 FINDINGS: Brain: No evidence of acute infarction, hemorrhage, hydrocephalus, extra-axial collection or mass lesion/mass effect. There is chronic diffuse atrophy. Chronic bilateral periventricular white matter small vessel ischemic changes identified. 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 Vascular: No hyperdense vessel or unexpected calcification. Skull: Normal. Negative for fracture or focal lesion. Sinuses/Orbits: No acute finding. Other: None. IMPRESSION: No focal acute intracranial abnormality identified. Chronic diffuse atrophy.  Chronic bilateral periventricular white matter small vessel ischemic change. Electronically Signed   By: Abelardo Diesel M.D.   On: 07/18/2017 18:01   Mr Brain W And Wo Contrast  Result Date: 07/18/2017 CLINICAL DATA:  51 y/o  F; left-sided numbness. EXAM: MRI HEAD WITHOUT AND WITH CONTRAST TECHNIQUE: Multiplanar, multiecho pulse sequences of the brain and surrounding structures were obtained without and with intravenous contrast. CONTRAST:  56mL MULTIHANCE GADOBENATE DIMEGLUMINE 529 MG/ML IV SOLN COMPARISON:  07/18/2017 CT head.  04/23/2006 MRI head. FINDINGS: Brain: Right posterior paramedian pontine subcentimeter focus of reduced diffusion (series 4, image 13). Numerous foci of T2 FLAIR hyperintense signal abnormality are present throughout subcortical and periventricular white matter. There are lesions within the genu of  corpus callosum, patchy foci throughout the pons, bilateral thalamus, brachium pontis, and cerebellar white matter. Cavum septum pellucidum. Mild brain parenchymal volume loss. No abnormal enhancement of the brain. No susceptibility hypointensity to suggest intracranial hemorrhage. Vascular: Normal flow voids. Skull and upper cervical spine: Normal marrow signal. Sinuses/Orbits: Sphenoid sinus mucosal thickening. Trace right mastoid effusion. Otherwise negative. Other: None. IMPRESSION: Right posterior paramedian pontine focus of reduced diffusion and progressed age advanced white matter lesions. Findings probably represent an acute/early subacute pontine infarction and chronic microvascular ischemic changes of white matter, particularly in the setting of hypertension or diabetes. Given lesions in corpus callosum and cerebellum, a demyelinating process such as MS should also be considered. Electronically Signed   By: Kristine Garbe M.D.   On: 07/18/2017 22:13    Procedures Procedures (including critical care time)  Medications Ordered in ED Medications  hydrochlorothiazide  (MICROZIDE) capsule 12.5 mg (12.5 mg Oral Given 07/18/17 1950)  metoprolol tartrate (LOPRESSOR) tablet 25 mg (25 mg Oral Given 07/18/17 1951)  acetaminophen (TYLENOL) tablet 650 mg (650 mg Oral Given 07/18/17 1951)  LORazepam (ATIVAN) tablet 1 mg (1 mg Oral Given 07/18/17 2019)  gadobenate dimeglumine (MULTIHANCE) injection 20 mL (16 mLs Intravenous Contrast Given 07/18/17 2151)     Initial Impression / Assessment and Plan / ED Course  I have reviewed the triage vital signs and the nursing notes.  Pertinent labs & imaging results that were available during my care of the patient were reviewed by me and considered in my medical decision making (see chart for details).     I spoke to Dr. Leonel Ramsay Neurology.  Pt to be admitted to [facility] hospitalist and neurology will follow.   Consult to Hospitalist  Pt reevaluated.  Pt reports her headache has improved with tylenol.   Final Clinical Impressions(s) / ED Diagnoses   Final diagnoses:  Cerebrovascular accident (CVA), unspecified mechanism ([place])  Hypertension, unspecified type  Nonintractable headache, unspecified chronicity pattern, unspecified headache type    ED Discharge Orders    None        Fransico Meadow, [place] 07/18/17 2247    Malvin Johns, MD 07/18/17 7106    Malvin Johns, MD 07/18/17 [phone]

## 2017-07-19 ENCOUNTER — Inpatient Hospital Stay (HOSPITAL_COMMUNITY): Payer: BLUE CROSS/BLUE SHIELD

## 2017-07-19 ENCOUNTER — Inpatient Hospital Stay (HOSPITAL_COMMUNITY)
Admission: EM | Admit: 2017-07-19 | Discharge: 2017-07-19 | Disposition: A | Discharge status: Home / Self Care | Payer: Medicaid Other | Source: Home / Self Care | Attending: Internal Medicine | Admitting: Internal Medicine

## 2017-07-19 ENCOUNTER — Inpatient Hospital Stay (HOSPITAL_COMMUNITY): Payer: Medicaid Other

## 2017-07-19 DIAGNOSIS — I672 Cerebral atherosclerosis: Secondary | ICD-10-CM | POA: Diagnosis present

## 2017-07-19 DIAGNOSIS — I639 Cerebral infarction, unspecified: Secondary | ICD-10-CM | POA: Diagnosis present

## 2017-07-19 DIAGNOSIS — Z716 Tobacco abuse counseling: Secondary | ICD-10-CM | POA: Diagnosis not present

## 2017-07-19 DIAGNOSIS — I10 Essential (primary) hypertension: Secondary | ICD-10-CM

## 2017-07-19 DIAGNOSIS — Z825 Family history of asthma and other chronic lower respiratory diseases: Secondary | ICD-10-CM | POA: Diagnosis not present

## 2017-07-19 DIAGNOSIS — Z888 Allergy status to other drugs, medicaments and biological substances status: Secondary | ICD-10-CM | POA: Diagnosis not present

## 2017-07-19 DIAGNOSIS — F17210 Nicotine dependence, cigarettes, uncomplicated: Secondary | ICD-10-CM | POA: Diagnosis present

## 2017-07-19 DIAGNOSIS — Z79899 Other long term (current) drug therapy: Secondary | ICD-10-CM | POA: Diagnosis not present

## 2017-07-19 DIAGNOSIS — Z9114 Patient's other noncompliance with medication regimen: Secondary | ICD-10-CM | POA: Diagnosis not present

## 2017-07-19 DIAGNOSIS — G4701 Insomnia due to medical condition: Secondary | ICD-10-CM | POA: Diagnosis present

## 2017-07-19 DIAGNOSIS — E876 Hypokalemia: Secondary | ICD-10-CM | POA: Diagnosis present

## 2017-07-19 DIAGNOSIS — I43 Cardiomyopathy in diseases classified elsewhere: Secondary | ICD-10-CM | POA: Diagnosis present

## 2017-07-19 DIAGNOSIS — Z8673 Personal history of transient ischemic attack (TIA), and cerebral infarction without residual deficits: Secondary | ICD-10-CM | POA: Diagnosis not present

## 2017-07-19 DIAGNOSIS — Z880 Allergy status to penicillin: Secondary | ICD-10-CM | POA: Diagnosis not present

## 2017-07-19 DIAGNOSIS — I5023 Acute on chronic systolic (congestive) heart failure: Secondary | ICD-10-CM | POA: Diagnosis present

## 2017-07-19 DIAGNOSIS — Z882 Allergy status to sulfonamides status: Secondary | ICD-10-CM | POA: Diagnosis not present

## 2017-07-19 DIAGNOSIS — N289 Disorder of kidney and ureter, unspecified: Secondary | ICD-10-CM

## 2017-07-19 DIAGNOSIS — R29810 Facial weakness: Secondary | ICD-10-CM | POA: Diagnosis present

## 2017-07-19 DIAGNOSIS — Z8614 Personal history of Methicillin resistant Staphylococcus aureus infection: Secondary | ICD-10-CM | POA: Diagnosis not present

## 2017-07-19 DIAGNOSIS — I130 Hypertensive heart and chronic kidney disease with heart failure and stage 1 through stage 4 chronic kidney disease, or unspecified chronic kidney disease: Secondary | ICD-10-CM | POA: Diagnosis present

## 2017-07-19 DIAGNOSIS — E1122 Type 2 diabetes mellitus with diabetic chronic kidney disease: Secondary | ICD-10-CM | POA: Diagnosis present

## 2017-07-19 DIAGNOSIS — Z8249 Family history of ischemic heart disease and other diseases of the circulatory system: Secondary | ICD-10-CM | POA: Diagnosis not present

## 2017-07-19 DIAGNOSIS — N183 Chronic kidney disease, stage 3 (moderate): Secondary | ICD-10-CM | POA: Diagnosis present

## 2017-07-19 DIAGNOSIS — R51 Headache: Secondary | ICD-10-CM

## 2017-07-19 DIAGNOSIS — R29702 NIHSS score 2: Secondary | ICD-10-CM | POA: Diagnosis present

## 2017-07-19 DIAGNOSIS — Z833 Family history of diabetes mellitus: Secondary | ICD-10-CM | POA: Diagnosis not present

## 2017-07-19 LAB — HEMOGLOBIN A1C
HEMOGLOBIN A1C: 5.9 % — AB (ref 4.8–5.6)
MEAN PLASMA GLUCOSE: 122.63 mg/dL

## 2017-07-19 LAB — PROTIME-INR
INR: 0.96
PROTHROMBIN TIME: 12.7 s (ref 11.4–15.2)

## 2017-07-19 LAB — LIPID PANEL
CHOLESTEROL: 187 mg/dL (ref 0–200)
HDL: 40 mg/dL — AB (ref >40)
LDL Cholesterol: 105 mg/dL — ABNORMAL HIGH (ref 0–99)
TRIGLYCERIDES: 210 mg/dL — AB (ref <150)
Total CHOL/HDL Ratio: 4.7 RATIO
VLDL: 42 mg/dL — ABNORMAL HIGH (ref 0–40)

## 2017-07-19 LAB — BASIC METABOLIC PANEL
ANION GAP: 9 (ref 5–15)
BUN: 33 mg/dL — AB (ref 6–20)
CHLORIDE: 107 mmol/L (ref 101–111)
CO2: 24 mmol/L (ref 22–32)
Calcium: 9 mg/dL (ref 8.9–10.3)
Creatinine, Ser: 1.45 mg/dL — ABNORMAL HIGH (ref 0.44–1.00)
GFR calc Af Amer: 47 mL/min — ABNORMAL LOW (ref >60)
GFR calc non Af Amer: 40 mL/min — ABNORMAL LOW (ref >60)
GLUCOSE: 90 mg/dL (ref 65–99)
POTASSIUM: 3.2 mmol/L — AB (ref 3.5–5.1)
Sodium: 140 mmol/L (ref 135–145)

## 2017-07-19 LAB — RAPID URINE DRUG SCREEN, HOSP PERFORMED
Amphetamines: NOT DETECTED
Barbiturates: NOT DETECTED
Benzodiazepines: NOT DETECTED
COCAINE: NOT DETECTED
OPIATES: NOT DETECTED
TETRAHYDROCANNABINOL: NOT DETECTED

## 2017-07-19 LAB — HIV ANTIBODY (ROUTINE TESTING W REFLEX): HIV SCREEN 4TH GENERATION: NONREACTIVE

## 2017-07-19 LAB — APTT: APTT: 29 s (ref 24–36)

## 2017-07-19 MED ORDER — ASPIRIN 325 MG PO TABS
325.0000 mg | ORAL_TABLET | Freq: Every day | ORAL | Status: DC
  Administered 2017-07-19 – 2017-07-21 (×3): 325 mg via ORAL
  Filled 2017-07-19 (×3): qty 1

## 2017-07-19 MED ORDER — VERAPAMIL HCL 40 MG PO TABS
40.0000 mg | ORAL_TABLET | Freq: Two times a day (BID) | ORAL | Status: DC
  Administered 2017-07-19 – 2017-07-20 (×4): 40 mg via ORAL
  Filled 2017-07-19 (×6): qty 1

## 2017-07-19 MED ORDER — ATORVASTATIN CALCIUM 40 MG PO TABS
40.0000 mg | ORAL_TABLET | Freq: Every day | ORAL | Status: DC
  Administered 2017-07-19 – 2017-07-20 (×2): 40 mg via ORAL
  Filled 2017-07-19 (×2): qty 1

## 2017-07-19 MED ORDER — ACETAMINOPHEN 160 MG/5ML PO SOLN: 650.0000 mg | ORAL | Status: DC | PRN

## 2017-07-19 MED ORDER — ASPIRIN 300 MG RE SUPP: 300.0000 mg | Freq: Every day | RECTAL | Status: DC

## 2017-07-19 MED ORDER — ENOXAPARIN SODIUM 40 MG/0.4ML ~~LOC~~ SOLN
40.0000 mg | Freq: Every day | SUBCUTANEOUS | Status: DC
  Administered 2017-07-19 – 2017-07-20 (×3): 40 mg via SUBCUTANEOUS
  Filled 2017-07-19 (×4): qty 0.4

## 2017-07-19 MED ORDER — ACETAMINOPHEN 650 MG RE SUPP: 650.0000 mg | RECTAL | Status: DC | PRN

## 2017-07-19 MED ORDER — ACETAMINOPHEN 325 MG PO TABS
650.0000 mg | ORAL_TABLET | ORAL | Status: DC | PRN
  Administered 2017-07-19: 650 mg via ORAL
  Filled 2017-07-19: qty 2

## 2017-07-19 MED ORDER — STROKE: EARLY STAGES OF RECOVERY BOOK
Freq: Once | Status: AC
  Administered 2017-07-20: 21:00:00
  Filled 2017-07-19: qty 1

## 2017-07-19 NOTE — H&P (Signed)
History and Physical    Karen Ochoa FAO:[id number] DOB: 12/16/1965 DOA: 07/18/2017  PCP: Patient, No Pcp Per  Patient coming from: Home  I have personally briefly reviewed patient's old medical records in [place]  Chief Complaint: Hypertension, numbness  HPI: Karen Ochoa is a 51 y.o. female with medical history significant of HTN, prior TIAs.  Patient presents to the ED with c/o high blood pressure and onset of LUE and LLE numbness and weakness.  This onset on Wed.  Persistent since onset.  BP noted to be very high.  She has not been able to get her BP meds.   ED Course: MRI confirms acute-subacute ischemic pontine stroke.  ? Of MS as well.   Review of Systems: As per HPI otherwise 10 point review of systems negative.   Past Medical History:  Diagnosis Date  . Fibroids   . Hx MRSA infection   . Hx-TIA (transient ischemic attack)    permanent insomnia  . Hypertension   . Insomnia due to medical condition     Past Surgical History:  Procedure Laterality Date  . WISDOM TOOTH EXTRACTION       reports that she has been smoking cigarettes.  She has been smoking about 1.00 pack per day. she has never used smokeless tobacco. She reports that she drinks alcohol. She reports that she does not use drugs.  Allergies  Allergen Reactions  . Penicillins Anaphylaxis    Has patient had a PCN reaction causing immediate rash, facial/tongue/throat swelling, SOB or lightheadedness with hypotension: Yes Has patient had a PCN reaction causing severe rash involving mucus membranes or skin necrosis: Yes Has patient had a PCN reaction that required hospitalization Yes Has patient had a PCN reaction occurring within the last 10 years: No If all of the above answers are "NO", then may proceed with Cephalosporin use.   . Sulfonamide Derivatives Anaphylaxis  . Clonidine Derivatives Other (See Comments)    Patient passed out shortly after ingesting this at a doctor's office on  05/08/16 (was taken in conjunction with a tablet of Labetalol.)  . Flagyl [Metronidazole] Swelling and Other (See Comments)    Skin blisters  . Labetalol Other (See Comments)    Patient passed out shortly after ingesting this at a doctor's office on 05/08/16 (was taken in conjunction with a tablet of Clonidine.)    Family History  Problem Relation Age of Onset  . Hypertension Mother   . Diabetes Sister   . Asthma Brother   . Diabetes Brother   . Anesthesia problems Neg Hx      Prior to Admission medications   Medication Sig Start Date End Date Taking? Authorizing Provider  diphenhydramine-acetaminophen (TYLENOL PM) 25-500 MG TABS tablet Take 2 tablets by mouth at bedtime as needed (sleep).   Yes [provider]  hydrochlorothiazide (HYDRODIURIL) 12.5 MG tablet Take 1 tablet (12.5 mg total) by mouth 2 (two) times daily. 01/21/17  Yes Little, Wenda Overland, MD  metoprolol tartrate (LOPRESSOR) 25 MG tablet Take 1 tablet (25 mg total) by mouth 2 (two) times daily. 01/21/17  Yes Little, Wenda Overland, MD  verapamil (CALAN) 40 MG tablet Take 1 tablet (40 mg total) by mouth 2 (two) times daily. 01/21/17  Yes Little, Wenda Overland, MD    Physical Exam: Vitals:   07/18/17 1859 07/18/17 2019 07/19/17 0026 07/19/17 0029  BP: (!) 200/123 (!) 196/127 (!) 153/106 (!) 153/106  Pulse: 88 92 81 82  Resp: 18 14 (!) 21  Temp: 98 F (36.7 C)     TempSrc: Oral     SpO2: 100% 95% 100%   Weight:      Height:        Constitutional: NAD, calm, comfortable Eyes: PERRL, lids and conjunctivae normal ENMT: Mucous membranes are moist. Posterior pharynx clear of any exudate or lesions.Normal dentition.  Neck: normal, supple, no masses, no thyromegaly Respiratory: clear to auscultation bilaterally, no wheezing, no crackles. Normal respiratory effort. No accessory muscle use.  Cardiovascular: Regular rate and rhythm, no murmurs / rubs / gallops. No extremity edema. 2+ pedal pulses. No carotid bruits.    Abdomen: no tenderness, no masses palpated. No hepatosplenomegaly. Bowel sounds positive.  Musculoskeletal: no clubbing / cyanosis. No joint deformity upper and lower extremities. Good ROM, no contractures. Normal muscle tone.  Skin: no rashes, lesions, ulcers. No induration Neurologic: LUE and LLE weakness.  Also seems to have some mild speech impairment too. Psychiatric: Normal judgment and insight. Alert and oriented x 3. Normal mood.    Labs on Admission: I have personally reviewed following labs and imaging studies  CBC: Recent Labs  Lab 07/18/17 1850  WBC 7.2  NEUTROABS 4.0  HGB 12.8  HCT 38.2  MCV 88.0  PLT 195   Basic Metabolic Panel: Recent Labs  Lab 07/18/17 1850  NA 141  K 3.6  CL 106  CO2 25  GLUCOSE 93  BUN 33*  CREATININE 1.60*  CALCIUM 9.2   GFR: Estimated Creatinine Clearance: 42.9 mL/min (A) (by C-G formula based on SCr of 1.6 mg/dL (H)). Liver Function Tests: Recent Labs  Lab 07/18/17 1850  AST 34  ALT 13*  ALKPHOS 111  BILITOT 0.9  PROT 6.9  ALBUMIN 3.8   No results for input(s): LIPASE, AMYLASE in the last 168 hours. No results for input(s): AMMONIA in the last 168 hours. Coagulation Profile: Recent Labs  Lab 07/19/17 0024  INR 0.96   Cardiac Enzymes: No results for input(s): CKTOTAL, CKMB, CKMBINDEX, TROPONINI in the last 168 hours. BNP (last 3 results) No results for input(s): PROBNP in the last 8760 hours. HbA1C: No results for input(s): HGBA1C in the last 72 hours. CBG: No results for input(s): GLUCAP in the last 168 hours. Lipid Profile: No results for input(s): CHOL, HDL, LDLCALC, TRIG, CHOLHDL, LDLDIRECT in the last 72 hours. Thyroid Function Tests: No results for input(s): TSH, T4TOTAL, FREET4, T3FREE, THYROIDAB in the last 72 hours. Anemia Panel: No results for input(s): VITAMINB12, FOLATE, FERRITIN, TIBC, IRON, RETICCTPCT in the last 72 hours. Urine analysis:    Component Value Date/Time   COLORURINE AMBER (A)  07/18/2017 1856   APPEARANCEUR HAZY (A) 07/18/2017 1856   LABSPEC 1.030 07/18/2017 1856   PHURINE 5.0 07/18/2017 1856   GLUCOSEU NEGATIVE 07/18/2017 1856   HGBUR NEGATIVE 07/18/2017 1856   BILIRUBINUR MODERATE (A) 07/18/2017 1856   KETONESUR 5 (A) 07/18/2017 1856   PROTEINUR 100 (A) 07/18/2017 1856   UROBILINOGEN 1.0 07/22/2010 1536   NITRITE NEGATIVE 07/18/2017 1856   LEUKOCYTESUR NEGATIVE 07/18/2017 1856    Radiological Exams on Admission: Ct Head Wo Contrast  Result Date: 07/18/2017 CLINICAL DATA:  High blood pressure. Focal neuro deficits, suspect stroke EXAM: CT HEAD WITHOUT CONTRAST TECHNIQUE: Contiguous axial images were obtained from the base of the skull through the vertex without intravenous contrast. COMPARISON:  May 10, 2016 FINDINGS: Brain: No evidence of acute infarction, hemorrhage, hydrocephalus, extra-axial collection or mass lesion/mass effect. There is chronic diffuse atrophy. Chronic bilateral periventricular white matter small vessel  ischemic changes identified. 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 Vascular: No hyperdense vessel or unexpected calcification. Skull: Normal. Negative for fracture or focal lesion. Sinuses/Orbits: No acute finding. Other: None. IMPRESSION: No focal acute intracranial abnormality identified. Chronic diffuse atrophy. Chronic bilateral periventricular white matter small vessel ischemic change. Electronically Signed   By: Abelardo Diesel M.D.   On: 07/18/2017 18:01   Mr Brain W And Wo Contrast  Result Date: 07/18/2017 CLINICAL DATA:  51 y/o  F; left-sided numbness. EXAM: MRI HEAD WITHOUT AND WITH CONTRAST TECHNIQUE: Multiplanar, multiecho pulse sequences of the brain and surrounding structures were obtained without and with intravenous contrast. CONTRAST:  22mL MULTIHANCE GADOBENATE DIMEGLUMINE 529 MG/ML IV SOLN COMPARISON:  07/18/2017 CT head.  04/23/2006 MRI head. FINDINGS: Brain: Right posterior paramedian pontine  subcentimeter focus of reduced diffusion (series 4, image 13). Numerous foci of T2 FLAIR hyperintense signal abnormality are present throughout subcortical and periventricular white matter. There are lesions within the genu of corpus callosum, patchy foci throughout the pons, bilateral thalamus, brachium pontis, and cerebellar white matter. Cavum septum pellucidum. Mild brain parenchymal volume loss. No abnormal enhancement of the brain. No susceptibility hypointensity to suggest intracranial hemorrhage. Vascular: Normal flow voids. Skull and upper cervical spine: Normal marrow signal. Sinuses/Orbits: Sphenoid sinus mucosal thickening. Trace right mastoid effusion. Otherwise negative. Other: None. IMPRESSION: Right posterior paramedian pontine focus of reduced diffusion and progressed age advanced white matter lesions. Findings probably represent an acute/early subacute pontine infarction and chronic microvascular ischemic changes of white matter, particularly in the setting of hypertension or diabetes. Given lesions in corpus callosum and cerebellum, a demyelinating process such as MS should also be considered. Electronically Signed   By: Kristine Garbe M.D.   On: 07/18/2017 22:13    EKG: Independently reviewed.  Assessment/Plan Principal Problem:   Acute ischemic stroke (HCC) Active Problems:   HTN (hypertension)   Renal insufficiency    1. Acute ischemic stroke - 1. Stroke pathway 2. 2d echo, carotid dopplers, MRA head 3. Tele monitor 4. A1C and lipid panel 5. PT/OT/SLP 6. ASA 325 7. Neuro consulted to see at [facility] 2. HTN - 1. Will resume home BP meds 2. Well out of time frame for permissive HTN given that symptoms onset on Wed, constant since then 3. Renal insufficiency - 1. Suspect she may have CKD stage 3 from chronic uncontrolled HTN 2. Repeat BMP in AM 3. B renal US  DVT prophylaxis: Lovenox Code Status: Full Family Communication: No family in room Disposition Plan:  Home after admit Consults called: Dr. Leonel Ramsay Admission status: Admit to Inpatient - inpatient status for confirmed stroke   Iyesha Such, [employer] [facility] Pager [phone]  If 7AM-7PM, please contact day team taking care of patient [email or web address] Password [email or web address]  07/19/2017, 1:01 AM

## 2017-07-19 NOTE — ED Notes (Signed)
Patient transported to MRI 

## 2017-07-19 NOTE — ED Notes (Signed)
Dr. Aileen Fass paged regarding patient's elevated blood pressure. No new orders at this time.

## 2017-07-19 NOTE — Consult Note (Signed)
Neurology Consultation Reason for Consult: Stroke Referring Physician: Sheran Luz  CC: Stroke  History is obtained from: Patient  HPI: Karen Ochoa is a 51 y.o. female with a history of hypertension since age 53 as well as long history of smoking who presents with left-sided weakness has been present for the past few days.  She states that she noticed it on Wednesday, it has been persistent ever since.  She noticed that her blood pressure was quite high today and therefore sought care in the emergency department.  She denies any history of progressive numbness/weakness or vision loss in the past.  She has had " mini strokes"in the past.  LKW: Wednesday tpa given?: no, out of window  ROS: A 14 point ROS was performed and is negative except as noted in the HPI.   Past Medical History:  Diagnosis Date  . Fibroids   . Hx MRSA infection   . Hx-TIA (transient ischemic attack)    permanent insomnia  . Hypertension   . Insomnia due to medical condition      Family History  Problem Relation Age of Onset  . Hypertension Mother   . Diabetes Sister   . Asthma Brother   . Diabetes Brother   . Anesthesia problems Neg Hx      Social History:  reports that she has been smoking cigarettes.  She has been smoking about 1.00 pack per day. she has never used smokeless tobacco. She reports that she drinks alcohol. She reports that she does not use drugs.   Exam: Current vital signs: BP (!) 168/99   Pulse 84   Temp 98.5 F (36.9 C)   Resp (!) 22   Ht 5\' 7"  (1.702 m)   Wt 74.8 kg (165 lb)   LMP 05/19/2013   SpO2 100%   BMI 25.84 kg/m  Vital signs in last 24 hours: Temp:  [97.6 F (36.4 C)-98.5 F (36.9 C)] 98.5 F (36.9 C) (12/30 2316) Pulse Rate:  [81-102] 84 (12/31 0230) Resp:  [14-22] 22 (12/31 0230) BP: (152-214)/(92-141) 168/99 (12/31 0230) SpO2:  [95 %-100 %] 100 % (12/31 0230) Weight:  [74.8 kg (165 lb)] 74.8 kg (165 lb) (12/30 1418)   Physical Exam   Constitutional: Appears well-developed and well-nourished.  Psych: Affect appropriate to situation Eyes: No scleral injection HENT: No OP obstrucion Head: Normocephalic.  Cardiovascular: Normal rate and regular rhythm.  Respiratory: Effort normal, non-labored breathing GI: Soft.  No distension. There is no tenderness.  Skin: WDI  Neuro: Mental Status: Patient is awake, alert, oriented to person, place, month, year, and situation. Patient is able to give a clear and coherent history. No signs of aphasia or neglect Cranial Nerves: II: Visual Fields are full. Pupils are equal, round, and reactive to light.   III,IV, VI: EOMI without ptosis or diploplia.  V: Facial sensation is symmetric to temperature VII: Facial movement is symmetric.  VIII: hearing is intact to voice X: Uvula elevates symmetrically XI: Shoulder shrug is symmetric. XII: tongue is midline without atrophy or fasciculations.  Motor: Tone is normal. Bulk is normal.  She has mild drift in her left upper extremity, mild 4+/5 weakness of the left arm 5-/5 in the left leg Sensory: Sensation is symmetric to light touch and temperature in the arms and legs. Cerebellar: FNF and HKS are intact bilaterally   I have reviewed labs in epic and the results pertinent to this consultation are: CMP-elevated creatinine  I have reviewed the images obtained: MRI brain-  small brainstem stroke with extensive white matter change  Impression: 51 year old female with small vessel ischemic infarct.  I suspect that the changes on her MRI brain are due to her long-standing smoking/hypertension.  The lack of abnormal enhancement would argue against a demyelinating cause of her current presentation.  Recommendations: 1. HgbA1c, fasting lipid panel 2. MRA  of the brain without contrast 3. Frequent neuro checks 4. Echocardiogram 5. Carotid dopplers 6. Prophylactic therapy-Antiplatelet med: Aspirin - dose 325mg  PO or 300mg  PR 7. Risk  factor modification 8. Telemetry monitoring 9. PT consult, OT consult, Speech consult 10. please page stroke NP  Or  PA  Or MD  from 8am -4 pm as this patient will be followed by the stroke team at this point.   You can look them up on [email or web address]      Roland Rack, MD [facility] [phone]  If 7pm- 7am, please page neurology on call as listed in [place].

## 2017-07-19 NOTE — ED Notes (Signed)
Patient transported to Ultrasound/Echo at bedside.

## 2017-07-19 NOTE — ED Notes (Signed)
Per Neuro, PA he is on the way to see patient. Informed him of patient's blood pressure and he is ok with current readings. Stated as long as patient is less than 210/110. Will evaluate once onsite to see patient.

## 2017-07-19 NOTE — Progress Notes (Signed)
Carotid artery duplex has been completed. 1-39% ICA stenosis bilaterally.  07/19/17 10:15 AM Karen Ochoa RVT

## 2017-07-19 NOTE — Progress Notes (Signed)
Subjective: Still feels weak on the left side  Exam: Vitals:   07/19/17 1411 07/19/17 1500  BP: (!) 146/97 (!) 142/98  Pulse: 79 79  Resp: 18 20  Temp:    SpO2: 99% 99%    HEENT-  Normocephalic, no lesions, without obvious abnormality.  Normal external eye and conjunctiva.  Normal TM's bilaterally.  Normal auditory canals and external ears. Normal external nose, mucus membranes and septum.  Normal pharynx. Cardiovascular- S1, S2 normal, pulses palpable throughout   Lungs- chest clear, no wheezing, rales, normal symmetric air entry, Heart exam - S1, S2 normal, no murmur, no gallop, rate regular Abdomen- normal findings: bowel sounds normal Extremities- no edema    Neuro:  CN: Pupils are equal and round. They are symmetrically reactive from 3-->2 mm. EOMI without nystagmus. Facial sensation is intact to light touch. Face is asymmetric at rest and with smile on leftwith normal strength and mobility. Hearing is intact to conversational voice. Palate elevates symmetrically and uvula is midline. Voice is normal in tone, pitch and quality. Bilateral SCM and trapezii are 5/5. Tongue is midline with normal bulk and mobility.  Motor: Normal bulk, tone, and strength. 5/5 throughout. left arm and leg drift Sensation: Intact to light touch.  DTRs: 2+, symmetric  Toes downgoing bilaterally. No pathologic reflexes.  Coordination: Finger-to-nose and heel-to-shin are without dysmetria   Medications:  Scheduled: .  stroke: mapping our early stages of recovery book   Does not apply Once  . aspirin  300 mg Rectal Daily   Or  . aspirin  325 mg Oral Daily  . enoxaparin (LOVENOX) injection  40 mg Subcutaneous QHS  . hydrochlorothiazide  12.5 mg Oral BID  . metoprolol tartrate  25 mg Oral BID  . verapamil  40 mg Oral BID    Pertinent Labs/Diagnostics: Carotid doppler 1-39% bilaterally LDL 105 A1c 5.9   Dg Chest 2 View  Result Date: 07/19/2017 CLINICAL DATA:  Acute onset of left-sided  weakness.  Cough. EXAM: CHEST  2 VIEW COMPARISON:  Chest radiograph performed 08/29/2015 FINDINGS: The lungs are well-aerated and clear. There is no evidence of focal opacification, pleural effusion or pneumothorax. The heart is borderline enlarged. No acute osseous abnormalities are seen. IMPRESSION: Borderline cardiomegaly.  Lungs remain grossly clear. Electronically Signed   By: Garald Balding M.D.   On: 07/19/2017 01:30   Ct Head Wo Contrast  Result Date: 07/18/2017 CLINICAL DATA:  High blood pressure. Focal neuro deficits, suspect stroke EXAM: CT HEAD WITHOUT CONTRAST TECHNIQUE: Contiguous axial images were obtained from the base of the skull through the vertex without intravenous contrast. COMPARISON:  May 10, 2016 FINDINGS: Brain: No evidence of acute infarction, hemorrhage, hydrocephalus, extra-axial collection or mass lesion/mass effect. There is chronic diffuse atrophy. Chronic bilateral periventricular white matter small vessel ischemic changes identified. 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 Vascular: No hyperdense vessel or unexpected calcification. Skull: Normal. Negative for fracture or focal lesion. Sinuses/Orbits: No acute finding. Other: None. IMPRESSION: No focal acute intracranial abnormality identified. Chronic diffuse atrophy. Chronic bilateral periventricular white matter small vessel ischemic change. Electronically Signed   By: Abelardo Diesel M.D.   On: 07/18/2017 18:01   Mr Karen Ochoa Head Wo Contrast  Result Date: 07/19/2017 CLINICAL DATA:  Left-sided weakness. Acute right pontine infarct and advanced white matter disease on MRI. EXAM: MRA HEAD WITHOUT CONTRAST TECHNIQUE: Angiographic images of the Circle of Willis were obtained using MRA technique without intravenous contrast. COMPARISON:  None. FINDINGS: There is mild-to-moderate  motion artifact despite repeated imaging. The visualized distal vertebral arteries are widely patent to the basilar with the left being  slightly dominant. Patent PICA and SCA origins are identified bilaterally. The basilar artery is widely patent. There is a small right posterior communicating artery. The PCAs are patent with evidence of a severe proximal P2 stenosis on the left. The internal carotid arteries are patent from skullbase to carotid termini with suspected mild right cavernous stenosis, however motion artifact limits assessment. ACAs and MCAs are patent without evidence of significant M1 or right A1 stenosis. The left A1 segment is hypoplastic. No proximal branch occlusion is identified. Assessment for branch vessel stenosis is limited by motion. No aneurysm is identified. IMPRESSION: 1. Motion degraded examination. 2. No large vessel occlusion or evidence of flow limiting proximal anterior circulation stenosis. 3. Severe proximal left P2 stenosis. Electronically Signed   By: Logan Bores M.D.   On: 07/19/2017 07:04   Mr Brain W And Wo Contrast  Result Date: 07/18/2017 CLINICAL DATA:  51 y/o  F; left-sided numbness. EXAM: MRI HEAD WITHOUT AND WITH CONTRAST TECHNIQUE: Multiplanar, multiecho pulse sequences of the brain and surrounding structures were obtained without and with intravenous contrast. CONTRAST:  50mL MULTIHANCE GADOBENATE DIMEGLUMINE 529 MG/ML IV SOLN COMPARISON:  07/18/2017 CT head.  04/23/2006 MRI head. FINDINGS: Brain: Right posterior paramedian pontine subcentimeter focus of reduced diffusion (series 4, image 13). Numerous foci of T2 FLAIR hyperintense signal abnormality are present throughout subcortical and periventricular white matter. There are lesions within the genu of corpus callosum, patchy foci throughout the pons, bilateral thalamus, brachium pontis, and cerebellar white matter. Cavum septum pellucidum. Mild brain parenchymal volume loss. No abnormal enhancement of the brain. No susceptibility hypointensity to suggest intracranial hemorrhage. Vascular: Normal flow voids. Skull and upper cervical spine:  Normal marrow signal. Sinuses/Orbits: Sphenoid sinus mucosal thickening. Trace right mastoid effusion. Otherwise negative. Other: None. IMPRESSION: Right posterior paramedian pontine focus of reduced diffusion and progressed age advanced white matter lesions. Findings probably represent an acute/early subacute pontine infarction and chronic microvascular ischemic changes of white matter, particularly in the setting of hypertension or diabetes. Given lesions in corpus callosum and cerebellum, a demyelinating process such as MS should also be considered. Electronically Signed   By: Kristine Garbe M.D.   On: 07/18/2017 22:13   US Renal  Result Date: 07/19/2017 CLINICAL DATA:  Acute onset of renal insufficiency. EXAM: RENAL / URINARY TRACT ULTRASOUND COMPLETE COMPARISON:  None. FINDINGS: Right Kidney: Length: 10.0 cm. Echogenicity within normal limits. No mass or hydronephrosis visualized. Left Kidney: Length: 9.6 cm. Echogenicity within normal limits. A few tiny left renal cysts are noted, the largest of which measures 0.8 cm at the upper pole of the left kidney. No hydronephrosis visualized. Evaluation of the left kidney is somewhat suboptimal due to overlying bowel gas. Bladder: Appears normal for degree of bladder distention. IMPRESSION: 1. No evidence of hydronephrosis. 2. Tiny left renal cysts noted. Electronically Signed   By: Garald Balding M.D.   On: 07/19/2017 03:44     Etta Quill PA-C [facility] [phone]  Impression: right pontine infarct. With elevated LDL and A1c.    Recommendations: 1)echo 2) PT/OT 3) correction of LDL and A1C Stroke MD to see in AM    07/19/2017, 3:57 PM

## 2017-07-19 NOTE — Progress Notes (Signed)
PT Cancellation Note  Patient Details Name: Karen Ochoa MRN: [id number] DOB: 1966/01/04   Cancelled Treatment:    Reason Eval/Treat Not Completed: Patient not medically ready , there is still concern for Increased BP. Will check back in AM.   [name] 07/19/2017, 4:05 PM [name] PT [phone]

## 2017-07-20 ENCOUNTER — Encounter (HOSPITAL_COMMUNITY): Payer: Self-pay | Admitting: General Practice

## 2017-07-20 ENCOUNTER — Inpatient Hospital Stay (HOSPITAL_COMMUNITY): Payer: Medicaid Other

## 2017-07-20 DIAGNOSIS — I6302 Cerebral infarction due to thrombosis of basilar artery: Secondary | ICD-10-CM

## 2017-07-20 DIAGNOSIS — I340 Nonrheumatic mitral (valve) insufficiency: Secondary | ICD-10-CM

## 2017-07-20 DIAGNOSIS — R9082 White matter disease, unspecified: Secondary | ICD-10-CM

## 2017-07-20 DIAGNOSIS — I361 Nonrheumatic tricuspid (valve) insufficiency: Secondary | ICD-10-CM

## 2017-07-20 DIAGNOSIS — I5021 Acute systolic (congestive) heart failure: Secondary | ICD-10-CM

## 2017-07-20 LAB — ECHOCARDIOGRAM COMPLETE
AOASC: 36 cm
CHL CUP MV DEC (S): 194
CHL CUP REG VEL DIAS: 146 cm/s
CHL CUP TV REG PEAK VELOCITY: 235 cm/s
E decel time: 194 msec
FS: 25 % — AB (ref 28–44)
Height: 67 in
IV/PV OW: 1.06
LA ID, A-P, ES: 35 mm
LA diam end sys: 35 mm
LADIAMINDEX: 1.8 cm/m2
LAVOL: 72.7 mL
LAVOLA4C: 58.9 mL
LAVOLIN: 37.4 mL/m2
LDCA: 3.14 cm2
LV PW d: 12.7 mm — AB (ref 0.6–1.1)
LVOT diameter: 20 mm
MRPISAEROA: 0.07 cm2
MV Peak grad: 5 mmHg
MV VTI: 191 cm
MV pk A vel: 103 m/s
MVPKEVEL: 110 m/s
PV Reg grad dias: 9 mmHg
TAPSE: 23 mm
TRMAXVEL: 235 cm/s
Weight: 2779.56 oz

## 2017-07-20 LAB — BRAIN NATRIURETIC PEPTIDE: B NATRIURETIC PEPTIDE 5: 306.8 pg/mL — AB (ref 0.0–100.0)

## 2017-07-20 LAB — MRSA PCR SCREENING: MRSA by PCR: NEGATIVE

## 2017-07-20 LAB — TSH: TSH: 0.856 u[IU]/mL (ref 0.350–4.500)

## 2017-07-20 MED ORDER — INFLUENZA VAC SPLIT QUAD 0.5 ML IM SUSY: 0.5000 mL | PREFILLED_SYRINGE | INTRAMUSCULAR | Status: DC

## 2017-07-20 MED ORDER — HYDRALAZINE HCL 20 MG/ML IJ SOLN: 10.0000 mg | Freq: Four times a day (QID) | INTRAMUSCULAR | Status: DC | PRN

## 2017-07-20 MED ORDER — PNEUMOCOCCAL VAC POLYVALENT 25 MCG/0.5ML IJ INJ: 0.5000 mL | INJECTION | INTRAMUSCULAR | Status: DC

## 2017-07-20 MED ORDER — CARVEDILOL 12.5 MG PO TABS
12.5000 mg | ORAL_TABLET | Freq: Two times a day (BID) | ORAL | Status: DC
  Administered 2017-07-20 – 2017-07-21 (×2): 12.5 mg via ORAL
  Filled 2017-07-20 (×2): qty 1

## 2017-07-20 MED ORDER — LISINOPRIL 5 MG PO TABS
5.0000 mg | ORAL_TABLET | Freq: Every day | ORAL | Status: DC
  Administered 2017-07-20: 5 mg via ORAL
  Filled 2017-07-20 (×2): qty 1

## 2017-07-20 MED ORDER — POTASSIUM CHLORIDE CRYS ER 20 MEQ PO TBCR
40.0000 meq | EXTENDED_RELEASE_TABLET | Freq: Once | ORAL | Status: AC
  Administered 2017-07-20: 40 meq via ORAL
  Filled 2017-07-20: qty 2

## 2017-07-20 NOTE — Evaluation (Signed)
Occupational Therapy Evaluation Patient Details Name: Karen Ochoa MRN: [id number] DOB: 06-01-1966 Today's Date: 07/20/2017    History of Present Illness Karen Ochoa is a 52 y.o. female with a history of hypertension since age 23 as well as long history of smoking who presents with left-sided weakness has been present for the past few days.  MRI positive: Right posterior paramedian pontine focus of reduced diffusion and progressed age advanced white matter lesions. Findings probably represent an acute/early subacute pontine infarction and chronic microvascular ischemic changes of white matter, particularly in the setting of hypertension or diabetes. Given lesions in corpus callosum and cerebellum, a demyelinating process such as MS should also be considered.    Clinical Impression   PTA, pt was living with her boyfriend and was independent. Pt currently performing ADLs and functional mobility at supervision-Min guard level. Pt presenting with decreased balance, cognition, and functional use of LUE (non-dominant). Pt would benefit from further acute OT to address exercises for LUE and cognition. Recommend dc home once medically stable per physican.     Follow Up Recommendations  No OT follow up;Supervision/Assistance - 24 hour    Equipment Recommendations  None recommended by OT    Recommendations for Other Services PT consult     Precautions / Restrictions Precautions Precautions: Fall Restrictions Weight Bearing Restrictions: No      Mobility Bed Mobility Overal bed mobility: Modified Independent                Transfers Overall transfer level: Modified independent                    Balance Overall balance assessment: Needs assistance   Sitting balance-Leahy Scale: Good       Standing balance-Leahy Scale: Good Standing balance comment: good static balance, some decreased dynamic balance                           ADL either performed or  assessed with clinical judgement   ADL Overall ADL's : Needs assistance/impaired                                       General ADL Comments: Pt performing near baseline level for ADLs with supervision-min guard. Pt with decreased balance and cognition.   Pt presenting with poor insight and awareness throughout session. Pt requiring Mod cues during trail making task and getting lost twice. Prior to session, pt having left unit to get her wallet out of her boyfriend's car. When asked about event, pt stating "It wasn't to smart. Took it all out of me, and I was tired." Demonstrated no further insight into why this was unsafe. At beginning of session, pt needing max cues to place down phone (playing candy crush game) to participate in therapy session.     Vision Baseline Vision/History: Wears glasses Wears Glasses: At all times Patient Visual Report: No change from baseline Vision Assessment?: Yes;Vision impaired- to be further tested in functional context Eye Alignment: Within Functional Limits Tracking/Visual Pursuits: Decreased smoothness of horizontal tracking;Decreased smoothness of vertical tracking Visual Fields: No apparent deficits Depth Perception: Overshoots Additional Comments: Pt with decreased smooth tracking and poor convergency. Demonstrating vision fatigue and unabel to maintain gaze     Perception     Praxis Praxis Praxis-Other Comments: Pt with poor motor planning as seen during ADLs, funcitonal  mobility, and finger to nose test for LUE    Pertinent Vitals/Pain Pain Assessment: Faces Faces Pain Scale: Hurts a little bit Pain Location: L LE Pain Descriptors / Indicators: Tingling;Discomfort Pain Intervention(s): Monitored during session;Limited activity within patient's tolerance;Repositioned     Hand Dominance Right   Extremity/Trunk Assessment Upper Extremity Assessment Upper Extremity Assessment: LUE deficits/detail LUE Deficits / Details:  Decreased grasp strength and FM skills. Poor sensation LUE Sensation: decreased light touch LUE Coordination: decreased fine motor   Lower Extremity Assessment Lower Extremity Assessment: Defer to PT evaluation LLE Deficits / Details: functional weakness with hyperextension and popping back of knee at times, strength grossly 4-/5 throughout, decreased sensation/proprioception and coordination LLE Sensation: decreased light touch;decreased proprioception LLE Coordination: decreased gross motor   Cervical / Trunk Assessment Cervical / Trunk Assessment: Normal   Communication Communication Communication: No difficulties   Cognition Arousal/Alertness: Awake/alert Behavior During Therapy: WFL for tasks assessed/performed Overall Cognitive Status: Within Functional Limits for tasks assessed                                 General Comments: Pt with poor problem sovling, awarness, and judgment. Pt with poor reasoning and requiring Mod VCs during path finding task; pt getting lost during task.    General Comments  Cousin present for begining of session. Discussed waiting to drive until cleared by MD    Exercises Other Exercises Other Exercises: standing hamstring curls, hip extension, hip abduction, mini squats, and heel raises x 5 at rail in hallway   Shoulder Instructions      [place] expects to be discharged to:: Private residence Living Arrangements: Non-relatives/Friends;Spouse/significant other(Cousin) Available Help at Discharge: Family(boyfriend truck driver so her cousin will stay with her initially) Type of Home: House Home Access: Stairs to enter Technical brewer of Steps: 2   Home Layout: One level     Bathroom Shower/Tub: Teacher, early years/pre: Standard     Home Equipment: None          Prior Functioning/Environment Level of Independence: Independent        Comments: ADLs and IADLs including driving         OT Problem List: Decreased strength;Decreased range of motion;Decreased activity tolerance;Impaired vision/perception;Decreased cognition;Impaired balance (sitting and/or standing);Decreased safety awareness;Decreased knowledge of use of DME or AE;Pain      OT Treatment/Interventions: Self-care/ADL training;Therapeutic exercise;Energy conservation;DME and/or AE instruction;Therapeutic activities;Patient/family education    OT Goals(Current goals can be found in the care plan section) Acute Rehab OT Goals Patient Stated Goal: To get leg stronger OT Goal Formulation: With patient Time For Goal Achievement: 08/03/17 Potential to Achieve Goals: Good ADL Goals Pt Will Perform Grooming: Independently;standing Pt Will Perform Tub/Shower Transfer: Tub transfer;ambulating;Independently Pt/caregiver will Perform Home Exercise Program: Increased ROM;Increased strength;Left upper extremity;With theraputty;With written HEP provided;Independently Additional ADL Goal #1: Pt will perform three step trail making task with 1-2 VCs  OT Frequency: Min 2X/week   Barriers to D/C:            Co-evaluation              AM-PAC PT "6 Clicks" Daily Activity     Outcome Measure Help from another person eating meals?: None Help from another person taking care of personal grooming?: None Help from another person toileting, which includes using toliet, bedpan, or urinal?: A Little Help from another person bathing (including washing, rinsing, drying)?: A  Little Help from another person to put on and taking off regular upper body clothing?: A Little Help from another person to put on and taking off regular lower body clothing?: A Little 6 Click Score: 20   End of Session Equipment Utilized During Treatment: Gait belt Nurse Communication: Mobility status  Activity Tolerance: Patient tolerated treatment well;Patient limited by fatigue Patient left: in bed;with call bell/phone within reach  OT Visit  Diagnosis: Unsteadiness on feet (R26.81);Other abnormalities of gait and mobility (R26.89);Muscle weakness (generalized) (M62.81);Other symptoms and signs involving cognitive function;Hemiplegia and hemiparesis Hemiplegia - Right/Left: Left Hemiplegia - dominant/non-dominant: Non-Dominant Hemiplegia - caused by: Cerebral infarction                Time: 8756-4332 OT Time Calculation (min): 18 min Charges:  OT General Charges $OT Visit: 1 Visit OT Evaluation $OT Eval Moderate Complexity: 1 Mod G-Codes:     [place] MSOT, OTR/L Acute Rehab Pager: [phone] Office: [place] 07/20/2017, 4:42 PM

## 2017-07-20 NOTE — Progress Notes (Signed)
  Echocardiogram 2D Echocardiogram has been performed.  Karen Ochoa 07/20/2017, 12:34 PM

## 2017-07-20 NOTE — Progress Notes (Signed)
NEUROHOSPITALISTS STROKE TEAM - DAILY PROGRESS NOTE   ADMISSION HISTORY: Karen Ochoa is a 52 y.o. female with a history of hypertension since age 23 as well as long history of smoking who presents with left-sided weakness has been present for the past few days.  She states that she noticed it on Wednesday, it has been persistent ever since.  She noticed that her blood pressure was quite high today and therefore sought care in the emergency department. She denies any history of progressive numbness/weakness or vision loss in the past.  She has had " mini strokes"in the past.  LKW: Wednesday tpa given?: no, out of window  SUBJECTIVE (INTERVAL HISTORY) No family is at the bedside. Patient is found laying in bed in NAD. Overall she feels her condition is unchanged. Voices no new complaints.  Denies headache.  No new events reported overnight.  Patient explains she ran out of refills for her blood pressure medication and has not taken any of her medications in a few months.  Patient tells me she does have a history of migraine headaches as a teenager.  Last migraine was over 15 years ago.  Symptoms are blurred vision with painful headache that lasts for days.  She denies any family history of stroke or MS.   OBJECTIVE Lab Results: CBC:  Recent Labs  Lab 07/18/17 1850  WBC 7.2  HGB 12.8  HCT 38.2  MCV 88.0  PLT 194   BMP: Recent Labs  Lab 07/18/17 1850 07/19/17 0556  NA 141 140  K 3.6 3.2*  CL 106 107  CO2 25 24  GLUCOSE 93 90  BUN 33* 33*  CREATININE 1.60* 1.45*  CALCIUM 9.2 9.0   Liver Function Tests:  Recent Labs  Lab 07/18/17 1850  AST 34  ALT 13*  ALKPHOS 111  BILITOT 0.9  PROT 6.9  ALBUMIN 3.8   Coagulation Studies:  Recent Labs    07/19/17 0024  APTT 29  INR 0.96   PHYSICAL EXAM Temp:  [97.7 F (36.5 C)] 97.7 F (36.5 C) (01/01 1047) Pulse Rate:  [62-83] 62 (01/01 1047) Resp:  [14-20] 18 (01/01  1047) BP: (135-165)/(97-124) 135/107 (01/01 1047) SpO2:  [95 %-100 %] 98 % (01/01 1047) Weight:  [78.8 kg (173 lb 11.6 oz)] 78.8 kg (173 lb 11.6 oz) (12/31 2000) General - Well nourished, well developed, in no apparent distress HEENT-  Normocephalic, Normal external eye/conjunctiva.  Normal external ears. Normal external nose, mucus membranes and septum.   Cardiovascular - Regular rate and rhythm  Respiratory - Lungs clear bilaterally. No wheezing. Abdomen - soft and non-tender, BS normal Extremities- no edema or cyanosis Neuro: Mental Status: Patient is awake, alert, oriented to person, place, month, year, and situation. Patient is able to give a clear and coherent history. No signs of aphasia or neglect Cranial Nerves: II: Visual Fields are full. Pupils are equal, round, and reactive to light.   III,IV, VI: EOMI without ptosis or diploplia.  V: Facial sensation is symmetric to temperature VII: Slight facial droop on left VIII: hearing is intact to voice X: Uvula elevates symmetrically XI: Shoulder shrug is symmetric. XII: tongue is midline without atrophy or fasciculations.  Motor: Tone is normal. Bulk is normal.  She has mild drift in her left upper extremity, mild 4+/5 weakness of the left arm 4+/5 in the left leg Sensory: Sensation is decreased to light touch on left. Cerebellar: FNF and HKS are intact bilaterally  IMAGING: I have personally reviewed the  radiological images below and agree with the radiology interpretations.  Ct Head Wo Contrast Result Date: 07/18/2017 IMPRESSION: No focal acute intracranial abnormality identified. Chronic diffuse atrophy. Chronic bilateral periventricular white matter small vessel ischemic change.   Mr Jodene Nam Head Wo Contrast Result Date: 07/19/2017 IMPRESSION: 1. Motion degraded examination. 2. No large vessel occlusion or evidence of flow limiting proximal anterior circulation stenosis. 3. Severe proximal left P2 stenosis.   Mr Jeri Cos  And Wo Contrast Result Date: 07/18/2017 IMPRESSION: Right posterior paramedian pontine focus of reduced diffusion and progressed age advanced white matter lesions. Findings probably represent an acute/early subacute pontine infarction and chronic microvascular ischemic changes of white matter, particularly in the setting of hypertension or diabetes. Given lesions in corpus callosum and cerebellum, a demyelinating process such as MS should also be considered.   Echocardiogram:                                              PENDING  B/L Carotid U/S:                                                 Final Interpretation: Right Carotid: There is evidence in the right ICA of a 1-39% stenosis. Left Carotid: There is evidence in the left ICA of a 1-39% stenosis. Vertebrals: Both vertebral arteries were patent with antegrade flow.     IMPRESSION: [name] is a 52 y.o. female with PMH of hypertension since age 77, long history of smoking, history of TIA in 2007, who presents with left-sided weakness.  MRI brain reveals:  Acute/early subacute Right pontine infarction  Suspected Etiology: Small vessel disease Resultant Symptoms: Left-sided weakness Stroke Risk Factors: hypertension and smoking Other Stroke Risk Factors: Advanced age, Hx TIA, Migraines  Outstanding Stroke Work-up Studies:     Echocardiogram:                                                    PENDING  07/20/2017 ASSESSMENT:   Neuro exam remained stable with left-sided weakness.  Patient has been counseled to stop smoking and to be compliant with medications.  Patient will need case management assistance.  Echocardiogram pending.  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  Continue full dose aspirin for now and Lipitor 40 mg  Rule out MS Lack of abnormal enhancement would argue against a demyelinating cause of her current presentation. Follow-up with outpatient neurology, workup if indicated  HYPERTENSION: Stable Permissive hypertension (OK if <220/120) for 24-48 hours post stroke and then gradually normalized within 5-7 days. Long term BP goal normotensive. May slowly restart home B/P  medications after 48 hours Home Meds: HCTZ, metoprolol, verapamil-patient states she has not been taking any of her medications for a few months.  HYPERLIPIDEMIA:    Component Value Date/Time   CHOL 187 07/19/2017 0556   TRIG 210 (H) 07/19/2017 0556   HDL 40 (L) 07/19/2017 0556   CHOLHDL 4.7 07/19/2017 0556   VLDL 42 (H) 07/19/2017 0556   LDLCALC 105 (H) 07/19/2017 0556  Home Meds:  NONE LDL  goal < 70 Started on Lipitor to 40 mg daily Continue statin at discharge  R/O DIABETES: Lab Results  Component Value Date   HGBA1C 5.9 (H) 07/19/2017  HgbA1c goal < 7.0  TOBACCO ABUSE  Current smoker Smoking cessation counseling provided Nicotine patch provided  Other Active Problems: Principal Problem:   Cerebrovascular accident (CVA) ([place]) Active Problems:   HTN (hypertension)   Renal insufficiency    Hospital day # 1 VTE prophylaxis: Lovenox  Diet : Diet Heart Room service appropriate? Yes; Fluid consistency: Thin   FAMILY UPDATES: No family at bedside  TEAM UPDATES: Reyne Dumas, MD   Prior Home Stroke Medications:  No antithrombotic  Discharge Stroke Meds:  Please discharge patient on aspirin 325 mg daily and Lipitor 40 mg   Disposition: 01-Home  or Self Care Therapy Recs:               PENDING Home Equipment:         PENDING Follow Up:  Follow-up [place] Follow up.   Specialty:  Internal Medicine Why:  You can get a doctor here without insurance Contact information: [place] Follow up.   Why:  You can get a doctor here without insurance.  Once you have an appointment at one of [facility] clinics, you can use this location for your PHARMACY needs and for FINANCIAL COUNSELING. Contact information: [place] [address] [phone]       Dennie Bible, NP. Schedule an appointment as soon as possible for a visit in 6 week(s).   Specialty:  Family Medicine Contact information: [address] [place] [address] [phone]          Patient, No Pcp Per -PCP Follow up in 1-2 weeks   Case Management aware of need   Assessment & plan discussed with with attending physician and they are in agreement.    Renie Ora Stroke Neurology Team 07/20/2017 12:05 PM  ATTENDING NOTE: I reviewed above note and agree with the assessment and plan. I have made any additions or clarifications directly to the above note. Pt was seen and examined.   52 year old female with history of long-standing uncontrolled hypertension, smoker, history of stroke/TIA in 2007 admitted for mild left-sided weakness for a few days and high blood pressure at home.  She has been off medication for some time due to financial difficulty.  She had a lot of stress recently including breakup with her boyfriend, loss of job, family issues.  MRI showed right pontine small infarct as well as severe left matter ischemic changes.  MRI showed left P2 stenosis.  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  Her white matter changes most likely due to long-standing uncontrolled hypertension and smoking, less likely for MS or CADASIL.  LDL  105, A1c 5.9, UDS negative.  Put on aspirin 325 and Lipitor 40.  Resume home medication for blood pressure HCTZ, verapamil, and metoprolol.  Currently blood pressure under control.  Will need a Education officer, museum consultation for financial support as outpatient.  Patient educated on medication compliance and smoking cessation.  Consider cardiology consultation.  Will follow.  Rosalin Hawking, MD PhD Stroke Neurology 07/20/2017 3:59 PM     To contact Stroke Continuity provider, please refer to [email or web address] After hours, contact General Neurology

## 2017-07-20 NOTE — Evaluation (Signed)
Physical Therapy Evaluation Patient Details Name: Karen Ochoa MRN: [id number] DOB: 1966-03-18 Today's Date: 07/20/2017   History of Present Illness  Karen Ochoa is a 52 y.o. female with a history of hypertension since age 10 as well as long history of smoking who presents with left-sided weakness has been present for the past few days.  MRI positive: Right posterior paramedian pontine focus of reduced diffusion and progressed age advanced white matter lesions. Findings probably represent an acute/early subacute pontine infarction and chronic microvascular ischemic changes of white matter, particularly in the setting of hypertension or diabetes. Given lesions in corpus callosum and cerebellum, a demyelinating process such as MS should also be considered.   Clinical Impression  Patient presents with decreased L LE strength and sensory deficits limiting safety and independence with ambulation.  Currently S to mod I for mobility but with some gait deficits and imbalance improved when using cane.  She will benefit from skilled PT in the acute setting, but likely not to need follow up PT at d/c.    Follow Up Recommendations No PT follow up    Equipment Recommendations  Cane    Recommendations for Other Services       Precautions / Restrictions Precautions Precautions: Fall      Mobility  Bed Mobility Overal bed mobility: Modified Independent                Transfers Overall transfer level: Modified independent                  Ambulation/Gait Ambulation/Gait assistance: Supervision Ambulation Distance (Feet): 100 Feet(20') Assistive device: None;Straight cane Gait Pattern/deviations: Step-through pattern;Step-to pattern;Decreased stride length;Scissoring;Decreased dorsiflexion - left     General Gait Details: functional hip and knee weakness with recurvatum noted in L LE with ambulation, cane for second trial with cues for sequencing, hip and knee control and some  improved stability.  Stairs            Wheelchair Mobility    Modified Rankin (Stroke Patients Only) Modified Rankin (Stroke Patients Only) Pre-Morbid Rankin Score: No symptoms Modified Rankin: Moderate disability     Balance Overall balance assessment: Needs assistance   Sitting balance-Leahy Scale: Good       Standing balance-Leahy Scale: Good Standing balance comment: good static balance, some decreased dynamic balance                             Pertinent Vitals/Pain Pain Assessment: Faces Faces Pain Scale: Hurts little more Pain Location: L LE Pain Descriptors / Indicators: Tingling;Discomfort Pain Intervention(s): Monitored during session    Home Living Family/patient expects to be discharged to:: Private residence Living Arrangements: Spouse/significant other Available Help at Discharge: Family(boyfriend truck driver so her cousin will stay with her initially) Type of Home: House Home Access: Stairs to enter   Technical brewer of Steps: 2 Home Layout: One level Home Equipment: None      Prior Function Level of Independence: Independent               Hand Dominance        Extremity/Trunk Assessment   Upper Extremity Assessment Upper Extremity Assessment: Defer to OT evaluation    Lower Extremity Assessment Lower Extremity Assessment: LLE deficits/detail LLE Deficits / Details: functional weakness with hyperextension and popping back of knee at times, strength grossly 4-/5 throughout, decreased sensation/proprioception and coordination LLE Sensation: decreased light touch;decreased proprioception LLE Coordination: decreased gross  motor       Communication   Communication: No difficulties  Cognition Arousal/Alertness: Awake/alert Behavior During Therapy: WFL for tasks assessed/performed Overall Cognitive Status: Within Functional Limits for tasks assessed                                 General  Comments: questionable judgement as walked out to her boyfriend's car with him to get her wallet and had trouble finding her way back to the unit      General Comments General comments (skin integrity, edema, etc.): Issued HEP for L LE strength    Exercises Other Exercises Other Exercises: standing hamstring curls, hip extension, hip abduction, mini squats, and heel raises x 5 at rail in hallway   Assessment/Plan    PT Assessment Patient needs continued PT services  PT Problem List Decreased strength;Decreased balance;Decreased mobility;Decreased knowledge of precautions;Decreased knowledge of use of DME       PT Treatment Interventions DME instruction;Balance training;Gait training;Stair training;Functional mobility training;Therapeutic activities;Therapeutic exercise;Patient/family education    PT Goals (Current goals can be found in the Care Plan section)  Acute Rehab PT Goals Patient Stated Goal: To get leg stronger PT Goal Formulation: With patient Time For Goal Achievement: 07/23/17 Potential to Achieve Goals: Good    Frequency Min 4X/week   Barriers to discharge        Co-evaluation               AM-PAC PT "6 Clicks" Daily Activity  Outcome Measure Difficulty turning over in bed (including adjusting bedclothes, sheets and blankets)?: None Difficulty moving from lying on back to sitting on the side of the bed? : None Difficulty sitting down on and standing up from a chair with arms (e.g., wheelchair, bedside commode, etc,.)?: A Little Help needed moving to and from a bed to chair (including a wheelchair)?: A Little Help needed walking in hospital room?: A Little Help needed climbing 3-5 steps with a railing? : A Little 6 Click Score: 20    End of Session   Activity Tolerance: Patient tolerated treatment well Patient left: in bed;with call bell/phone within reach;with family/visitor present   PT Visit Diagnosis: Hemiplegia and hemiparesis;Other  abnormalities of gait and mobility (R26.89) Hemiplegia - Right/Left: Left Hemiplegia - dominant/non-dominant: Non-dominant Hemiplegia - caused by: Cerebral infarction    Time: 1405-1430 PT Time Calculation (min) (ACUTE ONLY): 25 min   Charges:   PT Evaluation $PT Eval Moderate Complexity: 1 Mod PT Treatments $Therapeutic Exercise: 8-22 mins   PT G CodesMagda Kiel, [place] [phone] 07/20/2017   Reginia Naas 07/20/2017, 3:58 PM

## 2017-07-20 NOTE — Progress Notes (Addendum)
[facility] PROGRESS NOTE  Karen Ochoa EYC:[id number] DOB: [birth year] DOA: 07/18/2017   PCP: Patient, No Pcp Per     Assessment/Plan: Principal Problem:   Cerebrovascular accident (CVA) ([place]) Active Problems:   HTN (hypertension)   Renal insufficiency   52 y.o. female with a history of hypertension since age 85 as well as long history of smoking who presents with left-sided weakness has been present for the past few days.  She states that she noticed it on Wednesday, it has been persistent ever since.  She noticed that her blood pressure was quite high today and therefore sought care in the emergency department. 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 HgbA1c 5.9, fasting lipid panel -LDL 105, triglycerides 210 2. MRA  of the brain without contrast -) time infarct 3. Frequent neuro checks stable 4. Echocardiogram  Severe concentric LVH with diffuse hypokinesis and LVEF 30-35%, no prior hx of comp , cardiology has been requested to see , hypertensive vs infiltrative comp 5. Carotid dopplers 1-39% bilaterally 6. Prophylactic therapy-Antiplatelet med: Aspirin - dose 325mg  PO or 300mg  PR, continue statin 7. Risk factor modification 8. Telemetry monitoring shows normal sinus rhythm 9. PT consult, OT consult, Speech consult -pending   Hypertension-blood pressure has improved  Currently in the 130s, patient is currently on HCTZ 12.5 mg by mouth twice a day and metoprolol 25 mg by mouth twice a day. She is also on verapamil 40 mg by mouth twice a day. Will add hydralazine for systolic greater than 149  Hypokalemia-repleted  Renal insufficiency Presenting creatinine was 1.6, now 1.45. Baseline appears to be around 1.3 Renal ultrasound did not show any evidence of hydronephrosis or stones  DVT prophylaxsis Lovenox  Code Status:  Full code    Family Communication: Discussed in detail with the patient, all imaging results, lab  results explained to the patient   Disposition Plan:   Anticipate discharge tomorrow after workup is complete      Consultants:   Neurology  Procedures:  None  Antibiotics: Anti-infectives (From admission, onward)   None         HPI/Subjective: Still has some numbness and tingling in her left UE   Objective: Vitals:   07/19/17 2000 07/19/17 2049 07/20/17 0228 07/20/17 1047  BP:  (!) 157/101 (!) 164/99 (!) 135/107  Pulse:  75 72 62  Resp:  19 19 18   Temp:   97.7 F (36.5 C) 97.7 F (36.5 C)  TempSrc:   Oral Oral  SpO2:  98% 100% 98%  Weight: 78.8 kg (173 lb 11.6 oz)     Height: 5\' 7"  (1.702 m)       Intake/Output Summary (Last 24 hours) at 07/20/2017 1135 Last data filed at 07/20/2017 0800 Gross per 24 hour  Intake 240 ml  Output -  Net 240 ml    Exam:  Examination:  General exam: Appears calm and comfortable  Respiratory system: Clear to auscultation. Respiratory effort normal. Cardiovascular system: S1 & S2 heard, RRR. No JVD, murmurs, rubs, gallops or clicks. No pedal edema. Gastrointestinal system: Abdomen is nondistended, soft and nontender. No organomegaly or masses felt. Normal bowel sounds heard. Central nervous system: Alert and oriented. No focal neurological deficits. Extremities: Symmetric 5 x 5 power. Skin: No rashes, lesions or ulcers Psychiatry: Judgement and insight appear normal. Mood & affect appropriate.     Data Reviewed: I have personally reviewed following labs and imaging studies  Micro Results No results found for this or any previous visit (from the past 240 hour(s)).  Radiology Reports Dg Chest 2 View  Result Date: 07/19/2017 CLINICAL DATA:  Acute onset of left-sided weakness.  Cough. EXAM: CHEST  2 VIEW COMPARISON:  Chest radiograph performed 08/29/2015 FINDINGS: The lungs are well-aerated and clear. There is no evidence of focal opacification, pleural effusion or pneumothorax. The heart is borderline enlarged. No acute  osseous abnormalities are seen. IMPRESSION: Borderline cardiomegaly.  Lungs remain grossly clear. Electronically Signed   By: Garald Balding M.D.   On: 07/19/2017 01:30   Ct Head Wo Contrast  Result Date: 07/18/2017 CLINICAL DATA:  High blood pressure. Focal neuro deficits, suspect stroke EXAM: CT HEAD WITHOUT CONTRAST TECHNIQUE: Contiguous axial images were obtained from the base of the skull through the vertex without intravenous contrast. COMPARISON:  May 10, 2016 FINDINGS: Brain: No evidence of acute infarction, hemorrhage, hydrocephalus, extra-axial collection or mass lesion/mass effect. There is chronic diffuse atrophy. Chronic bilateral periventricular white matter small vessel ischemic changes identified. 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 Vascular: No hyperdense vessel or unexpected calcification. Skull: Normal. Negative for fracture or focal lesion. Sinuses/Orbits: No acute finding. Other: None. IMPRESSION: No focal acute intracranial abnormality identified. Chronic diffuse atrophy. Chronic bilateral periventricular white matter small vessel ischemic change. Electronically Signed   By: Abelardo Diesel M.D.   On: 07/18/2017 18:01   Mr Jodene Nam Head Wo Contrast  Result Date: 07/19/2017 CLINICAL DATA:  Left-sided weakness. Acute right pontine infarct and advanced white matter disease on MRI. EXAM: MRA HEAD WITHOUT CONTRAST TECHNIQUE: Angiographic images of the Circle of Willis were obtained using MRA technique without intravenous contrast. COMPARISON:  None. FINDINGS: There is mild-to-moderate motion artifact despite repeated imaging. The visualized distal vertebral arteries are widely patent to the basilar with the left being slightly dominant. Patent PICA and SCA origins are identified bilaterally. The basilar artery is widely patent. There is a small right posterior communicating artery. The PCAs are patent with evidence of a severe proximal P2 stenosis on the left. The internal  carotid arteries are patent from skullbase to carotid termini with suspected mild right cavernous stenosis, however motion artifact limits assessment. ACAs and MCAs are patent without evidence of significant M1 or right A1 stenosis. The left A1 segment is hypoplastic. No proximal branch occlusion is identified. Assessment for branch vessel stenosis is limited by motion. No aneurysm is identified. IMPRESSION: 1. Motion degraded examination. 2. No large vessel occlusion or evidence of flow limiting proximal anterior circulation stenosis. 3. Severe proximal left P2 stenosis. Electronically Signed   By: Logan Bores M.D.   On: 07/19/2017 07:04   Mr Brain W And Wo Contrast  Result Date: 07/18/2017 CLINICAL DATA:  52 y/o  F; left-sided numbness. EXAM: MRI HEAD WITHOUT AND WITH CONTRAST TECHNIQUE: Multiplanar, multiecho pulse sequences of the brain and surrounding structures were obtained without and with intravenous contrast. CONTRAST:  59mL MULTIHANCE GADOBENATE DIMEGLUMINE 529 MG/ML IV SOLN COMPARISON:  07/18/2017 CT head.  04/23/2006 MRI head. FINDINGS: Brain: Right posterior paramedian pontine subcentimeter focus of reduced diffusion (series 4, image 13). Numerous foci of T2 FLAIR hyperintense signal abnormality are present throughout subcortical and periventricular white matter. There are lesions within the genu of corpus callosum, patchy foci throughout the pons, bilateral thalamus, brachium pontis, and cerebellar white matter. Cavum septum pellucidum. Mild brain parenchymal volume loss. No abnormal enhancement of the brain. No susceptibility hypointensity to suggest intracranial hemorrhage. Vascular: Normal flow voids.  Skull and upper cervical spine: Normal marrow signal. Sinuses/Orbits: Sphenoid sinus mucosal thickening. Trace right mastoid effusion. Otherwise negative. Other: None. IMPRESSION: Right posterior paramedian pontine focus of reduced diffusion and progressed age advanced white matter lesions.  Findings probably represent an acute/early subacute pontine infarction and chronic microvascular ischemic changes of white matter, particularly in the setting of hypertension or diabetes. Given lesions in corpus callosum and cerebellum, a demyelinating process such as MS should also be considered. Electronically Signed   By: Kristine Garbe M.D.   On: 07/18/2017 22:13   US Renal  Result Date: 07/19/2017 CLINICAL DATA:  Acute onset of renal insufficiency. EXAM: RENAL / URINARY TRACT ULTRASOUND COMPLETE COMPARISON:  None. FINDINGS: Right Kidney: Length: 10.0 cm. Echogenicity within normal limits. No mass or hydronephrosis visualized. Left Kidney: Length: 9.6 cm. Echogenicity within normal limits. A few tiny left renal cysts are noted, the largest of which measures 0.8 cm at the upper pole of the left kidney. No hydronephrosis visualized. Evaluation of the left kidney is somewhat suboptimal due to overlying bowel gas. Bladder: Appears normal for degree of bladder distention. IMPRESSION: 1. No evidence of hydronephrosis. 2. Tiny left renal cysts noted. Electronically Signed   By: Garald Balding M.D.   On: 07/19/2017 03:44     CBC Recent Labs  Lab 07/18/17 1850  WBC 7.2  HGB 12.8  HCT 38.2  PLT 194  MCV 88.0  MCH 29.5  MCHC 33.5  RDW 15.6*  LYMPHSABS 2.5  MONOABS 0.5  EOSABS 0.1  BASOSABS 0.0    Chemistries  Recent Labs  Lab 07/18/17 1850 07/19/17 0556  NA 141 140  K 3.6 3.2*  CL 106 107  CO2 25 24  GLUCOSE 93 90  BUN 33* 33*  CREATININE 1.60* 1.45*  CALCIUM 9.2 9.0  AST 34  --   ALT 13*  --   ALKPHOS 111  --   BILITOT 0.9  --    ------------------------------------------------------------------------------------------------------------------ estimated creatinine clearance is 48.5 mL/min (A) (by C-G formula based on SCr of 1.45 mg/dL (H)). ------------------------------------------------------------------------------------------------------------------ Recent Labs     07/19/17 0556  HGBA1C 5.9*   ------------------------------------------------------------------------------------------------------------------ Recent Labs    07/19/17 0556  CHOL 187  HDL 40*  LDLCALC 105*  TRIG 210*  CHOLHDL 4.7   ------------------------------------------------------------------------------------------------------------------ No results for input(s): TSH, T4TOTAL, T3FREE, THYROIDAB in the last 72 hours.  Invalid input(s): FREET3 ------------------------------------------------------------------------------------------------------------------ No results for input(s): VITAMINB12, FOLATE, FERRITIN, TIBC, IRON, RETICCTPCT in the last 72 hours.  Coagulation profile Recent Labs  Lab 07/19/17 0024  INR 0.96    No results for input(s): DDIMER in the last 72 hours.  Cardiac Enzymes No results for input(s): CKMB, TROPONINI, MYOGLOBIN in the last 168 hours.  Invalid input(s): CK ------------------------------------------------------------------------------------------------------------------ Invalid input(s): POCBNP   CBG: No results for input(s): GLUCAP in the last 168 hours.     Studies: Dg Chest 2 View  Result Date: 07/19/2017 CLINICAL DATA:  Acute onset of left-sided weakness.  Cough. EXAM: CHEST  2 VIEW COMPARISON:  Chest radiograph performed 08/29/2015 FINDINGS: The lungs are well-aerated and clear. There is no evidence of focal opacification, pleural effusion or pneumothorax. The heart is borderline enlarged. No acute osseous abnormalities are seen. IMPRESSION: Borderline cardiomegaly.  Lungs remain grossly clear. Electronically Signed   By: Garald Balding M.D.   On: 07/19/2017 01:30   Ct Head Wo Contrast  Result Date: 07/18/2017 CLINICAL DATA:  High blood pressure. Focal neuro deficits, suspect stroke EXAM: CT HEAD WITHOUT CONTRAST TECHNIQUE: Contiguous axial  images were obtained from the base of the skull through the vertex without intravenous  contrast. COMPARISON:  May 10, 2016 FINDINGS: Brain: No evidence of acute infarction, hemorrhage, hydrocephalus, extra-axial collection or mass lesion/mass effect. There is chronic diffuse atrophy. Chronic bilateral periventricular white matter small vessel ischemic changes identified. 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 Vascular: No hyperdense vessel or unexpected calcification. Skull: Normal. Negative for fracture or focal lesion. Sinuses/Orbits: No acute finding. Other: None. IMPRESSION: No focal acute intracranial abnormality identified. Chronic diffuse atrophy. Chronic bilateral periventricular white matter small vessel ischemic change. Electronically Signed   By: Abelardo Diesel M.D.   On: 07/18/2017 18:01   Mr Jodene Nam Head Wo Contrast  Result Date: 07/19/2017 CLINICAL DATA:  Left-sided weakness. Acute right pontine infarct and advanced white matter disease on MRI. EXAM: MRA HEAD WITHOUT CONTRAST TECHNIQUE: Angiographic images of the Circle of Willis were obtained using MRA technique without intravenous contrast. COMPARISON:  None. FINDINGS: There is mild-to-moderate motion artifact despite repeated imaging. The visualized distal vertebral arteries are widely patent to the basilar with the left being slightly dominant. Patent PICA and SCA origins are identified bilaterally. The basilar artery is widely patent. There is a small right posterior communicating artery. The PCAs are patent with evidence of a severe proximal P2 stenosis on the left. The internal carotid arteries are patent from skullbase to carotid termini with suspected mild right cavernous stenosis, however motion artifact limits assessment. ACAs and MCAs are patent without evidence of significant M1 or right A1 stenosis. The left A1 segment is hypoplastic. No proximal branch occlusion is identified. Assessment for branch vessel stenosis is limited by motion. No aneurysm is identified. IMPRESSION: 1. Motion degraded  examination. 2. No large vessel occlusion or evidence of flow limiting proximal anterior circulation stenosis. 3. Severe proximal left P2 stenosis. Electronically Signed   By: Logan Bores M.D.   On: 07/19/2017 07:04   Mr Brain W And Wo Contrast  Result Date: 07/18/2017 CLINICAL DATA:  52 y/o  F; left-sided numbness. EXAM: MRI HEAD WITHOUT AND WITH CONTRAST TECHNIQUE: Multiplanar, multiecho pulse sequences of the brain and surrounding structures were obtained without and with intravenous contrast. CONTRAST:  77mL MULTIHANCE GADOBENATE DIMEGLUMINE 529 MG/ML IV SOLN COMPARISON:  07/18/2017 CT head.  04/23/2006 MRI head. FINDINGS: Brain: Right posterior paramedian pontine subcentimeter focus of reduced diffusion (series 4, image 13). Numerous foci of T2 FLAIR hyperintense signal abnormality are present throughout subcortical and periventricular white matter. There are lesions within the genu of corpus callosum, patchy foci throughout the pons, bilateral thalamus, brachium pontis, and cerebellar white matter. Cavum septum pellucidum. Mild brain parenchymal volume loss. No abnormal enhancement of the brain. No susceptibility hypointensity to suggest intracranial hemorrhage. Vascular: Normal flow voids. Skull and upper cervical spine: Normal marrow signal. Sinuses/Orbits: Sphenoid sinus mucosal thickening. Trace right mastoid effusion. Otherwise negative. Other: None. IMPRESSION: Right posterior paramedian pontine focus of reduced diffusion and progressed age advanced white matter lesions. Findings probably represent an acute/early subacute pontine infarction and chronic microvascular ischemic changes of white matter, particularly in the setting of hypertension or diabetes. Given lesions in corpus callosum and cerebellum, a demyelinating process such as MS should also be considered. Electronically Signed   By: Kristine Garbe M.D.   On: 07/18/2017 22:13   US Renal  Result Date: 07/19/2017 CLINICAL DATA:   Acute onset of renal insufficiency. EXAM: RENAL / URINARY TRACT ULTRASOUND COMPLETE COMPARISON:  None. FINDINGS: Right Kidney: Length: 10.0 cm. Echogenicity within normal  limits. No mass or hydronephrosis visualized. Left Kidney: Length: 9.6 cm. Echogenicity within normal limits. A few tiny left renal cysts are noted, the largest of which measures 0.8 cm at the upper pole of the left kidney. No hydronephrosis visualized. Evaluation of the left kidney is somewhat suboptimal due to overlying bowel gas. Bladder: Appears normal for degree of bladder distention. IMPRESSION: 1. No evidence of hydronephrosis. 2. Tiny left renal cysts noted. Electronically Signed   By: Garald Balding M.D.   On: 07/19/2017 03:44      Lab Results  Component Value Date   HGBA1C 5.9 (H) 07/19/2017   Lab Results  Component Value Date   LDLCALC 105 (H) 07/19/2017   CREATININE 1.45 (H) 07/19/2017       Scheduled Meds: .  stroke: mapping our early stages of recovery book   Does not apply Once  . aspirin  300 mg Rectal Daily   Or  . aspirin  325 mg Oral Daily  . atorvastatin  40 mg Oral q1800  . enoxaparin (LOVENOX) injection  40 mg Subcutaneous QHS  . hydrochlorothiazide  12.5 mg Oral BID  . metoprolol tartrate  25 mg Oral BID  . potassium chloride  40 mEq Oral Once  . verapamil  40 mg Oral BID   Continuous Infusions:   LOS: 1 day    Time spent: >30 MINS    Reyne Dumas  [facility] Pager [phone]. If 7PM-7AM, please contact night-coverage at [email or web address], password [email or web address] 07/20/2017, 11:35 AM  LOS: 1 day

## 2017-07-20 NOTE — Consult Note (Signed)
Cardiology Consultation:   Patient ID: Karen Ochoa; [id number]; 06/23/1966   Admit date: 07/18/2017 Date of Consult: 07/20/2017  Primary Care Provider: Patient, No Pcp Per Primary Cardiologist: n/a Primary Electrophysiologist:  n/a   Patient Profile:   Karen Ochoa is a 52 y.o. female with a hx of HTN and current admission for CVA who is being seen today for the evaluation of acute systolic heart failure at the request of Dr Allyson Sabal.  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 As part of work she had an echo which showed an LVEF of 30-35%, a new finding for the patient.  She denies any significant SOB, DOE, LE edema. No chest pain. She has not seen a pcp in a very long time, reports she was getting her bp meds from urgent care and up until the last month had been taking them, but due to costs stopped.    UDS neg, WBC 7.2, Hgb 12.8, Plt 194, K 3.6, Cr 1.6 CXR borederline cardiomegaly, clear lungs Jan 2019 echo: LVEF 30-35%, diffuse hypokinesis, grade I diastolic dysfunction, severe concentric LVH. Recs for cardiac MRI to differentiate infiltrative vs HTN CM. Walls approx 13-14 mm.  EKG SR, LVH   Past Medical History:  Diagnosis Date  . Fibroids   . Hx MRSA infection   . Hx-TIA (transient ischemic attack)    permanent insomnia  . Hypertension   . Insomnia due to medical condition     Past Surgical History:  Procedure Laterality Date  . WISDOM TOOTH EXTRACTION        Inpatient Medications: Scheduled Meds: .  stroke: mapping our early stages of recovery book   Does not apply Once  . aspirin  300 mg Rectal Daily   Or  . aspirin  325 mg Oral Daily  . atorvastatin  40 mg Oral q1800  . enoxaparin (LOVENOX) injection  40 mg Subcutaneous QHS  . hydrochlorothiazide  12.5 mg Oral BID  . metoprolol tartrate  25 mg Oral BID  .  verapamil  40 mg Oral BID   Continuous Infusions:  PRN Meds: acetaminophen **OR** acetaminophen (TYLENOL) oral liquid 160 mg/5 mL **OR** acetaminophen, hydrALAZINE  Allergies:    Allergies  Allergen Reactions  . Penicillins Anaphylaxis    Has patient had a PCN reaction causing immediate rash, facial/tongue/throat swelling, SOB or lightheadedness with hypotension: Yes Has patient had a PCN reaction causing severe rash involving mucus membranes or skin necrosis: Yes Has patient had a PCN reaction that required hospitalization Yes Has patient had a PCN reaction occurring within the last 10 years: No If all of the above answers are "NO", then may proceed with Cephalosporin use.   . Sulfonamide Derivatives Anaphylaxis  . Clonidine Derivatives Other (See Comments)    Patient passed out shortly after ingesting this at a doctor's office on 05/08/16 (was taken in conjunction with a tablet of Labetalol.)  . Flagyl [Metronidazole] Swelling and Other (See Comments)    Skin blisters  . Labetalol Other (See Comments)    Patient passed out shortly after ingesting this at a doctor's office on 05/08/16 (was taken in conjunction with a tablet of Clonidine.)    Social History:   Social History   Socioeconomic History  . Marital status: Single    Spouse name: Not on file  . Number of children:  Not on file  . Years of education: Not on file  . Highest education level: Not on file  Social Needs  . Financial resource strain: Not on file  . Food insecurity - worry: Not on file  . Food insecurity - inability: Not on file  . Transportation needs - medical: Not on file  . Transportation needs - non-medical: Not on file  Occupational History  . Not on file  Tobacco Use  . Smoking status: Current Every Day Smoker    Packs/day: 1.00    Types: Cigarettes  . Smokeless tobacco: Never Used  Substance and Sexual Activity  . Alcohol use: Yes    Comment: weekends  . Drug use: No  . Sexual activity:  Yes    Birth control/protection: None  Other Topics Concern  . Not on file  Social History Narrative  . Not on file    Family History:    Family History  Problem Relation Age of Onset  . Hypertension Mother   . Diabetes Sister   . Asthma Brother   . Diabetes Brother   . Anesthesia problems Neg Hx      ROS:  Please see the history of present illness.  ROS  All other ROS reviewed and negative.     Physical Exam/Data:   Vitals:   07/19/17 2049 07/20/17 0228 07/20/17 1047 07/20/17 1423  BP: (!) 157/101 (!) 164/99 (!) 135/107 (!) 151/104  Pulse: 75 72 62 78  Resp: 19 19 18 18   Temp:  97.7 F (36.5 C) 97.7 F (36.5 C) 97.8 F (36.6 C)  TempSrc:  Oral Oral Oral  SpO2: 98% 100% 98% 100%  Weight:      Height:        Intake/Output Summary (Last 24 hours) at 07/20/2017 1612 Last data filed at 07/20/2017 1400 Gross per 24 hour  Intake 480 ml  Output -  Net 480 ml   Filed Weights   07/18/17 1418 07/19/17 2000  Weight: 165 lb (74.8 kg) 173 lb 11.6 oz (78.8 kg)   Body mass index is 27.21 kg/m.  General:  Well nourished, well developed, in no acute distress HEENT: normal Lymph: no adenopathy Neck: no JVD Endocrine:  No thryomegaly Vascular: No carotid bruits; FA pulses 2+ bilaterally without bruits  Cardiac:  normal S1, S2; RRR; no murmur  Lungs:  clear to auscultation bilaterally, no wheezing, rhonchi or rales  Abd: soft, nontender, no hepatomegaly  Ext: no edema Musculoskeletal:  No deformities, BUE and BLE strength normal and equal Skin: warm and dry  Neuro:  CNs 2-12 intact, no focal abnormalities noted Psych:  Normal affect    Laboratory Data:  Chemistry Recent Labs  Lab 07/18/17 1850 07/19/17 0556  NA 141 140  K 3.6 3.2*  CL 106 107  CO2 25 24  GLUCOSE 93 90  BUN 33* 33*  CREATININE 1.60* 1.45*  CALCIUM 9.2 9.0  GFRNONAA 36* 40*  GFRAA 41* 47*  ANIONGAP 10 9    Recent Labs  Lab 07/18/17 1850  PROT 6.9  ALBUMIN 3.8  AST 34  ALT 13*    ALKPHOS 111  BILITOT 0.9   Hematology Recent Labs  Lab 07/18/17 1850  WBC 7.2  RBC 4.34  HGB 12.8  HCT 38.2  MCV 88.0  MCH 29.5  MCHC 33.5  RDW 15.6*  PLT 194   Cardiac EnzymesNo results for input(s): TROPONINI in the last 168 hours. No results for input(s): TROPIPOC in the last 168 hours.  BNPNo  results for input(s): BNP, PROBNP in the last 168 hours.  DDimer No results for input(s): DDIMER in the last 168 hours.  Radiology/Studies:  Dg Chest 2 View  Result Date: 07/19/2017 CLINICAL DATA:  Acute onset of left-sided weakness.  Cough. EXAM: CHEST  2 VIEW COMPARISON:  Chest radiograph performed 08/29/2015 FINDINGS: The lungs are well-aerated and clear. There is no evidence of focal opacification, pleural effusion or pneumothorax. The heart is borderline enlarged. No acute osseous abnormalities are seen. IMPRESSION: Borderline cardiomegaly.  Lungs remain grossly clear. Electronically Signed   By: Garald Balding M.D.   On: 07/19/2017 01:30   Ct Head Wo Contrast  Result Date: 07/18/2017 CLINICAL DATA:  High blood pressure. Focal neuro deficits, suspect stroke EXAM: CT HEAD WITHOUT CONTRAST TECHNIQUE: Contiguous axial images were obtained from the base of the skull through the vertex without intravenous contrast. COMPARISON:  May 10, 2016 FINDINGS: Brain: No evidence of acute infarction, hemorrhage, hydrocephalus, extra-axial collection or mass lesion/mass effect. There is chronic diffuse atrophy. Chronic bilateral periventricular white matter small vessel ischemic changes identified. 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 Vascular: No hyperdense vessel or unexpected calcification. Skull: Normal. Negative for fracture or focal lesion. Sinuses/Orbits: No acute finding. Other: None. IMPRESSION: No focal acute intracranial abnormality identified. Chronic diffuse atrophy. Chronic bilateral periventricular white matter small vessel ischemic change. Electronically Signed   By:  Abelardo Diesel M.D.   On: 07/18/2017 18:01   Mr Jodene Nam Head Wo Contrast  Result Date: 07/19/2017 CLINICAL DATA:  Left-sided weakness. Acute right pontine infarct and advanced white matter disease on MRI. EXAM: MRA HEAD WITHOUT CONTRAST TECHNIQUE: Angiographic images of the Circle of Willis were obtained using MRA technique without intravenous contrast. COMPARISON:  None. FINDINGS: There is mild-to-moderate motion artifact despite repeated imaging. The visualized distal vertebral arteries are widely patent to the basilar with the left being slightly dominant. Patent PICA and SCA origins are identified bilaterally. The basilar artery is widely patent. There is a small right posterior communicating artery. The PCAs are patent with evidence of a severe proximal P2 stenosis on the left. The internal carotid arteries are patent from skullbase to carotid termini with suspected mild right cavernous stenosis, however motion artifact limits assessment. ACAs and MCAs are patent without evidence of significant M1 or right A1 stenosis. The left A1 segment is hypoplastic. No proximal Hildegard Hlavac occlusion is identified. Assessment for Nikka Hakimian vessel stenosis is limited by motion. No aneurysm is identified. IMPRESSION: 1. Motion degraded examination. 2. No large vessel occlusion or evidence of flow limiting proximal anterior circulation stenosis. 3. Severe proximal left P2 stenosis. Electronically Signed   By: Logan Bores M.D.   On: 07/19/2017 07:04   Mr Brain W And Wo Contrast  Result Date: 07/18/2017 CLINICAL DATA:  52 y/o  F; left-sided numbness. EXAM: MRI HEAD WITHOUT AND WITH CONTRAST TECHNIQUE: Multiplanar, multiecho pulse sequences of the brain and surrounding structures were obtained without and with intravenous contrast. CONTRAST:  3mL MULTIHANCE GADOBENATE DIMEGLUMINE 529 MG/ML IV SOLN COMPARISON:  07/18/2017 CT head.  04/23/2006 MRI head. FINDINGS: Brain: Right posterior paramedian pontine subcentimeter focus of  reduced diffusion (series 4, image 13). Numerous foci of T2 FLAIR hyperintense signal abnormality are present throughout subcortical and periventricular white matter. There are lesions within the genu of corpus callosum, patchy foci throughout the pons, bilateral thalamus, brachium pontis, and cerebellar white matter. Cavum septum pellucidum. Mild brain parenchymal volume loss. No abnormal enhancement of the brain. No susceptibility hypointensity to suggest  intracranial hemorrhage. Vascular: Normal flow voids. Skull and upper cervical spine: Normal marrow signal. Sinuses/Orbits: Sphenoid sinus mucosal thickening. Trace right mastoid effusion. Otherwise negative. Other: None. IMPRESSION: Right posterior paramedian pontine focus of reduced diffusion and progressed age advanced white matter lesions. Findings probably represent an acute/early subacute pontine infarction and chronic microvascular ischemic changes of white matter, particularly in the setting of hypertension or diabetes. Given lesions in corpus callosum and cerebellum, a demyelinating process such as MS should also be considered. Electronically Signed   By: Kristine Garbe M.D.   On: 07/18/2017 22:13   US Renal  Result Date: 07/19/2017 CLINICAL DATA:  Acute onset of renal insufficiency. EXAM: RENAL / URINARY TRACT ULTRASOUND COMPLETE COMPARISON:  None. FINDINGS: Right Kidney: Length: 10.0 cm. Echogenicity within normal limits. No mass or hydronephrosis visualized. Left Kidney: Length: 9.6 cm. Echogenicity within normal limits. A few tiny left renal cysts are noted, the largest of which measures 0.8 cm at the upper pole of the left kidney. No hydronephrosis visualized. Evaluation of the left kidney is somewhat suboptimal due to overlying bowel gas. Bladder: Appears normal for degree of bladder distention. IMPRESSION: 1. No evidence of hydronephrosis. 2. Tiny left renal cysts noted. Electronically Signed   By: Garald Balding M.D.   On:  07/19/2017 03:44    Assessment and Plan:   1. 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 She has not seen doctors regularly for bp management. Other possibilities include ischemic CM, given her concentric wall thickening possibility for infiltrative CM and could consider cardiac MRI to distinguish. Other possibility is stress induced CM in setting of CVA. - she denies any CHF symptoms, appears euvolemic.  - from chart review no plans for permissive HTN at this point, we will change medication regimen to CHF regimen. Change lopressor to coreg 12.5 mg bid, discontinue verapamil in setting of systolic dysfunction, low dose lisinopril 5mg  daily in setting of renal dysfunction.  - patient without insurance, dealing with social work to pay for current admission including multiple neurological tests. Cardiac testing at this time is not urgent, and can be done as outpatient once patient as solidfied funding. Would consider outpatient LHC/RHC and possible cardiac MRI. For now focus on optimzing medical therapy and bp control    For questions or updates, please contact [facility] Please consult [email or web address] for contact info under Cardiology/STEMI.   Merrily Pew, MD  07/20/2017 4:12 PM

## 2017-07-20 NOTE — Patient Instructions (Signed)
ABDUCTION: Standing (Active)    Stand, feet flat. Lift right leg out to side. Use ___ lbs. Complete _1__ sets of _10__ repetitions. Perform _2__ sessions per day.  http://gtsc.exer.us/110   Copyright  VHI. All rights reserved.  EXTENSION: Standing (Active)    Stand, both feet flat. Draw right leg behind body as far as possible. Use ___ lbs. Complete _1__ sets of _10__ repetitions. Perform __2_ sessions per day.  http://gtsc.exer.us/76   Copyright  VHI. All rights reserved.  FLEXION: Standing - Stable (Active)    Stand, both feet flat. Bend right knee, bringing heel toward buttocks. Use ___ lbs. Complete _1__ sets of __10_ repetitions. Perform _2__ sessions per day.  http://gtsc.exer.us/240   Copyright  VHI. All rights reserved.  EXTENSION: Standing - Stable (Active)    Stand, both feet flat. Rise up onto toes. Use ___ lbs. Complete _1__ sets of _10__ repetitions. Perform _2__ sessions per day.  Copyright  VHI. All rights reserved.  Half Squat to Chair    Stand with feet shoulder width apart. Push buttocks backward and lower slowly, touching chair lightly and returning to standing position. Complete __1_ sets of _10__ repetitions. Perform _2__ sessions per day.  http://gtsc.exer.us/436   Copyright  VHI. All rights reserved.  Dumbbell Squat: Stable - Half (Active)    From erect position, head up and back flat, push buttocks backward, lower slowly until thighs are midway to parallel to floor. Rise to erect position. Use ___ lbs. Complete __1_ sets of _10__ repetitions. Perform __2_ sessions per day.  http://gtsc.exer.us/474   Copyright  VHI. All rights reserved.

## 2017-07-20 NOTE — Progress Notes (Signed)
Patient arrived via EMS from [facility] with acute/subacute cva. Alert and oriented.

## 2017-07-20 NOTE — Progress Notes (Signed)
Telemetry Box 29 applied, central monitoring called with 2nd verifyer. `Patient states she feels like she has a cold. Has been coughing for about a month. Safety maintained. Will continue to monitor.

## 2017-07-20 NOTE — Progress Notes (Signed)
SLP Cancellation Note  Patient Details Name: RANDY WHITENER MRN: [id number] DOB: 1965-12-13   Cancelled treatment:       Reason Eval/Treat Not Completed: Patient at procedure or test/unavailable. Attempted x2.    Viral Schramm, Katherene Ponto 07/20/2017, 3:58 PM

## 2017-07-21 DIAGNOSIS — I429 Cardiomyopathy, unspecified: Secondary | ICD-10-CM

## 2017-07-21 DIAGNOSIS — I739 Peripheral vascular disease, unspecified: Secondary | ICD-10-CM

## 2017-07-21 DIAGNOSIS — I255 Ischemic cardiomyopathy: Secondary | ICD-10-CM

## 2017-07-21 DIAGNOSIS — I779 Disorder of arteries and arterioles, unspecified: Secondary | ICD-10-CM | POA: Diagnosis present

## 2017-07-21 DIAGNOSIS — N183 Chronic kidney disease, stage 3 unspecified: Secondary | ICD-10-CM | POA: Diagnosis present

## 2017-07-21 DIAGNOSIS — I5022 Chronic systolic (congestive) heart failure: Secondary | ICD-10-CM

## 2017-07-21 DIAGNOSIS — I6300 Cerebral infarction due to thrombosis of unspecified precerebral artery: Secondary | ICD-10-CM

## 2017-07-21 LAB — COMPREHENSIVE METABOLIC PANEL
ALK PHOS: 96 U/L (ref 38–126)
ALT: 15 U/L (ref 14–54)
ANION GAP: 10 (ref 5–15)
AST: 20 U/L (ref 15–41)
Albumin: 3.2 g/dL — ABNORMAL LOW (ref 3.5–5.0)
BUN: 28 mg/dL — ABNORMAL HIGH (ref 6–20)
CALCIUM: 8.7 mg/dL — AB (ref 8.9–10.3)
CO2: 23 mmol/L (ref 22–32)
Chloride: 106 mmol/L (ref 101–111)
Creatinine, Ser: 1.37 mg/dL — ABNORMAL HIGH (ref 0.44–1.00)
GFR calc non Af Amer: 43 mL/min — ABNORMAL LOW (ref >60)
GFR, EST AFRICAN AMERICAN: 50 mL/min — AB (ref >60)
Glucose, Bld: 123 mg/dL — ABNORMAL HIGH (ref 65–99)
Potassium: 3.4 mmol/L — ABNORMAL LOW (ref 3.5–5.1)
Sodium: 139 mmol/L (ref 135–145)
TOTAL PROTEIN: 6 g/dL — AB (ref 6.5–8.1)
Total Bilirubin: 0.5 mg/dL (ref 0.3–1.2)

## 2017-07-21 MED ORDER — CARVEDILOL 12.5 MG PO TABS: 12.5000 mg | ORAL_TABLET | Freq: Two times a day (BID) | ORAL | 1 refills | Status: DC

## 2017-07-21 MED ORDER — ATORVASTATIN CALCIUM 40 MG PO TABS: 40.0000 mg | ORAL_TABLET | Freq: Every day | ORAL | 2 refills | Status: DC

## 2017-07-21 MED ORDER — ASPIRIN 325 MG PO TABS: 325.0000 mg | ORAL_TABLET | Freq: Every day | ORAL | 3 refills | Status: DC

## 2017-07-21 MED ORDER — LISINOPRIL 5 MG PO TABS: 5.0000 mg | ORAL_TABLET | Freq: Every day | ORAL | 2 refills | Status: DC

## 2017-07-21 MED ORDER — LOSARTAN POTASSIUM 50 MG PO TABS
25.0000 mg | ORAL_TABLET | Freq: Every day | ORAL | Status: DC
  Administered 2017-07-21: 25 mg via ORAL
  Filled 2017-07-21: qty 1

## 2017-07-21 MED ORDER — LOSARTAN POTASSIUM 25 MG PO TABS: 25.0000 mg | ORAL_TABLET | Freq: Every day | ORAL | 0 refills | Status: DC

## 2017-07-21 MED ORDER — FUROSEMIDE 20 MG PO TABS: 20.0000 mg | ORAL_TABLET | Freq: Every day | ORAL | 11 refills | Status: DC

## 2017-07-21 MED ORDER — LACTULOSE 10 GM/15ML PO SOLN: 20.0000 g | Freq: Every day | ORAL | Status: DC | PRN

## 2017-07-21 MED ORDER — POLYETHYLENE GLYCOL 3350 17 G PO PACK: 17.0000 g | PACK | Freq: Every day | ORAL | Status: DC

## 2017-07-21 NOTE — Progress Notes (Signed)
Pt refused to take lisinopril medication this am, pt states she has taken that med in the past and it made her face swell--"made me look like a rabbit" pt stated.  Reported off to next RN coming on at this time.

## 2017-07-21 NOTE — Progress Notes (Signed)
NEUROHOSPITALISTS STROKE TEAM - DAILY PROGRESS NOTE   ADMISSION HISTORY: Karen Ochoa is a 52 y.o. female with a history of hypertension since age 71 as well as long history of smoking who presents with left-sided weakness has been present for the past few days.  She states that she noticed it on Wednesday, it has been persistent ever since.  She noticed that her blood pressure was quite high today and therefore sought care in the emergency department. She denies any history of progressive numbness/weakness or vision loss in the past.  She has had " mini strokes"in the past.  LKW: Wednesday tpa given?: no, out of window  SUBJECTIVE (INTERVAL HISTORY) No family is at the bedside. Patient is found laying in bed in NAD. Overall she feels her condition is slowly improving. Voices no new complaints.  Denies headache.  No new events reported overnight.   OBJECTIVE Lab Results: CBC:  Recent Labs  Lab 07/18/17 1850  WBC 7.2  HGB 12.8  HCT 38.2  MCV 88.0  PLT 194   BMP: Recent Labs  Lab 07/18/17 1850 07/19/17 0556 07/21/17 0755  NA 141 140 139  K 3.6 3.2* 3.4*  CL 106 107 106  CO2 25 24 23   GLUCOSE 93 90 123*  BUN 33* 33* 28*  CREATININE 1.60* 1.45* 1.37*  CALCIUM 9.2 9.0 8.7*   Liver Function Tests:  Recent Labs  Lab 07/18/17 1850 07/21/17 0755  AST 34 20  ALT 13* 15  ALKPHOS 111 96  BILITOT 0.9 0.5  PROT 6.9 6.0*  ALBUMIN 3.8 3.2*   Coagulation Studies:  Recent Labs    07/19/17 0024  APTT 29  INR 0.96   PHYSICAL EXAM Temp:  [97.8 F (36.6 C)-98.3 F (36.8 C)] 98.1 F (36.7 C) (01/02 1011) Pulse Rate:  [69-83] 77 (01/02 1011) Resp:  [18] 18 (01/02 0525) BP: (131-161)/(84-104) 131/88 (01/02 1011) SpO2:  [99 %-100 %] 100 % (01/02 1011) Weight:  [79.8 kg (175 lb 14.8 oz)] 79.8 kg (175 lb 14.8 oz) (01/02 0525) General - Well nourished, well developed, in no apparent distress HEENT-  Normocephalic, Normal  external eye/conjunctiva.  Normal external ears. Normal external nose, mucus membranes and septum.   Cardiovascular - Regular rate and rhythm  Respiratory - Lungs clear bilaterally. No wheezing. Abdomen - soft and non-tender, BS normal Extremities- no edema or cyanosis Neuro: Mental Status: Patient is awake, alert, oriented to person, place, month, year, and situation. Patient is able to give a clear and coherent history. No signs of aphasia or neglect Cranial Nerves: II: Visual Fields are full. Pupils are equal, round, and reactive to light.   III,IV, VI: EOMI without ptosis or diploplia.  V: Facial sensation is symmetric to temperature VII: Slight facial droop on left VIII: hearing is intact to voice X: Uvula elevates symmetrically XI: Shoulder shrug is symmetric. XII: tongue is midline without atrophy or fasciculations.  Motor: Tone is normal. Bulk is normal.  She has mild drift in her left upper extremity, mild 4+/5 weakness of the left arm 4+/5 in the left leg Sensory: Sensation is decreased to light touch on left. Cerebellar: FNF and HKS are intact bilaterally  IMAGING: I have personally reviewed the radiological images below and agree with the radiology interpretations.  Ct Head Wo Contrast Result Date: 07/18/2017 IMPRESSION: No focal acute intracranial abnormality identified. Chronic diffuse atrophy. Chronic bilateral periventricular white matter small vessel ischemic change.   Mr Jodene Nam Head Wo Contrast Result Date: 07/19/2017 IMPRESSION: 1.  Motion degraded examination. 2. No large vessel occlusion or evidence of flow limiting proximal anterior circulation stenosis. 3. Severe proximal left P2 stenosis.   Mr Jeri Cos And Wo Contrast Result Date: 07/18/2017 IMPRESSION: Right posterior paramedian pontine focus of reduced diffusion and progressed age advanced white matter lesions. Findings probably represent an acute/early subacute pontine infarction and chronic microvascular  ischemic changes of white matter, particularly in the setting of hypertension or diabetes. Given lesions in corpus callosum and cerebellum, a demyelinating process such as MS should also be considered.   Echocardiogram:                                              PENDING  B/L Carotid U/S:                                                 Final Interpretation: Right Carotid: There is evidence in the right ICA of a 1-39% stenosis. Left Carotid: There is evidence in the left ICA of a 1-39% stenosis. Vertebrals: Both vertebral arteries were patent with antegrade flow.     IMPRESSION: 52 year old female with history of long-standing uncontrolled hypertension, smoker, history of stroke/TIA in 2007 admitted for mild left-sided weakness for a few days and high blood pressure at home.  She has been off medication for some time due to financial difficulty.  She had a lot of stress recently including breakup with her boyfriend, loss of job, family issues. MRI showed right pontine small infarct as well as severe left matter ischemic changes.  MRI showed left P2 stenosis.  Carotid Doppler negative, however 2D echo showed EF 30-35% with severe concentric LVH concerning for hypertensive versus infiltrative cardiomyopathy.  Her white matter changes most likely due to long-standing uncontrolled hypertension and smoking, less likely for MS or CADASIL.   Acute/early subacute Right pontine infarction  Suspected Etiology: Small vessel disease, acute systolic heart failure Resultant Symptoms: Left-sided weakness Stroke Risk Factors: hypertension and smoking Other Stroke Risk Factors: Advanced age, Hx TIA, Migraines  Outstanding Stroke Work-up Studies:    Workup completed  07/21/2017 ASSESSMENT:   Neuro exam remained stable with left-sided weakness.  Walking in hallway with therapies.  Patient has been seen by cardiology.  She will follow-up as an outpatient for further workup.  Patient likely to be discharged home  today.  She will need close PCP follow-up and neurology follow-up in 6 weeks.  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  Continue full dose aspirin for now and Lipitor 40 mg  Rule out MS Her white matter changes most likely due to long-standing uncontrolled hypertension and smoking, less likely for MS or CADASIL. Follow-up with outpatient neurology, workup if indicated  Cardiomyopathy Outpatient cardiology workup  HYPERTENSION: Stable Permissive hypertension (OK if <220/120) for 24-48 hours post stroke and then gradually normalized within 5-7 days. Long  term BP goal normotensive. May slowly restart home B/P medications after 48 hours Home Meds: HCTZ, metoprolol, verapamil-patient states she has not been taking any of her medications for a few months.  HYPERLIPIDEMIA:    Component Value Date/Time   CHOL 187 07/19/2017 0556   TRIG 210 (H) 07/19/2017 0556   HDL 40 (L) 07/19/2017 0556   CHOLHDL 4.7 07/19/2017 0556   VLDL 42 (H) 07/19/2017 0556   LDLCALC 105 (H) 07/19/2017 0556  Home Meds:  NONE LDL  goal < 70 Started on Lipitor to 40 mg daily Continue statin at discharge  R/O DIABETES: Lab Results  Component Value Date   HGBA1C 5.9 (H) 07/19/2017  HgbA1c goal < 7.0  TOBACCO ABUSE  Current smoker Smoking cessation counseling provided Nicotine patch provided  Other Active Problems: Principal Problem:   Cerebrovascular accident (CVA) ([place]) Active Problems:   Smoker   HTN (hypertension)   History of  TIAs   History of syncope 2010   Renal insufficiency   White matter abnormality on MRI of brain   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 Yes; Fluid consistency: Thin   FAMILY UPDATES: No family at bedside  TEAM UPDATES: Reyne Dumas, MD   Prior Home Stroke Medications:  No antithrombotic  Discharge Stroke Meds:  Please discharge patient on aspirin 325 mg daily and Lipitor 40 mg   Disposition: 01-Home or Self Care Therapy Recs:               None Home Equipment:         Cane, 3 and 1 bedside commode Follow Up:  Follow-up [place] Follow up.   Specialty:  Internal Medicine Why:  You can get a doctor here without insurance Contact information: [place] Follow up.   Why:  You can get a doctor here without insurance.  Once you have an appointment at one of [facility] clinics, you can use this location for your PHARMACY needs and for FINANCIAL COUNSELING. Contact information: [place] [address] [phone]       Rosalin Hawking, MD. Schedule an appointment as soon as possible for a visit in 6 week(s).   Specialty:  Neurology Contact information: [address] [place] [address] [phone]        Nahser, Wonda Cheng, MD Follow up.   Specialty:  Cardiology Why:  office will contact you with an appointment Contact information: [place] [address] [place] [address] [phone]          Patient, No Pcp Per -PCP Follow up in 1-2 weeks   Case Management aware of need   Assessment & plan discussed with with attending physician and they are in agreement.    Renie Ora Stroke Neurology Team 07/21/2017 11:14 AM  ATTENDING NOTE: I reviewed above note and agree with the assessment and plan. I have made any additions or clarifications directly to the above note. Pt was seen and examined.   52 year old female with history of long-standing uncontrolled hypertension, smoker, history of stroke/TIA in 2007 admitted for mild left-sided weakness for a few days and  high blood pressure at home.  She has been off medication for some time due to financial difficulty.  She had a lot of stress recently including breakup with her boyfriend, loss of job, family issues.  MRI showed right pontine small infarct as well as severe left matter ischemic changes.  MRI showed left P2 stenosis.  Carotid Doppler negative, however 2D echo showed EF 30-35% with severe concentric LVH concerning for hypertensive versus infiltrative cardiomyopathy. Cardiology consulted and recommend outpt cardiac cath and possible cardiac MRI. Put on coreg and losartan.  Her white matter changes most likely due to long-standing uncontrolled hypertension and smoking, less likely for MS or CADASIL.  LDL 105, A1c 5.9, UDS negative.  On aspirin 325 and Lipitor 40 now.  BP much improved after BP meds. Case manager is working with her for financial support.  Patient educated on medication compliance and smoking cessation. Need to repeat TTE in 3 months as outpt.   Neurology will sign off. Please call with questions. Pt will follow up with Dr. Erlinda Hong at [facility] in about 6 weeks. Thanks for the consult.  Rosalin Hawking, MD PhD Stroke Neurology 07/21/2017 1:57 PM   To contact Stroke Continuity provider, please refer to [email or web address] After hours, contact General Neurology

## 2017-07-21 NOTE — Progress Notes (Signed)
Pt discharge education and instructions completed with pt and family at bedside; both voices understanding and denies any questions. Pt IV and telemetry removed; pt discharge home with family to transport her home. Pt to pick electronically sent prescriptions from preferred pharmacy on file. Pt waiting on home DME equipments. Will transport pt via wheelchair with belongings to the side. Delia Heady RN

## 2017-07-21 NOTE — Evaluation (Signed)
Speech Language Pathology Evaluation Patient Details Name: Karen Ochoa MRN: [id number] DOB: 10-12-1965 Today's Date: 07/21/2017 Time: 4098-1191 SLP Time Calculation (min) (ACUTE ONLY): 15 min  Problem List:  Patient Active Problem List   Diagnosis Date Noted  . 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 Chronic renal insufficiency, stage III (moderate) ([place]) 07/21/2017  . Carotid artery disease ([place]) 07/21/2017  . White matter abnormality on MRI of brain   . Cerebrovascular accident (CVA) ([place]) 07/19/2017  . Renal insufficiency 07/19/2017  . Smoker 06/18/2009  . History of syncope 2010 06/18/2009  . INSOMNIA, CHRONIC 05/27/2009  . HTN (hypertension) 05/27/2009  . History of TIAs 05/27/2009  . HEADACHE, CHRONIC, HX OF 05/27/2009   Past Medical History:  Past Medical History:  Diagnosis Date  . Asthma 2017  . Fibroids   . GERD (gastroesophageal reflux disease)   . Headache    "weekly" (07/20/2017)  . High cholesterol 07/18/2017  . Hx MRSA infection   . Hypertension   . Insomnia due to medical condition   . Stroke ([place]) 07/18/2017  . TIA (transient ischemic attack)    "I've had several"   Past Surgical History:  Past Surgical History:  Procedure Laterality Date  . WISDOM TOOTH EXTRACTION     HPI:  52 yo female tranferred from [facility] ED to [facility] after admission with left sided weakness. Pt found to have acute right pontine cva.  Speech evaluation ordered.  Pt admits to h/o CVA and states her vision changed with neuro events as she reports she now uses glasses.     Assessment / Plan / Recommendation Clinical Impression  Pt with fluent expressive and receptive language and speech abilities.  MOCA 7.1 administered to pt resulting in her scoring 25/30 - with normal being 26/30.  Pt's strengths include attention/mental math, orientation, visuospatial tasks.  Areas of challenges included verbal fluency (naming 10 items in 60 seconds) and delayed recall  (recalled 3/5 Independently, 2/5 with multiple choice cue).  Suspect pt's cognitive linguistic skills are near baseline and she confirms.  No SLP follow up indicated at this time, informed pt of results/recommendations and she is agreeable to plan.     SLP Assessment  SLP Recommendation/Assessment: Patient does not need any further Speech Lanaguage Pathology Services    Follow Up Recommendations  None    Frequency and Duration     n/a      SLP Evaluation Cognition  Overall Cognitive Status: Within Functional Limits for tasks assessed Arousal/Alertness: Awake/alert Orientation Level: Oriented X4 Attention: Sustained Sustained Attention: Appears intact Memory: Impaired Memory Impairment: Retrieval deficit(recalled 3/5 words independently, 2/5 with multiple choice cue) Awareness: Appears intact Problem Solving: Appears intact Executive Function: Reasoning;Sequencing;Organizing;Decision Making;Initiating;Self Monitoring;Self Correcting Reasoning: Appears intact Sequencing: Appears intact Organizing: Appears intact Decision Making: Appears intact Initiating: Appears intact Self Monitoring: Appears intact Self Correcting: Appears intact Behaviors: Other (comment)(? flat affect) Safety/Judgment: Appears intact       Comprehension  Auditory Comprehension Overall Auditory Comprehension: Appears within functional limits for tasks assessed Yes/No Questions: Not tested Commands: Within Functional Limits Conversation: Complex Interfering Components: Processing speed EffectiveTechniques: Extra processing time;Repetition Visual Recognition/Discrimination Discrimination: Within Function Limits Reading Comprehension Reading Status: Not tested    Expression Expression Primary Mode of Expression: Verbal Verbal Expression Overall Verbal Expression: Appears within functional limits for tasks assessed Initiation: No impairment Level of Generative/Spontaneous Verbalization:  Sentence Repetition: No impairment Naming: Impairment(2/3 animals named correctly, 1/3 with first letter cue) Pragmatics: No impairment Non-Verbal Means  of Communication: Not applicable Written Expression Dominant Hand: Right Written Expression: Within Functional Limits   Oral / Motor  Oral Motor/Sensory Function Overall Oral Motor/Sensory Function: Mild impairment Lingual Strength: (slight lingual deviation to right upon protrusion, although pt with right sided cva) Motor Speech Respiration: Within functional limits Resonance: Within functional limits Articulation: Within functional limitis Intelligibility: Intelligible Motor Planning: Witnin functional limits   GO                    Karen Ochoa 07/21/2017, 9:48 AM  Luanna Salk, [place] [facility] SLP [phone]

## 2017-07-21 NOTE — Care Management Note (Signed)
Case Management Note  Patient Details  Name: WAFAA DEEMER MRN: [id number] Date of Birth: [birth year]  Subjective/Objective:   Pt in with CVA. She is from home with her cousin. Pt without a PCP or insurance.                Action/Plan: CM met with the patient and she was very interested in attending one of [place]. CM was able to get her an appointment at [facility]. Information on the AVS.  Pt encouraged to use [facility] pharmacy for her medications. Pt with orders for cane and 3 in 1. Dan with [facility] notified and will deliver to the room under [facility].  Pt states she has transportation home.   Expected Discharge Date:  07/21/17               Expected Discharge Plan:  Home/Self Care  In-House Referral:     Discharge planning [facility], CM Consult, Medication Assistance  Post Acute Care Choice:  Durable Medical Equipment Choice offered to:  Patient  DME Arranged:  3-N-1, Kasandra Knudsen DME Agency:  [place]:    [place]:     Status of Service:  Completed, signed off  If discussed at [place] of Stay Meetings, dates discussed:    Additional Comments:  Pollie Friar, RN 07/21/2017, 1:14 PM

## 2017-07-21 NOTE — Progress Notes (Signed)
Physical Therapy Treatment Patient Details Name: Karen Ochoa MRN: [id number] DOB: 10-27-1965 Today's Date: 07/21/2017    History of Present Illness Karen Ochoa is a 52 y.o. female with a history of hypertension since age 70 as well as long history of smoking who presents with left-sided weakness has been present for the past few days.  MRI positive: Right posterior paramedian pontine focus of reduced diffusion and progressed age advanced white matter lesions. Findings probably represent an acute/early subacute pontine infarction and chronic microvascular ischemic changes of white matter, particularly in the setting of hypertension or diabetes. Given lesions in corpus callosum and cerebellum, a demyelinating process such as MS should also be considered.     PT Comments    Patient progressing with safety with cane this session and with HEP performance.  Continue to feel she can d/c home with cousin to provide S level A and with HEP.   Follow Up Recommendations  No PT follow up     Equipment Recommendations  Cane    Recommendations for Other Services       Precautions / Restrictions Precautions Precautions: Fall Restrictions Weight Bearing Restrictions: No    Mobility  Bed Mobility Overal bed mobility: Modified Independent                Transfers Overall transfer level: Modified independent                  Ambulation/Gait Ambulation/Gait assistance: Min guard;Supervision Ambulation Distance (Feet): 150 Feet Assistive device: None;Straight cane Gait Pattern/deviations: Decreased stride length;Decreased stance time - left     General Gait Details: unsteady initially with minguard for balance without cane, after practicing steps in gym used cane to return to room with S for safety and cues for sequence   Stairs Stairs: Yes   Stair Management: No rails;With cane Number of Stairs: 2 General stair comments: cues for pattern and cane placement minguard for  safety  Wheelchair Mobility    Modified Rankin (Stroke Patients Only) Modified Rankin (Stroke Patients Only) Pre-Morbid Rankin Score: No symptoms Modified Rankin: Moderate disability     Balance Overall balance assessment: Needs assistance   Sitting balance-Leahy Scale: Good       Standing balance-Leahy Scale: Good Standing balance comment: good static balance, some decreased dynamic balance                            Cognition Arousal/Alertness: Awake/alert Behavior During Therapy: Flat affect;WFL for tasks assessed/performed Overall Cognitive Status: Within Functional Limits for tasks assessed                                 General Comments: Continues to demosntrate decreased problem solving. Needing Min VCs for reviewing and performing excercises      Exercises Hand Exercises Digit Composite Flexion: AROM;Left;10 reps;Seated Composite Extension: AROM;Left;10 reps;Seated Digit Lifts: AROM;Left;10 reps;Seated(Composite lifts and individual) Thumb Abduction: AROM;Left;10 reps;Seated Thumb Adduction: AROM;Left;10 reps;Seated Opposition: AROM;Left;10 reps;Seated Hand Activities Stack Objects: Left;10 reps;Seated Other Exercises Other Exercises: standing hamstring curls, hip extension, hip abduction, mini squats, and heel raises x 5 at sink in room with handout to review HEP    General Comments General comments (skin integrity, edema, etc.): Provided handout on FM excercisexs and sensory loss      Pertinent Vitals/Pain Pain Assessment: No/denies pain Pain Score: 2  Faces Pain Scale: Hurts a little bit  Pain Location: L LE Pain Descriptors / Indicators: Tingling Pain Intervention(s): Limited activity within patient's tolerance;Other (comment)(advised rn to pt complaint of hand hurting)    Home Living     Available Help at Discharge: Family Type of Home: House              Prior Function            PT Goals (current goals can  now be found in the care plan section) Acute Rehab PT Goals Patient Stated Goal: To get leg stronger Progress towards PT goals: Progressing toward goals    Frequency    Min 4X/week      PT Plan Current plan remains appropriate    Co-evaluation              AM-PAC PT "6 Clicks" Daily Activity  Outcome Measure  Difficulty turning over in bed (including adjusting bedclothes, sheets and blankets)?: None Difficulty moving from lying on back to sitting on the side of the bed? : None Difficulty sitting down on and standing up from a chair with arms (e.g., wheelchair, bedside commode, etc,.)?: None Help needed moving to and from a bed to chair (including a wheelchair)?: A Little Help needed walking in hospital room?: A Little Help needed climbing 3-5 steps with a railing? : A Little 6 Click Score: 21    End of Session   Activity Tolerance: Patient tolerated treatment well Patient left: in bed;with call bell/phone within reach;with family/visitor present(NP in room)   PT Visit Diagnosis: Hemiplegia and hemiparesis;Other abnormalities of gait and mobility (R26.89) Hemiplegia - Right/Left: Left Hemiplegia - dominant/non-dominant: Non-dominant Hemiplegia - caused by: Cerebral infarction     Time: 7948-0165 PT Time Calculation (min) (ACUTE ONLY): 19 min  Charges:  $Gait Training: 8-22 mins                    G CodesMagda Kiel, [place] [phone] 07/21/2017    Reginia Naas 07/21/2017, 10:46 AM

## 2017-07-21 NOTE — Discharge Summary (Addendum)
Physician Discharge Summary  Karen Ochoa MRN: [id number] DOB/AGE: 1966/02/02 52 y.o.  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, suspect HTN CM   Chronic renal insufficiency, stage III (moderate) (HCC)   Carotid artery disease ([place])    Follow-up recommendations Follow-up with PCP in 3-5 days , including all  additional recommended appointments as below Follow-up CBC, CMP in 3-5 days Patient to follow with cardiology as scheduled below for her new diagnosis of hypertensive cardiomyopathy within 7 -14 days Follow-up with neurology in  4-6 weeks     Allergies as of 07/21/2017      Reactions   Penicillins Anaphylaxis   Has patient had a PCN reaction causing immediate rash, facial/tongue/throat swelling, SOB or lightheadedness with hypotension: Yes Has patient had a PCN reaction causing severe rash involving mucus membranes or skin necrosis: Yes Has patient had a PCN reaction that required hospitalization Yes Has patient had a PCN reaction occurring within the last 10 years: No If all of the above answers are "NO", then may proceed with Cephalosporin use.   Sulfonamide Derivatives Anaphylaxis   Clonidine Derivatives Other (See Comments)   Patient passed out shortly after ingesting this at a doctor's office on 05/08/16 (was taken in conjunction with a tablet of Labetalol.)   Flagyl [metronidazole] Swelling, Other (See Comments)   Skin blisters   Labetalol Other (See Comments)   Patient passed out shortly after ingesting this at a doctor's office on 05/08/16 (was taken in conjunction with a tablet of Clonidine.)      Medication List    STOP taking these  medications   diphenhydramine-acetaminophen 25-500 MG Tabs tablet Commonly known as:  TYLENOL PM   hydrochlorothiazide 12.5 MG tablet Commonly known as:  HYDRODIURIL   metoprolol tartrate 25 MG tablet Commonly known as:  LOPRESSOR   verapamil 40 MG tablet Commonly known as:  CALAN     TAKE these medications   aspirin 325 MG tablet Take 1 tablet (325 mg total) by mouth daily. Start taking on:  07/22/2017   atorvastatin 40 MG tablet Commonly known as:  LIPITOR Take 1 tablet (40 mg total) by mouth daily at 6 PM.   carvedilol 12.5 MG tablet Commonly known as:  COREG Take 1 tablet (12.5 mg total) by mouth 2 (two) times daily with a meal.   furosemide 20 MG tablet Commonly known as:  LASIX Take 1 tablet (20 mg total) by mouth daily.   losartan 25 MG tablet Commonly known as:  COZAAR Take 1 tablet (25 mg total) by mouth daily.            Durable Medical Equipment  (From admission, onward)        Start     Ordered   07/20/17 1629  For home use only DME Cane  Once     07/20/17 1628        Discharge Condition: Stable   Discharge Instructions Get Medicines reviewed and adjusted: Please take all your medications with you for your next visit with your Primary MD  Please request your Primary MD to go over all hospital tests and procedure/radiological results at the follow up,  please ask your Primary MD to get all Hospital records sent to his/her office.  If you experience worsening of your admission symptoms, develop shortness of breath, life threatening emergency, suicidal or homicidal thoughts you must seek medical attention immediately by calling 911 or calling your MD immediately if symptoms less severe.  You must read complete instructions/literature along with all the possible adverse reactions/side effects for all the Medicines you take and that have been prescribed to you. Take any new Medicines after you have completely understood and accpet all the possible  adverse reactions/side effects.   Do not drive when taking Pain medications.   Do not take more than prescribed Pain, Sleep and Anxiety Medications  Special Instructions: If you have smoked or chewed Tobacco in the last 2 yrs please stop smoking, stop any regular Alcohol and or any Recreational drug use.  Wear Seat belts while driving.  Please note  You were cared for by a hospitalist during your hospital stay. Once you are discharged, your primary care physician will handle any further medical issues. Please note that NO REFILLS for any discharge medications will be authorized once you are discharged, as it is imperative that you return to your primary care physician (or establish a relationship with a primary care physician if you do not have one) for your aftercare needs so that they can reassess your need for medications and monitor your lab values.  Discharge Instructions    Ambulatory referral to Cardiology   Complete by:  As directed    New diagnosis of cardiomyopathy   Ambulatory referral to Neurology   Complete by:  As directed    Follow up with stroke clinic (Dr Erlinda Hong preferred, if not available, then consider [facility] Chestnut, [facility] or Jaynee Eagles whoever is available) at [facility] in about 6-8 weeks. Thanks.       Allergies  Allergen Reactions  . Penicillins Anaphylaxis    Has patient had a PCN reaction causing immediate rash, facial/tongue/throat swelling, SOB or lightheadedness with hypotension: Yes Has patient had a PCN reaction causing severe rash involving mucus membranes or skin necrosis: Yes Has patient had a PCN reaction that required hospitalization Yes Has patient had a PCN reaction occurring within the last 10 years: No If all of the above answers are "NO", then may proceed with Cephalosporin use.   . Sulfonamide Derivatives Anaphylaxis  . Clonidine Derivatives Other (See Comments)    Patient passed out shortly after ingesting this at a doctor's office on 05/08/16 (was taken  in conjunction with a tablet of Labetalol.)  . Flagyl [Metronidazole] Swelling and Other (See Comments)    Skin blisters  . Labetalol Other (See Comments)    Patient passed out shortly after ingesting this at a doctor's office on 05/08/16 (was taken in conjunction with a tablet of Clonidine.)      Disposition: 01-Home or Self Care   Consults:  Cardiology  neurology    Significant Diagnostic Studies:  Dg Chest 2 View  Result Date: 07/19/2017 CLINICAL DATA:  Acute onset of left-sided weakness.  Cough. EXAM: CHEST  2 VIEW COMPARISON:  Chest radiograph performed 08/29/2015 FINDINGS: The lungs are well-aerated and clear. There is no evidence of focal opacification, pleural effusion or pneumothorax. The heart is borderline enlarged. No acute osseous abnormalities are seen. IMPRESSION: Borderline cardiomegaly.  Lungs remain grossly clear. Electronically Signed   By: Garald Balding M.D.   On: 07/19/2017 01:30   Ct Head Wo Contrast  Result Date: 07/18/2017 CLINICAL DATA:  High  blood pressure. Focal neuro deficits, suspect stroke EXAM: CT HEAD WITHOUT CONTRAST TECHNIQUE: Contiguous axial images were obtained from the base of the skull through the vertex without intravenous contrast. COMPARISON:  May 10, 2016 FINDINGS: Brain: No evidence of acute infarction, hemorrhage, hydrocephalus, extra-axial collection or mass lesion/mass effect. There is chronic diffuse atrophy. Chronic bilateral periventricular white matter small vessel ischemic changes identified. 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 Vascular: No hyperdense vessel or unexpected calcification. Skull: Normal. Negative for fracture or focal lesion. Sinuses/Orbits: No acute finding. Other: None. IMPRESSION: No focal acute intracranial abnormality identified. Chronic diffuse atrophy. Chronic bilateral periventricular white matter small vessel ischemic change. Electronically Signed   By: Abelardo Diesel M.D.   On: 07/18/2017  18:01   Mr Jodene Nam Head Wo Contrast  Result Date: 07/19/2017 CLINICAL DATA:  Left-sided weakness. Acute right pontine infarct and advanced white matter disease on MRI. EXAM: MRA HEAD WITHOUT CONTRAST TECHNIQUE: Angiographic images of the Circle of Willis were obtained using MRA technique without intravenous contrast. COMPARISON:  None. FINDINGS: There is mild-to-moderate motion artifact despite repeated imaging. The visualized distal vertebral arteries are widely patent to the basilar with the left being slightly dominant. Patent PICA and SCA origins are identified bilaterally. The basilar artery is widely patent. There is a small right posterior communicating artery. The PCAs are patent with evidence of a severe proximal P2 stenosis on the left. The internal carotid arteries are patent from skullbase to carotid termini with suspected mild right cavernous stenosis, however motion artifact limits assessment. ACAs and MCAs are patent without evidence of significant M1 or right A1 stenosis. The left A1 segment is hypoplastic. No proximal branch occlusion is identified. Assessment for branch vessel stenosis is limited by motion. No aneurysm is identified. IMPRESSION: 1. Motion degraded examination. 2. No large vessel occlusion or evidence of flow limiting proximal anterior circulation stenosis. 3. Severe proximal left P2 stenosis. Electronically Signed   By: Logan Bores M.D.   On: 07/19/2017 07:04   Mr Brain W And Wo Contrast  Result Date: 07/18/2017 CLINICAL DATA:  52 y/o  F; left-sided numbness. EXAM: MRI HEAD WITHOUT AND WITH CONTRAST TECHNIQUE: Multiplanar, multiecho pulse sequences of the brain and surrounding structures were obtained without and with intravenous contrast. CONTRAST:  56m MULTIHANCE GADOBENATE DIMEGLUMINE 529 MG/ML IV SOLN COMPARISON:  07/18/2017 CT head.  04/23/2006 MRI head. FINDINGS: Brain: Right posterior paramedian pontine subcentimeter focus of reduced diffusion (series 4, image 13).  Numerous foci of T2 FLAIR hyperintense signal abnormality are present throughout subcortical and periventricular white matter. There are lesions within the genu of corpus callosum, patchy foci throughout the pons, bilateral thalamus, brachium pontis, and cerebellar white matter. Cavum septum pellucidum. Mild brain parenchymal volume loss. No abnormal enhancement of the brain. No susceptibility hypointensity to suggest intracranial hemorrhage. Vascular: Normal flow voids. Skull and upper cervical spine: Normal marrow signal. Sinuses/Orbits: Sphenoid sinus mucosal thickening. Trace right mastoid effusion. Otherwise negative. Other: None. IMPRESSION: Right posterior paramedian pontine focus of reduced diffusion and progressed age advanced white matter lesions. Findings probably represent an acute/early subacute pontine infarction and chronic microvascular ischemic changes of white matter, particularly in the setting of hypertension or diabetes. Given lesions in corpus callosum and cerebellum, a demyelinating process such as MS should also be considered. Electronically Signed   By: LKristine GarbeM.D.   On: 07/18/2017 22:13   [place]al  Result Date: 07/19/2017 CLINICAL DATA:  Acute onset of renal insufficiency. EXAM: RENAL / URINARY TRACT  ULTRASOUND COMPLETE COMPARISON:  None. FINDINGS: Right Kidney: Length: 10.0 cm. Echogenicity within normal limits. No mass or hydronephrosis visualized. Left Kidney: Length: 9.6 cm. Echogenicity within normal limits. A few tiny left renal cysts are noted, the largest of which measures 0.8 cm at the upper pole of the left kidney. No hydronephrosis visualized. Evaluation of the left kidney is somewhat suboptimal due to overlying bowel gas. Bladder: Appears normal for degree of bladder distention. IMPRESSION: 1. No evidence of hydronephrosis. 2. Tiny left renal cysts noted. Electronically Signed   By: Garald Balding M.D.   On: 07/19/2017 03:44    echocardiogram  LV EF:  30% -   35%  ------------------------------------------------------------------- Indications:      CVA 436.  ------------------------------------------------------------------- History:   PMH:   Transient ischemic attack.  Risk factors: Hypertension.  ------------------------------------------------------------------- Study Conclusions  - Left ventricle: The cavity size was normal. There was severe   concentric hypertrophy. Systolic function was moderately to   severely reduced. The estimated ejection fraction was in the   range of 30% to 35%. Diffuse hypokinesis. Doppler parameters are   consistent with abnormal left ventricular relaxation (grade 1   diastolic dysfunction). Doppler parameters are consistent with   elevated ventricular end-diastolic filling pressure. - Aortic valve: There was trivial regurgitation. - Aortic root: The aortic root was normal in size. - Mitral valve: There was mild regurgitation directed posteriorly. - Left atrium: The atrium was mildly dilated. - Right ventricle: The cavity size was normal. Wall thickness was   normal. Systolic function was normal. - Right atrium: The atrium was normal in size. - Tricuspid valve: There was mild regurgitation. - Pulmonic valve: There was trivial regurgitation. - Pulmonary arteries: Systolic pressure was within the normal   range. - Inferior vena cava: The vessel was normal in size. - Pericardium, extracardiac: There was no pericardial effusion.  Impressions:  - Severe concentric LVH with diffuse hypokinesis and LVEF 30-35%. A   cardiac MRI is recommended to differentiate hypertensive vs   infiltrative cardiomyopathy.       Filed Weights   07/18/17 1418 07/19/17 2000 07/21/17 0525  Weight: 74.8 kg (165 lb) 78.8 kg (173 lb 11.6 oz) 79.8 kg (175 lb 14.8 oz)     Microbiology: Recent Results (from the past 240 hour(s))  MRSA PCR Screening     Status: None   Collection Time: 07/20/17  4:50 PM   Result Value Ref Range Status   MRSA by PCR NEGATIVE NEGATIVE Final    Comment:        The GeneXpert MRSA Assay (FDA approved for NASAL specimens only), is one component of a comprehensive MRSA colonization surveillance program. It is not intended to diagnose MRSA infection nor to guide or monitor treatment for MRSA infections.        Blood Culture    Component Value Date/Time   SDES URINE, CLEAN CATCH 01/13/2010 2055   [place] NONE 01/13/2010 2055   CULT ESCHERICHIA COLI 01/13/2010 2055   REPTSTATUS 01/15/2010 FINAL 01/13/2010 2055      Labs: Results for orders placed or performed during the hospital encounter of 07/18/17 (from the past 48 hour(s))  TSH     Status: None   Collection Time: 07/20/17  4:46 PM  Result Value Ref Range   TSH 0.856 0.350 - 4.500 uIU/mL    Comment: Performed by a 3rd Generation assay with a functional sensitivity of <=0.01 uIU/mL.  Brain natriuretic peptide     Status: Abnormal   Collection Time:  07/20/17  4:46 PM  Result Value Ref Range   B Natriuretic Peptide 306.8 (H) 0.0 - 100.0 pg/mL  MRSA PCR Screening     Status: None   Collection Time: 07/20/17  4:50 PM  Result Value Ref Range   MRSA by PCR NEGATIVE NEGATIVE    Comment:        The GeneXpert MRSA Assay (FDA approved for NASAL specimens only), is one component of a comprehensive MRSA colonization surveillance program. It is not intended to diagnose MRSA infection nor to guide or monitor treatment for MRSA infections.   Comprehensive metabolic panel     Status: Abnormal   Collection Time: 07/21/17  7:55 AM  Result Value Ref Range   Sodium 139 135 - 145 mmol/L   Potassium 3.4 (L) 3.5 - 5.1 mmol/L   Chloride 106 101 - 111 mmol/L   CO2 23 22 - 32 mmol/L   Glucose, Bld 123 (H) 65 - 99 mg/dL   BUN 28 (H) 6 - 20 mg/dL   Creatinine, Ser 1.37 (H) 0.44 - 1.00 mg/dL   Calcium 8.7 (L) 8.9 - 10.3 mg/dL   Total Protein 6.0 (L) 6.5 - 8.1 g/dL   Albumin 3.2 (L) 3.5 - 5.0 g/dL    AST 20 15 - 41 U/L   ALT 15 14 - 54 U/L   Alkaline Phosphatase 96 38 - 126 U/L   Total Bilirubin 0.5 0.3 - 1.2 mg/dL   GFR calc non Af Amer 43 (L) >60 mL/min   GFR calc Af Amer 50 (L) >60 mL/min    Comment: (NOTE) The eGFR has been calculated using the CKD EPI equation. This calculation has not been validated in all clinical situations. eGFR's persistently <60 mL/min signify possible Chronic Kidney Disease.    Anion gap 10 5 - 15     Lipid Panel     Component Value Date/Time   CHOL 187 07/19/2017 0556   TRIG 210 (H) 07/19/2017 0556   HDL 40 (L) 07/19/2017 0556   CHOLHDL 4.7 07/19/2017 0556   VLDL 42 (H) 07/19/2017 0556   LDLCALC 105 (H) 07/19/2017 0556     Lab Results  Component Value Date   HGBA1C 5.9 (H) 07/19/2017     Lab Results  Component Value Date   LDLCALC 105 (H) 07/19/2017   CREATININE 1.37 (H) 07/21/2017     HPI :  Karen Ochoa a 52 y.o.femalewith a history of hypertension since age 12 as well as long history of smoking who presents with left-sided weakness has been present for the past few days. She states that she noticed it on Wednesday, it has been persistent ever since. She noticed that her blood pressure was quite high today and therefore sought care in the emergency department. She denies any history of progressive numbness/weakness or vision loss in the past. She has had "mini strokes"in the past    HOSPITAL COURSE:   Acute CVA 1. HgbA1c 5.9, fasting lipid panel -LDL 105, triglycerides 210 2. MRA of the brain without contrast -) time infarct 3. Frequent neuro checks stable 4. Echocardiogram  Severe concentric LVH with diffuse hypokinesis and LVEF 30-35%, no prior hx of comp , cardiology has been requested to see , hypertensive vs infiltrative comp 5. Carotid dopplers 1-39% bilaterally 6. Prophylactic therapy-Antiplatelet med: Aspirin - dose 366m PO or 3016mPR, continue  Lipitor 40 mg a day. 7. Risk factor modification 8.  Telemetry monitoring shows normal sinus rhythm 9. PT consult, OT consult, Speech consult -  pending   Hypertension-blood pressure has improved  Currently in the 130s, patient is currently on HCTZ 12.5 mg by mouth twice a day and metoprolol 25 mg by mouth twice a day. She is also on verapamil 40 mg by mouth twice a day. Will add hydralazine for systolic greater than 347  Hypokalemia-repleted  Renal insufficiency Presenting creatinine was 1.6, now 1.45. Baseline appears to be around 1.3 Renal ultrasound did not show any evidence of hydronephrosis or stones  New onset CHF Found to have an EF of 30-35% with severe concentric LVH concerning for hypertensive versus infiltrative cardiomyopathy Cardiology consulted Patient's Coreg increased to 12.5 mg twice a day Also started on lisinopril, also added low-dose Lasix  Calan and HCTZ discontinued  Discharge Exam: *  Blood pressure 131/88, pulse 77, temperature 98.1 F (36.7 C), temperature source Oral, resp. rate 18, height _0  (1.702 m), weight 79.8 kg (175 lb 14.8 oz), last menstrual period 05/19/2013, SpO2 100 %.  GEN:No acute distress.   Neck:No JVD, no bruit Cardiac:RRR, no murmurs, rubs, or gallops.  Respiratory:Clear to auscultation bilaterally. QQ:VZDG, nontender, non-distended  MS:No edema; No deformity. Neuro:Nonfocal  Psych: Normal affect       Follow-up [place] Follow up.   Specialty:  Internal Medicine Why:  You can get a doctor here without insurance Contact information: [place] Follow up.   Why:  You can get a doctor here without insurance.  Once you have an appointment at one of [facility]'s clinics, you can use this location for your PHARMACY needs and for FINANCIAL COUNSELING. Contact information: [place]  [address] [phone]       Rosalin Hawking, MD. Schedule an appointment as soon as possible for a visit in 6 week(s).   Specialty:  Neurology Contact information: [address] [place] [address] [phone]        Nahser, Wonda Cheng, MD Follow up.   Specialty:  Cardiology Why:  office will contact you with an appointment Contact information: [address] [place] [address] [phone]           Signed: Reyne Dumas 07/21/2017, 10:26 AM        Time spent >1 hour

## 2017-07-21 NOTE — Discharge Instructions (Signed)
Heart Failure °Heart failure means your heart has trouble pumping blood. This makes it hard for your body to work well. Heart failure is usually a long-term (chronic) condition. You must take good care of yourself and follow your doctor's treatment plan. °Follow these instructions at home: °· Take your heart medicine as told by your doctor. °? Do not stop taking medicine unless your doctor tells you to. °? Do not skip any dose of medicine. °? Refill your medicines before they run out. °? Take other medicines only as told by your doctor or pharmacist. °· Stay active if told by your doctor. The elderly and people with severe heart failure should talk with a doctor about physical activity. °· Eat heart-healthy foods. Choose foods that are without trans fat and are low in saturated fat, cholesterol, and salt (sodium). This includes fresh or frozen fruits and vegetables, fish, lean meats, fat-free or low-fat dairy foods, whole grains, and high-fiber foods. Lentils and dried peas and beans (legumes) are also good choices. °· Limit salt if told by your doctor. °· Cook in a healthy way. Roast, grill, broil, bake, poach, steam, or stir-fry foods. °· Limit fluids as told by your doctor. °· Weigh yourself every morning. Do this after you pee (urinate) and before you eat breakfast. Write down your weight to give to your doctor. °· Take your blood pressure and write it down if your doctor tells you to. °· Ask your doctor how to check your pulse. Check your pulse as told. °· Lose weight if told by your doctor. °· Stop smoking or chewing tobacco. Do not use gum or patches that help you quit without your doctor's approval. °· Schedule and go to doctor visits as told. °· Nonpregnant women should have no more than 1 drink a day. Men should have no more than 2 drinks a day. Talk to your doctor about drinking alcohol. °· Stop illegal drug use. °· Stay current with shots (immunizations). °· Manage your health conditions as told by your  doctor. °· Learn to manage your stress. °· Rest when you are tired. °· If it is really hot outside: °? Avoid intense activities. °? Use air conditioning or fans, or get in a cooler place. °? Avoid caffeine and alcohol. °? Wear loose-fitting, lightweight, and light-colored clothing. °· If it is really cold outside: °? Avoid intense activities. °? Layer your clothing. °? Wear mittens or gloves, a hat, and a scarf when going outside. °? Avoid alcohol. °· Learn about heart failure and get support as needed. °· Get help to maintain or improve your quality of life and your ability to care for yourself as needed. °Contact a doctor if: °· You gain weight quickly. °· You are more short of breath than usual. °· You cannot do your normal activities. °· You tire easily. °· You cough more than normal, especially with activity. °· You have any or more puffiness (swelling) in areas such as your hands, feet, ankles, or belly (abdomen). °· You cannot sleep because it is hard to breathe. °· You feel like your heart is beating fast (palpitations). °· You get dizzy or light-headed when you stand up. °Get help right away if: °· You have trouble breathing. °· There is a change in mental status, such as becoming less alert or not being able to focus. °· You have chest pain or discomfort. °· You faint. °This information is not intended to replace advice given to you by your health care provider. Make sure you   discuss any questions you have with your health care provider. °Document Released: 04/14/2008 Document Revised: 12/12/2015 Document Reviewed: 08/22/2012 °Elsevier Interactive Patient Education © 2017 [employer] ° °

## 2017-07-21 NOTE — Progress Notes (Signed)
Progress Note  Patient Name: Karen Ochoa Date of Encounter: 07/21/2017  Primary Cardiologist: Will be Dr Acie Fredrickson (he has not seen her yet)  Subjective   Lt sided weakness improved. No chest pain or SOB.  Inpatient Medications    Scheduled Meds: . aspirin  300 mg Rectal Daily   Or  . aspirin  325 mg Oral Daily  . atorvastatin  40 mg Oral q1800  . carvedilol  12.5 mg Oral BID WC  . enoxaparin (LOVENOX) injection  40 mg Subcutaneous QHS  . hydrochlorothiazide  12.5 mg Oral BID  . Influenza vac split quadrivalent PF  0.5 mL Intramuscular Tomorrow-1000  . lisinopril  5 mg Oral Daily  . pneumococcal 23 valent vaccine  0.5 mL Intramuscular Tomorrow-1000   Continuous Infusions:  PRN Meds: acetaminophen **OR** acetaminophen (TYLENOL) oral liquid 160 mg/5 mL **OR** acetaminophen, hydrALAZINE   Vital Signs    Vitals:   07/20/17 1731 07/20/17 2046 07/21/17 0116 07/21/17 0525  BP: (!) 161/103 (!) 146/94 (!) 136/94 134/84  Pulse: 83 77 73 69  Resp: 18 18 18 18   Temp: 98.2 F (36.8 C) 98.1 F (36.7 C) 98.1 F (36.7 C) 98.3 F (36.8 C)  TempSrc: Oral Oral Oral Oral  SpO2: 100% 100% 99% 100%  Weight:    175 lb 14.8 oz (79.8 kg)  Height:        Intake/Output Summary (Last 24 hours) at 07/21/2017 0927 Last data filed at 07/21/2017 0900 Gross per 24 hour  Intake 720 ml  Output -  Net 720 ml   Filed Weights   07/18/17 1418 07/19/17 2000 07/21/17 0525  Weight: 165 lb (74.8 kg) 173 lb 11.6 oz (78.8 kg) 175 lb 14.8 oz (79.8 kg)    Telemetry    NSR, one couplet - Personally Reviewed  ECG    NSR, LVH - Personally Reviewed  Physical Exam   GEN: No acute distress.   Neck: No JVD, no bruit Cardiac: RRR, soft systolic  Murmur, no  rubs, or gallops.  Respiratory: Clear to auscultation bilaterally. GI: Soft, nontender, non-distended  MS: No edema; No deformity. Neuro:  Nonfocal  Psych: Normal affect   Labs    Chemistry Recent Labs  Lab 07/18/17 1850 07/19/17 0556    NA 141 140  K 3.6 3.2*  CL 106 107  CO2 25 24  GLUCOSE 93 90  BUN 33* 33*  CREATININE 1.60* 1.45*  CALCIUM 9.2 9.0  PROT 6.9  --   ALBUMIN 3.8  --   AST 34  --   ALT 13*  --   ALKPHOS 111  --   BILITOT 0.9  --   GFRNONAA 36* 40*  GFRAA 41* 47*  ANIONGAP 10 9     Hematology Recent Labs  Lab 07/18/17 1850  WBC 7.2  RBC 4.34  HGB 12.8  HCT 38.2  MCV 88.0  MCH 29.5  MCHC 33.5  RDW 15.6*  PLT 194    Cardiac EnzymesNo results for input(s): TROPONINI in the last 168 hours. No results for input(s): TROPIPOC in the last 168 hours.   BNP Recent Labs  Lab 07/20/17 1646  BNP 306.8*     DDimer No results for input(s): DDIMER in the last 168 hours.   Radiology    CXR 2V 07/19/17-  CHEST  2 VIEW  COMPARISON:  Chest radiograph performed 08/29/2015  FINDINGS: The lungs are well-aerated and clear. There is no evidence of focal opacification, pleural effusion or pneumothorax.  The heart  is borderline enlarged. No acute osseous abnormalities are seen.  IMPRESSION: Borderline cardiomegaly.  Lungs remain grossly clear.   Cardiac Studies   Echo 07/20/17- Impressions:  - Severe concentric LVH with diffuse hypokinesis and LVEF 30-35%. A   cardiac MRI is recommended to differentiate hypertensive vs   infiltrative cardiomyopathy.  Patient Profile     52 y.o. female with a medical history significant of HTN, prior TIAs, moderate carotid disease, and CRI-3.  Patient presented to the ED 07/18/17 with c/o high blood pressure and onset of LUE and LLE numbness and weakness. 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 BP noted to be very high on admission.  She has not been able to get her BP meds as an OP. Echo shows LVD 30-35%.   Assessment & Plan    Cardiomyopathy- Suspected to be secondary to HTN.  Accelerated HTN Medications adjusted, improved  CRI-3 stable  Moderate carotid disease  By doppler 2010, MRA 07/19/17 showed no large vessel occlusion or evidence of  flow limiting proximal anterior circulation stenosis.She did have severe proximal left P2 stenosis.  Smoker Discussed the importance of quitting  Medication non compliance Secondary to financial issues. Care manager assisting. Will keep medications generic where possible.   Plan: Dr Acie Fredrickson to review but I suspect pt is OK for discharge today. We will arrange for TOC f/u in 7-14 days. Further work up for cardiomyopathy as an OP once she has Medicaid.     For questions or updates, please contact [place] Please consult [email or web address] for contact info under Cardiology/STEMI.      Signed, Kerin Ransom, PA-C  07/21/2017, 9:27 AM    Attending Note:   The patient was seen and examined.  Agree with assessment and plan as noted above.  Changes made to the above note as needed.  Patient seen and independently examined with Kerin Ransom, PA .   We discussed all aspects of the encounter. I agree with the assessment and plan as stated above.  1.  Chronic systolic congestive heart failure: The patient does not have much in way of symptoms.  She has been noncompliant with her medications.  She is now on carvedilol and lisinopril.  Both of these are generic and she should be able to for them.  We will see her in the office in several weeks and then I will see her again in several months.  Anticipate doing an ischemic workup-possibly a right and left heart catheterization at some point.  I like to wait until she heals up more from her stroke.  2.  Essential hypertension: We will continue up titration of her medications.  3.  Cigarette smoking: I have advised her to stop smoking.   I have spent a total of 40 minutes with patient reviewing hospital  notes , telemetry, EKGs, labs and examining patient as well as establishing an assessment and plan that was discussed with the patient. > 50% of time was spent in direct patient care.    Thayer Headings, Brooke Bonito., MD, [facility] 07/21/2017, 1:44 PM [address]  [place] Pager [phone]

## 2017-07-21 NOTE — Progress Notes (Signed)
Pt home DME walker and 3 in 1 delivered to pt at bedside; pt transported off unit via wheelchair with belongings and family to the side. Delia Heady RN

## 2017-07-21 NOTE — Progress Notes (Addendum)
Occupational Therapy Treatment Patient Details Name: Karen Ochoa MRN: [id number] DOB: [birth year] Today's Date: 07/21/2017    History of present illness Karen Ochoa is a 52 y.o. female with a history of hypertension since age 33 as well as long history of smoking who presents with left-sided weakness has been present for the past few days.  MRI positive: Right posterior paramedian pontine focus of reduced diffusion and progressed age advanced white matter lesions. Findings probably represent an acute/early subacute pontine infarction and chronic microvascular ischemic changes of white matter, particularly in the setting of hypertension or diabetes. Given lesions in corpus callosum and cerebellum, a demyelinating process such as MS should also be considered.    OT comments  Pt progressing towards established OT goals. Provided handout and education on FM exercises and sensory loss. Pt verbalizing understanding of safety for sensory loss. Pt performing FM exercises for 10 reps and Min VCs. Pt performing tub transfer with Min Guard for safety; discussed use of 3n1 as tub seat. Continue to recommend dc home once medically stable per physician. Will continue to follow acutely.   Follow Up Recommendations  No OT follow up;Supervision/Assistance - 24 hour    Equipment Recommendations  3 in 1 bedside commode(As shower seat)    Recommendations for Other Services PT consult    Precautions / Restrictions Precautions Precautions: Fall Restrictions Weight Bearing Restrictions: No       Mobility Bed Mobility Overal bed mobility: Modified Independent                Transfers Overall transfer level: Modified independent                    Balance Overall balance assessment: Needs assistance   Sitting balance-Leahy Scale: Good       Standing balance-Leahy Scale: Good Standing balance comment: good static balance, some decreased dynamic balance                            ADL either performed or assessed with clinical judgement   ADL Overall ADL's : Needs assistance/impaired                                 Tub/ Shower Transfer: Min guard;Ambulation;Tub transfer;Shower Scientist, research (medical) Details (indicate cue type and reason): Educated pt on safe transfer technique with poor balance. Recommended 3N1 for pt and reviewed use of tub seat Functional mobility during ADLs: Min guard;Minimal assistance(Flucuating between Min Guard-Min A for balance) General ADL Comments: Reviewed LUE excericises and sensation handout. Pt verbalizing understanding of sensaory loss information and dmeonstrated understanding of hand excericses. Pt performing tub transfer with Min Guard A and demonstrated understanding of safe tehcnique     Vision   Additional Comments: Will continue to monitor   Perception     Praxis      Cognition Arousal/Alertness: Awake/alert Behavior During Therapy: WFL for tasks assessed/performed Overall Cognitive Status: Within Functional Limits for tasks assessed                                 General Comments: Continues to demosntrate decreased problem solving. Needing Min VCs for reviewing and performing excercises        Exercises Exercises: Hand exercises;Hand activities Hand Exercises Digit Composite Flexion: AROM;Left;10 reps;Seated Composite Extension: AROM;Left;10 reps;Seated Digit Lifts:  AROM;Left;10 reps;Seated(Composite lifts and individual) Thumb Abduction: AROM;Left;10 reps;Seated Thumb Adduction: AROM;Left;10 reps;Seated Opposition: AROM;Left;10 reps;Seated Hand Activities Stack Objects: Left;10 reps;Seated   Shoulder Instructions       General Comments Provided handout on FM excercisexs and sensory loss    Pertinent Vitals/ Pain       Pain Assessment: Faces Faces Pain Scale: Hurts a little bit Pain Location: L LE Pain Descriptors / Indicators: Tingling;Discomfort Pain  Intervention(s): Monitored during session  Home Living                                          Prior Functioning/Environment              Frequency  Min 2X/week        Progress Toward Goals  OT Goals(current goals can now be found in the care plan section)  Progress towards OT goals: Progressing toward goals  Acute Rehab OT Goals Patient Stated Goal: To get leg stronger OT Goal Formulation: With patient Time For Goal Achievement: 08/03/17 Potential to Achieve Goals: Good ADL Goals Pt Will Perform Grooming: Independently;standing Pt Will Perform Tub/Shower Transfer: Tub transfer;ambulating;Independently Pt/caregiver will Perform Home Exercise Program: Increased ROM;Increased strength;Left upper extremity;With theraputty;With written HEP provided;Independently Additional ADL Goal #1: Pt will perform three step trail making task with 1-2 VCs  Plan Discharge plan remains appropriate;Frequency remains appropriate;Equipment recommendations need to be updated    Co-evaluation                 AM-PAC PT "6 Clicks" Daily Activity     Outcome Measure   Help from another person eating meals?: None Help from another person taking care of personal grooming?: None Help from another person toileting, which includes using toliet, bedpan, or urinal?: A Little Help from another person bathing (including washing, rinsing, drying)?: A Little Help from another person to put on and taking off regular upper body clothing?: A Little Help from another person to put on and taking off regular lower body clothing?: A Little 6 Click Score: 20    End of Session Equipment Utilized During Treatment: Gait belt  OT Visit Diagnosis: Unsteadiness on feet (R26.81);Other abnormalities of gait and mobility (R26.89);Muscle weakness (generalized) (M62.81);Other symptoms and signs involving cognitive function;Hemiplegia and hemiparesis Hemiplegia - Right/Left: Left Hemiplegia -  dominant/non-dominant: Non-Dominant Hemiplegia - caused by: Cerebral infarction   Activity Tolerance Patient tolerated treatment well   Patient Left in bed;with call bell/phone within reach;with bed alarm set   Nurse Communication Mobility status        Time: 3354-5625 OT Time Calculation (min): 26 min  Charges: OT General Charges $OT Visit: 1 Visit OT Treatments $Self Care/Home Management : 8-22 mins $Therapeutic Activity: 8-22 mins  [place], OTR/L Acute Rehab Pager: [phone] Office: [place] 07/21/2017, 9:28 AM

## 2017-07-22 ENCOUNTER — Telehealth: Payer: Self-pay | Admitting: Cardiovascular Disease

## 2017-07-22 NOTE — Telephone Encounter (Signed)
New message  [facility] appointment mad on 08/05/17 at 9:30am with Karen Ochoa per [facility]

## 2017-07-23 ENCOUNTER — Telehealth: Payer: Self-pay | Admitting: Neurology

## 2017-07-23 NOTE — Telephone Encounter (Signed)
Pt called and was speak with another phone staff and was asked for my help. I took the call and the pt said Karen Ochoa has advised her she has RX for lisinopril to pick but pt said she is allergic to it and when she was in the hospital she advised the staff of this. Pt said the verapamil was changed to lisinopril without her consent. I asked the pt if she could tell me the prescribing provider on the bottle of the lisinopril, she said her cousin was on the way home with the RX. I said you can't take that if you are allergic to it, she said I can't be without BP med and that is what I was given everyday in the hospital. I asked the pt if she was sure she had the correct name for the medication she was talking about, she said yes. The patient told the other phone staff it waslosartan (COZAAR) 25 MG tablet  Pt is very unhappy. Pt is demanding to speak with Dr Erlinda Hong. Please call to advise

## 2017-07-26 NOTE — Telephone Encounter (Signed)
I did not see lisinopril on her medication list. I called her and confirmed that she is on ASA and lipitor, coreg, lasix and losartan. But she said she also takes lisinopril as prescribed but I am not sure who did that. It was not on her medication list. She said her BP so far good. I told her to stop lisinopril. She has appointment with her new PCP on 08/04/17 and cardiology appointment on 08/05/17. She expressed understanding and appreciation.   Rosalin Hawking, MD PhD Stroke Neurology 07/26/2017 1:47 PM

## 2017-07-26 NOTE — Telephone Encounter (Signed)
**Note De-identified Karen Ochoa Obfuscation** LMTCB

## 2017-07-27 NOTE — Telephone Encounter (Signed)
LMTCB

## 2017-07-28 ENCOUNTER — Other Ambulatory Visit: Payer: Self-pay

## 2017-07-28 NOTE — Patient Outreach (Signed)
[facility]) Care Management  07/28/2017  Karen Ochoa [birth year] [id number]   EMMI- Stroke RED ON EMMI ALERT Day # 6 Date:  07-27-17 Red Alert Reason: Smoked or been around smoke? Yes  Telephone call to patient for EMMI red alert. Patient able to verify HIPAA. Addressed EMMI with patient. She states she is still smoking but trying to quit.  Advised patient on quit smoking information listed on discharge summary.  Offered to send smoking information to patient as well. She is in agreement.   Discussed [facility] Care Management services. Patient declined services at this time.   Plan: RN CM will send patient EMMI information for quitting smoking. RN CM will close case and notify care management assistant of case status.  Jone Baseman, RN, MSN [facility] Care Management Care Management Coordinator Direct Line [phone] Toll Free: [phone]  Fax: [phone]

## 2017-08-04 ENCOUNTER — Other Ambulatory Visit: Payer: Self-pay

## 2017-08-04 ENCOUNTER — Ambulatory Visit (INDEPENDENT_AMBULATORY_CARE_PROVIDER_SITE_OTHER): Payer: Self-pay | Admitting: Family Medicine

## 2017-08-04 ENCOUNTER — Encounter: Payer: Self-pay | Admitting: Family Medicine

## 2017-08-04 VITALS — BP 146/82 | HR 72 | Temp 97.8°F | Resp 16 | Ht 67.0 in | Wt 176.0 lb

## 2017-08-04 DIAGNOSIS — I6300 Cerebral infarction due to thrombosis of unspecified precerebral artery: Secondary | ICD-10-CM

## 2017-08-04 DIAGNOSIS — N183 Chronic kidney disease, stage 3 unspecified: Secondary | ICD-10-CM

## 2017-08-04 DIAGNOSIS — J449 Chronic obstructive pulmonary disease, unspecified: Secondary | ICD-10-CM

## 2017-08-04 DIAGNOSIS — F329 Major depressive disorder, single episode, unspecified: Secondary | ICD-10-CM

## 2017-08-04 DIAGNOSIS — F32A Depression, unspecified: Secondary | ICD-10-CM

## 2017-08-04 DIAGNOSIS — N281 Cyst of kidney, acquired: Secondary | ICD-10-CM

## 2017-08-04 DIAGNOSIS — I6529 Occlusion and stenosis of unspecified carotid artery: Secondary | ICD-10-CM

## 2017-08-04 DIAGNOSIS — R531 Weakness: Secondary | ICD-10-CM

## 2017-08-04 DIAGNOSIS — Z23 Encounter for immunization: Secondary | ICD-10-CM

## 2017-08-04 DIAGNOSIS — F17200 Nicotine dependence, unspecified, uncomplicated: Secondary | ICD-10-CM

## 2017-08-04 DIAGNOSIS — I10 Essential (primary) hypertension: Secondary | ICD-10-CM

## 2017-08-04 MED ORDER — BUPROPION HCL ER (SR) 150 MG PO TB12: 150.0000 mg | ORAL_TABLET | Freq: Two times a day (BID) | ORAL | 5 refills | Status: DC

## 2017-08-04 MED ORDER — LOSARTAN POTASSIUM 25 MG PO TABS: 25.0000 mg | ORAL_TABLET | Freq: Every day | ORAL | 5 refills | Status: DC

## 2017-08-04 MED ORDER — FUROSEMIDE 20 MG PO TABS
20.0000 mg | ORAL_TABLET | Freq: Every day | ORAL | 11 refills | Status: DC
Start: 2017-08-04 — End: 2017-10-16

## 2017-08-04 MED ORDER — ATORVASTATIN CALCIUM 40 MG PO TABS: 40.0000 mg | ORAL_TABLET | Freq: Every day | ORAL | 5 refills | Status: DC

## 2017-08-04 MED ORDER — ALBUTEROL SULFATE HFA 108 (90 BASE) MCG/ACT IN AERS: 2.0000 | INHALATION_SPRAY | Freq: Four times a day (QID) | RESPIRATORY_TRACT | 0 refills | Status: DC | PRN

## 2017-08-04 MED ORDER — ASPIRIN 325 MG PO TABS: 325.0000 mg | ORAL_TABLET | Freq: Every day | ORAL | 5 refills | Status: DC

## 2017-08-04 MED ORDER — CARVEDILOL 12.5 MG PO TABS: 12.5000 mg | ORAL_TABLET | Freq: Two times a day (BID) | ORAL | 5 refills | Status: DC

## 2017-08-04 NOTE — Patient Instructions (Addendum)
Thanks for establishing care.  We have reviewed all post hospital notes.  I will follow-up by phone with any abnormal laboratory values.  Discussed the importance of following up with all specialists as scheduled.  Discussed smoking cessation.  Will start a trial of Wellbutrin 150 mg twice daily.   Discussed insomnia at length.  Practice good sleep hygiene.  Stick to a sleep schedule, even on weekends. Exercise is great, but not too late in the day Avoid alcoholic drinks before bed Avoid large meals and beverages late before bed Don't take naps after 3 pm. Keep power naps less than 1 hour.  Relax before bed.  Take a hot bath before bed.  Have a good sleeping environment. Get rid of anything in your bedroom that might distract you from sleep.  Adopt good sleeping posture.    Recommend that you complete financial assistance forms for payer source.  Your [facility] have been [facility] to [facility].

## 2017-08-04 NOTE — Patient Outreach (Addendum)
[facility]) Care Management  08/04/2017  Zyanne Schumm Goods 03-20-1966 [id number]   EMMI- Stroke RED ON EMMI ALERT Day # 13 Date: 08/03/17 Red Alert Reason: Feeling worse overall- yes New problems walking, talking, speaking, seeing? Yes  Telephone call to patient.  She is able to verify HIPAA.  Addressed red EMMI with patient and she immediately states she cannot hear where she is right now and asked for a call back later.  Advised patient that CM would try her another time.    1:40 pm  Return call to patient as asked. No answer. HIPAA compliant voice message left.  Plan: RN CM will attempt patient again within 3 business days.    Jone Baseman, RN, MSN [facility] Care Management Care Management Coordinator Direct Line [phone] Toll Free: [phone]  Fax: [phone]

## 2017-08-04 NOTE — Progress Notes (Signed)
Subjective:    Patient ID: Karen Ochoa, female    DOB: December 24, 1965, 52 y.o.   MRN: [id number]  HPI Karen Ochoa, a 52 year old female presents accompanied by friend Karen Ochoa to establish care. Karen Ochoa has not had a primary provider and has mostly been using the emergency department for all primary needs.  Patient states that she has not had a primary provider due to financial and insurance constraints.  She has a history that is significant for CVA and uncontrolled hypertension.  Patient was admitted to inpatient services on 07/19/2017.  Patient presented to the emergency department with left lower extremity numbness and weakness.  Blood pressure was markedly elevated on arrival.  She had been out of antihypertensive medications for greater than a month.  Patient had a MRI which confirmed an acute-subacute ischemic pontine stroke.  Patient was discharged home on statin and ASA therapy. Patient also has a history of stage III chronic kidney disease.  She is not under the care of nephrology.  Patient does not exercise routinely or follow a low-fat, low-sodium diet.  Karen Ochoa does not check blood pressures at home.    Cardiac symptoms fatigue. Patient denies chest pain, dyspnea, palpitations, syncope and tachypnea.  Cardiovascular risk factors: dyslipidemia, sedentary lifestyle and smoking/ tobacco exposure.  Patient endorses left-sided weakness and is ambulating with a cane.  Past Medical History:  Diagnosis Date  . Asthma 2017  . Fibroids   . GERD (gastroesophageal reflux disease)   . Headache    "weekly" (07/20/2017)  . High cholesterol 07/18/2017  . Hx MRSA infection   . Hypertension   . Insomnia due to medical condition   . Stroke ([place]) 07/18/2017  . TIA (transient ischemic attack)    "I've had several"   Social History   Socioeconomic History  . Marital status: Single    Spouse name: Not on file  . Number of children: Not on file  . Years of education: Not on file  . Highest  education level: Not on file  Social Needs  . Financial resource strain: Not on file  . Food insecurity - worry: Not on file  . Food insecurity - inability: Not on file  . Transportation needs - medical: Not on file  . Transportation needs - non-medical: Not on file  Occupational History  . Not on file  Tobacco Use  . Smoking status: Current Every Day Smoker    Packs/day: 0.50    Years: 10.00    Pack years: 5.00    Types: Cigarettes  . Smokeless tobacco: Never Used  Substance and Sexual Activity  . Alcohol use: No    Alcohol/week: 1.2 oz    Types: 2 Glasses of wine per week    Frequency: Never    Comment: 08/04/2017--states no   . Drug use: No  . Sexual activity: Yes    Birth control/protection: None  Other Topics Concern  . Not on file  Social History Narrative  . Not on file   Review of Systems  HENT: Negative.   Eyes: Negative.   Respiratory: Negative.   Cardiovascular: Negative.  Negative for leg swelling.  Gastrointestinal: Negative.   Endocrine: Negative for polydipsia, polyphagia and polyuria.  Genitourinary: Negative.   Musculoskeletal: Negative.   Neurological: Positive for facial asymmetry, weakness and numbness. Negative for dizziness.  Psychiatric/Behavioral: The patient is nervous/anxious.        Objective:   Physical Exam  Constitutional: She is oriented to person, place, and time.  HENT:  Head: Normocephalic and atraumatic.  Right Ear: External ear normal.  Left Ear: External ear normal.  Nose: Nose normal.  Mouth/Throat: Oropharynx is clear and moist.  Eyes: Conjunctivae and EOM are normal. Pupils are equal, round, and reactive to light.  Neck: Normal range of motion. Neck supple.  Cardiovascular: Normal rate, regular rhythm, normal heart sounds and intact distal pulses.  Pulmonary/Chest: Effort normal and breath sounds normal.  Abdominal: Soft. Bowel sounds are normal.  Neurological: She is alert and oriented to person, place, and time. She  displays tremor. Gait abnormal.  2/5 weakness to left upper extremity 3/5 weakness to left lower extremity   Skin: Skin is warm and dry.  Psychiatric: She has a normal mood and affect. Her behavior is normal. Thought content normal.      BP (!) 146/82 (BP Location: Left Arm, Patient Position: Sitting, Cuff Size: Normal) Comment: manually  Pulse 72   Temp 97.8 F (36.6 C) (Oral)   Resp 16   Ht 5\' 7"  (1.702 m)   Wt 176 lb (79.8 kg)   LMP 05/19/2013   SpO2 100%   BMI 27.57 kg/m  Assessment & Plan:  1. Cerebrovascular accident (CVA) due to thrombosis of precerebral artery ([place]) Follow-up in neurology as scheduled.  Will continue ASA and statin therapy as prescribed. - aspirin 325 MG tablet; Take 1 tablet (325 mg total) by mouth daily.  Dispense: 30 tablet; Refill: 5 - atorvastatin (LIPITOR) 40 MG tablet; Take 1 tablet (40 mg total) by mouth daily at 6 PM.  Dispense: 30 tablet; Refill: 5  2. Chronic renal insufficiency, stage III (moderate) ([place]) Patient warrants a referral to nephrology as payer source becomes available - Comprehensive metabolic panel - CBC with Differential  3. Stenosis of carotid artery, unspecified laterality We will continue statin and ASA therapy and follow-up in cardiology as scheduled.  4. Essential hypertension Blood pressure is above goal on current medication regimen.  Follow up in cardiology as scheduled - furosemide (LASIX) 20 MG tablet; Take 1 tablet (20 mg total) by mouth daily.  Dispense: 30 tablet; Refill: 11 - losartan (COZAAR) 25 MG tablet; Take 1 tablet (25 mg total) by mouth daily.  Dispense: 30 tablet; Refill: 5 - Comprehensive metabolic panel  5. Tobacco dependence Smoking cessation instruction/counseling given:  counseled patient on the dangers of tobacco use, advised patient to stop smoking, and reviewed strategies to maximize success  6. Depression, unspecified depression type Depression screen [facility] 2/9 08/04/2017 08/04/2017  Decreased  Interest 2 0  Down, Depressed, Hopeless 2 0  PHQ - 2 Score 4 0  Altered sleeping 1 -  Tired, decreased energy 2 -  Change in appetite 0 -  Feeling bad or failure about yourself  0 -  Trouble concentrating 0 -  Moving slowly or fidgety/restless 1 -  Suicidal thoughts 0 -  PHQ-9 Score 8 -  Difficult doing work/chores Not difficult at all -   - buPROPion (WELLBUTRIN SR) 150 MG 12 hr tablet; Take 1 tablet (150 mg total) by mouth 2 (two) times daily.  Dispense: 60 tablet; Refill: 5  7. Chronic obstructive pulmonary disease, unspecified COPD type (HCC) - albuterol (PROVENTIL HFA;VENTOLIN HFA) 108 (90 Base) MCG/ACT inhaler; Inhale 2 puffs into the lungs every 6 (six) hours as needed for wheezing or shortness of breath.  Dispense: 1 Inhaler; Refill: 0  8. Renal cyst, left IMPRESSION: 1. No evidence of hydronephrosis. 2. Tiny left renal cysts noted.  9. Left-sided weakness Patient currently has  left-sided weakness, she will benefit from physical therapy evaluation. Also, follow-up in neurology as scheduled  10. Immunization due - Pneumococcal polysaccharide vaccine 23-valent greater than or equal to 2yo subcutaneous/IM    RTC: One month for chronic conditions   Karen Pounds  MSN, FNP-C [facility] [address] [place], [place] [address] [phone] [place]

## 2017-08-05 ENCOUNTER — Other Ambulatory Visit: Payer: Self-pay

## 2017-08-05 ENCOUNTER — Encounter: Payer: Self-pay | Admitting: Cardiology

## 2017-08-05 ENCOUNTER — Telehealth: Payer: Self-pay | Admitting: *Deleted

## 2017-08-05 ENCOUNTER — Ambulatory Visit (INDEPENDENT_AMBULATORY_CARE_PROVIDER_SITE_OTHER): Payer: Self-pay | Admitting: Cardiology

## 2017-08-05 VITALS — BP 138/84 | HR 80 | Ht 67.0 in | Wt 179.8 lb

## 2017-08-05 DIAGNOSIS — I5021 Acute systolic (congestive) heart failure: Secondary | ICD-10-CM

## 2017-08-05 DIAGNOSIS — I10 Essential (primary) hypertension: Secondary | ICD-10-CM

## 2017-08-05 DIAGNOSIS — I429 Cardiomyopathy, unspecified: Secondary | ICD-10-CM

## 2017-08-05 LAB — CBC WITH DIFFERENTIAL/PLATELET
Basophils Absolute: 0 10*3/uL (ref 0.0–0.2)
Basos: 0 %
EOS (ABSOLUTE): 0.1 10*3/uL (ref 0.0–0.4)
Eos: 2 %
HEMATOCRIT: 41.4 % (ref 34.0–46.6)
Hemoglobin: 14 g/dL (ref 11.1–15.9)
IMMATURE GRANS (ABS): 0 10*3/uL (ref 0.0–0.1)
IMMATURE GRANULOCYTES: 0 %
Lymphocytes Absolute: 2.3 10*3/uL (ref 0.7–3.1)
Lymphs: 30 %
MCH: 29 pg (ref 26.6–33.0)
MCHC: 33.8 g/dL (ref 31.5–35.7)
MCV: 86 fL (ref 79–97)
MONOS ABS: 0.3 10*3/uL (ref 0.1–0.9)
Monocytes: 4 %
NEUTROS PCT: 64 %
Neutrophils Absolute: 4.9 10*3/uL (ref 1.4–7.0)
Platelets: 272 10*3/uL (ref 150–379)
RBC: 4.82 x10E6/uL (ref 3.77–5.28)
RDW: 16.1 % — AB (ref 12.3–15.4)
WBC: 7.7 10*3/uL (ref 3.4–10.8)

## 2017-08-05 LAB — COMPREHENSIVE METABOLIC PANEL
ALBUMIN: 4.7 g/dL (ref 3.5–5.5)
ALT: 22 IU/L (ref 0–32)
AST: 25 IU/L (ref 0–40)
Albumin/Globulin Ratio: 1.7 (ref 1.2–2.2)
Alkaline Phosphatase: 149 IU/L — ABNORMAL HIGH (ref 39–117)
BUN/Creatinine Ratio: 14 (ref 9–23)
BUN: 21 mg/dL (ref 6–24)
Bilirubin Total: 0.3 mg/dL (ref 0.0–1.2)
CO2: 21 mmol/L (ref 20–29)
CREATININE: 1.51 mg/dL — AB (ref 0.57–1.00)
Calcium: 9.6 mg/dL (ref 8.7–10.2)
Chloride: 101 mmol/L (ref 96–106)
GFR calc Af Amer: 45 mL/min/{1.73_m2} — ABNORMAL LOW (ref >59)
GFR, EST NON AFRICAN AMERICAN: 39 mL/min/{1.73_m2} — AB (ref >59)
GLOBULIN, TOTAL: 2.7 g/dL (ref 1.5–4.5)
Glucose: 84 mg/dL (ref 65–99)
Potassium: 3.8 mmol/L (ref 3.5–5.2)
SODIUM: 142 mmol/L (ref 134–144)
Total Protein: 7.4 g/dL (ref 6.0–8.5)

## 2017-08-05 MED ORDER — CARVEDILOL 25 MG PO TABS: 25.0000 mg | ORAL_TABLET | Freq: Two times a day (BID) | ORAL | 1 refills | Status: DC

## 2017-08-05 NOTE — Telephone Encounter (Signed)
Patient LVM stating she is no longer with [facility] and to not send many more of her refills to [facility]. She receives her medications from [facility]. [facility] were taken off of her Preferred Pharmacies list.

## 2017-08-05 NOTE — Patient Outreach (Signed)
[facility]) Care Management  08/05/2017  Alfred Eckley Goods February 12, 1966 [id number]   EMMI- Stroke RED ON EMMI ALERT Day # 13 Date:08/03/17 Red Alert Reason: Feeling worse overall- yes New problems walking, talking, speaking, seeing? Yes  2nd telephone call to patient.  No answer.  HIPAA compliant voice message left.  Plan: RN CM will send letter to attempt outreach. If no response within 10 business days will close case.   Jone Baseman, RN, MSN [facility] Care Management Care Management Coordinator Direct Line [phone] Toll Free: [phone]  Fax: [phone]

## 2017-08-05 NOTE — Progress Notes (Signed)
08/05/2017 Karen Ochoa   02/14/1966  [id number]  Primary Physician [name], FNP Primary Cardiologist: Dr. Acie Fredrickson   Reason for Visit/CC: Post hospital f/u for Acute Systolic HF  HPI: 52 y.o. AA female with a medical history significant ofHTN, prior TIAs, moderate carotid disease, tobacco abuse and CRI-3. Patient recently presented to the ED 07/18/17 with c/o high blood pressure and onset of LUE and LLE numbness and weakness. 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 BP noted to be very high on admission, in the setting of medication noncompliance. Echo showed LVD w/ EF of 30-35% and diffuse hypokinesis. Tele showed NSR and no arythmias. Carotid dopplers1-39% bilaterally. Hgb A1c was 5.9. Neuro consulted and added ASA and statin. BP meds restarted and BP control achieved. Cardiology consulted for systolic HF. Pt seen by Dr. Cathie Olden and was w/o any cardiac symptoms. Medical management with BB and ARB recommended. Coreg and Losartan added. He also recommended ischemic w/u with R-LHC, however wanted to wait to allow time for her to heal from her stroke.  Pt now presents to clinic for [facility] post hospital f/u. Phone call was made. Pt notes she is doing well but still has some mild residual LUE numbness. She is ambulating with a cane. BP is well controlled at 138/84. Pulse rate is 80 bpm. She reports full med compliance. She has quit smoking. She has some occasional nocturnal chest tightness, since she quit smoking but denies any exertional symptoms. Volume is stable. No edema.   2D Echo 07/20/2016 Study Conclusions  - Left ventricle: The cavity size was normal. There was severe   concentric hypertrophy. Systolic function was moderately to   severely reduced. The estimated ejection fraction was in the   range of 30% to 35%. Diffuse hypokinesis. Doppler parameters are   consistent with abnormal left ventricular relaxation (grade 1   diastolic dysfunction). Doppler parameters are consistent with  elevated ventricular end-diastolic filling pressure. - Aortic valve: There was trivial regurgitation. - Aortic root: The aortic root was normal in size. - Mitral valve: There was mild regurgitation directed posteriorly. - Left atrium: The atrium was mildly dilated. - Right ventricle: The cavity size was normal. Wall thickness was   normal. Systolic function was normal. - Right atrium: The atrium was normal in size. - Tricuspid valve: There was mild regurgitation. - Pulmonic valve: There was trivial regurgitation. - Pulmonary arteries: Systolic pressure was within the normal   range. - Inferior vena cava: The vessel was normal in size. - Pericardium, extracardiac: There was no pericardial effusion.  Impressions:  - Severe concentric LVH with diffuse hypokinesis and LVEF 30-35%. A   cardiac MRI is recommended to differentiate hypertensive vs   infiltrative cardiomyopathy.  No outpatient medications have been marked as taking for the 08/05/17 encounter (Office Visit) with [name], PA-C.   Allergies  Allergen Reactions  . Penicillins Anaphylaxis    Has patient had a PCN reaction causing immediate rash, facial/tongue/throat swelling, SOB or lightheadedness with hypotension: Yes Has patient had a PCN reaction causing severe rash involving mucus membranes or skin necrosis: Yes Has patient had a PCN reaction that required hospitalization Yes Has patient had a PCN reaction occurring within the last 10 years: No If all of the above answers are "NO", then may proceed with Cephalosporin use.   . Sulfonamide Derivatives Anaphylaxis  . Clonidine Derivatives Other (See Comments)    Patient passed out shortly after ingesting this at a doctor's office on 05/08/16 (was taken  in conjunction with a tablet of Labetalol.)  . Flagyl [Metronidazole] Swelling and Other (See Comments)    Skin blisters  . Labetalol Other (See Comments)    Patient passed out shortly after ingesting this at a  doctor's office on 05/08/16 (was taken in conjunction with a tablet of Clonidine.)   Past Medical History:  Diagnosis Date  . Asthma 2017  . Fibroids   . GERD (gastroesophageal reflux disease)   . Headache    "weekly" (07/20/2017)  . High cholesterol 07/18/2017  . Hx MRSA infection   . Hypertension   . Insomnia due to medical condition   . Stroke ([place]) 07/18/2017  . TIA (transient ischemic attack)    "I've had several"   Family History  Problem Relation Age of Onset  . Hypertension Mother   . Diabetes Sister   . Asthma Brother   . Diabetes Brother   . Anesthesia problems Neg Hx    Past Surgical History:  Procedure Laterality Date  . WISDOM TOOTH EXTRACTION     Social History   Socioeconomic History  . Marital status: Single    Spouse name: Not on file  . Number of children: Not on file  . Years of education: Not on file  . Highest education level: Not on file  Social Needs  . Financial resource strain: Not on file  . Food insecurity - worry: Not on file  . Food insecurity - inability: Not on file  . Transportation needs - medical: Not on file  . Transportation needs - non-medical: Not on file  Occupational History  . Not on file  Tobacco Use  . Smoking status: Current Every Day Smoker    Packs/day: 0.50    Years: 10.00    Pack years: 5.00    Types: Cigarettes  . Smokeless tobacco: Never Used  Substance and Sexual Activity  . Alcohol use: No    Alcohol/week: 1.2 oz    Types: 2 Glasses of wine per week    Frequency: Never    Comment: 08/04/2017--states no   . Drug use: No  . Sexual activity: Yes    Birth control/protection: None  Other Topics Concern  . Not on file  Social History Narrative  . Not on file     Review of Systems: General: negative for chills, fever, night sweats or weight changes.  Cardiovascular: negative for chest pain, dyspnea on exertion, edema, orthopnea, palpitations, paroxysmal nocturnal dyspnea or shortness of  breath Dermatological: negative for rash Respiratory: negative for cough or wheezing Urologic: negative for hematuria Abdominal: negative for nausea, vomiting, diarrhea, bright red blood per rectum, melena, or hematemesis Neurologic: negative for visual changes, syncope, or dizziness All other systems reviewed and are otherwise negative except as noted above.   Physical Exam:  Blood pressure 138/84, pulse 80, height 5\' 7"  (1.702 m), weight 179 lb 12.8 oz (81.6 kg), last menstrual period 05/19/2013, SpO2 99 %.  General appearance: alert, cooperative and no distress Neck: no carotid bruit and no JVD Lungs: clear to auscultation bilaterally Heart: regular rate and rhythm, S1, S2 normal, no murmur, click, rub or gallop Extremities: extremities normal, atraumatic, no cyanosis or edema Pulses: 2+ and symmetric Skin: Skin color, texture, turgor normal. No rashes or lesions Neurologic: Grossly normal  EKG not performed -- personally reviewed   ASSESSMENT AND PLAN:   1. Systolic HF: new diagnosis. Recent echo showed EF of 30-35% with diffuse hypokinesis. Pt will need R/LHC, however given recent CVA we will continue to  allow time for her to recover before pursuing this. I've discussed case with Dr. Cathie Olden today and he recommends waiting another 6-8 weeks. In the meantime, we will continue medical management. Pt is stable from a symptom/ volume standpoint. There is room in BP and HR to further titrate HF meds. We will increase Coreg to 25 mg BID. Continue current dose of Losartan. Also continue Lasix. We discussed importance to daily weights and salt restriction.   2. CVA: still with LUE numbness. No other deficits. On ASA and statin. BP is better controlled.   3. HTN: controlled on current regimen but room to further increase HF meds.  Will increase Coreg to 25 mg BID.   4. Tobacco Abuse: pt recently quit smoking, after 10 years. Pt congratulated on her efforts. PCP has started Wellbutrin.    Follow-Up in 6 weeks. Will re-evaluate for [facility] at that time. Pt instructed to notify our office and arrange a sooner f/u visit if she develops any cardiac symptoms.   Brittainy Ladoris Gene, MHS [facility] 08/05/2017 10:13 AM

## 2017-08-05 NOTE — Patient Instructions (Addendum)
Medication Instructions:  Your physician has recommended you make the following change in your medication:  1.  INCREASE the Carvedilol to 25 mg taking 1 tablet twice a day (you may take 2 of the 12.5s until you use them up)  Labwork: None ordered  Testing/Procedures: None ordered  Follow-Up: Your physician recommends that you schedule a follow-up appointment in: [place], PA-C   Any Other Special Instructions Will Be Listed Below (If Applicable). If you begin to have worsening chest pains upon doing activity, call us for a sooner appointment.    If you need a refill on your cardiac medications before your next appointment, please call your pharmacy.

## 2017-08-19 ENCOUNTER — Other Ambulatory Visit: Payer: Self-pay

## 2017-08-19 NOTE — Patient Outreach (Signed)
[place] [facility]) Care Management  08/19/2017  Jenni Thew Goods [birth year] [id number]   Multiple attempts to establish contact with patient without success. No response from letter mailed to patient. Case is being closed at this time.   Plan: RN CM will notify [facility] administrative assistant of case status.  Jone Baseman, RN, MSN [facility] Care Management Care Management Coordinator Direct Line [phone] Toll Free: [phone]  Fax: [phone]

## 2017-08-21 NOTE — Addendum Note (Signed)
Encounter addended by: Danielle Dess, RN on: 08/21/2017 2:07 AM  Actions taken: Flowsheet accepted

## 2017-09-06 ENCOUNTER — Ambulatory Visit (INDEPENDENT_AMBULATORY_CARE_PROVIDER_SITE_OTHER): Payer: Self-pay | Admitting: Family Medicine

## 2017-09-06 ENCOUNTER — Encounter: Payer: Self-pay | Admitting: Family Medicine

## 2017-09-06 VITALS — BP 142/88 | HR 60 | Temp 98.2°F | Resp 14 | Ht 67.0 in | Wt 179.0 lb

## 2017-09-06 DIAGNOSIS — Z23 Encounter for immunization: Secondary | ICD-10-CM

## 2017-09-06 DIAGNOSIS — F17200 Nicotine dependence, unspecified, uncomplicated: Secondary | ICD-10-CM

## 2017-09-06 DIAGNOSIS — G4700 Insomnia, unspecified: Secondary | ICD-10-CM

## 2017-09-06 DIAGNOSIS — Z1159 Encounter for screening for other viral diseases: Secondary | ICD-10-CM

## 2017-09-06 DIAGNOSIS — I5022 Chronic systolic (congestive) heart failure: Secondary | ICD-10-CM

## 2017-09-06 DIAGNOSIS — I10 Essential (primary) hypertension: Secondary | ICD-10-CM

## 2017-09-06 DIAGNOSIS — N183 Chronic kidney disease, stage 3 unspecified: Secondary | ICD-10-CM

## 2017-09-06 MED ORDER — TRAZODONE HCL 50 MG PO TABS: 25.0000 mg | ORAL_TABLET | Freq: Every evening | ORAL | 1 refills | Status: DC | PRN

## 2017-09-06 NOTE — Progress Notes (Signed)
Subjective:    Patient ID: Karen Ochoa, female    DOB: December 31, 1965, 52 y.o.   MRN: [id number]  HPI Karen Ochoa, a 52 year old female with a history of CHF, CVA, HCN, CAD, and CKD3  presents for a 1 month follow up of hypertension and depression.  She has a history that is significant for CVA and uncontrolled hypertension.  Patient recently established care with cardiology and antihypertensive medications were increased for greater control. Karen Ochoa says that she has been taking medications consistently.  Patient has a history of CVA.  Patient had a MRI which confirmed an acute-subacute ischemic pontine stroke.  She on statin and ASA therapy. She is scheduled to establish care with neurology. She has left sided weakness and continues to ambulate with a cane.  Patient also has slurred speech.  Patient also has a history of stage III chronic kidney disease.  She is not under the care of nephrology.  Patient does not exercise routinely or follow a low-fat, low-sodium diet.  Karen Ochoa does not check blood pressures at home.    Patient has a history of depression. She was started on Welbutrin 1 month ago. She says that she has not noticed improvement since starting medication. She reports feelings of  anhedonia and hopelessness. She also endorses insomnia. She has difficulty falling asleep and remaining asleep due to racing thoughts. Patient denies suicidal or homicidal ideations.   Past Medical History:  Diagnosis Date  . Asthma 2017  . Fibroids   . GERD (gastroesophageal reflux disease)   . Headache    "weekly" (07/20/2017)  . High cholesterol 07/18/2017  . Hx MRSA infection   . Hypertension   . Insomnia due to medical condition   . Stroke ([place]) 07/18/2017  . TIA (transient ischemic attack)    "I've had several"   Social History   Socioeconomic History  . Marital status: Single    Spouse name: Not on file  . Number of children: Not on file  . Years of education: Not on file  . Highest  education level: Not on file  Social Needs  . Financial resource strain: Not on file  . Food insecurity - worry: Not on file  . Food insecurity - inability: Not on file  . Transportation needs - medical: Not on file  . Transportation needs - non-medical: Not on file  Occupational History  . Not on file  Tobacco Use  . Smoking status: Current Every Day Smoker    Packs/day: 0.50    Years: 10.00    Pack years: 5.00    Types: Cigarettes  . Smokeless tobacco: Never Used  Substance and Sexual Activity  . Alcohol use: No    Alcohol/week: 1.2 oz    Types: 2 Glasses of wine per week    Frequency: Never    Comment: 08/04/2017--states no   . Drug use: No  . Sexual activity: Yes    Birth control/protection: None  Other Topics Concern  . Not on file  Social History Narrative  . Not on file   Immunization History  Administered Date(s) Administered  . Pneumococcal Polysaccharide-23 08/04/2017    Review of Systems  HENT: Negative.   Eyes: Negative.   Respiratory: Negative.   Cardiovascular: Negative.  Negative for leg swelling.  Gastrointestinal: Negative.   Endocrine: Negative for polydipsia, polyphagia and polyuria.  Genitourinary: Negative.   Musculoskeletal: Negative.   Neurological: Positive for facial asymmetry, weakness and numbness. Negative for dizziness.  Psychiatric/Behavioral: Positive for  depression. The patient is nervous/anxious.        Objective:   Physical Exam  Constitutional: She is oriented to person, place, and time.  HENT:  Head: Normocephalic and atraumatic.  Right Ear: External ear normal.  Left Ear: External ear normal.  Nose: Nose normal.  Mouth/Throat: Oropharynx is clear and moist.  Eyes: Conjunctivae and EOM are normal. Pupils are equal, round, and reactive to light.  Neck: Normal range of motion. Neck supple.  Cardiovascular: Normal rate, regular rhythm, normal heart sounds and intact distal pulses.  Pulmonary/Chest: Effort normal and breath  sounds normal.  Abdominal: Soft. Bowel sounds are normal.  Neurological: She is alert and oriented to person, place, and time. She displays tremor. Gait abnormal.  2/5 weakness to left upper extremity 3/5 weakness to left lower extremity   Skin: Skin is warm and dry.  Psychiatric: She has a normal mood and affect. Her behavior is normal. Thought content normal.      BP (!) 142/88 (BP Location: Left Arm, Patient Position: Sitting, Cuff Size: Normal) Comment: MANUALLY  Pulse 60   Temp 98.2 F (36.8 C) (Oral)   Resp 14   Ht 5\' 7"  (1.702 m)   Wt 179 lb (81.2 kg)   LMP 05/19/2013   SpO2 100%   BMI 28.04 kg/m  Assessment & Plan:  1. Chronic systolic CHF (congestive heart failure) (HCC) Continue Coreg 25 mg BID and furosemide 20 mg daily. Reminded of the importance of following up in cardiology as scheduled.   2. Chronic renal insufficiency, stage III (moderate) (HCC) Most recent GFR is 45, which is consistent with CKD, will continue to monitor closely - Basic Metabolic Panel  3. Tobacco dependence Discussed the importance of smoking cessation due to varying medical conditions. Patient says that she is attempting to cut down on her smoking. She says that she has not been able to afford cigarettes,which has helped with smoking cessation.   4. Essential hypertension Blood pressure has improved on current blood pressure medications Continue medication, monitor blood pressure at home. Continue DASH diet. Reminder to go to the ER if any CP, SOB, nausea, dizziness, severe HA, changes vision/speech, left arm numbness and tingling and jaw pain.  5. Insomnia, unspecified type Will start a trial of trazodone 50 mg HS.  - traZODone (DESYREL) 50 MG tablet; Take 0.5 tablets (25 mg total) by mouth at bedtime as needed for sleep.  Dispense: 30 tablet; Refill: 1  6. Need for Tdap vaccination - Tdap vaccine greater than or equal to 7yo IM  7. Need for hepatitis C screening test - Hepatitis C  Antibody   RTC: 2 months for chronic conditions   Donia Pounds  MSN, FNP-C Patient [place] [address] [place], [place] [address] [phone]

## 2017-09-07 ENCOUNTER — Telehealth: Payer: Self-pay

## 2017-09-07 LAB — BASIC METABOLIC PANEL
BUN/Creatinine Ratio: 15 (ref 9–23)
BUN: 24 mg/dL (ref 6–24)
CALCIUM: 9.5 mg/dL (ref 8.7–10.2)
CHLORIDE: 106 mmol/L (ref 96–106)
CO2: 21 mmol/L (ref 20–29)
Creatinine, Ser: 1.59 mg/dL — ABNORMAL HIGH (ref 0.57–1.00)
GFR, EST AFRICAN AMERICAN: 42 mL/min/{1.73_m2} — AB (ref >59)
GFR, EST NON AFRICAN AMERICAN: 37 mL/min/{1.73_m2} — AB (ref >59)
Glucose: 92 mg/dL (ref 65–99)
POTASSIUM: 3.9 mmol/L (ref 3.5–5.2)
SODIUM: 144 mmol/L (ref 134–144)

## 2017-09-07 LAB — HEPATITIS C ANTIBODY: Hep C Virus Ab: <0.1 s/co ratio (ref 0.0–0.9)

## 2017-09-07 NOTE — Telephone Encounter (Signed)
-----   Message from [name], [place] sent at 09/07/2017  6:23 AM EST ----- Regarding: lab results Please inform patient that creatinine level is elevated, which is consistent with stage 3 chronic kidney disease. Will send referral to nephrology as her payer source becomes available. All other results unremarkable.   Thanks

## 2017-09-07 NOTE — Telephone Encounter (Signed)
Called, no answer. Left a message for patient to return call. Thanks!  

## 2017-09-08 NOTE — Telephone Encounter (Signed)
Called and spoke with patient and advised that creatine is elevated which is consistent with stage 3 chronic kidney disease. Advised that we will send into nephrology  as soon as payer source becomes avaliable. All other results are unremarkable. Thanks!

## 2017-09-09 NOTE — Patient Instructions (Signed)
Will add Trazodone 50 mg at bedtime for insomnia.  Continue Wellbutrin 150 mg twice daily for depression Follow up in neurology as scheduled  Will follow up by phone with any abnormal laboratory results   Practice good sleep hygiene.  Stick to a sleep schedule, even on weekends. Exercise is great, but not too late in the day Avoid alcoholic drinks before bed Avoid large meals and beverages late before bed Don't take naps after 3 pm. Keep power naps less than 1 hour.  Relax before bed.  Take a hot bath before bed.  Have a good sleeping environment. Get rid of anything in your bedroom that might distract you from sleep.  Adopt good sleeping posture.

## 2017-09-14 ENCOUNTER — Other Ambulatory Visit: Payer: Self-pay | Admitting: Family Medicine

## 2017-09-14 DIAGNOSIS — J449 Chronic obstructive pulmonary disease, unspecified: Secondary | ICD-10-CM

## 2017-09-23 ENCOUNTER — Ambulatory Visit (INDEPENDENT_AMBULATORY_CARE_PROVIDER_SITE_OTHER): Payer: Self-pay | Admitting: Cardiology

## 2017-09-23 ENCOUNTER — Encounter: Payer: Self-pay | Admitting: Cardiology

## 2017-09-23 VITALS — BP 126/78 | HR 70 | Ht 67.0 in | Wt 179.4 lb

## 2017-09-23 DIAGNOSIS — I429 Cardiomyopathy, unspecified: Secondary | ICD-10-CM

## 2017-09-23 DIAGNOSIS — I5020 Unspecified systolic (congestive) heart failure: Secondary | ICD-10-CM

## 2017-09-23 NOTE — H&P (View-Only) (Signed)
09/23/2017 Karen Ochoa   Dec 06, 1965  [id number]  Primary Physician [name], FNP Primary Cardiologist: Dr. Acie Fredrickson   Reason for Visit/CC: Systolic HF/ Cardiomyopathy  HPI:  52 y.o.AA femalewithamedical history significant ofHTN, prior TIAs, moderate carotid disease, tobacco abuse and CRI-3. Patient recently presentedto the ED 12/30/18with c/o high blood pressure and onset of LUE and LLE numbness and weakness. 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 Echo showed LVD w/ EF of 30-35% and diffuse hypokinesis. Tele showed NSR and no arythmias. Carotid dopplers1-39% bilaterally. Hgb A1c was 5.9. Neuro consulted and added ASA and statin. BP meds restarted and BP control achieved. Cardiology consulted for systolic HF. Pt seen by Dr. Cathie Olden and was w/o any cardiac symptoms. Medical management with BB and ARB recommended. Coreg and Losartan added. He also recommended ischemic w/u with R-LHC, however wanted to wait to allow time for her to heal from her stroke.  I evaluated pt for TOC post hospital f/u on 08/05/17. She was doing fairly well at that time but still had some mild residual LUE numbness. She noted at that time that she had quit smoking. She had also noted some occasional nocturnal chest tightness, since she quit smoking but denied any exertional symptoms. Her volume is was stable. She had no edema. Dr. Acie Fredrickson was in clinic that day and I discussed with him ischemic w/u. He recommended further medical management and recommended waiting another 6 weeks before pursing cath. At that visit, her Coreg was further titrated to 25 mg BID.   Today in f/u, she has done well from a cardiac standpoint. She denies CP. No dyspnea, orthopnea, PND or LEE. Her upper extremity numbness has resolved. She reports full compliance with medications. Unfortunately, she has picked back up smoking, but not daily. BP is well controlled.  She is having URI symptoms w/ nasal congestion and nonproductive cough. She denies fever, chills and myalgias. She got her pneumococcal vaccine but did not get a flu short.  We discussed cath and pt is in agreement to proceed.   Cardiac Studies  2D Echo 07/20/2017 Study Conclusions  - Left ventricle: The cavity size was normal. There was severe   concentric hypertrophy. Systolic function was moderately to   severely reduced. The estimated ejection fraction was in the   range of 30% to 35%. Diffuse hypokinesis. Doppler parameters are   consistent with abnormal left ventricular relaxation (grade 1   diastolic dysfunction). Doppler parameters are consistent with   elevated ventricular end-diastolic filling pressure. - Aortic valve: There was trivial regurgitation. - Aortic root: The aortic root was normal in size. - Mitral valve: There was mild regurgitation directed posteriorly. - Left atrium: The atrium was mildly dilated. - Right ventricle: The cavity size was normal. Wall thickness was   normal. Systolic function was normal. - Right atrium: The atrium was normal in size. - Tricuspid valve: There was mild regurgitation. - Pulmonic valve: There was trivial regurgitation. - Pulmonary arteries: Systolic pressure was within the normal   range. - Inferior vena cava: The vessel was normal in size. - Pericardium, extracardiac: There was no pericardial effusion.  Impressions:  - Severe concentric LVH with diffuse hypokinesis and LVEF 30-35%. A   cardiac MRI is recommended to differentiate hypertensive vs   infiltrative cardiomyopathy.   Current Meds  Medication Sig  . aspirin 325 MG tablet Take 1 tablet (325 mg total) by mouth daily.  Marland Kitchen  atorvastatin (LIPITOR) 40 MG tablet Take 1 tablet (40 mg total) by mouth daily at 6 PM.  . buPROPion (WELLBUTRIN SR) 150 MG 12 hr tablet Take 1 tablet (150 mg total) by mouth 2 (two) times daily.  . carvedilol (COREG) 25 MG tablet Take 1 tablet (25 mg  total) by mouth 2 (two) times daily.  . furosemide (LASIX) 20 MG tablet Take 1 tablet (20 mg total) by mouth daily.  Marland Kitchen losartan (COZAAR) 25 MG tablet Take 1 tablet (25 mg total) by mouth daily.  . traZODone (DESYREL) 50 MG tablet Take 0.5 tablets (25 mg total) by mouth at bedtime as needed for sleep.  . VENTOLIN HFA 108 (90 Base) MCG/ACT inhaler INHALE 2 PUFFS INTO THE LUNGS EVERY 6 (SIX) HOURS AS NEEDED FOR WHEEZING OR SHORTNESS OF BREATH.   Allergies  Allergen Reactions  . Penicillins Anaphylaxis    Has patient had a PCN reaction causing immediate rash, facial/tongue/throat swelling, SOB or lightheadedness with hypotension: Yes Has patient had a PCN reaction causing severe rash involving mucus membranes or skin necrosis: Yes Has patient had a PCN reaction that required hospitalization Yes Has patient had a PCN reaction occurring within the last 10 years: No If all of the above answers are "NO", then may proceed with Cephalosporin use.   . Sulfonamide Derivatives Anaphylaxis  . Clonidine Derivatives Other (See Comments)    Patient passed out shortly after ingesting this at a doctor's office on 05/08/16 (was taken in conjunction with a tablet of Labetalol.)  . Flagyl [Metronidazole] Swelling and Other (See Comments)    Skin blisters  . Labetalol Other (See Comments)    Patient passed out shortly after ingesting this at a doctor's office on 05/08/16 (was taken in conjunction with a tablet of Clonidine.)   Past Medical History:  Diagnosis Date  . Asthma 2017  . Fibroids   . GERD (gastroesophageal reflux disease)   . Headache    "weekly" (07/20/2017)  . High cholesterol 07/18/2017  . Hx MRSA infection   . Hypertension   . Insomnia due to medical condition   . Stroke ([place]) 07/18/2017  . TIA (transient ischemic attack)    "I've had several"   Family History  Problem Relation Age of Onset  . Hypertension Mother   . Diabetes Sister   . Asthma Brother   . Diabetes Brother   .  Anesthesia problems Neg Hx    Past Surgical History:  Procedure Laterality Date  . WISDOM TOOTH EXTRACTION     Social History   Socioeconomic History  . Marital status: Single    Spouse name: Not on file  . Number of children: Not on file  . Years of education: Not on file  . Highest education level: Not on file  Social Needs  . Financial resource strain: Not on file  . Food insecurity - worry: Not on file  . Food insecurity - inability: Not on file  . Transportation needs - medical: Not on file  . Transportation needs - non-medical: Not on file  Occupational History  . Not on file  Tobacco Use  . Smoking status: Current Every Day Smoker    Packs/day: 0.50    Years: 10.00    Pack years: 5.00    Types: Cigarettes  . Smokeless tobacco: Never Used  Substance and Sexual Activity  . Alcohol use: No    Alcohol/week: 1.2 oz    Types: 2 Glasses of wine per week    Frequency: Never  Comment: 08/04/2017--states no   . Drug use: No  . Sexual activity: Yes    Birth control/protection: None  Other Topics Concern  . Not on file  Social History Narrative  . Not on file     Review of Systems: General: negative for chills, fever, night sweats or weight changes.  Cardiovascular: negative for chest pain, dyspnea on exertion, edema, orthopnea, palpitations, paroxysmal nocturnal dyspnea or shortness of breath Dermatological: negative for rash Respiratory: negative for cough or wheezing Urologic: negative for hematuria Abdominal: negative for nausea, vomiting, diarrhea, bright red blood per rectum, melena, or hematemesis Neurologic: negative for visual changes, syncope, or dizziness All other systems reviewed and are otherwise negative except as noted above.   Physical Exam:  Blood pressure 126/78, pulse 70, height 5\' 7"  (1.702 m), weight 179 lb 6.4 oz (81.4 kg), last menstrual period 05/19/2013, SpO2 97 %.  General appearance: alert, cooperative and no distress Neck: no carotid  bruit and no JVD Lungs: clear to auscultation bilaterally Heart: regular rate and rhythm, S1, S2 normal, no murmur, click, rub or gallop Extremities: extremities normal, atraumatic, no cyanosis or edema Pulses: 2+ and symmetric Skin: Skin color, texture, turgor normal. No rashes or lesions Neurologic: Grossly normal  EKG not performed -- personally reviewed   ASSESSMENT AND PLAN:   1. Systolic HF/ Cardiomyopathy: new diagnosis. Recent echo showed EF of 30-35% with diffuse hypokinesis, in the setting of poorly controlled HTN and CVA. However pt with other cardiac risk factors including tobacco use. As recommend by Dr. Acie Fredrickson, we have waited 6-8 weeks since her stroke. We will plan for [facility] to help classify her cardiomyopathy as ischemic vs nonischemic and r/o CAD. I have reviewed the risks, indications, and alternatives to cardiac catheterization and possible angioplasty/stenting with the patient. Risks include but are not limited to bleeding, infection, vascular injury, stroke, myocardial infection, arrhythmia, kidney injury, radiation-related injury in the case of prolonged fluoroscopy use, emergency cardiac surgery, and death. The patient understands the risks of serious complication is low (<0%). We will obtain precath labs today. Continue medical therapy with ARB and BB. BP is well controlled. Volume is stable. Euvolemic on exam. We will allow time for pt to recover from her URI before performing cath. Will plan week of 10/04/17.  2. CVA: neurological deficits resolved. BP is well controlled. Continue ASA and statin therapy.   3. HTN: controlled on current regimen. She is tolerating higher dose of Coreg w/o issues. Continue Coreg 25 mg BID, Losartan 25 mg and Lasix 20 mg daily. We will repeat BMP today.   4. Tobacco Use: pt has cut down but still smoking. Complete smoking cessation advised.    Follow-Up post [facility].   Maya Scholer Ladoris Gene, MHS [facility] 09/23/2017 1:57 PM

## 2017-09-23 NOTE — Patient Instructions (Addendum)
Medication Instructions:  Your physician recommends that you continue on your current medications as directed. Please refer to the Current Medication list given to you today.   Labwork: TODAY:  BMET, CBC, & PT/INR  Testing/Procedures: Your physician has requested that you have a cardiac catheterization. Cardiac catheterization is used to diagnose and/or treat various heart conditions. Doctors may recommend this procedure for a number of different reasons. The most common reason is to evaluate chest pain. Chest pain can be a symptom of coronary artery disease (CAD), and cardiac catheterization can show whether plaque is narrowing or blocking your heart's arteries. This procedure is also used to evaluate the valves, as well as measure the blood flow and oxygen levels in different parts of your heart. For further information please visit [email or web address]. Please follow instruction sheet, as given.    Follow-Up: Your physician recommends that you schedule a follow-up appointment in: 10/19/17 ARRIVE AT 12:45 TO SEE MICHELE LENZE, PA-C   Any Other Special Instructions Will Be Listed Below (If Applicable).    [place] [facility] [address], [place] [address] [place] [address] Dept: [phone] Loc: [phone]  ARIAN MCQUITTY  09/23/2017  You are scheduled for a Cardiac Catheterization on Tuesday, March 19 with Dr. Daneen Schick.  1. Please arrive at the [facility] (Main Entrance A) at [facility]: [address] [place], [place] [address] at 8:00 AM (two hours before your procedure to ensure your preparation). Free valet parking service is available.   Special note: Every effort is made to have your procedure done on time. Please understand that emergencies sometimes delay scheduled procedures.  2. Diet: Do not eat or drink anything after midnight prior to your procedure except sips of water to take  medications.  3. Labs: WILL BE DRAWN TODAY   4. Medication instructions in preparation for your procedure:   DO NOT TAKE LOSARTAN OR LASIX ON THE MORNING OF YOUR PROCEDURE   On the morning of your procedure, take your Aspirin and any morning medicines NOT listed above.  You may use sips of water.  5. Plan for one night stay--bring personal belongings. 6. Bring a current list of your medications and current insurance cards. 7. You MUST have a responsible person to drive you home. 8. Someone MUST be with you the first 24 hours after you arrive home or your discharge will be delayed. 9. Please wear clothes that are easy to get on and off and wear slip-on shoes.  Thank you for allowing [place] to care for you!   -- [facility]    If you need a refill on your cardiac medications before your next appointment, please call your pharmacy.

## 2017-09-23 NOTE — Progress Notes (Signed)
09/23/2017 Karen Ochoa   March 12, 1966  [id number]  Primary Physician [name], FNP Primary Cardiologist: Dr. Acie Fredrickson   Reason for Visit/CC: Systolic HF/ Cardiomyopathy  HPI:  52 y.o.AA femalewithamedical history significant ofHTN, prior TIAs, moderate carotid disease, tobacco abuse and CRI-3. Patient recently presentedto the ED 12/30/18with c/o high blood pressure and onset of LUE and LLE numbness and weakness. 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 Echo showed LVD w/ EF of 30-35% and diffuse hypokinesis. Tele showed NSR and no arythmias. Carotid dopplers1-39% bilaterally. Hgb A1c was 5.9. Neuro consulted and added ASA and statin. BP meds restarted and BP control achieved. Cardiology consulted for systolic HF. Pt seen by Dr. Cathie Olden and was w/o any cardiac symptoms. Medical management with BB and ARB recommended. Coreg and Losartan added. He also recommended ischemic w/u with R-LHC, however wanted to wait to allow time for her to heal from her stroke.  I evaluated pt for TOC post hospital f/u on 08/05/17. She was doing fairly well at that time but still had some mild residual LUE numbness. She noted at that time that she had quit smoking. She had also noted some occasional nocturnal chest tightness, since she quit smoking but denied any exertional symptoms. Her volume is was stable. She had no edema. Dr. Acie Fredrickson was in clinic that day and I discussed with him ischemic w/u. He recommended further medical management and recommended waiting another 6 weeks before pursing cath. At that visit, her Coreg was further titrated to 25 mg BID.   Today in f/u, she has done well from a cardiac standpoint. She denies CP. No dyspnea, orthopnea, PND or LEE. Her upper extremity numbness has resolved. She reports full compliance with medications. Unfortunately, she has picked back up smoking, but not daily. BP is well controlled.  She is having URI symptoms w/ nasal congestion and nonproductive cough. She denies fever, chills and myalgias. She got her pneumococcal vaccine but did not get a flu short.  We discussed cath and pt is in agreement to proceed.   Cardiac Studies  2D Echo 07/20/2017 Study Conclusions  - Left ventricle: The cavity size was normal. There was severe   concentric hypertrophy. Systolic function was moderately to   severely reduced. The estimated ejection fraction was in the   range of 30% to 35%. Diffuse hypokinesis. Doppler parameters are   consistent with abnormal left ventricular relaxation (grade 1   diastolic dysfunction). Doppler parameters are consistent with   elevated ventricular end-diastolic filling pressure. - Aortic valve: There was trivial regurgitation. - Aortic root: The aortic root was normal in size. - Mitral valve: There was mild regurgitation directed posteriorly. - Left atrium: The atrium was mildly dilated. - Right ventricle: The cavity size was normal. Wall thickness was   normal. Systolic function was normal. - Right atrium: The atrium was normal in size. - Tricuspid valve: There was mild regurgitation. - Pulmonic valve: There was trivial regurgitation. - Pulmonary arteries: Systolic pressure was within the normal   range. - Inferior vena cava: The vessel was normal in size. - Pericardium, extracardiac: There was no pericardial effusion.  Impressions:  - Severe concentric LVH with diffuse hypokinesis and LVEF 30-35%. A   cardiac MRI is recommended to differentiate hypertensive vs   infiltrative cardiomyopathy.   Current Meds  Medication Sig  . aspirin 325 MG tablet Take 1 tablet (325 mg total) by mouth daily.  Marland Kitchen  atorvastatin (LIPITOR) 40 MG tablet Take 1 tablet (40 mg total) by mouth daily at 6 PM.  . buPROPion (WELLBUTRIN SR) 150 MG 12 hr tablet Take 1 tablet (150 mg total) by mouth 2 (two) times daily.  . carvedilol (COREG) 25 MG tablet Take 1 tablet (25 mg  total) by mouth 2 (two) times daily.  . furosemide (LASIX) 20 MG tablet Take 1 tablet (20 mg total) by mouth daily.  Marland Kitchen losartan (COZAAR) 25 MG tablet Take 1 tablet (25 mg total) by mouth daily.  . traZODone (DESYREL) 50 MG tablet Take 0.5 tablets (25 mg total) by mouth at bedtime as needed for sleep.  . VENTOLIN HFA 108 (90 Base) MCG/ACT inhaler INHALE 2 PUFFS INTO THE LUNGS EVERY 6 (SIX) HOURS AS NEEDED FOR WHEEZING OR SHORTNESS OF BREATH.   Allergies  Allergen Reactions  . Penicillins Anaphylaxis    Has patient had a PCN reaction causing immediate rash, facial/tongue/throat swelling, SOB or lightheadedness with hypotension: Yes Has patient had a PCN reaction causing severe rash involving mucus membranes or skin necrosis: Yes Has patient had a PCN reaction that required hospitalization Yes Has patient had a PCN reaction occurring within the last 10 years: No If all of the above answers are "NO", then may proceed with Cephalosporin use.   . Sulfonamide Derivatives Anaphylaxis  . Clonidine Derivatives Other (See Comments)    Patient passed out shortly after ingesting this at a doctor's office on 05/08/16 (was taken in conjunction with a tablet of Labetalol.)  . Flagyl [Metronidazole] Swelling and Other (See Comments)    Skin blisters  . Labetalol Other (See Comments)    Patient passed out shortly after ingesting this at a doctor's office on 05/08/16 (was taken in conjunction with a tablet of Clonidine.)   Past Medical History:  Diagnosis Date  . Asthma 2017  . Fibroids   . GERD (gastroesophageal reflux disease)   . Headache    "weekly" (07/20/2017)  . High cholesterol 07/18/2017  . Hx MRSA infection   . Hypertension   . Insomnia due to medical condition   . Stroke ([place]) 07/18/2017  . TIA (transient ischemic attack)    "I've had several"   Family History  Problem Relation Age of Onset  . Hypertension Mother   . Diabetes Sister   . Asthma Brother   . Diabetes Brother   .  Anesthesia problems Neg Hx    Past Surgical History:  Procedure Laterality Date  . WISDOM TOOTH EXTRACTION     Social History   Socioeconomic History  . Marital status: Single    Spouse name: Not on file  . Number of children: Not on file  . Years of education: Not on file  . Highest education level: Not on file  Social Needs  . Financial resource strain: Not on file  . Food insecurity - worry: Not on file  . Food insecurity - inability: Not on file  . Transportation needs - medical: Not on file  . Transportation needs - non-medical: Not on file  Occupational History  . Not on file  Tobacco Use  . Smoking status: Current Every Day Smoker    Packs/day: 0.50    Years: 10.00    Pack years: 5.00    Types: Cigarettes  . Smokeless tobacco: Never Used  Substance and Sexual Activity  . Alcohol use: No    Alcohol/week: 1.2 oz    Types: 2 Glasses of wine per week    Frequency: Never  Comment: 08/04/2017--states no   . Drug use: No  . Sexual activity: Yes    Birth control/protection: None  Other Topics Concern  . Not on file  Social History Narrative  . Not on file     Review of Systems: General: negative for chills, fever, night sweats or weight changes.  Cardiovascular: negative for chest pain, dyspnea on exertion, edema, orthopnea, palpitations, paroxysmal nocturnal dyspnea or shortness of breath Dermatological: negative for rash Respiratory: negative for cough or wheezing Urologic: negative for hematuria Abdominal: negative for nausea, vomiting, diarrhea, bright red blood per rectum, melena, or hematemesis Neurologic: negative for visual changes, syncope, or dizziness All other systems reviewed and are otherwise negative except as noted above.   Physical Exam:  Blood pressure 126/78, pulse 70, height 5\' 7"  (1.702 m), weight 179 lb 6.4 oz (81.4 kg), last menstrual period 05/19/2013, SpO2 97 %.  General appearance: alert, cooperative and no distress Neck: no carotid  bruit and no JVD Lungs: clear to auscultation bilaterally Heart: regular rate and rhythm, S1, S2 normal, no murmur, click, rub or gallop Extremities: extremities normal, atraumatic, no cyanosis or edema Pulses: 2+ and symmetric Skin: Skin color, texture, turgor normal. No rashes or lesions Neurologic: Grossly normal  EKG not performed -- personally reviewed   ASSESSMENT AND PLAN:   1. Systolic HF/ Cardiomyopathy: new diagnosis. Recent echo showed EF of 30-35% with diffuse hypokinesis, in the setting of poorly controlled HTN and CVA. However pt with other cardiac risk factors including tobacco use. As recommend by Dr. Acie Fredrickson, we have waited 6-8 weeks since her stroke. We will plan for [facility] to help classify her cardiomyopathy as ischemic vs nonischemic and r/o CAD. I have reviewed the risks, indications, and alternatives to cardiac catheterization and possible angioplasty/stenting with the patient. Risks include but are not limited to bleeding, infection, vascular injury, stroke, myocardial infection, arrhythmia, kidney injury, radiation-related injury in the case of prolonged fluoroscopy use, emergency cardiac surgery, and death. The patient understands the risks of serious complication is low (<4%). We will obtain precath labs today. Continue medical therapy with ARB and BB. BP is well controlled. Volume is stable. Euvolemic on exam. We will allow time for pt to recover from her URI before performing cath. Will plan week of 10/04/17.  2. CVA: neurological deficits resolved. BP is well controlled. Continue ASA and statin therapy.   3. HTN: controlled on current regimen. She is tolerating higher dose of Coreg w/o issues. Continue Coreg 25 mg BID, Losartan 25 mg and Lasix 20 mg daily. We will repeat BMP today.   4. Tobacco Use: pt has cut down but still smoking. Complete smoking cessation advised.    Follow-Up post [facility].   Talib Headley Ladoris Gene, MHS [facility] 09/23/2017 1:57 PM

## 2017-09-24 LAB — CBC
HEMATOCRIT: 35.2 % (ref 34.0–46.6)
Hemoglobin: 11.7 g/dL (ref 11.1–15.9)
MCH: 28.6 pg (ref 26.6–33.0)
MCHC: 33.2 g/dL (ref 31.5–35.7)
MCV: 86 fL (ref 79–97)
Platelets: 209 10*3/uL (ref 150–379)
RBC: 4.09 x10E6/uL (ref 3.77–5.28)
RDW: 16.1 % — ABNORMAL HIGH (ref 12.3–15.4)
WBC: 6.7 10*3/uL (ref 3.4–10.8)

## 2017-09-24 LAB — BASIC METABOLIC PANEL
BUN/Creatinine Ratio: 12 (ref 9–23)
BUN: 17 mg/dL (ref 6–24)
CHLORIDE: 106 mmol/L (ref 96–106)
CO2: 25 mmol/L (ref 20–29)
CREATININE: 1.44 mg/dL — AB (ref 0.57–1.00)
Calcium: 9 mg/dL (ref 8.7–10.2)
GFR calc Af Amer: 47 mL/min/{1.73_m2} — ABNORMAL LOW (ref >59)
GFR calc non Af Amer: 41 mL/min/{1.73_m2} — ABNORMAL LOW (ref >59)
GLUCOSE: 179 mg/dL — AB (ref 65–99)
Potassium: 4.6 mmol/L (ref 3.5–5.2)
Sodium: 146 mmol/L — ABNORMAL HIGH (ref 134–144)

## 2017-09-24 LAB — PROTIME-INR
INR: 1 (ref 0.8–1.2)
PROTHROMBIN TIME: 10.7 s (ref 9.1–12.0)

## 2017-10-04 ENCOUNTER — Telehealth: Payer: Self-pay | Admitting: *Deleted

## 2017-10-04 ENCOUNTER — Other Ambulatory Visit: Payer: Self-pay | Admitting: Cardiology

## 2017-10-04 ENCOUNTER — Telehealth: Payer: Self-pay | Admitting: Neurology

## 2017-10-04 NOTE — Telephone Encounter (Signed)
Rn call patient about her appt with Dr. Erlinda Hong tomorrow for hospital follow up. PT stated her procedure is at 0800 in the am on 10/05/2017, and she sees the cardiac md at 0100pm the same day. Rn stated Dr. Erlinda Hong was made aware of the conflict of scheduling. PEr Dr. Erlinda Hong she needs to keep her cardiology appt, and procedure appt, and cancel the appt with him. Rn tried to r/s pt appt with Dr. Erlinda Hong but she was not at home. PT stated she will call back to r/s appt with Dr. Erlinda Hong in the next two weeks.

## 2017-10-04 NOTE — Telephone Encounter (Addendum)
Pt contacted pre-catheterization scheduled at [facility] for: Tuesday March 19,2019 10:30 AM Verified arrival time and place: [place] at: 8 AM Nothing to eat drink or midnight prior to cath Verified allergies in Epic.  Hold: Losartan AM of cath Furosemide AM of cath  Except hold medications AM meds can be  taken pre-cath with sip of water including: ASA am of cath  Confirmed patient has responsible person to drive home post procedure and observe patient for 24 hours: pt states she is homeless and is currently sleeping on her cousin's couch--she was told that she could stay overnight in the hospital tomorrow night, this has been relayed to Cath Lab.

## 2017-10-04 NOTE — Telephone Encounter (Signed)
REFILL 

## 2017-10-04 NOTE — Telephone Encounter (Signed)
Pt requesting a call back to discuss a procedure she is having done tomorrow at 8am at [facility]. Stating she is having a catheter placed in her heart. Pt needing to know if Dr. Erlinda Hong wants to see her before procedure.

## 2017-10-05 ENCOUNTER — Ambulatory Visit: Payer: Self-pay | Admitting: Neurology

## 2017-10-05 ENCOUNTER — Inpatient Hospital Stay (HOSPITAL_COMMUNITY)
Admission: RE | Disposition: A | Discharge status: Skilled Nursing Facility | Payer: Self-pay | Source: Ambulatory Visit | Attending: Surgery

## 2017-10-05 ENCOUNTER — Other Ambulatory Visit: Payer: Self-pay | Admitting: *Deleted

## 2017-10-05 ENCOUNTER — Inpatient Hospital Stay (HOSPITAL_COMMUNITY)
Admission: RE | Admit: 2017-10-05 | Discharge: 2017-10-16 | DRG: 234 | Disposition: A | Discharge status: Skilled Nursing Facility | Payer: Medicaid Other | Source: Ambulatory Visit | Attending: Surgery | Admitting: Surgery

## 2017-10-05 DIAGNOSIS — I429 Cardiomyopathy, unspecified: Secondary | ICD-10-CM

## 2017-10-05 DIAGNOSIS — K5900 Constipation, unspecified: Secondary | ICD-10-CM | POA: Diagnosis not present

## 2017-10-05 DIAGNOSIS — I739 Peripheral vascular disease, unspecified: Secondary | ICD-10-CM

## 2017-10-05 DIAGNOSIS — I69354 Hemiplegia and hemiparesis following cerebral infarction affecting left non-dominant side: Secondary | ICD-10-CM

## 2017-10-05 DIAGNOSIS — Z8249 Family history of ischemic heart disease and other diseases of the circulatory system: Secondary | ICD-10-CM

## 2017-10-05 DIAGNOSIS — F17210 Nicotine dependence, cigarettes, uncomplicated: Secondary | ICD-10-CM | POA: Diagnosis present

## 2017-10-05 DIAGNOSIS — I2510 Atherosclerotic heart disease of native coronary artery without angina pectoris: Secondary | ICD-10-CM

## 2017-10-05 DIAGNOSIS — N179 Acute kidney failure, unspecified: Secondary | ICD-10-CM | POA: Diagnosis present

## 2017-10-05 DIAGNOSIS — I779 Disorder of arteries and arterioles, unspecified: Secondary | ICD-10-CM | POA: Diagnosis present

## 2017-10-05 DIAGNOSIS — Z9114 Patient's other noncompliance with medication regimen: Secondary | ICD-10-CM

## 2017-10-05 DIAGNOSIS — D62 Acute posthemorrhagic anemia: Secondary | ICD-10-CM | POA: Diagnosis not present

## 2017-10-05 DIAGNOSIS — Z01818 Encounter for other preprocedural examination: Secondary | ICD-10-CM

## 2017-10-05 DIAGNOSIS — Z79899 Other long term (current) drug therapy: Secondary | ICD-10-CM

## 2017-10-05 DIAGNOSIS — I255 Ischemic cardiomyopathy: Secondary | ICD-10-CM | POA: Diagnosis present

## 2017-10-05 DIAGNOSIS — I130 Hypertensive heart and chronic kidney disease with heart failure and stage 1 through stage 4 chronic kidney disease, or unspecified chronic kidney disease: Secondary | ICD-10-CM | POA: Diagnosis present

## 2017-10-05 DIAGNOSIS — Z951 Presence of aortocoronary bypass graft: Secondary | ICD-10-CM

## 2017-10-05 DIAGNOSIS — I5022 Chronic systolic (congestive) heart failure: Secondary | ICD-10-CM | POA: Diagnosis present

## 2017-10-05 DIAGNOSIS — Z7982 Long term (current) use of aspirin: Secondary | ICD-10-CM

## 2017-10-05 DIAGNOSIS — Z8673 Personal history of transient ischemic attack (TIA), and cerebral infarction without residual deficits: Secondary | ICD-10-CM

## 2017-10-05 DIAGNOSIS — I25118 Atherosclerotic heart disease of native coronary artery with other forms of angina pectoris: Principal | ICD-10-CM

## 2017-10-05 DIAGNOSIS — Z590 Homelessness: Secondary | ICD-10-CM

## 2017-10-05 DIAGNOSIS — K219 Gastro-esophageal reflux disease without esophagitis: Secondary | ICD-10-CM | POA: Diagnosis present

## 2017-10-05 DIAGNOSIS — J9811 Atelectasis: Secondary | ICD-10-CM

## 2017-10-05 DIAGNOSIS — I10 Essential (primary) hypertension: Secondary | ICD-10-CM | POA: Diagnosis present

## 2017-10-05 DIAGNOSIS — F1020 Alcohol dependence, uncomplicated: Secondary | ICD-10-CM | POA: Diagnosis present

## 2017-10-05 DIAGNOSIS — E7800 Pure hypercholesterolemia, unspecified: Secondary | ICD-10-CM | POA: Diagnosis present

## 2017-10-05 DIAGNOSIS — J069 Acute upper respiratory infection, unspecified: Secondary | ICD-10-CM | POA: Diagnosis present

## 2017-10-05 DIAGNOSIS — J45909 Unspecified asthma, uncomplicated: Secondary | ICD-10-CM | POA: Diagnosis present

## 2017-10-05 DIAGNOSIS — N183 Chronic kidney disease, stage 3 (moderate): Secondary | ICD-10-CM | POA: Diagnosis present

## 2017-10-05 HISTORY — PX: RIGHT/LEFT HEART CATH AND CORONARY ANGIOGRAPHY: CATH118266

## 2017-10-05 LAB — CBC
HCT: 32.4 % — ABNORMAL LOW (ref 36.0–46.0)
Hemoglobin: 10.4 g/dL — ABNORMAL LOW (ref 12.0–15.0)
MCH: 28.6 pg (ref 26.0–34.0)
MCHC: 32.1 g/dL (ref 30.0–36.0)
MCV: 89 fL (ref 78.0–100.0)
PLATELETS: 295 10*3/uL (ref 150–400)
RBC: 3.64 MIL/uL — AB (ref 3.87–5.11)
RDW: 14.4 % (ref 11.5–15.5)
WBC: 7.7 10*3/uL (ref 4.0–10.5)

## 2017-10-05 LAB — POCT I-STAT 3, ART BLOOD GAS (G3+)
Acid-base deficit: 2 mmol/L (ref 0.0–2.0)
Acid-base deficit: 4 mmol/L — ABNORMAL HIGH (ref 0.0–2.0)
Bicarbonate: 21.2 mmol/L (ref 20.0–28.0)
Bicarbonate: 23.6 mmol/L (ref 20.0–28.0)
O2 Saturation: 88 %
O2 Saturation: 91 %
PCO2 ART: 44.7 mmHg (ref 32.0–48.0)
PH ART: 7.331 — AB (ref 7.350–7.450)
PH ART: 7.357 (ref 7.350–7.450)
TCO2: 22 mmol/L (ref 22–32)
TCO2: 25 mmol/L (ref 22–32)
pCO2 arterial: 37.8 mmHg (ref 32.0–48.0)
pO2, Arterial: 56 mmHg — ABNORMAL LOW (ref 83.0–108.0)
pO2, Arterial: 67 mmHg — ABNORMAL LOW (ref 83.0–108.0)

## 2017-10-05 LAB — POCT I-STAT 3, VENOUS BLOOD GAS (G3P V)
ACID-BASE DEFICIT: 2 mmol/L (ref 0.0–2.0)
Bicarbonate: 23 mmol/L (ref 20.0–28.0)
O2 SAT: 59 %
PH VEN: 7.352 (ref 7.250–7.430)
PO2 VEN: 32 mmHg (ref 32.0–45.0)
TCO2: 24 mmol/L (ref 22–32)
pCO2, Ven: 41.4 mmHg — ABNORMAL LOW (ref 44.0–60.0)

## 2017-10-05 LAB — CREATININE, SERUM
CREATININE: 1.37 mg/dL — AB (ref 0.44–1.00)
GFR, EST AFRICAN AMERICAN: 50 mL/min — AB (ref >60)
GFR, EST NON AFRICAN AMERICAN: 43 mL/min — AB (ref >60)

## 2017-10-05 LAB — MRSA PCR SCREENING: MRSA BY PCR: NEGATIVE

## 2017-10-05 SURGERY — RIGHT/LEFT HEART CATH AND CORONARY ANGIOGRAPHY
Anesthesia: LOCAL

## 2017-10-05 MED ORDER — SODIUM CHLORIDE 0.9 % WEIGHT BASED INFUSION: 1.0000 mL/kg/h | INTRAVENOUS | Status: DC

## 2017-10-05 MED ORDER — SODIUM CHLORIDE 0.9 % WEIGHT BASED INFUSION
3.0000 mL/kg/h | INTRAVENOUS | Status: DC
  Administered 2017-10-05: 3 mL/kg/h via INTRAVENOUS

## 2017-10-05 MED ORDER — IOPAMIDOL (ISOVUE-370) INJECTION 76%
INTRAVENOUS | Status: DC | PRN
  Administered 2017-10-05: 95 mL via INTRA_ARTERIAL

## 2017-10-05 MED ORDER — ENOXAPARIN SODIUM 40 MG/0.4ML ~~LOC~~ SOLN
40.0000 mg | SUBCUTANEOUS | Status: DC
  Administered 2017-10-06: 40 mg via SUBCUTANEOUS
  Filled 2017-10-05: qty 0.4

## 2017-10-05 MED ORDER — ATORVASTATIN CALCIUM 80 MG PO TABS
80.0000 mg | ORAL_TABLET | Freq: Every day | ORAL | Status: DC
  Administered 2017-10-05 – 2017-10-06 (×2): 80 mg via ORAL
  Filled 2017-10-05 (×2): qty 1

## 2017-10-05 MED ORDER — IOPAMIDOL (ISOVUE-370) INJECTION 76%
INTRAVENOUS | Status: AC
  Filled 2017-10-05: qty 100

## 2017-10-05 MED ORDER — HEPARIN SODIUM (PORCINE) 1000 UNIT/ML IJ SOLN
INTRAMUSCULAR | Status: DC | PRN
  Administered 2017-10-05: 4000 [IU] via INTRAVENOUS

## 2017-10-05 MED ORDER — VERAPAMIL HCL 2.5 MG/ML IV SOLN
INTRAVENOUS | Status: AC
  Filled 2017-10-05: qty 2

## 2017-10-05 MED ORDER — DIPHENHYDRAMINE-APAP (SLEEP) 25-500 MG PO TABS: 2.0000 | ORAL_TABLET | Freq: Every day | ORAL | Status: DC

## 2017-10-05 MED ORDER — SODIUM CHLORIDE 0.9% FLUSH: 3.0000 mL | INTRAVENOUS | Status: DC | PRN

## 2017-10-05 MED ORDER — NITROGLYCERIN 1 MG/10 ML FOR IR/CATH LAB
INTRA_ARTERIAL | Status: AC
  Filled 2017-10-05: qty 10

## 2017-10-05 MED ORDER — MIDAZOLAM HCL 2 MG/2ML IJ SOLN
INTRAMUSCULAR | Status: DC | PRN
Start: 2017-10-05 — End: 2017-10-05
  Administered 2017-10-05 (×2): 1 mg via INTRAVENOUS

## 2017-10-05 MED ORDER — ALBUTEROL SULFATE (2.5 MG/3ML) 0.083% IN NEBU: 3.0000 mL | INHALATION_SOLUTION | Freq: Four times a day (QID) | RESPIRATORY_TRACT | Status: DC | PRN

## 2017-10-05 MED ORDER — HEPARIN (PORCINE) IN NACL 2-0.9 UNIT/ML-% IJ SOLN
INTRAMUSCULAR | Status: DC | PRN
Start: 2017-10-05 — End: 2017-10-05
  Administered 2017-10-05: 10:00:00 via INTRA_ARTERIAL
  Administered 2017-10-05: 10 mL via INTRA_ARTERIAL

## 2017-10-05 MED ORDER — LOSARTAN POTASSIUM 25 MG PO TABS
25.0000 mg | ORAL_TABLET | Freq: Every day | ORAL | Status: DC
  Administered 2017-10-05 – 2017-10-06 (×2): 25 mg via ORAL
  Filled 2017-10-05 (×2): qty 1

## 2017-10-05 MED ORDER — DIPHENHYDRAMINE HCL 25 MG PO CAPS: 50.0000 mg | ORAL_CAPSULE | Freq: Every evening | ORAL | Status: DC | PRN

## 2017-10-05 MED ORDER — HEPARIN (PORCINE) IN NACL 2-0.9 UNIT/ML-% IJ SOLN
INTRAMUSCULAR | Status: AC | PRN
  Administered 2017-10-05: 500 mL

## 2017-10-05 MED ORDER — SODIUM CHLORIDE 0.9 % WEIGHT BASED INFUSION: 1.3000 mL/kg/h | INTRAVENOUS | Status: AC

## 2017-10-05 MED ORDER — SODIUM CHLORIDE 0.9% FLUSH
3.0000 mL | Freq: Two times a day (BID) | INTRAVENOUS | Status: DC
  Administered 2017-10-05 – 2017-10-06 (×3): 3 mL via INTRAVENOUS

## 2017-10-05 MED ORDER — ACETAMINOPHEN 325 MG PO TABS
650.0000 mg | ORAL_TABLET | ORAL | Status: DC | PRN
  Administered 2017-10-05: 650 mg via ORAL
  Filled 2017-10-05: qty 2

## 2017-10-05 MED ORDER — FENTANYL CITRATE (PF) 100 MCG/2ML IJ SOLN
INTRAMUSCULAR | Status: DC | PRN
  Administered 2017-10-05: 50 ug via INTRAVENOUS

## 2017-10-05 MED ORDER — HEPARIN SODIUM (PORCINE) 1000 UNIT/ML IJ SOLN
INTRAMUSCULAR | Status: AC
  Filled 2017-10-05: qty 1

## 2017-10-05 MED ORDER — FUROSEMIDE 20 MG PO TABS
20.0000 mg | ORAL_TABLET | Freq: Every day | ORAL | Status: DC
Start: 2017-10-05 — End: 2017-10-07
  Administered 2017-10-05 – 2017-10-06 (×2): 20 mg via ORAL
  Filled 2017-10-05 (×2): qty 1

## 2017-10-05 MED ORDER — ASPIRIN EC 325 MG PO TBEC
325.0000 mg | DELAYED_RELEASE_TABLET | Freq: Every day | ORAL | Status: DC
  Administered 2017-10-06: 325 mg via ORAL
  Filled 2017-10-05: qty 1

## 2017-10-05 MED ORDER — BUPROPION HCL ER (SR) 150 MG PO TB12
150.0000 mg | ORAL_TABLET | Freq: Two times a day (BID) | ORAL | Status: DC
  Administered 2017-10-05 – 2017-10-06 (×4): 150 mg via ORAL
  Filled 2017-10-05 (×4): qty 1

## 2017-10-05 MED ORDER — CARVEDILOL 25 MG PO TABS
25.0000 mg | ORAL_TABLET | Freq: Two times a day (BID) | ORAL | Status: DC
  Administered 2017-10-05 – 2017-10-07 (×4): 25 mg via ORAL
  Filled 2017-10-05 (×4): qty 1

## 2017-10-05 MED ORDER — SODIUM CHLORIDE 0.9 % IV SOLN: 250.0000 mL | INTRAVENOUS | Status: DC | PRN

## 2017-10-05 MED ORDER — LIDOCAINE HCL 1 % IJ SOLN
INTRAMUSCULAR | Status: AC
  Filled 2017-10-05: qty 20

## 2017-10-05 MED ORDER — ONDANSETRON HCL 4 MG/2ML IJ SOLN: 4.0000 mg | Freq: Four times a day (QID) | INTRAMUSCULAR | Status: DC | PRN

## 2017-10-05 MED ORDER — LIDOCAINE HCL (PF) 1 % IJ SOLN
INTRAMUSCULAR | Status: DC | PRN
  Administered 2017-10-05 (×2): 2 mL

## 2017-10-05 MED ORDER — HEPARIN (PORCINE) IN NACL 2-0.9 UNIT/ML-% IJ SOLN
INTRAMUSCULAR | Status: AC
  Filled 2017-10-05: qty 1000

## 2017-10-05 MED ORDER — OXYCODONE HCL 5 MG PO TABS
5.0000 mg | ORAL_TABLET | ORAL | Status: DC | PRN
  Filled 2017-10-05: qty 2

## 2017-10-05 MED ORDER — ASPIRIN 81 MG PO CHEW: 81.0000 mg | CHEWABLE_TABLET | ORAL | Status: DC

## 2017-10-05 MED ORDER — FENTANYL CITRATE (PF) 100 MCG/2ML IJ SOLN
INTRAMUSCULAR | Status: AC
  Filled 2017-10-05: qty 2

## 2017-10-05 MED ORDER — MIDAZOLAM HCL 2 MG/2ML IJ SOLN
INTRAMUSCULAR | Status: AC
  Filled 2017-10-05: qty 2

## 2017-10-05 MED ORDER — SODIUM CHLORIDE 0.9% FLUSH: 3.0000 mL | Freq: Two times a day (BID) | INTRAVENOUS | Status: DC

## 2017-10-05 MED ORDER — TRAZODONE HCL 50 MG PO TABS: 25.0000 mg | ORAL_TABLET | Freq: Every evening | ORAL | Status: DC | PRN

## 2017-10-05 MED ORDER — ACETAMINOPHEN 500 MG PO TABS: 500.0000 mg | ORAL_TABLET | Freq: Every evening | ORAL | Status: DC | PRN

## 2017-10-05 MED ORDER — NITROGLYCERIN 1 MG/10 ML FOR IR/CATH LAB
INTRA_ARTERIAL | Status: DC | PRN
  Administered 2017-10-05: 200 ug via INTRACORONARY

## 2017-10-05 SURGICAL SUPPLY — 15 items
BAND CMPR LRG ZPHR (HEMOSTASIS) ×1
BAND ZEPHYR COMPRESS 30 LONG (HEMOSTASIS) ×1 IMPLANT
CATH BALLN WEDGE 5F 110CM (CATHETERS) ×1 IMPLANT
CATH IMPULSE 5F ANG/FL3.5 (CATHETERS) ×1 IMPLANT
COVER PRB 48X5XTLSCP FOLD TPE (BAG) IMPLANT
COVER PROBE 5X48 (BAG) ×2
GLIDESHEATH SLEND A-KIT 6F 22G (SHEATH) ×1 IMPLANT
GUIDEWIRE .025 260CM (WIRE) ×1 IMPLANT
GUIDEWIRE INQWIRE 1.5J.035X260 (WIRE) IMPLANT
INQWIRE 1.5J .035X260CM (WIRE) ×2
KIT HEART LEFT (KITS) ×2 IMPLANT
PACK CARDIAC CATHETERIZATION (CUSTOM PROCEDURE TRAY) ×2 IMPLANT
SHEATH GLIDE SLENDER 4/5FR (SHEATH) ×1 IMPLANT
TRANSDUCER W/STOPCOCK (MISCELLANEOUS) ×2 IMPLANT
TUBING CIL FLEX 10 FLL-RA (TUBING) ×2 IMPLANT

## 2017-10-05 NOTE — Interval H&P Note (Signed)
Cath Lab Visit (complete for each Cath Lab visit)  Clinical Evaluation Leading to the Procedure:   ACS: No.  Non-ACS:    Anginal Classification: CCS III  Anti-ischemic medical therapy: No Therapy  Non-Invasive Test Results: High-risk stress test findings: cardiac mortality >3%/year  Prior CABG: No previous CABG      History and Physical Interval Note:  10/05/2017 9:33 AM  Karen Ochoa  has presented today for surgery, with the diagnosis of cardiomyopathy, heart failure  The various methods of treatment have been discussed with the patient and family. After consideration of risks, benefits and other options for treatment, the patient has consented to  Procedure(s): RIGHT/LEFT HEART CATH AND CORONARY ANGIOGRAPHY (N/A) as a surgical intervention .  The patient's history has been reviewed, patient examined, no change in status, stable for surgery.  I have reviewed the patient's chart and labs.  Questions were answered to the patient's satisfaction.     [name]

## 2017-10-05 NOTE — Progress Notes (Signed)
[facility] BAND REMOVAL  LOCATION:    Right radial   DEFLATED PER PROTOCOL:    Yes.    TIME BAND OFF / DRESSING APPLIED:    1325pm with  Gauze and tegraderm  SITE UPON ARRIVAL:    Level 0  SITE AFTER BAND REMOVAL:    Level 0  CIRCULATION SENSATION AND MOVEMENT:    Within Normal Limits   Yes.    COMMENTS:   Pt able to move all hand , 2+pulses

## 2017-10-05 NOTE — Consult Note (Signed)
[address]       [name] [address]             [phone]      Cardiothoracic Surgery Consultation  Reason for Consult: Severe 3-vessel coronary artery disease with unstable angina Referring Physician: Dr. Janeece [name] is an 52 y.o. female.  HPI:   The patient is a 52 year old African-American female with a history of poorly controlled hypertension, hypercholesterolemia, multiple TIAs in the past and recent stroke on 07/18/2017 associated with severe high blood pressure.  She presented with left upper extremity and left lower extremity numbness and weakness and ruled in for a right pontine stroke.  Blood pressure was very high on admission due to medication noncompliance.  She had an echocardiogram done which showed an ejection fraction of 30-35% with diffuse hypokinesis.  Telemetry shows sinus rhythm with no arrhythmias.  Carotid Dopplers showed no stenosis bilaterally.  She was seen by neurology and started on aspirin and statin.  Blood pressure medications were restarted and adjusted to control blood pressure.  Cardiology was consulted for her systolic congestive heart failure and she was started on Coreg and losartan.  Cardiac catheterization was recommended but was delayed to allow her to recover from her stroke.  She reportedly stopped smoking for a while but then restarted.  When she was seen back by the cardiology PA she was reporting some substernal chest discomfort particularly occurring at night.  There is some associated shortness of breath.  She said she did not have any symptoms during the day when she was up moving around although she has not been very active since her stroke.  She underwent cardiac catheterization today showing severe three-vessel coronary disease with overall normal left ventricular ejection fraction of 55%.  LVEDP was normal with a wedge pressure of 24 and normal pulmonary artery pressures.  She is single and has been  living on her cousins couch since she is essentially homeless.  She has been trying to get disability since her stroke.  She was previously working at the [employer] at a temporary job.  Past Medical History:  Diagnosis Date  . Asthma 2017  . Fibroids   . GERD (gastroesophageal reflux disease)   . Headache    "weekly" (07/20/2017)  . High cholesterol 07/18/2017  . Hx MRSA infection   . Hypertension   . Insomnia due to medical condition   . Stroke ([place]) 07/18/2017  . TIA (transient ischemic attack)    "I've had several"    Past Surgical History:  Procedure Laterality Date  . WISDOM TOOTH EXTRACTION      Family History  Problem Relation Age of Onset  . Hypertension Mother   . Diabetes Sister   . Asthma Brother   . Diabetes Brother   . Anesthesia problems Neg Hx     Social History:  reports that she has been smoking cigarettes.  She has a 5.00 pack-year smoking history. she has never used smokeless tobacco. She reports that she does not drink alcohol or use drugs.  Allergies:  Allergies  Allergen Reactions  . Penicillins Anaphylaxis and Other (See Comments)    Has patient had a PCN reaction causing immediate rash, facial/tongue/throat swelling, SOB or lightheadedness with hypotension: Yes Has patient had a PCN reaction causing severe rash involving mucus membranes or skin necrosis: Yes Has patient had a PCN reaction that required hospitalization Yes Has patient had a PCN reaction occurring  within the last 10 years: No If all of the above answers are "NO", then may proceed with Cephalosporin use.   . Sulfonamide Derivatives Anaphylaxis  . Clonidine Derivatives Other (See Comments)    Patient passed out shortly after ingesting this at a doctor's office on 05/08/16 (was taken in conjunction with a tablet of Labetalol.)  . Flagyl [Metronidazole] Swelling and Other (See Comments)    Skin blisters  . Labetalol Other (See Comments)    Patient passed out  shortly after ingesting this at a doctor's office on 05/08/16 (was taken in conjunction with a tablet of Clonidine.)    Medications:  I have reviewed the patient's current medications. Prior to Admission:  Medications Prior to Admission  Medication Sig Dispense Refill Last Dose  . aspirin 325 MG tablet Take 1 tablet (325 mg total) by mouth daily. 30 tablet 5 10/05/2017 at Unknown time  . buPROPion (WELLBUTRIN SR) 150 MG 12 hr tablet Take 1 tablet (150 mg total) by mouth 2 (two) times daily. 60 tablet 5 10/04/2017 at Unknown time  . carvedilol (COREG) 25 MG tablet TAKE 1 TABLET BY MOUTH 2 (TWO) TIMES DAILY. 60 tablet 9 10/04/2017 at Unknown time  . diphenhydramine-acetaminophen (TYLENOL PM) 25-500 MG TABS tablet Take 2 tablets by mouth at bedtime.   10/04/2017 at Unknown time  . furosemide (LASIX) 20 MG tablet Take 1 tablet (20 mg total) by mouth daily. 30 tablet 11 10/04/2017 at Unknown time  . losartan (COZAAR) 25 MG tablet Take 1 tablet (25 mg total) by mouth daily. 30 tablet 5 10/04/2017 at Unknown time  . traZODone (DESYREL) 50 MG tablet Take 0.5 tablets (25 mg total) by mouth at bedtime as needed for sleep. 30 tablet 1 10/04/2017 at Unknown time  . VENTOLIN HFA 108 (90 Base) MCG/ACT inhaler INHALE 2 PUFFS INTO THE LUNGS EVERY 6 (SIX) HOURS AS NEEDED FOR WHEEZING OR SHORTNESS OF BREATH. 18 g 0 10/05/2017 at Unknown time  . [DISCONTINUED] atorvastatin (LIPITOR) 40 MG tablet Take 1 tablet (40 mg total) by mouth daily at 6 PM. 30 tablet 5 10/04/2017 at Unknown time   Scheduled: . aspirin  81 mg Oral Pre-Cath  . buPROPion  150 mg Oral BID  . furosemide  20 mg Oral Daily  . losartan  25 mg Oral Daily  . sodium chloride flush  3 mL Intravenous Q12H   Continuous: . sodium chloride    . sodium chloride    . sodium chloride 1.3 mL/kg/hr (10/05/17 1328)   ZWC:HENIDP chloride, ondansetron (ZOFRAN) IV, sodium chloride flush Anti-infectives (From admission, onward)   None      No results found for  this or any previous visit (from the past 48 hour(s)).  No results found.  Review of Systems  Constitutional: Positive for malaise/fatigue. Negative for chills, diaphoresis, fever and weight loss.  HENT: Negative.   Eyes: Negative.   Respiratory: Positive for cough and shortness of breath. Negative for hemoptysis and sputum production.   Cardiovascular: Positive for chest pain and orthopnea. Negative for palpitations and leg swelling.  Gastrointestinal: Negative.   Genitourinary: Negative.   Musculoskeletal: Negative.   Skin: Negative.   Neurological: Positive for sensory change and focal weakness.       Numbness in left upper and lower extremity with pins and needles feeling. Weakness in those extremities and uses cane to walk for stability.  Endo/Heme/Allergies: Negative.   Psychiatric/Behavioral: Negative.    Blood pressure (!) 170/89, pulse 64, temperature (!) 97.5 F (36.4 C),  temperature source Oral, resp. rate 19, height 5\' 7"  (1.702 m), weight 76.2 kg (168 lb), last menstrual period 05/19/2013, SpO2 100 %. Physical Exam  Constitutional: She is oriented to person, place, and time. She appears well-developed and well-nourished. No distress.  HENT:  Head: Normocephalic and atraumatic.  Mouth/Throat: Oropharynx is clear and moist.  Eyes: Conjunctivae and EOM are normal. Pupils are equal, round, and reactive to light.  Neck: Normal range of motion. Neck supple. No JVD present. No thyromegaly present.  Cardiovascular: Normal rate, regular rhythm, normal heart sounds and intact distal pulses.  No murmur heard. Respiratory: Effort normal and breath sounds normal. No respiratory distress. She has no wheezes. She has no rales. She exhibits no tenderness.  GI: Soft. Bowel sounds are normal. She exhibits no distension and no mass. There is no tenderness.  Musculoskeletal: Normal range of motion. She exhibits no edema.  Lymphadenopathy:    She has no cervical adenopathy.  Neurological:  She is alert and oriented to person, place, and time. A sensory deficit is present.  4/5 strength in left upper and lower extremity.  Decreased sensation in left upper and lower extremity.     *[facility]*                         [address] [place], [place] [address]                            [phone]  ------------------------------------------------------------------- Transthoracic Echocardiography  Patient:    Simi, Briel MR #:       [id number] Study Date: 07/20/2017 Gender:     F Age:        49 Height:     170.2 cm Weight:     78.6 kg BSA:        1.94 m^2 Pt. Status: Room:       [id number]   ATTENDING    Karen Ochoa [id number]  ADMITTING    Luvenia Heller  REFERRING    Etta Quill  PERFORMING   [facility], Inpatient  SONOGRAPHER  Dance, Tiffany  cc:  ------------------------------------------------------------------- LV EF: 30% -   35%  ------------------------------------------------------------------- Indications:      CVA 12.  ------------------------------------------------------------------- History:   PMH:   Transient ischemic attack.  Risk factors: Hypertension.  ------------------------------------------------------------------- Study Conclusions  - Left ventricle: The cavity size was normal. There was severe   concentric hypertrophy. Systolic function was moderately to   severely reduced. The estimated ejection fraction was in the   range of 30% to 35%. Diffuse hypokinesis. Doppler parameters are   consistent with abnormal left ventricular relaxation (grade 1   diastolic dysfunction). Doppler parameters are consistent with   elevated ventricular end-diastolic filling pressure. - Aortic valve: There was trivial regurgitation. - Aortic root: The aortic root was normal in size. - Mitral valve: There was mild regurgitation directed  posteriorly. - Left atrium: The atrium was mildly dilated. - Right ventricle: The cavity size was normal. Wall thickness was   normal. Systolic function was normal. - Right atrium: The atrium was normal in size. - Tricuspid  valve: There was mild regurgitation. - Pulmonic valve: There was trivial regurgitation. - Pulmonary arteries: Systolic pressure was within the normal   range. - Inferior vena cava: The vessel was normal in size. - Pericardium, extracardiac: There was no pericardial effusion.  Impressions:  - Severe concentric LVH with diffuse hypokinesis and LVEF 30-35%. A   cardiac MRI is recommended to differentiate hypertensive vs   infiltrative cardiomyopathy.  ------------------------------------------------------------------- Study data:  No prior study was available for comparison.  Study status:  Routine.  Procedure:  The patient reported no pain pre or post test. Transthoracic echocardiography. Image quality was adequate.  Study completion:  There were no complications. Transthoracic echocardiography.  M-mode, complete 2D, spectral Doppler, and color Doppler.  Birthdate:  Patient birthdate: 12-12-1965.  Age:  Patient is 52 yr old.  Sex:  Gender: female. BMI: 27.2 kg/m^2.  Blood pressure:     135/107  Patient status: Inpatient.  Study date:  Study date: 07/20/2017. Study time: 11:49 AM.  Location:  Bedside.  -------------------------------------------------------------------  ------------------------------------------------------------------- Left ventricle:  The cavity size was normal. There was severe concentric hypertrophy. Systolic function was moderately to severely reduced. The estimated ejection fraction was in the range of 30% to 35%. Diffuse hypokinesis. Doppler parameters are consistent with abnormal left ventricular relaxation (grade 1 diastolic dysfunction). Doppler parameters are consistent with elevated ventricular end-diastolic filling  pressure.  ------------------------------------------------------------------- Aortic valve:   Trileaflet; mildly thickened leaflets. Mobility was not restricted.  Doppler:  Transvalvular velocity was within the normal range. There was no stenosis. There was trivial regurgitation.  ------------------------------------------------------------------- Aorta:  Aortic root: The aortic root was normal in size.  ------------------------------------------------------------------- Mitral valve:   Mildly thickened leaflets . Mobility was not restricted.  Doppler:  Transvalvular velocity was within the normal range. There was no evidence for stenosis. There was mild regurgitation directed posteriorly.    Peak gradient (D): 5 mm Hg.   ------------------------------------------------------------------- Left atrium:  The atrium was mildly dilated.  ------------------------------------------------------------------- Right ventricle:  The cavity size was normal. Wall thickness was normal. Systolic function was normal.  ------------------------------------------------------------------- Pulmonic valve:    Structurally normal valve.   Cusp separation was normal.  Doppler:  Transvalvular velocity was within the normal range. There was no evidence for stenosis. There was trivial regurgitation.  ------------------------------------------------------------------- Tricuspid valve:   Structurally normal valve.    Doppler: Transvalvular velocity was within the normal range. There was mild regurgitation.  ------------------------------------------------------------------- Pulmonary artery:   The main pulmonary artery was normal-sized. Systolic pressure was within the normal range.  ------------------------------------------------------------------- Right atrium:  The atrium was normal in size.  ------------------------------------------------------------------- Pericardium:  There was no  pericardial effusion.  ------------------------------------------------------------------- Systemic veins: Inferior vena cava: The vessel was normal in size.  ------------------------------------------------------------------- Measurements   Left ventricle                          Value        Reference  LV ID, ED, PLAX chordal         (L)     38.1  mm     43 - 52  LV ID, ES, PLAX chordal                 28.7  mm     23 - 38  LV fx shortening, PLAX chordal  (L)     25    %      >=29  LV PW thickness, ED  12.7  mm     ----------  IVS/LV PW ratio, ED                     1.06         <=1.3    Ventricular septum                      Value        Reference  IVS thickness, ED                       13.5  mm     ----------    LVOT                                    Value        Reference  LVOT ID, S                              20    mm     ----------  LVOT area                               3.14  cm^2   ----------    Aorta                                   Value        Reference  Aortic root ID, ED                      34    mm     ----------  Ascending aorta ID, A-P, S              36    mm     ----------    Left atrium                             Value        Reference  LA ID, A-P, ES                          35    mm     ----------  LA ID/bsa, A-P                          1.8   cm/m^2 <=2.2  LA volume, S                            72.7  ml     ----------  LA volume/bsa, S                        37.4  ml/m^2 ----------  LA volume, ES, 1-p A4C                  58.9  ml     ----------  LA volume/bsa, ES, 1-p A4C              30.3  ml/m^2 ----------  LA volume, ES, 1-p A2C  85.1  ml     ----------  LA volume/bsa, ES, 1-p A2C              43.8  ml/m^2 ----------    Mitral valve                            Value        Reference  Mitral E-wave peak velocity             110   cm/s   ----------  Mitral A-wave peak velocity             103   cm/s    ----------  Mitral deceleration time                194   ms     150 - 230  Mitral peak gradient, D                 5     mm Hg  ----------  Mitral E/A ratio, peak                  1.1          ----------  Mitral regurg VTI, PISA                 191   cm     ----------  Mitral ERO, PISA                        0.07  cm^2   ----------  Mitral regurg volume, PISA              13    ml     ----------    Tricuspid valve                         Value        Reference  Tricuspid regurg peak velocity          235   cm/s   ----------  Tricuspid peak RV-RA gradient           22    mm Hg  ----------    Right atrium                            Value        Reference  RA ID, S-I, ES, A4C             (H)     49.4  mm     34 - 49  RA area, ES, A4C                        16.2  cm^2   8.3 - 19.5  RA volume, ES, A/L                      43.6  ml     ----------  RA volume/bsa, ES, A/L                  22.4  ml/m^2 ----------    Right ventricle                         Value        Reference  RV ID, minor axis,  ED, A4C base         30.9  mm     ----------  RV ID, minor axis, ED, A4C mid          14.8  mm     ----------  RV ID, major axis, ED, A4C              84.3  mm     55 - 91  TAPSE                                   23    mm     ----------    Pulmonic valve                          Value        Reference  Pulmonic regurg velocity, ED            146   cm/s   ----------  Pulmonic regurg gradient, ED            9     mm Hg  ----------  Legend: (L)  and  (H)  mark values outside specified reference range.  ------------------------------------------------------------------- Prepared and Electronically Authenticated by  Ena Dawley, M.D. 2019-01-01T14:18:55   Physicians   Panel Physicians Referring Physician Case Authorizing Physician  Belva Crome, MD (Primary)    Procedures   RIGHT/LEFT HEART CATH AND CORONARY ANGIOGRAPHY  Conclusion    Multivessel coronary artery disease, with  Syntax I score of 29.  Normal left main  Segmental calcified 85% mid LAD stenosis forming a [place] 011 bifurcation lesion with a large first diagonal.  LAD wraps around left ventricular apex and supplies collaterals to a small diffusely diseased PDA.  The circumflex is a codominant system with the right coronary and has severe disease in the first (90%) and second (70%) obtuse marginal branches beyond moderate to severe tortuosity.  The mid circumflex between the first and second obtuse marginals contains diffuse disease with up to 80% stenosis.  Right coronary is totally occluded proximally with distal continuation supplied by collaterals from the circumflex.  Inferior basal wall severe hypokinesis.  Overall normal EF of 55%.  Normal LVEDP.  Normal pulmonary artery pressures.  Pulmonary capillary wedge pressure 24.  LVEDP is normal.  RECOMMENDATIONS:   Relatively young female with significant comorbidity and multivessel coronary disease with intermediate risk for PCI based on Syntax I score.  Further uptitrate statin intensity  Evaluation for multivessel CABG.  Indications   Coronary artery disease of native artery of native heart with stable angina pectoris (HCC) [I25.118 (ICD-10-CM)]  Ischemic cardiomyopathy [I25.5 (ICD-10-CM)]  Procedural Details/Technique   Technical Details The right radial area was sterilely prepped and draped. Intravenous sedation with Versed and fentanyl was administered. 1% Xylocaine was infiltrated to achieve local analgesia. Using real-time vascular ultrasound, a double wall stick with an angiocath was utilized to obtain intra-arterial access. A VUS image was saved for the permanent record.The modified Seldinger technique was used to place a 36F " Slender" sheath in the right radial artery. Weight based heparin was administered. Coronary angiography was done using 5 F catheters. Right coronary angiography was performed with a JR4. Left ventricular hemodymic  recordings and angiography was done using the JR 4 catheter and hand injection. Left coronary angiography was performed with a JL 3.5 cm.  Right heart catheterization was performed  by exchanging a previously placed antecubital IV angio-cath for a 5 French Slender sheath. 1% Xylocaine was used to locally nesthetize the area around the IV site. The IV catheter was wired using an .018 guidewire. The modified Seldinger technique was used to place the 5 [place] sheath. Double glove technique was used to enhance sterility. After sheath insertion, right heart cath was performed using a 5 French balloon tipped catheter and fluoroscopic guidance. Pressures were recorded in each chamber and in the pulmonary capillary wedge position.. The main pulmonary artery O2 saturation was sampled.   Hemostasis was achieved using a pneumatic band.  During this procedure the patient is administered a total of Versed 2 mg and Fentanyl 50 mg to achieve and maintain moderate conscious sedation. The patient's heart rate, blood pressure, and oxygen saturation are monitored continuously during the procedure. The period of conscious sedation is 43 minutes, of which I was present face-to-face 100% of this time.   Estimated blood loss <50 mL.  During this procedure the patient was administered the following to achieve and maintain moderate conscious sedation: Versed 2 mg, Fentanyl 50 mcg, while the patient's heart rate, blood pressure, and oxygen saturation were continuously monitored. The period of conscious sedation was 43 minutes, of which I was present face-to-face 100% of this time.  Coronary Findings   Diagnostic  Dominance: Right  Left Anterior Descending  Prox LAD lesion 85% stenosed  Prox LAD lesion is 85% stenosed.  First Diagonal Branch  Ost 1st Diag lesion 40% stenosed  Ost 1st Diag lesion is 40% stenosed.  Left Circumflex  Prox Cx to Mid Cx lesion 80% stenosed  Prox Cx to Mid Cx lesion is 80% stenosed.  First  Obtuse Marginal Branch  Vessel is large in size.  1st Mrg lesion 95% stenosed  1st Mrg lesion is 95% stenosed.  Second Obtuse Marginal Branch  2nd Mrg lesion 70% stenosed  2nd Mrg lesion is 70% stenosed.  Third Obtuse Marginal Branch  Vessel is small in size.  Right Coronary Artery  Prox RCA lesion 100% stenosed  Prox RCA lesion is 100% stenosed.  Prox RCA to Mid RCA lesion 100% stenosed  Prox RCA to Mid RCA lesion is 100% stenosed.  Dist RCA lesion 100% stenosed  Dist RCA lesion is 100% stenosed.  Right Posterior Descending Artery  Collaterals  RPDA filled by collaterals from Dist LAD.    Ost RPDA to RPDA lesion 100% stenosed  Ost RPDA to RPDA lesion is 100% stenosed.  First Right Posterolateral  Vessel is small in size.  Collaterals  1st RPLB filled by collaterals from Dist Cx.    Second Right Posterolateral  Vessel is small in size.  Intervention   No interventions have been documented.  Right Heart   Right Heart Pressures Hemodynamic findings consistent with pulmonary hypertension. LV EDP is normal.  Wall Motion   Resting       The mid anterior, mid inferior, basilar anterior, apical anterior and apical inferior segments are normal. The basilar inferior segment is hypokinetic.           Left Heart   Left Ventricle The left ventricular size is normal. The left ventricular systolic function is normal. LV end diastolic pressure is normal. The left ventricular ejection fraction is 50-55% by visual estimate.  Coronary Diagrams   Diagnostic Diagram       Implants     No implant documentation for this case.  MERGE Images   Show images for CARDIAC CATHETERIZATION  Link to Procedure Log   Procedure Log    Hemo Data    Most Recent Value  Fick Cardiac Output 4.91 L/min  Fick Cardiac Output Index 2.61 (L/min)/BSA  RA A Wave 8 mmHg  RA V Wave 8 mmHg  RA Mean 6 mmHg  RV Systolic Pressure 34 mmHg  RV Diastolic Pressure 1 mmHg  RV EDP 9 mmHg  PA  Systolic Pressure 33 mmHg  PA Diastolic Pressure 9 mmHg  PA Mean 20 mmHg  PW A Wave 20 mmHg  PW V Wave 24 mmHg  PW Mean 15 mmHg  AO Systolic Pressure 962 mmHg  AO Diastolic Pressure 82 mmHg  AO Mean 952 mmHg  LV Systolic Pressure 841 mmHg  LV Diastolic Pressure 3 mmHg  LV EDP 12 mmHg  Arterial Occlusion Pressure Extended Systolic Pressure 324 mmHg  Arterial Occlusion Pressure Extended Diastolic Pressure 81 mmHg  Arterial Occlusion Pressure Extended Mean Pressure 109 mmHg  Left Ventricular Apex Extended Systolic Pressure 401 mmHg  Left Ventricular Apex Extended Diastolic Pressure 8 mmHg  Left Ventricular Apex Extended EDP Pressure 13 mmHg  QP/QS 1  TPVR Index 7.66 HRUI  TSVR Index 42.88 HRUI  PVR SVR Ratio 0.05  TPVR/TSVR Ratio 0.18    Assessment/Plan:  This 52 year old woman has severe three-vessel coronary disease with fairly normal left ventricular systolic function at this time although her echocardiogram in January 2019 showed an ejection fraction of 30-35% with grade 1 diastolic dysfunction.  There was severe left ventricular concentric hypertrophy most likely related to a long history of uncontrolled blood pressure.  I agree that coronary bypass graft surgery is indicated in this patient for relief of her anginal symptoms and to prevent progressive left ventricular dysfunction.  Her operative risk is increased due to her recent history of stroke, uncontrolled blood pressure, and stage III chronic kidney disease most likely due to uncontrolled blood pressure. I discussed the operative procedure with the patient and family including alternatives, benefits and risks; including but not limited to bleeding, blood transfusion, infection, stroke, myocardial infarction, graft failure, heart block requiring a permanent pacemaker, organ dysfunction, and death.  Karen Ochoa understands and agrees to proceed.  We will schedule surgery for Thursday this week.   I spent 60 minutes performing  this consultation and > 50% of this time was spent face to face counseling and coordinating the care of this patient's severe multi-vessel coronary artery disease.  Gaye Pollack 10/05/2017, 3:23 PM

## 2017-10-06 ENCOUNTER — Inpatient Hospital Stay (HOSPITAL_COMMUNITY): Payer: Medicaid Other

## 2017-10-06 ENCOUNTER — Ambulatory Visit (HOSPITAL_COMMUNITY): Payer: Medicaid Other

## 2017-10-06 ENCOUNTER — Encounter (HOSPITAL_COMMUNITY): Payer: Self-pay | Admitting: Interventional Cardiology

## 2017-10-06 ENCOUNTER — Other Ambulatory Visit: Payer: Self-pay

## 2017-10-06 DIAGNOSIS — I200 Unstable angina: Secondary | ICD-10-CM

## 2017-10-06 DIAGNOSIS — I69354 Hemiplegia and hemiparesis following cerebral infarction affecting left non-dominant side: Secondary | ICD-10-CM | POA: Diagnosis not present

## 2017-10-06 DIAGNOSIS — N179 Acute kidney failure, unspecified: Secondary | ICD-10-CM | POA: Diagnosis present

## 2017-10-06 DIAGNOSIS — Z590 Homelessness: Secondary | ICD-10-CM | POA: Diagnosis not present

## 2017-10-06 DIAGNOSIS — Z8249 Family history of ischemic heart disease and other diseases of the circulatory system: Secondary | ICD-10-CM | POA: Diagnosis not present

## 2017-10-06 DIAGNOSIS — I10 Essential (primary) hypertension: Secondary | ICD-10-CM

## 2017-10-06 DIAGNOSIS — Z8673 Personal history of transient ischemic attack (TIA), and cerebral infarction without residual deficits: Secondary | ICD-10-CM

## 2017-10-06 DIAGNOSIS — D62 Acute posthemorrhagic anemia: Secondary | ICD-10-CM | POA: Diagnosis not present

## 2017-10-06 DIAGNOSIS — I2510 Atherosclerotic heart disease of native coronary artery without angina pectoris: Secondary | ICD-10-CM | POA: Diagnosis present

## 2017-10-06 DIAGNOSIS — E7800 Pure hypercholesterolemia, unspecified: Secondary | ICD-10-CM | POA: Diagnosis present

## 2017-10-06 DIAGNOSIS — I130 Hypertensive heart and chronic kidney disease with heart failure and stage 1 through stage 4 chronic kidney disease, or unspecified chronic kidney disease: Secondary | ICD-10-CM | POA: Diagnosis present

## 2017-10-06 DIAGNOSIS — I5022 Chronic systolic (congestive) heart failure: Secondary | ICD-10-CM | POA: Diagnosis present

## 2017-10-06 DIAGNOSIS — F17210 Nicotine dependence, cigarettes, uncomplicated: Secondary | ICD-10-CM | POA: Diagnosis present

## 2017-10-06 DIAGNOSIS — K219 Gastro-esophageal reflux disease without esophagitis: Secondary | ICD-10-CM | POA: Diagnosis present

## 2017-10-06 DIAGNOSIS — I25110 Atherosclerotic heart disease of native coronary artery with unstable angina pectoris: Secondary | ICD-10-CM

## 2017-10-06 DIAGNOSIS — Z9114 Patient's other noncompliance with medication regimen: Secondary | ICD-10-CM | POA: Diagnosis not present

## 2017-10-06 DIAGNOSIS — J45909 Unspecified asthma, uncomplicated: Secondary | ICD-10-CM | POA: Diagnosis present

## 2017-10-06 DIAGNOSIS — I255 Ischemic cardiomyopathy: Secondary | ICD-10-CM | POA: Diagnosis present

## 2017-10-06 DIAGNOSIS — K5900 Constipation, unspecified: Secondary | ICD-10-CM | POA: Diagnosis not present

## 2017-10-06 DIAGNOSIS — F1020 Alcohol dependence, uncomplicated: Secondary | ICD-10-CM | POA: Diagnosis present

## 2017-10-06 DIAGNOSIS — Z01810 Encounter for preprocedural cardiovascular examination: Secondary | ICD-10-CM

## 2017-10-06 DIAGNOSIS — Z79899 Other long term (current) drug therapy: Secondary | ICD-10-CM | POA: Diagnosis not present

## 2017-10-06 DIAGNOSIS — I25118 Atherosclerotic heart disease of native coronary artery with other forms of angina pectoris: Secondary | ICD-10-CM | POA: Diagnosis present

## 2017-10-06 DIAGNOSIS — Z7982 Long term (current) use of aspirin: Secondary | ICD-10-CM | POA: Diagnosis not present

## 2017-10-06 DIAGNOSIS — J069 Acute upper respiratory infection, unspecified: Secondary | ICD-10-CM | POA: Diagnosis present

## 2017-10-06 DIAGNOSIS — N183 Chronic kidney disease, stage 3 (moderate): Secondary | ICD-10-CM | POA: Diagnosis present

## 2017-10-06 LAB — PULMONARY FUNCTION TEST
FEF 25-75 POST: 1.73 L/s
FEF 25-75 PRE: 2.34 L/s
FEF2575-%CHANGE-POST: -25 %
FEF2575-%PRED-PRE: 93 %
FEF2575-%Pred-Post: 68 %
FEV1-%Change-Post: -13 %
FEV1-%PRED-POST: 81 %
FEV1-%Pred-Pre: 93 %
FEV1-Post: 2.03 L
FEV1-Pre: 2.34 L
FEV1FVC-%CHANGE-POST: -12 %
FEV1FVC-%PRED-PRE: 98 %
FEV6-%CHANGE-POST: 0 %
FEV6-%Pred-Post: 95 %
FEV6-%Pred-Pre: 96 %
FEV6-Post: 2.94 L
FEV6-Pre: 2.94 L
FEV6FVC-%Pred-Post: 103 %
FEV6FVC-%Pred-Pre: 103 %
FVC-%Change-Post: 0 %
FVC-%Pred-Post: 93 %
FVC-%Pred-Pre: 93 %
FVC-Post: 2.94 L
FVC-Pre: 2.94 L
POST FEV1/FVC RATIO: 69 %
Post FEV6/FVC ratio: 100 %
Pre FEV1/FVC ratio: 79 %
Pre FEV6/FVC Ratio: 100 %

## 2017-10-06 LAB — URINALYSIS, ROUTINE W REFLEX MICROSCOPIC
Bilirubin Urine: NEGATIVE
Glucose, UA: NEGATIVE mg/dL
KETONES UR: NEGATIVE mg/dL
Leukocytes, UA: NEGATIVE
Nitrite: POSITIVE — AB
Protein, ur: NEGATIVE mg/dL
SPECIFIC GRAVITY, URINE: 1.012 (ref 1.005–1.030)
pH: 5 (ref 5.0–8.0)

## 2017-10-06 LAB — COMPREHENSIVE METABOLIC PANEL
ALK PHOS: 119 U/L (ref 38–126)
ALT: 11 U/L — AB (ref 14–54)
AST: 18 U/L (ref 15–41)
Albumin: 2.9 g/dL — ABNORMAL LOW (ref 3.5–5.0)
Anion gap: 9 (ref 5–15)
BILIRUBIN TOTAL: 0.3 mg/dL (ref 0.3–1.2)
BUN: 15 mg/dL (ref 6–20)
CALCIUM: 8.4 mg/dL — AB (ref 8.9–10.3)
CO2: 22 mmol/L (ref 22–32)
CREATININE: 1.56 mg/dL — AB (ref 0.44–1.00)
Chloride: 108 mmol/L (ref 101–111)
GFR calc Af Amer: 42 mL/min — ABNORMAL LOW (ref >60)
GFR calc non Af Amer: 37 mL/min — ABNORMAL LOW (ref >60)
Glucose, Bld: 108 mg/dL — ABNORMAL HIGH (ref 65–99)
Potassium: 3.5 mmol/L (ref 3.5–5.1)
SODIUM: 139 mmol/L (ref 135–145)
Total Protein: 6.1 g/dL — ABNORMAL LOW (ref 6.5–8.1)

## 2017-10-06 LAB — PROTIME-INR
INR: 1.07
Prothrombin Time: 13.8 seconds (ref 11.4–15.2)

## 2017-10-06 LAB — SURGICAL PCR SCREEN
MRSA, PCR: NEGATIVE
Staphylococcus aureus: NEGATIVE

## 2017-10-06 LAB — HEMOGLOBIN A1C
Hgb A1c MFr Bld: 5.8 % — ABNORMAL HIGH (ref 4.8–5.6)
MEAN PLASMA GLUCOSE: 119.76 mg/dL

## 2017-10-06 LAB — APTT: aPTT: 35 seconds (ref 24–36)

## 2017-10-06 LAB — ABO/RH: ABO/RH(D): O POS

## 2017-10-06 MED ORDER — NITROGLYCERIN IN D5W 200-5 MCG/ML-% IV SOLN
2.0000 ug/min | INTRAVENOUS | Status: AC
  Administered 2017-10-07: 16.6 ug/min via INTRAVENOUS
  Filled 2017-10-06: qty 250

## 2017-10-06 MED ORDER — SODIUM CHLORIDE 0.9 % IV SOLN
INTRAVENOUS | Status: DC
  Filled 2017-10-06: qty 30

## 2017-10-06 MED ORDER — TRANEXAMIC ACID (OHS) PUMP PRIME SOLUTION
2.0000 mg/kg | INTRAVENOUS | Status: DC
  Filled 2017-10-06: qty 1.58

## 2017-10-06 MED ORDER — SODIUM CHLORIDE 0.9 % IV SOLN
30.0000 ug/min | INTRAVENOUS | Status: AC
  Administered 2017-10-07: 20 ug/min via INTRAVENOUS
  Filled 2017-10-06: qty 2

## 2017-10-06 MED ORDER — PLASMA-LYTE 148 IV SOLN
INTRAVENOUS | Status: DC
  Filled 2017-10-06: qty 2.5

## 2017-10-06 MED ORDER — DIAZEPAM 5 MG PO TABS
5.0000 mg | ORAL_TABLET | Freq: Once | ORAL | Status: AC
  Administered 2017-10-07: 5 mg via ORAL
  Filled 2017-10-06: qty 1

## 2017-10-06 MED ORDER — DOPAMINE-DEXTROSE 3.2-5 MG/ML-% IV SOLN
0.0000 ug/kg/min | INTRAVENOUS | Status: DC
  Filled 2017-10-06: qty 250

## 2017-10-06 MED ORDER — NITROGLYCERIN 0.4 MG SL SUBL
SUBLINGUAL_TABLET | SUBLINGUAL | Status: AC
  Administered 2017-10-06: 0.4 mg
  Filled 2017-10-06: qty 1

## 2017-10-06 MED ORDER — TRANEXAMIC ACID (OHS) BOLUS VIA INFUSION
15.0000 mg/kg | INTRAVENOUS | Status: AC
  Administered 2017-10-07: 1185 mg via INTRAVENOUS

## 2017-10-06 MED ORDER — INSULIN REGULAR HUMAN 100 UNIT/ML IJ SOLN
INTRAMUSCULAR | Status: AC
  Administered 2017-10-07: 1 [IU]/h via INTRAVENOUS
  Filled 2017-10-06: qty 1

## 2017-10-06 MED ORDER — VANCOMYCIN HCL 10 G IV SOLR
1250.0000 mg | INTRAVENOUS | Status: AC
  Administered 2017-10-07: 1250 mg via INTRAVENOUS
  Filled 2017-10-06: qty 1250

## 2017-10-06 MED ORDER — MAGNESIUM SULFATE 50 % IJ SOLN
40.0000 meq | INTRAMUSCULAR | Status: DC
  Filled 2017-10-06: qty 9.85

## 2017-10-06 MED ORDER — DEXMEDETOMIDINE HCL IN NACL 400 MCG/100ML IV SOLN
0.1000 ug/kg/h | INTRAVENOUS | Status: AC
Start: 2017-10-07 — End: 2017-10-07
  Administered 2017-10-07: .3 ug/kg/h via INTRAVENOUS
  Filled 2017-10-06: qty 100

## 2017-10-06 MED ORDER — CHLORHEXIDINE GLUCONATE 0.12 % MT SOLN
15.0000 mL | Freq: Once | OROMUCOSAL | Status: AC
  Administered 2017-10-07: 15 mL via OROMUCOSAL
  Filled 2017-10-06: qty 15

## 2017-10-06 MED ORDER — LEVOFLOXACIN IN D5W 500 MG/100ML IV SOLN
500.0000 mg | INTRAVENOUS | Status: AC
  Administered 2017-10-07: 500 mg via INTRAVENOUS
  Filled 2017-10-06: qty 100

## 2017-10-06 MED ORDER — MILRINONE LACTATE IN DEXTROSE 20-5 MG/100ML-% IV SOLN
0.1250 ug/kg/min | INTRAVENOUS | Status: DC
  Filled 2017-10-06: qty 100

## 2017-10-06 MED ORDER — BISACODYL 5 MG PO TBEC
5.0000 mg | DELAYED_RELEASE_TABLET | Freq: Once | ORAL | Status: AC
  Administered 2017-10-06: 5 mg via ORAL
  Filled 2017-10-06: qty 1

## 2017-10-06 MED ORDER — CHLORHEXIDINE GLUCONATE CLOTH 2 % EX PADS
6.0000 | MEDICATED_PAD | Freq: Once | CUTANEOUS | Status: AC
  Administered 2017-10-06: 6 via TOPICAL

## 2017-10-06 MED ORDER — POTASSIUM CHLORIDE 2 MEQ/ML IV SOLN
80.0000 meq | INTRAVENOUS | Status: DC
  Filled 2017-10-06: qty 40

## 2017-10-06 MED ORDER — TEMAZEPAM 15 MG PO CAPS
15.0000 mg | ORAL_CAPSULE | Freq: Once | ORAL | Status: AC | PRN
  Administered 2017-10-06: 15 mg via ORAL
  Filled 2017-10-06: qty 1

## 2017-10-06 MED ORDER — EPINEPHRINE PF 1 MG/ML IJ SOLN
0.0000 ug/min | INTRAMUSCULAR | Status: DC
  Filled 2017-10-06: qty 4

## 2017-10-06 MED ORDER — SODIUM CHLORIDE 0.9 % IV SOLN
1.5000 mg/kg/h | INTRAVENOUS | Status: AC
  Administered 2017-10-07: 1.5 mg/kg/h via INTRAVENOUS
  Filled 2017-10-06: qty 25

## 2017-10-06 MED ORDER — ALBUTEROL SULFATE (2.5 MG/3ML) 0.083% IN NEBU
2.5000 mg | INHALATION_SOLUTION | Freq: Once | RESPIRATORY_TRACT | Status: AC
  Administered 2017-10-06: 2.5 mg via RESPIRATORY_TRACT

## 2017-10-06 MED ORDER — CHLORHEXIDINE GLUCONATE CLOTH 2 % EX PADS: 6.0000 | MEDICATED_PAD | Freq: Once | CUTANEOUS | Status: AC

## 2017-10-06 MED FILL — Lidocaine HCl Local Inj 1%: INTRAMUSCULAR | Qty: 20 | Status: AC

## 2017-10-06 NOTE — Progress Notes (Signed)
Discussed sternal precautions, IS (none available to give her, RN to order), mobility, and d/c planning. Voiced understanding and she has OHS booklet, care guide, and Move in the Tube sheet to read. She is currently living with her elderly cousin who will not be able to help her at d/c. She will probably need SNF at d/c. She will also need PT post op due to recent CVA and left sided weakness. She is using a cane PTA. Will not ambulate now as pt has been having CP.  [phone] Yves Dill CES, ACSM 11:11 AM 10/06/2017

## 2017-10-06 NOTE — Progress Notes (Addendum)
Received consult for Disability  Patient is self pay; Financial Counselor called to see pt to determine what she might qualify for. Mindi Slicker [facility] [phone]

## 2017-10-06 NOTE — Progress Notes (Signed)
Pre-op Cardiac Surgery  Carotid Findings:  Findings suggest 1-39% internal carotid artery stenosis bilaterally. Vertebral arteries are patent with antegrade flow.  Upper Extremity Right Left  Brachial Pressures 141-Triphasic Triphasic- unable to obtain pressure due to IV location.  Radial Waveforms Triphasic Triphasic  Ulnar Waveforms Triphasic Triphasic  Palmar Arch (Allen's Test) Signal decreases 50% with radial compression, is unaffected with ulnar compression. Within normal limits.   Lower  Extremity Right Left  Dorsalis Pedis Triphasic Triphasic  Posterior Tibial Triphasic Triphasic    Findings:   Bilateral pedal waveforms within normal limits at rest.  10/06/2017 10:09 AM Maudry Mayhew, BS, RVT, RDCS, RDMS

## 2017-10-06 NOTE — Progress Notes (Signed)
Progress Note  Patient Name: Karen Ochoa Date of Encounter: 10/06/2017  Primary Cardiologist: Liam Rogers  Subjective   Patient reports chest pain at 2 am relieved with sl Ntg. Now pain free.   Inpatient Medications    Scheduled Meds: . aspirin EC  325 mg Oral Daily  . atorvastatin  80 mg Oral q1800  . buPROPion  150 mg Oral BID  . carvedilol  25 mg Oral BID WC  . enoxaparin (LOVENOX) injection  40 mg Subcutaneous Q24H  . furosemide  20 mg Oral Daily  . losartan  25 mg Oral Daily  . sodium chloride flush  3 mL Intravenous Q12H   Continuous Infusions: . sodium chloride     PRN Meds: sodium chloride, acetaminophen, albuterol, ondansetron (ZOFRAN) IV, oxyCODONE, sodium chloride flush, traZODone   Vital Signs    Vitals:   10/06/17 0112 10/06/17 0122 10/06/17 0127 10/06/17 0617  BP: (!) 146/93 (!) 149/93 (!) 146/69 (!) 166/78  Pulse: 66 66 67 63  Resp: 16   14  Temp: 98.4 F (36.9 C)   98.4 F (36.9 C)  TempSrc: Oral   Oral  SpO2: 100% 100%  99%  Weight:    174 lb 1.6 oz (79 kg)  Height:        Intake/Output Summary (Last 24 hours) at 10/06/2017 0836 Last data filed at 10/06/2017 0420 Gross per 24 hour  Intake 611.05 ml  Output 700 ml  Net -88.95 ml   Filed Weights   10/05/17 0849 10/05/17 1623 10/06/17 0617  Weight: 168 lb (76.2 kg) 175 lb (79.4 kg) 174 lb 1.6 oz (79 kg)    Telemetry    NSR- Personally Reviewed  ECG    NSR with LVH - Personally Reviewed  Physical Exam   GEN: No acute distress.   Neck: No JVD Cardiac: RRR, no murmurs, rubs, or gallops.  Respiratory: Clear to auscultation bilaterally. GI: Soft, nontender, non-distended  MS: No edema; No deformity. Neuro:  Nonfocal  Psych: Normal affect   Labs    Chemistry Recent Labs  Lab 10/05/17 1818  CREATININE 1.37*  GFRNONAA 43*  GFRAA 50*     Hematology Recent Labs  Lab 10/05/17 1818  WBC 7.7  RBC 3.64*  HGB 10.4*  HCT 32.4*  MCV 89.0  MCH 28.6  MCHC 32.1  RDW 14.4    PLT 295    Cardiac EnzymesNo results for input(s): TROPONINI in the last 168 hours. No results for input(s): TROPIPOC in the last 168 hours.   BNPNo results for input(s): BNP, PROBNP in the last 168 hours.   DDimer No results for input(s): DDIMER in the last 168 hours.   Radiology    No results found.  Cardiac Studies   Echo 07/20/17: Study Conclusions  - Left ventricle: The cavity size was normal. There was severe   concentric hypertrophy. Systolic function was moderately to   severely reduced. The estimated ejection fraction was in the   range of 30% to 35%. Diffuse hypokinesis. Doppler parameters are   consistent with abnormal left ventricular relaxation (grade 1   diastolic dysfunction). Doppler parameters are consistent with   elevated ventricular end-diastolic filling pressure. - Aortic valve: There was trivial regurgitation. - Aortic root: The aortic root was normal in size. - Mitral valve: There was mild regurgitation directed posteriorly. - Left atrium: The atrium was mildly dilated. - Right ventricle: The cavity size was normal. Wall thickness was   normal. Systolic function was normal. - Right  atrium: The atrium was normal in size. - Tricuspid valve: There was mild regurgitation. - Pulmonic valve: There was trivial regurgitation. - Pulmonary arteries: Systolic pressure was within the normal   range. - Inferior vena cava: The vessel was normal in size. - Pericardium, extracardiac: There was no pericardial effusion.  Impressions:  - Severe concentric LVH with diffuse hypokinesis and LVEF 30-35%. A   cardiac MRI is recommended to differentiate hypertensive vs   infiltrative cardiomyopathy.  Cardiac cath: Procedures   RIGHT/LEFT HEART CATH AND CORONARY ANGIOGRAPHY  Conclusion    Multivessel coronary artery disease, with Syntax I score of 29.  Normal left main  Segmental calcified 85% mid LAD stenosis forming a [place] 011 bifurcation lesion with a large  first diagonal.  LAD wraps around left ventricular apex and supplies collaterals to a small diffusely diseased PDA.  The circumflex is a codominant system with the right coronary and has severe disease in the first (90%) and second (70%) obtuse marginal branches beyond moderate to severe tortuosity.  The mid circumflex between the first and second obtuse marginals contains diffuse disease with up to 80% stenosis.  Right coronary is totally occluded proximally with distal continuation supplied by collaterals from the circumflex.  Inferior basal wall severe hypokinesis.  Overall normal EF of 55%.  Normal LVEDP.  Normal pulmonary artery pressures.  Pulmonary capillary wedge pressure 24.  LVEDP is normal.  RECOMMENDATIONS:   Relatively young female with significant comorbidity and multivessel coronary disease with intermediate risk for PCI based on Syntax I score.  Further uptitrate statin intensity  Evaluation for multivessel CABG.     Patient Profile     52 y.o. female with history of HTN, TIAs, CKD stage 3, tobacco abuse presents with USAP. S/p hospitalization in January with CVA. Echo at that time showed ED 30-35% with global HK. Cardiac cath shows severe 3 vessel disease with EF 55% with inferior HK.   Assessment & Plan    1. Unstable angina. Cardiac cath demonstrates severe 3 vessel CAD. Best therapeutic option is CABG. Seen by Dr. Cyndia Bent and plan to proceed with CABG tomorrow.  2. Chronic systolic CHF. EF improved according to cath since January. Some of LV dysfunction related to poorly controlled HTN and some from ischemia. On beta blocker, ARB and lasix. Renal function stable yesterday. 3. CKD stage 3 secondary to HTN. Creatinine 1.37 yesterday 4. History of CVA in January. Prior TIAs. On ASA 325 mg 5. HTN. Poorly controlled. Will need further adjustments in medical therapy post op. 6. Tobacco abuse- plan smoking cessation.  For questions or updates, please contact [facility] Please consult [email or web address] for contact info under Cardiology/STEMI.      Signed, Reznor Ferrando [place], MD  10/06/2017, 8:36 AM

## 2017-10-06 NOTE — Clinical Social Work Note (Signed)
Clinical Social Work Assessment  Patient Details  Name: PREEYA CLECKLEY MRN: [id number] Date of Birth: 1966/06/22  Date of referral:  10/06/17               Reason for consult:  Housing Concerns/Homelessness                Permission sought to share information with:    Permission granted to share information::     Name::        Agency::     Relationship::     Contact Information:     Housing/Transportation Living arrangements for the past 2 months:  No permanent address Source of Information:  Patient Patient Interpreter Needed:  None Criminal Activity/Legal Involvement Pertinent to Current Situation/Hospitalization:  No - Comment as needed Significant Relationships:  Other Family Members Lives with:  Relatives(pt stated she lives on cousins couach but sttaed she does not want to return due to stress in the cousins home) Do you feel safe going back to the place where you live?  Yes Need for family participation in patient care:  Yes (Comment)  Care giving concerns:  No family at bedside. Patient stated she lives with her cousin but stated she does not want to go back because being in her cousins home is causing her a lot of stress. Patient stated that her cousin has a lot of dogs as well. CSW gave patient a list of shelters in the area. Patient also received a list of food banks in the area   Facilities manager / plan:  CSW met patient at bedside to offer support and discuss living arrangement. Upon entering patients room, CSW realized that patient had been crying. Patient stated she is just overwhelmed with everything that has happened the past couple of moths. Patient stated that prior to the New Year and prior to her stroke she had her own apartment with her boyfriend of 10 years. Patient stated her and her boyfriend broke up, she lost her apartment because she was unable to work. Patient also expressed in her own way that the fact her family is not supportive during this period  in her life is weighing heavily on her. Patient started crying during assessment with CSW. Patient stated that she will be having a CABG in the morning and stated she might need rehab after surgery. CSW went over the process of an LOG and how rehab placement is not guaranteed. Patient stated she understand. CSW asked if patient would like her to return after the surgery to follow up for support. Patient stated she would like that. CSW to continue to follow    Employment status:  Unemployed Insurance information:  Self Pay (Medicaid Pending) PT Recommendations:  Not assessed at this time Information / Referral to community resources:  Other (Comment Required)(homeless shelters )  Patient/Family's Response to care:  Per patient family does not seem very supportive of patient and her needs. Patient stated she has siblings but they have not come to see her  Patient/Family's Understanding of and Emotional Response to Diagnosis, Current Treatment, and Prognosis: CSW gave patient a list of shelters in the area as well as food bank. CSW to continue to follow after CABG   Emotional Assessment Appearance:  Appears stated age Attitude/Demeanor/Rapport:  Engaged Affect (typically observed):  Accepting Orientation:  Oriented to Place, Oriented to Self, Oriented to  Time, Oriented to Situation, Fluctuating Orientation (Suspected and/or reported Sundowners) Alcohol / Substance use:  Not Applicable Psych involvement (  Current and /or in the community):  No (Comment)  Discharge Needs  Concerns to be addressed:  Homelessness, Lack of Support Readmission within the last 30 days:  No Current discharge risk:  Lack of support system, Homeless Barriers to Discharge:  Homeless with medical needs   Wende Neighbors, LCSW 10/06/2017, 5:32 PM

## 2017-10-06 NOTE — Progress Notes (Signed)
Pt complains of chest tightness of 5/10. Pt denies nausea and vomiting,not in respiratory distress. Nitro SL given x 1, chest tightness was gone after 1 Nitro SL. EKG and VS taken. MD updated (Dr. Raiford Simmonds ). Will continue to monitor pt.

## 2017-10-06 NOTE — Anesthesia Preprocedure Evaluation (Addendum)
Anesthesia Evaluation  Patient identified by MRN, date of birth, ID band Patient awake    Reviewed: Allergy & Precautions, H&P , NPO status , Patient's Chart, lab work & pertinent test results, reviewed documented beta blocker date and time   Airway Mallampati: II  TM Distance: >3 FB Neck ROM: Full    Dental no notable dental hx. (+) Teeth Intact, Dental Advisory Given   Pulmonary asthma , Current Smoker,    Pulmonary exam normal breath sounds clear to auscultation       Cardiovascular Exercise Tolerance: Good hypertension, Pt. on medications and Pt. on home beta blockers + CAD and +CHF   Rhythm:Regular Rate:Normal - Systolic murmurs    Neuro/Psych  Headaches, TIAnegative psych ROS   GI/Hepatic Neg liver ROS, GERD  Medicated,  Endo/Other  negative endocrine ROS  Renal/GU Renal InsufficiencyRenal disease  negative genitourinary   Musculoskeletal   Abdominal   Peds  Hematology negative hematology ROS (+)   Anesthesia Other Findings   Reproductive/Obstetrics negative OB ROS                            Anesthesia Physical Anesthesia Plan  ASA: IV  Anesthesia Plan: General   Post-op Pain Management:    Induction: Intravenous  PONV Risk Score and Plan: 2 and Treatment may vary due to age or medical condition and Midazolam  Airway Management Planned: Oral ETT  Additional Equipment: Arterial line, CVP, PA Cath, TEE and Ultrasound Guidance Line Placement  Intra-op Plan:   Post-operative Plan: Post-operative intubation/ventilation  Informed Consent: I have reviewed the patients History and Physical, chart, labs and discussed the procedure including the risks, benefits and alternatives for the proposed anesthesia with the patient or authorized representative who has indicated his/her understanding and acceptance.   Dental advisory given  Plan Discussed with: CRNA  Anesthesia Plan  Comments:         Anesthesia Quick Evaluation

## 2017-10-07 ENCOUNTER — Inpatient Hospital Stay (HOSPITAL_COMMUNITY): Payer: Medicaid Other

## 2017-10-07 ENCOUNTER — Inpatient Hospital Stay (HOSPITAL_COMMUNITY): Payer: Medicaid Other | Admitting: Anesthesiology

## 2017-10-07 ENCOUNTER — Encounter (HOSPITAL_COMMUNITY)
Admission: RE | Disposition: A | Discharge status: Skilled Nursing Facility | Payer: Self-pay | Source: Ambulatory Visit | Attending: Surgery

## 2017-10-07 ENCOUNTER — Encounter (HOSPITAL_COMMUNITY): Payer: Self-pay | Admitting: Certified Registered Nurse Anesthetist

## 2017-10-07 DIAGNOSIS — I25110 Atherosclerotic heart disease of native coronary artery with unstable angina pectoris: Secondary | ICD-10-CM

## 2017-10-07 DIAGNOSIS — Z951 Presence of aortocoronary bypass graft: Secondary | ICD-10-CM

## 2017-10-07 HISTORY — PX: TEE WITHOUT CARDIOVERSION: SHX5443

## 2017-10-07 HISTORY — PX: CORONARY ARTERY BYPASS GRAFT: SHX141

## 2017-10-07 LAB — POCT I-STAT, CHEM 8
BUN: 15 mg/dL (ref 6–20)
BUN: 11 mg/dL (ref 6–20)
BUN: 13 mg/dL (ref 6–20)
BUN: 12 mg/dL (ref 6–20)
BUN: 12 mg/dL (ref 6–20)
BUN: 14 mg/dL (ref 6–20)
CALCIUM ION: 1.18 mmol/L (ref 1.15–1.40)
CALCIUM ION: 0.95 mmol/L — AB (ref 1.15–1.40)
CALCIUM ION: 1.1 mmol/L — AB (ref 1.15–1.40)
CHLORIDE: 105 mmol/L (ref 101–111)
CHLORIDE: 101 mmol/L (ref 101–111)
CHLORIDE: 106 mmol/L (ref 101–111)
CHLORIDE: 101 mmol/L (ref 101–111)
CHLORIDE: 104 mmol/L (ref 101–111)
CREATININE: 1.1 mg/dL — AB (ref 0.44–1.00)
Calcium, Ion: 0.9 mmol/L — ABNORMAL LOW (ref 1.15–1.40)
Calcium, Ion: 1.2 mmol/L (ref 1.15–1.40)
Calcium, Ion: 1.01 mmol/L — ABNORMAL LOW (ref 1.15–1.40)
Chloride: 101 mmol/L (ref 101–111)
Creatinine, Ser: 1.1 mg/dL — ABNORMAL HIGH (ref 0.44–1.00)
Creatinine, Ser: 0.8 mg/dL (ref 0.44–1.00)
Creatinine, Ser: 1 mg/dL (ref 0.44–1.00)
Creatinine, Ser: 1 mg/dL (ref 0.44–1.00)
Creatinine, Ser: 1.3 mg/dL — ABNORMAL HIGH (ref 0.44–1.00)
Glucose, Bld: 108 mg/dL — ABNORMAL HIGH (ref 65–99)
Glucose, Bld: 81 mg/dL (ref 65–99)
Glucose, Bld: 94 mg/dL (ref 65–99)
Glucose, Bld: 121 mg/dL — ABNORMAL HIGH (ref 65–99)
Glucose, Bld: 105 mg/dL — ABNORMAL HIGH (ref 65–99)
Glucose, Bld: 161 mg/dL — ABNORMAL HIGH (ref 65–99)
HCT: 25 % — ABNORMAL LOW (ref 36.0–46.0)
HCT: 22 % — ABNORMAL LOW (ref 36.0–46.0)
HCT: 24 % — ABNORMAL LOW (ref 36.0–46.0)
HEMATOCRIT: 28 % — AB (ref 36.0–46.0)
HEMATOCRIT: 20 % — AB (ref 36.0–46.0)
HEMATOCRIT: 23 % — AB (ref 36.0–46.0)
HEMOGLOBIN: 8.2 g/dL — AB (ref 12.0–15.0)
Hemoglobin: 9.5 g/dL — ABNORMAL LOW (ref 12.0–15.0)
Hemoglobin: 6.8 g/dL — CL (ref 12.0–15.0)
Hemoglobin: 8.5 g/dL — ABNORMAL LOW (ref 12.0–15.0)
Hemoglobin: 7.5 g/dL — ABNORMAL LOW (ref 12.0–15.0)
Hemoglobin: 7.8 g/dL — ABNORMAL LOW (ref 12.0–15.0)
POTASSIUM: 3.6 mmol/L (ref 3.5–5.1)
Potassium: 3.7 mmol/L (ref 3.5–5.1)
Potassium: 3.9 mmol/L (ref 3.5–5.1)
Potassium: 4.4 mmol/L (ref 3.5–5.1)
Potassium: 4.1 mmol/L (ref 3.5–5.1)
Potassium: 5.5 mmol/L — ABNORMAL HIGH (ref 3.5–5.1)
SODIUM: 142 mmol/L (ref 135–145)
SODIUM: 142 mmol/L (ref 135–145)
SODIUM: 143 mmol/L (ref 135–145)
SODIUM: 140 mmol/L (ref 135–145)
Sodium: 136 mmol/L (ref 135–145)
Sodium: 138 mmol/L (ref 135–145)
TCO2: 25 mmol/L (ref 22–32)
TCO2: 24 mmol/L (ref 22–32)
TCO2: 27 mmol/L (ref 22–32)
TCO2: 27 mmol/L (ref 22–32)
TCO2: 26 mmol/L (ref 22–32)
TCO2: 24 mmol/L (ref 22–32)

## 2017-10-07 LAB — CBC
HCT: 34.5 % — ABNORMAL LOW (ref 36.0–46.0)
HEMATOCRIT: 29.1 % — AB (ref 36.0–46.0)
HEMATOCRIT: 27.6 % — AB (ref 36.0–46.0)
HEMOGLOBIN: 9.1 g/dL — AB (ref 12.0–15.0)
Hemoglobin: 10.9 g/dL — ABNORMAL LOW (ref 12.0–15.0)
Hemoglobin: 9.7 g/dL — ABNORMAL LOW (ref 12.0–15.0)
MCH: 28.3 pg (ref 26.0–34.0)
MCH: 29.5 pg (ref 26.0–34.0)
MCH: 29.3 pg (ref 26.0–34.0)
MCHC: 31.6 g/dL (ref 30.0–36.0)
MCHC: 33.3 g/dL (ref 30.0–36.0)
MCHC: 33 g/dL (ref 30.0–36.0)
MCV: 89.6 fL (ref 78.0–100.0)
MCV: 88.4 fL (ref 78.0–100.0)
MCV: 88.7 fL (ref 78.0–100.0)
PLATELETS: 281 10*3/uL (ref 150–400)
Platelets: 153 10*3/uL (ref 150–400)
Platelets: 188 10*3/uL (ref 150–400)
RBC: 3.85 MIL/uL — ABNORMAL LOW (ref 3.87–5.11)
RBC: 3.29 MIL/uL — ABNORMAL LOW (ref 3.87–5.11)
RBC: 3.11 MIL/uL — AB (ref 3.87–5.11)
RDW: 14.5 % (ref 11.5–15.5)
RDW: 14 % (ref 11.5–15.5)
RDW: 14.2 % (ref 11.5–15.5)
WBC: 6.4 10*3/uL (ref 4.0–10.5)
WBC: 14.7 10*3/uL — AB (ref 4.0–10.5)
WBC: 20.1 10*3/uL — ABNORMAL HIGH (ref 4.0–10.5)

## 2017-10-07 LAB — POCT I-STAT 3, ART BLOOD GAS (G3+)
Acid-Base Excess: 2 mmol/L (ref 0.0–2.0)
Acid-Base Excess: 1 mmol/L (ref 0.0–2.0)
Acid-base deficit: 1 mmol/L (ref 0.0–2.0)
Acid-base deficit: 2 mmol/L (ref 0.0–2.0)
Acid-base deficit: 4 mmol/L — ABNORMAL HIGH (ref 0.0–2.0)
BICARBONATE: 25.7 mmol/L (ref 20.0–28.0)
BICARBONATE: 25 mmol/L (ref 20.0–28.0)
Bicarbonate: 23.7 mmol/L (ref 20.0–28.0)
Bicarbonate: 23.6 mmol/L (ref 20.0–28.0)
Bicarbonate: 22.6 mmol/L (ref 20.0–28.0)
Bicarbonate: 21 mmol/L (ref 20.0–28.0)
O2 SAT: 100 %
O2 SAT: 100 %
O2 SAT: 97 %
O2 Saturation: 100 %
O2 Saturation: 96 %
O2 Saturation: 99 %
PCO2 ART: 38.9 mmHg (ref 32.0–48.0)
PCO2 ART: 32.2 mmHg (ref 32.0–48.0)
PCO2 ART: 36.7 mmHg (ref 32.0–48.0)
PCO2 ART: 37.1 mmHg (ref 32.0–48.0)
PH ART: 7.393 (ref 7.350–7.450)
PH ART: 7.436 (ref 7.350–7.450)
PO2 ART: 235 mmHg — AB (ref 83.0–108.0)
PO2 ART: 423 mmHg — AB (ref 83.0–108.0)
PO2 ART: 279 mmHg — AB (ref 83.0–108.0)
Patient temperature: 98.2
Patient temperature: 37
TCO2: 25 mmol/L (ref 22–32)
TCO2: 27 mmol/L (ref 22–32)
TCO2: 25 mmol/L (ref 22–32)
TCO2: 24 mmol/L (ref 22–32)
TCO2: 22 mmol/L (ref 22–32)
TCO2: 26 mmol/L (ref 22–32)
pCO2 arterial: 31.7 mmHg — ABNORMAL LOW (ref 32.0–48.0)
pCO2 arterial: 36.6 mmHg (ref 32.0–48.0)
pH, Arterial: 7.509 — ABNORMAL HIGH (ref 7.350–7.450)
pH, Arterial: 7.479 — ABNORMAL HIGH (ref 7.350–7.450)
pH, Arterial: 7.396 (ref 7.350–7.450)
pH, Arterial: 7.366 (ref 7.350–7.450)
pO2, Arterial: 82 mmHg — ABNORMAL LOW (ref 83.0–108.0)
pO2, Arterial: 114 mmHg — ABNORMAL HIGH (ref 83.0–108.0)
pO2, Arterial: 91 mmHg (ref 83.0–108.0)

## 2017-10-07 LAB — PROTIME-INR
INR: 1.51
Prothrombin Time: 18.1 seconds — ABNORMAL HIGH (ref 11.4–15.2)

## 2017-10-07 LAB — BASIC METABOLIC PANEL
Anion gap: 8 (ref 5–15)
BUN: 16 mg/dL (ref 6–20)
CALCIUM: 8.5 mg/dL — AB (ref 8.9–10.3)
CO2: 24 mmol/L (ref 22–32)
CREATININE: 1.35 mg/dL — AB (ref 0.44–1.00)
Chloride: 107 mmol/L (ref 101–111)
GFR calc non Af Amer: 44 mL/min — ABNORMAL LOW (ref >60)
GFR, EST AFRICAN AMERICAN: 51 mL/min — AB (ref >60)
Glucose, Bld: 109 mg/dL — ABNORMAL HIGH (ref 65–99)
Potassium: 3.2 mmol/L — ABNORMAL LOW (ref 3.5–5.1)
Sodium: 139 mmol/L (ref 135–145)

## 2017-10-07 LAB — APTT: APTT: 39 s — AB (ref 24–36)

## 2017-10-07 LAB — POCT I-STAT 4, (NA,K, GLUC, HGB,HCT)
GLUCOSE: 102 mg/dL — AB (ref 65–99)
HEMATOCRIT: 28 % — AB (ref 36.0–46.0)
Hemoglobin: 9.5 g/dL — ABNORMAL LOW (ref 12.0–15.0)
Potassium: 4.1 mmol/L (ref 3.5–5.1)
SODIUM: 142 mmol/L (ref 135–145)

## 2017-10-07 LAB — CREATININE, SERUM
CREATININE: 1.31 mg/dL — AB (ref 0.44–1.00)
GFR calc Af Amer: 52 mL/min — ABNORMAL LOW (ref >60)
GFR calc non Af Amer: 45 mL/min — ABNORMAL LOW (ref >60)

## 2017-10-07 LAB — PREPARE RBC (CROSSMATCH)

## 2017-10-07 LAB — GLUCOSE, CAPILLARY
GLUCOSE-CAPILLARY: 110 mg/dL — AB (ref 65–99)
Glucose-Capillary: 114 mg/dL — ABNORMAL HIGH (ref 65–99)

## 2017-10-07 LAB — MAGNESIUM: Magnesium: 3.1 mg/dL — ABNORMAL HIGH (ref 1.7–2.4)

## 2017-10-07 LAB — HEMOGLOBIN AND HEMATOCRIT, BLOOD
HEMATOCRIT: 24.8 % — AB (ref 36.0–46.0)
HEMOGLOBIN: 8.2 g/dL — AB (ref 12.0–15.0)

## 2017-10-07 LAB — PLATELET COUNT: PLATELETS: 169 10*3/uL (ref 150–400)

## 2017-10-07 SURGERY — CORONARY ARTERY BYPASS GRAFTING (CABG)
Anesthesia: General | Site: Chest

## 2017-10-07 MED ORDER — HEPARIN SODIUM (PORCINE) 1000 UNIT/ML IJ SOLN
INTRAMUSCULAR | Status: DC | PRN
  Administered 2017-10-07: 27000 [IU] via INTRAVENOUS

## 2017-10-07 MED ORDER — SODIUM CHLORIDE 0.9 % IV SOLN: INTRAVENOUS | Status: DC

## 2017-10-07 MED ORDER — ACETAMINOPHEN 160 MG/5ML PO SOLN: 1000.0000 mg | Freq: Four times a day (QID) | ORAL | Status: DC

## 2017-10-07 MED ORDER — ARTIFICIAL TEARS OPHTHALMIC OINT
TOPICAL_OINTMENT | OPHTHALMIC | Status: DC | PRN
  Administered 2017-10-07: 1 via OPHTHALMIC

## 2017-10-07 MED ORDER — METOPROLOL TARTRATE 25 MG PO TABS
25.0000 mg | ORAL_TABLET | Freq: Two times a day (BID) | ORAL | Status: DC
  Administered 2017-10-08 – 2017-10-09 (×3): 25 mg via ORAL
  Filled 2017-10-07 (×3): qty 1

## 2017-10-07 MED ORDER — SODIUM CHLORIDE 0.9 % IV SOLN
INTRAVENOUS | Status: DC
  Filled 2017-10-07: qty 1

## 2017-10-07 MED ORDER — INSULIN REGULAR BOLUS VIA INFUSION
0.0000 [IU] | Freq: Three times a day (TID) | INTRAVENOUS | Status: DC
  Filled 2017-10-07: qty 10

## 2017-10-07 MED ORDER — LEVOFLOXACIN IN D5W 750 MG/150ML IV SOLN
750.0000 mg | INTRAVENOUS | Status: AC
  Administered 2017-10-08: 750 mg via INTRAVENOUS
  Filled 2017-10-07: qty 150

## 2017-10-07 MED ORDER — MORPHINE SULFATE (PF) 2 MG/ML IV SOLN: 1.0000 mg | INTRAVENOUS | Status: AC | PRN

## 2017-10-07 MED ORDER — INSULIN ASPART 100 UNIT/ML ~~LOC~~ SOLN
0.0000 [IU] | SUBCUTANEOUS | Status: DC
  Administered 2017-10-07: 2 [IU] via SUBCUTANEOUS
  Administered 2017-10-08: 4 [IU] via SUBCUTANEOUS
  Administered 2017-10-08 (×2): 2 [IU] via SUBCUTANEOUS

## 2017-10-07 MED ORDER — ACETAMINOPHEN 160 MG/5ML PO SOLN: 650.0000 mg | Freq: Once | ORAL | Status: AC

## 2017-10-07 MED ORDER — ARTIFICIAL TEARS OPHTHALMIC OINT
TOPICAL_OINTMENT | OPHTHALMIC | Status: AC
  Filled 2017-10-07: qty 3.5

## 2017-10-07 MED ORDER — TRAMADOL HCL 50 MG PO TABS
50.0000 mg | ORAL_TABLET | ORAL | Status: DC | PRN
  Administered 2017-10-08 – 2017-10-10 (×4): 100 mg via ORAL
  Administered 2017-10-10: 50 mg via ORAL
  Administered 2017-10-11 (×2): 100 mg via ORAL
  Filled 2017-10-07 (×5): qty 2
  Filled 2017-10-07: qty 1
  Filled 2017-10-07 (×2): qty 2

## 2017-10-07 MED ORDER — ASPIRIN EC 325 MG PO TBEC
325.0000 mg | DELAYED_RELEASE_TABLET | Freq: Every day | ORAL | Status: DC
  Administered 2017-10-08 – 2017-10-11 (×4): 325 mg via ORAL
  Filled 2017-10-07 (×4): qty 1

## 2017-10-07 MED ORDER — HEMOSTATIC AGENTS (NO CHARGE) OPTIME
TOPICAL | Status: DC | PRN
  Administered 2017-10-07: 3 via TOPICAL

## 2017-10-07 MED ORDER — HYDRALAZINE HCL 20 MG/ML IJ SOLN
10.0000 mg | INTRAMUSCULAR | Status: DC | PRN
  Administered 2017-10-07 – 2017-10-09 (×3): 10 mg via INTRAVENOUS
  Filled 2017-10-07 (×3): qty 1

## 2017-10-07 MED ORDER — LACTATED RINGERS IV SOLN
INTRAVENOUS | Status: DC | PRN
  Administered 2017-10-07: 07:00:00 via INTRAVENOUS

## 2017-10-07 MED ORDER — MAGNESIUM SULFATE 4 GM/100ML IV SOLN
INTRAVENOUS | Status: AC
  Administered 2017-10-07: 4 g via INTRAVENOUS
  Filled 2017-10-07: qty 100

## 2017-10-07 MED ORDER — SODIUM CHLORIDE 0.45 % IV SOLN
INTRAVENOUS | Status: DC | PRN
  Administered 2017-10-07: 20 mL/h via INTRAVENOUS

## 2017-10-07 MED ORDER — BISACODYL 10 MG RE SUPP: 10.0000 mg | Freq: Every day | RECTAL | Status: DC

## 2017-10-07 MED ORDER — MORPHINE SULFATE (PF) 2 MG/ML IV SOLN
2.0000 mg | INTRAVENOUS | Status: DC | PRN
  Administered 2017-10-07: 4 mg via INTRAVENOUS
  Administered 2017-10-07 – 2017-10-08 (×3): 2 mg via INTRAVENOUS
  Administered 2017-10-08: 4 mg via INTRAVENOUS
  Administered 2017-10-08 – 2017-10-09 (×2): 2 mg via INTRAVENOUS
  Filled 2017-10-07: qty 1
  Filled 2017-10-07: qty 2
  Filled 2017-10-07: qty 1
  Filled 2017-10-07: qty 2
  Filled 2017-10-07 (×3): qty 1

## 2017-10-07 MED ORDER — ASPIRIN 81 MG PO CHEW: 324.0000 mg | CHEWABLE_TABLET | Freq: Every day | ORAL | Status: DC

## 2017-10-07 MED ORDER — SODIUM CHLORIDE 0.9 % IV SOLN
0.0000 ug/kg/h | INTRAVENOUS | Status: DC
  Filled 2017-10-07: qty 2

## 2017-10-07 MED ORDER — 0.9 % SODIUM CHLORIDE (POUR BTL) OPTIME
TOPICAL | Status: DC | PRN
  Administered 2017-10-07: 600 mL

## 2017-10-07 MED ORDER — NITROGLYCERIN IN D5W 200-5 MCG/ML-% IV SOLN
0.0000 ug/min | INTRAVENOUS | Status: DC
  Administered 2017-10-07: 30 ug/min via INTRAVENOUS
  Filled 2017-10-07: qty 250

## 2017-10-07 MED ORDER — MIDAZOLAM HCL 2 MG/2ML IJ SOLN: 2.0000 mg | INTRAMUSCULAR | Status: DC | PRN

## 2017-10-07 MED ORDER — METOPROLOL TARTRATE 25 MG/10 ML ORAL SUSPENSION
12.5000 mg | Freq: Two times a day (BID) | ORAL | Status: DC
  Administered 2017-10-07: 12.5 mg
  Filled 2017-10-07: qty 5

## 2017-10-07 MED ORDER — SODIUM CHLORIDE 0.9 % IV SOLN
INTRAVENOUS | Status: DC | PRN
  Administered 2017-10-07: 13:00:00 via INTRAVENOUS

## 2017-10-07 MED ORDER — SODIUM CHLORIDE 0.9 % IV SOLN
250.0000 mL | INTRAVENOUS | Status: DC
  Administered 2017-10-08: 250 mL via INTRAVENOUS

## 2017-10-07 MED ORDER — THROMBIN 20000 UNITS EX SOLR
CUTANEOUS | Status: AC
  Filled 2017-10-07: qty 20000

## 2017-10-07 MED ORDER — SODIUM CHLORIDE 0.9 % IV SOLN
0.0000 ug/min | INTRAVENOUS | Status: DC
  Filled 2017-10-07: qty 2

## 2017-10-07 MED ORDER — PROTAMINE SULFATE 10 MG/ML IV SOLN
INTRAVENOUS | Status: DC | PRN
  Administered 2017-10-07: 270 mg via INTRAVENOUS

## 2017-10-07 MED ORDER — SODIUM CHLORIDE 0.9% FLUSH
3.0000 mL | Freq: Two times a day (BID) | INTRAVENOUS | Status: DC
  Administered 2017-10-08 – 2017-10-11 (×7): 3 mL via INTRAVENOUS

## 2017-10-07 MED ORDER — FAMOTIDINE IN NACL 20-0.9 MG/50ML-% IV SOLN
20.0000 mg | Freq: Two times a day (BID) | INTRAVENOUS | Status: AC
  Administered 2017-10-07 (×2): 20 mg via INTRAVENOUS
  Filled 2017-10-07: qty 50

## 2017-10-07 MED ORDER — ONDANSETRON HCL 4 MG/2ML IJ SOLN
4.0000 mg | Freq: Four times a day (QID) | INTRAMUSCULAR | Status: DC | PRN
  Administered 2017-10-07 – 2017-10-10 (×3): 4 mg via INTRAVENOUS
  Filled 2017-10-07 (×3): qty 2

## 2017-10-07 MED ORDER — PROPOFOL 10 MG/ML IV BOLUS
INTRAVENOUS | Status: DC | PRN
  Administered 2017-10-07: 60 mg via INTRAVENOUS

## 2017-10-07 MED ORDER — CHLORHEXIDINE GLUCONATE 0.12 % MT SOLN
15.0000 mL | OROMUCOSAL | Status: AC
  Administered 2017-10-07: 15 mL via OROMUCOSAL

## 2017-10-07 MED ORDER — MIDAZOLAM HCL 5 MG/5ML IJ SOLN
INTRAMUSCULAR | Status: DC | PRN
  Administered 2017-10-07: 3 mg via INTRAVENOUS
  Administered 2017-10-07: 2 mg via INTRAVENOUS
  Administered 2017-10-07 (×3): 1 mg via INTRAVENOUS
  Administered 2017-10-07: 2 mg via INTRAVENOUS

## 2017-10-07 MED ORDER — SODIUM CHLORIDE 0.9% FLUSH: 3.0000 mL | INTRAVENOUS | Status: DC | PRN

## 2017-10-07 MED ORDER — VANCOMYCIN HCL IN DEXTROSE 1-5 GM/200ML-% IV SOLN
1000.0000 mg | Freq: Once | INTRAVENOUS | Status: AC
  Administered 2017-10-07: 1000 mg via INTRAVENOUS
  Filled 2017-10-07: qty 200

## 2017-10-07 MED ORDER — METOPROLOL TARTRATE 25 MG/10 ML ORAL SUSPENSION: 25.0000 mg | Freq: Two times a day (BID) | ORAL | Status: DC

## 2017-10-07 MED ORDER — FENTANYL CITRATE (PF) 250 MCG/5ML IJ SOLN
INTRAMUSCULAR | Status: AC
  Filled 2017-10-07: qty 25

## 2017-10-07 MED ORDER — FENTANYL CITRATE (PF) 250 MCG/5ML IJ SOLN
INTRAMUSCULAR | Status: AC
  Filled 2017-10-07: qty 5

## 2017-10-07 MED ORDER — ALBUMIN HUMAN 5 % IV SOLN
INTRAVENOUS | Status: DC | PRN
  Administered 2017-10-07: 14:00:00 via INTRAVENOUS

## 2017-10-07 MED ORDER — PROTAMINE SULFATE 10 MG/ML IV SOLN
INTRAVENOUS | Status: AC
  Filled 2017-10-07: qty 5

## 2017-10-07 MED ORDER — ROCURONIUM BROMIDE 10 MG/ML (PF) SYRINGE
PREFILLED_SYRINGE | INTRAVENOUS | Status: AC
  Filled 2017-10-07: qty 10

## 2017-10-07 MED ORDER — BUPROPION HCL ER (SR) 150 MG PO TB12
150.0000 mg | ORAL_TABLET | Freq: Two times a day (BID) | ORAL | Status: DC
  Administered 2017-10-08 – 2017-10-09 (×3): 150 mg via ORAL
  Filled 2017-10-07 (×3): qty 1

## 2017-10-07 MED ORDER — HEMOSTATIC AGENTS (NO CHARGE) OPTIME
TOPICAL | Status: DC | PRN
  Administered 2017-10-07: 1 via TOPICAL

## 2017-10-07 MED ORDER — PANTOPRAZOLE SODIUM 40 MG PO TBEC
40.0000 mg | DELAYED_RELEASE_TABLET | Freq: Every day | ORAL | Status: DC
  Administered 2017-10-09 – 2017-10-11 (×3): 40 mg via ORAL
  Filled 2017-10-07 (×3): qty 1

## 2017-10-07 MED ORDER — DOCUSATE SODIUM 100 MG PO CAPS
200.0000 mg | ORAL_CAPSULE | Freq: Every day | ORAL | Status: DC
  Administered 2017-10-08 – 2017-10-10 (×3): 200 mg via ORAL
  Filled 2017-10-07 (×3): qty 2

## 2017-10-07 MED ORDER — DEXMEDETOMIDINE HCL IN NACL 200 MCG/50ML IV SOLN
INTRAVENOUS | Status: AC
  Filled 2017-10-07: qty 50

## 2017-10-07 MED ORDER — BISACODYL 5 MG PO TBEC
10.0000 mg | DELAYED_RELEASE_TABLET | Freq: Every day | ORAL | Status: DC
  Administered 2017-10-08 – 2017-10-10 (×3): 10 mg via ORAL
  Filled 2017-10-07 (×3): qty 2

## 2017-10-07 MED ORDER — MIDAZOLAM HCL 10 MG/2ML IJ SOLN
INTRAMUSCULAR | Status: AC
  Filled 2017-10-07: qty 2

## 2017-10-07 MED ORDER — THROMBIN (RECOMBINANT) 20000 UNITS EX SOLR
CUTANEOUS | Status: DC | PRN
  Administered 2017-10-07: 20000 [IU] via TOPICAL

## 2017-10-07 MED ORDER — PLASMA-LYTE 148 IV SOLN
INTRAVENOUS | Status: DC | PRN
  Administered 2017-10-07: 500 mL via INTRAVASCULAR

## 2017-10-07 MED ORDER — ACETAMINOPHEN 500 MG PO TABS
1000.0000 mg | ORAL_TABLET | Freq: Four times a day (QID) | ORAL | Status: DC
  Administered 2017-10-07 – 2017-10-11 (×14): 1000 mg via ORAL
  Filled 2017-10-07 (×14): qty 2

## 2017-10-07 MED ORDER — HEPARIN SODIUM (PORCINE) 1000 UNIT/ML IJ SOLN
INTRAMUSCULAR | Status: AC
  Filled 2017-10-07: qty 1

## 2017-10-07 MED ORDER — LACTATED RINGERS IV SOLN: INTRAVENOUS | Status: DC

## 2017-10-07 MED ORDER — FENTANYL CITRATE (PF) 250 MCG/5ML IJ SOLN
INTRAMUSCULAR | Status: DC | PRN
  Administered 2017-10-07: 250 ug via INTRAVENOUS
  Administered 2017-10-07: 100 ug via INTRAVENOUS
  Administered 2017-10-07 (×2): 50 ug via INTRAVENOUS
  Administered 2017-10-07: 700 ug via INTRAVENOUS
  Administered 2017-10-07: 250 ug via INTRAVENOUS
  Administered 2017-10-07: 100 ug via INTRAVENOUS

## 2017-10-07 MED ORDER — OXYCODONE HCL 5 MG PO TABS
5.0000 mg | ORAL_TABLET | ORAL | Status: DC | PRN
  Administered 2017-10-08: 5 mg via ORAL
  Administered 2017-10-08 (×2): 10 mg via ORAL
  Administered 2017-10-09 – 2017-10-10 (×2): 5 mg via ORAL
  Filled 2017-10-07 (×2): qty 1
  Filled 2017-10-07: qty 2
  Filled 2017-10-07: qty 1
  Filled 2017-10-07: qty 2
  Filled 2017-10-07: qty 1

## 2017-10-07 MED ORDER — ROCURONIUM BROMIDE 10 MG/ML (PF) SYRINGE
PREFILLED_SYRINGE | INTRAVENOUS | Status: DC | PRN
  Administered 2017-10-07: 30 mg via INTRAVENOUS
  Administered 2017-10-07 (×2): 50 mg via INTRAVENOUS
  Administered 2017-10-07: 70 mg via INTRAVENOUS

## 2017-10-07 MED ORDER — METOPROLOL TARTRATE 12.5 MG HALF TABLET: 12.5000 mg | ORAL_TABLET | Freq: Two times a day (BID) | ORAL | Status: DC

## 2017-10-07 MED ORDER — ALBUMIN HUMAN 5 % IV SOLN
250.0000 mL | INTRAVENOUS | Status: DC | PRN
  Administered 2017-10-07 (×2): 250 mL via INTRAVENOUS
  Filled 2017-10-07: qty 250

## 2017-10-07 MED ORDER — PROPOFOL 10 MG/ML IV BOLUS
INTRAVENOUS | Status: AC
  Filled 2017-10-07: qty 20

## 2017-10-07 MED ORDER — ATORVASTATIN CALCIUM 80 MG PO TABS
80.0000 mg | ORAL_TABLET | Freq: Every day | ORAL | Status: DC
  Administered 2017-10-08 – 2017-10-15 (×8): 80 mg via ORAL
  Filled 2017-10-07 (×8): qty 1

## 2017-10-07 MED ORDER — MAGNESIUM SULFATE 4 GM/100ML IV SOLN
4.0000 g | Freq: Once | INTRAVENOUS | Status: AC
  Administered 2017-10-07: 4 g via INTRAVENOUS

## 2017-10-07 MED ORDER — NITROGLYCERIN 0.2 MG/ML ON CALL CATH LAB
INTRAVENOUS | Status: DC | PRN
  Administered 2017-10-07 (×2): 20 ug via INTRAVENOUS
  Administered 2017-10-07: 40 ug via INTRAVENOUS
  Administered 2017-10-07: 60 ug via INTRAVENOUS
  Administered 2017-10-07 (×3): 40 ug via INTRAVENOUS
  Administered 2017-10-07: 20 ug via INTRAVENOUS

## 2017-10-07 MED ORDER — LACTATED RINGERS IV SOLN: 500.0000 mL | Freq: Once | INTRAVENOUS | Status: DC | PRN

## 2017-10-07 MED ORDER — ACETAMINOPHEN 650 MG RE SUPP
650.0000 mg | Freq: Once | RECTAL | Status: AC
  Administered 2017-10-07: 650 mg via RECTAL

## 2017-10-07 MED ORDER — POTASSIUM CHLORIDE 10 MEQ/50ML IV SOLN: 10.0000 meq | INTRAVENOUS | Status: AC

## 2017-10-07 MED ORDER — SODIUM BICARBONATE 8.4 % IV SOLN
50.0000 meq | Freq: Once | INTRAVENOUS | Status: AC
  Administered 2017-10-07: 50 meq via INTRAVENOUS

## 2017-10-07 SURGICAL SUPPLY — 104 items
BAG DECANTER FOR FLEXI CONT (MISCELLANEOUS) ×3 IMPLANT
BANDAGE ACE 4X5 VEL STRL LF (GAUZE/BANDAGES/DRESSINGS) ×4 IMPLANT
BANDAGE ACE 6X5 VEL STRL LF (GAUZE/BANDAGES/DRESSINGS) ×4 IMPLANT
BASKET HEART (ORDER IN 25'S) (MISCELLANEOUS) ×1
BASKET HEART (ORDER IN 25S) (MISCELLANEOUS) ×2 IMPLANT
BLADE STERNUM SYSTEM 6 (BLADE) ×3 IMPLANT
BLADE SURG 11 STRL SS (BLADE) ×1 IMPLANT
BNDG GAUZE ELAST 4 BULKY (GAUZE/BANDAGES/DRESSINGS) ×4 IMPLANT
CANISTER SUCT 3000ML PPV (MISCELLANEOUS) ×3 IMPLANT
CATH ROBINSON RED A/P 18FR (CATHETERS) ×6 IMPLANT
CATH THORACIC 28FR (CATHETERS) ×3 IMPLANT
CATH THORACIC 36FR (CATHETERS) ×3 IMPLANT
CATH THORACIC 36FR RT ANG (CATHETERS) ×3 IMPLANT
CLIP FOGARTY SPRING 6M (CLIP) ×1 IMPLANT
CLIP VESOCCLUDE MED 24/CT (CLIP) IMPLANT
CLIP VESOCCLUDE SM WIDE 24/CT (CLIP) ×1 IMPLANT
COVER SURGICAL LIGHT HANDLE (MISCELLANEOUS) ×1 IMPLANT
CRADLE DONUT ADULT HEAD (MISCELLANEOUS) ×3 IMPLANT
DRAPE CARDIOVASCULAR INCISE (DRAPES) ×3
DRAPE INCISE IOBAN 66X45 STRL (DRAPES) ×1 IMPLANT
DRAPE SLUSH/WARMER DISC (DRAPES) ×3 IMPLANT
DRAPE SRG 135X102X78XABS (DRAPES) ×2 IMPLANT
DRSG AQUACEL AG ADV 3.5X14 (GAUZE/BANDAGES/DRESSINGS) ×1 IMPLANT
DRSG COVADERM 4X14 (GAUZE/BANDAGES/DRESSINGS) ×3 IMPLANT
ELECT CAUTERY BLADE 6.4 (BLADE) ×3 IMPLANT
ELECT REM PT RETURN 9FT ADLT (ELECTROSURGICAL) ×6
ELECTRODE REM PT RTRN 9FT ADLT (ELECTROSURGICAL) ×4 IMPLANT
FELT TEFLON 1X6 (MISCELLANEOUS) ×6 IMPLANT
GAUZE SPONGE 4X4 12PLY STRL (GAUZE/BANDAGES/DRESSINGS) ×6 IMPLANT
GAUZE SPONGE 4X4 12PLY STRL LF (GAUZE/BANDAGES/DRESSINGS) ×3 IMPLANT
GLOVE BIO SURGEON STRL SZ 6 (GLOVE) ×2 IMPLANT
GLOVE BIO SURGEON STRL SZ 6.5 (GLOVE) ×2 IMPLANT
GLOVE BIO SURGEON STRL SZ7 (GLOVE) ×2 IMPLANT
GLOVE BIO SURGEON STRL SZ7.5 (GLOVE) IMPLANT
GLOVE BIOGEL PI IND STRL 6 (GLOVE) IMPLANT
GLOVE BIOGEL PI IND STRL 6.5 (GLOVE) IMPLANT
GLOVE BIOGEL PI IND STRL 7.0 (GLOVE) IMPLANT
GLOVE BIOGEL PI INDICATOR 6 (GLOVE)
GLOVE BIOGEL PI INDICATOR 6.5 (GLOVE)
GLOVE BIOGEL PI INDICATOR 7.0 (GLOVE) ×1
GLOVE EUDERMIC 7 POWDERFREE (GLOVE) ×6 IMPLANT
GLOVE ORTHO TXT STRL SZ7.5 (GLOVE) IMPLANT
GOWN STRL REUS W/ TWL LRG LVL3 (GOWN DISPOSABLE) ×8 IMPLANT
GOWN STRL REUS W/ TWL XL LVL3 (GOWN DISPOSABLE) ×2 IMPLANT
GOWN STRL REUS W/TWL LRG LVL3 (GOWN DISPOSABLE) ×12
GOWN STRL REUS W/TWL XL LVL3 (GOWN DISPOSABLE) ×3
HEMOSTAT POWDER SURGIFOAM 1G (HEMOSTASIS) ×9 IMPLANT
HEMOSTAT SURGICEL 2X14 (HEMOSTASIS) ×3 IMPLANT
INSERT FOGARTY 61MM (MISCELLANEOUS) IMPLANT
INSERT FOGARTY XLG (MISCELLANEOUS) IMPLANT
KIT BASIN OR (CUSTOM PROCEDURE TRAY) ×3 IMPLANT
KIT CATH CPB BARTLE (MISCELLANEOUS) ×3 IMPLANT
KIT SUCTION CATH 14FR (SUCTIONS) ×3 IMPLANT
KIT TURNOVER KIT B (KITS) ×1 IMPLANT
KIT VASOVIEW HEMOPRO VH 3000 (KITS) ×3 IMPLANT
MARKER SKIN DUAL TIP RULER LAB (MISCELLANEOUS) ×1 IMPLANT
NS IRRIG 1000ML POUR BTL (IV SOLUTION) ×15 IMPLANT
PACK E OPEN HEART (SUTURE) ×3 IMPLANT
PACK OPEN HEART (CUSTOM PROCEDURE TRAY) ×3 IMPLANT
PAD ARMBOARD 7.5X6 YLW CONV (MISCELLANEOUS) ×6 IMPLANT
PAD ELECT DEFIB RADIOL ZOLL (MISCELLANEOUS) ×3 IMPLANT
PENCIL BUTTON HOLSTER BLD 10FT (ELECTRODE) ×3 IMPLANT
PUNCH AORTIC ROTATE 4.0MM (MISCELLANEOUS) IMPLANT
PUNCH AORTIC ROTATE 4.5MM 8IN (MISCELLANEOUS) ×3 IMPLANT
PUNCH AORTIC ROTATE 5MM 8IN (MISCELLANEOUS) IMPLANT
SET CARDIOPLEGIA MPS 5001102 (MISCELLANEOUS) ×1 IMPLANT
SOLUTION ANTI FOG 6CC (MISCELLANEOUS) ×1 IMPLANT
SPONGE INTESTINAL PEANUT (DISPOSABLE) IMPLANT
SPONGE LAP 18X18 X RAY DECT (DISPOSABLE) IMPLANT
SPONGE LAP 4X18 X RAY DECT (DISPOSABLE) ×3 IMPLANT
SUT BONE WAX W31G (SUTURE) ×3 IMPLANT
SUT MNCRL AB 4-0 PS2 18 (SUTURE) ×1 IMPLANT
SUT PROLENE 3 0 SH DA (SUTURE) IMPLANT
SUT PROLENE 3 0 SH1 36 (SUTURE) ×3 IMPLANT
SUT PROLENE 4 0 RB 1 (SUTURE)
SUT PROLENE 4 0 SH DA (SUTURE) IMPLANT
SUT PROLENE 4-0 RB1 .5 CRCL 36 (SUTURE) IMPLANT
SUT PROLENE 5 0 C 1 36 (SUTURE) IMPLANT
SUT PROLENE 6 0 C 1 30 (SUTURE) ×4 IMPLANT
SUT PROLENE 7 0 BV 1 (SUTURE) IMPLANT
SUT PROLENE 7 0 BV1 MDA (SUTURE) ×4 IMPLANT
SUT PROLENE 8 0 BV175 6 (SUTURE) ×1 IMPLANT
SUT SILK  1 MH (SUTURE)
SUT SILK 1 MH (SUTURE) IMPLANT
SUT STEEL 6MS V (SUTURE) ×2 IMPLANT
SUT STEEL STERNAL CCS#1 18IN (SUTURE) ×1 IMPLANT
SUT STEEL SZ 6 DBL 3X14 BALL (SUTURE) IMPLANT
SUT VIC AB 1 CTX 36 (SUTURE) ×6
SUT VIC AB 1 CTX36XBRD ANBCTR (SUTURE) ×4 IMPLANT
SUT VIC AB 2-0 CT1 27 (SUTURE) ×3
SUT VIC AB 2-0 CT1 TAPERPNT 27 (SUTURE) IMPLANT
SUT VIC AB 2-0 CTX 27 (SUTURE) IMPLANT
SUT VIC AB 3-0 SH 27 (SUTURE)
SUT VIC AB 3-0 SH 27X BRD (SUTURE) IMPLANT
SUT VIC AB 3-0 X1 27 (SUTURE) IMPLANT
SUT VICRYL 4-0 PS2 18IN ABS (SUTURE) IMPLANT
SYSTEM SAHARA CHEST DRAIN ATS (WOUND CARE) ×3 IMPLANT
TAPE CLOTH SURG 4X10 WHT LF (GAUZE/BANDAGES/DRESSINGS) ×1 IMPLANT
TOWEL GREEN STERILE (TOWEL DISPOSABLE) ×3 IMPLANT
TOWEL GREEN STERILE FF (TOWEL DISPOSABLE) ×3 IMPLANT
TRAY FOLEY SILVER 16FR TEMP (SET/KITS/TRAYS/PACK) ×3 IMPLANT
TUBING INSUFFLATION (TUBING) ×3 IMPLANT
UNDERPAD 30X30 (UNDERPADS AND DIAPERS) ×1 IMPLANT
WATER STERILE IRR 1000ML POUR (IV SOLUTION) ×6 IMPLANT

## 2017-10-07 NOTE — Progress Notes (Signed)
Cardiothoracic Surgery  She has been stable overnight and ready for CABG today. Creat stable at 1.35.

## 2017-10-07 NOTE — Anesthesia Procedure Notes (Signed)
Central Venous Catheter Insertion Performed by: Roderic Palau, MD, anesthesiologist Start/End3/21/2019 6:35 AM, 10/07/2017 6:50 AM Patient location: Pre-op. Preanesthetic checklist: patient identified, IV checked, site marked, risks and benefits discussed, surgical consent, monitors and equipment checked, pre-op evaluation, timeout performed and anesthesia consent Hand hygiene performed  and maximum sterile barriers used  PA cath was placed.Swan type:thermodilution PA Cath depth:50 Procedure performed without using ultrasound guided technique. Attempts: 1 Patient tolerated the procedure well with no immediate complications.

## 2017-10-07 NOTE — Anesthesia Postprocedure Evaluation (Signed)
Anesthesia Post Note  Patient: Karen Ochoa  Procedure(s) Performed: CORONARY ARTERY BYPASS GRAFTING (CABG) ON PUMP TIMES 5 USING LEFT INTERNAL MAMMARY ARTERY AND BILATERAL GREATER SAPHENOUS VEIN VIA ENDOHARDEST. (N/A Chest) TRANSESOPHAGEAL ECHOCARDIOGRAM (TEE) (N/A )     Patient location during evaluation: SICU Anesthesia Type: General Level of consciousness: sedated Pain management: pain level controlled Vital Signs Assessment: post-procedure vital signs reviewed and stable Respiratory status: patient remains intubated per anesthesia plan Cardiovascular status: stable Postop Assessment: no apparent nausea or vomiting Anesthetic complications: no                  Karen Ochoa,W. EDMOND

## 2017-10-07 NOTE — Transfer of Care (Signed)
Immediate Anesthesia Transfer of Care Note  Patient: Karen Ochoa  Procedure(s) Performed: CORONARY ARTERY BYPASS GRAFTING (CABG) ON PUMP TIMES 5 USING LEFT INTERNAL MAMMARY ARTERY AND BILATERAL GREATER SAPHENOUS VEIN VIA ENDOHARDEST. (N/A Chest) TRANSESOPHAGEAL ECHOCARDIOGRAM (TEE) (N/A )  Patient Location: ICU  Anesthesia Type:General  Level of Consciousness: sedated and Patient remains intubated per anesthesia plan  Airway & Oxygen Therapy: Patient remains intubated per anesthesia plan and Patient placed on Ventilator (see vital sign flow sheet for setting)  Post-op Assessment: Report given to RN and Post -op Vital signs reviewed and stable  Post vital signs: Reviewed and stable  Last Vitals:  Vitals Value Taken Time  BP    Temp    Pulse    Resp    SpO2      Last Pain:  Vitals:   10/07/17 0432  TempSrc: Oral  PainSc:       Patients Stated Pain Goal: 4 (99/[id number]/41 [id number])  Complications: No apparent anesthesia complications

## 2017-10-07 NOTE — Anesthesia Procedure Notes (Signed)
Procedure Name: Intubation Date/Time: 10/07/2017 7:41 AM Performed by: Candis Shine, CRNA Pre-anesthesia Checklist: Patient identified, Emergency Drugs available, Suction available and Patient being monitored Patient Re-evaluated:Patient Re-evaluated prior to induction Oxygen Delivery Method: Circle System Utilized Preoxygenation: Pre-oxygenation with 100% oxygen Induction Type: IV induction Ventilation: Mask ventilation without difficulty Laryngoscope Size: Mac and 3 Grade View: Grade I Tube type: Oral Tube size: 7.5 mm Number of attempts: 1 Airway Equipment and Method: Stylet Placement Confirmation: ETT inserted through vocal cords under direct vision,  positive ETCO2 and breath sounds checked- equal and bilateral Secured at: 22 cm Tube secured with: Tape Dental Injury: Teeth and Oropharynx as per pre-operative assessment

## 2017-10-07 NOTE — Progress Notes (Signed)
  Echocardiogram Echocardiogram Transesophageal has been performed.  Karen Ochoa 10/07/2017, 8:28 AM

## 2017-10-07 NOTE — Op Note (Signed)
CARDIOVASCULAR SURGERY OPERATIVE NOTE  10/07/2017  Surgeon:  Gaye Pollack, MD  First Assistant: Nicholes Rough,  PA-C   Preoperative Diagnosis:  Severe multi-vessel coronary artery disease   Postoperative Diagnosis:  Same   Procedure:  1. Median Sternotomy 2. Extracorporeal circulation 3.   Coronary artery bypass grafting x 5   Left internal mammary graft to the LAD  SVG to diagonal  SVG to OM1  SVG to OM2  SVG to RCA 4.   Endoscopic vein harvest from the right and left legs   Anesthesia:  General Endotracheal   Clinical History/Surgical Indication:   The patient is a 52 year old African-American female with a history of poorly controlled hypertension, hypercholesterolemia, multiple TIAs in the past and recent stroke on 07/18/2017 associated with severe high blood pressure.  She presented with left upper extremity and left lower extremity numbness and weakness and ruled in for a right pontine stroke.  Blood pressure was very high on admission due to medication noncompliance.  She had an echocardiogram done which showed an ejection fraction of 30-35% with diffuse hypokinesis.  Telemetry shows sinus rhythm with no arrhythmias.  Carotid Dopplers showed no stenosis bilaterally.  She was seen by neurology and started on aspirin and statin.  Blood pressure medications were restarted and adjusted to control blood pressure.  Cardiology was consulted for her systolic congestive heart failure and she was started on Coreg and losartan.  Cardiac catheterization was recommended but was delayed to allow her to recover from her stroke.  She reportedly stopped smoking for a while but then restarted.  When she was seen back by the cardiology PA she was reporting some substernal chest discomfort particularly occurring at night.  There is some associated shortness of breath.  She said she did not have any  symptoms during the day when she was up moving around although she has not been very active since her stroke.  She underwent cardiac catheterization today showing severe three-vessel coronary disease with overall normal left ventricular ejection fraction of 55%.  LVEDP was normal with a wedge pressure of 24 and normal pulmonary artery pressures. She has severe three-vessel coronary disease with fairly normal left ventricular systolic function at this time although her echocardiogram in January 2019 showed an ejection fraction of 30-35% with grade 1 diastolic dysfunction.  There was severe left ventricular concentric hypertrophy most likely related to a long history of uncontrolled blood pressure.  I agree that coronary bypass graft surgery is indicated in this patient for relief of her anginal symptoms and to prevent progressive left ventricular dysfunction.  Her operative risk is increased due to her recent history of stroke, uncontrolled blood pressure, and stage III chronic kidney disease most likely due to uncontrolled blood pressure. I discussed the operative procedure with the patient and family including alternatives, benefits and risks; including but not limited to bleeding, blood transfusion, infection, stroke, myocardial infarction, graft failure, heart block requiring a permanent pacemaker, organ dysfunction, and death.  Karen Ochoa understands and agrees to proceed.    Preparation:  The patient was seen in the preoperative holding area and the correct patient, correct operation were confirmed with the patient after reviewing the medical record and catheterization. The consent was signed by me. Preoperative antibiotics were given. A pulmonary arterial line and radial arterial line were placed by the anesthesia team. The patient was taken back to the operating room and positioned supine on the operating room table. After being placed under general endotracheal anesthesia by the  anesthesia team a  foley catheter was placed. The neck, chest, abdomen, and both legs were prepped with betadine soap and solution and draped in the usual sterile manner. A surgical time-out was taken and the correct patient and operative procedure were confirmed with the nursing and anesthesia staff.   Cardiopulmonary Bypass:  A median sternotomy was performed. The pericardium was opened in the midline. Right ventricular function appeared normal. The ascending aorta was of normal size and had no palpable plaque. There were no contraindications to aortic cannulation or cross-clamping. The patient was fully systemically heparinized and the ACT was maintained > 400 sec. The proximal aortic arch was cannulated with a 20 F aortic cannula for arterial inflow. Venous cannulation was performed via the right atrial appendage using a two-staged venous cannula. An antegrade cardioplegia/vent cannula was inserted into the mid-ascending aorta. Aortic occlusion was performed with a single cross-clamp. Systemic cooling to 32 degrees Centigrade and topical cooling of the heart with iced saline were used. Hyperkalemic antegrade cold blood cardioplegia was used to induce diastolic arrest and was then given at about 20 minute intervals throughout the period of arrest to maintain myocardial temperature at or below 10 degrees centigrade. A temperature probe was inserted into the interventricular septum and an insulating pad was placed in the pericardium.   Left internal mammary harvest:  The left side of the sternum was retracted using the Rultract retractor. The left internal mammary artery was harvested as a pedicle graft. All side branches were clipped. It was a medium-sized vessel of good quality with excellent blood flow. It was ligated distally and divided. It was sprayed with topical papaverine solution to prevent vasospasm.   Endoscopic vein harvest:  The right greater saphenous vein was harvested endoscopically through a 2 cm  incision medial to the right knee. It was harvested from the upper thigh to below the knee. It was a medium-sized vein of good quality in the thigh but below the knee it branched and became too small. Therefore a second segment of greater saphenous vein was harvested from the left thigh. It was a medium sized vein of good quality. The side branches were all ligated with 4-0 silk ties.    Coronary arteries:  The coronary arteries were examined. They were tortuous from chronic hypertension and diffusely diseased with plaque.   LAD:  Large vessel that was diffusely diseased. Diagonal was large and had no distal plaque.  LCX:  OM1 was large and diffusely diseased with segmental plaque. The OM2 was moderate sized and was lying high on the lateral wall.  RCA:  Medium caliber vessel before the PDA branch which was small.   Grafts:  1. LIMA to the LAD: 2.5 mm. It was sewn end to side using 8-0 prolene continuous suture. 2. SVG to diagonal:  1.75 mm. It was sewn end to side using 7-0 prolene continuous suture. 3. SVG to OM1:  1.75 mm. It was sewn end to side using 7-0 prolene continuous suture. 4. SVG to OM2:  1.75 mm. It was sewn end to side using 7-0 prolene continuous suture. 5. SVG to distal RCA:   1.72mm. It was sewn end to side using 7-0 prolene continuous suture.  The proximal vein graft anastomoses were performed to the mid-ascending aorta using continuous 6-0 prolene suture. Graft markers were placed around the proximal anastomoses.   Completion:  The patient was rewarmed to 37 degrees Centigrade. The clamp was removed from the LIMA pedicle and there was rapid warming of  the septum and return of ventricular fibrillation. The crossclamp was removed with a time of 100 minutes. There was spontaneous return of sinus rhythm. The distal and proximal anastomoses were checked for hemostasis. The position of the grafts was satisfactory. Two temporary epicardial pacing wires were placed on the  right atrium and two on the right ventricle. The patient was weaned from CPB without difficulty on no inotropes. CPB time was 122 minutes. Cardiac output was 5 LPM. Heparin was fully reversed with protamine and the aortic and venous cannulas removed. Hemostasis was achieved. Mediastinal and left pleural drainage tubes were placed. The sternum was closed with double #6 stainless steel wires. The fascia was closed with continuous # 1 vicryl suture. The subcutaneous tissue was closed with 2-0 vicryl continuous suture. The skin was closed with 3-0 vicryl subcuticular suture. All sponge, needle, and instrument counts were reported correct at the end of the case. Dry sterile dressings were placed over the incisions and around the chest tubes which were connected to pleurevac suction. The patient was then transported to the surgical intensive care unit in  stable condition.

## 2017-10-07 NOTE — Brief Op Note (Signed)
10/05/2017 - 10/07/2017  8:04 AM  PATIENT:  Karen Ochoa  52 y.o. female  PRE-OPERATIVE DIAGNOSIS:  CAD  POST-OPERATIVE DIAGNOSIS:  coronary artery disease   PROCEDURE:  Procedure(s): CORONARY ARTERY BYPASS GRAFTING  X 5(CABG) (N/A) TRANSESOPHAGEAL ECHOCARDIOGRAM (TEE) (N/A) LIMA to LAD SVG to OM1 SVG to OM2 SVG to Diag1 SVG to RCA  SURGEON:  Surgeon(s) and Role:    * Bartle, Fernande Boyden, MD - Primary  PHYSICIAN ASSISTANT:  Nicholes Rough, PA-C    ANESTHESIA:   general  EBL:  600 mL   BLOOD ADMINISTERED:none  DRAINS: routine   LOCAL MEDICATIONS USED:  NONE  SPECIMEN:  No Specimen  DISPOSITION OF SPECIMEN:  N/A  COUNTS:  YES  DICTATION: .Dragon Dictation  PLAN OF CARE: Admit to inpatient   PATIENT DISPOSITION:  ICU - intubated and hemodynamically stable.   Delay start of Pharmacological VTE agent (>24hrs) due to surgical blood loss or risk of bleeding: yes

## 2017-10-07 NOTE — Anesthesia Procedure Notes (Signed)
Arterial Line Insertion Start/End3/21/2019 6:45 AM Performed by: Candis Shine, CRNA, CRNA  Patient location: Pre-op. Preanesthetic checklist: patient identified, IV checked, site marked, risks and benefits discussed, surgical consent, monitors and equipment checked, pre-op evaluation, timeout performed and anesthesia consent Lidocaine 1% used for infiltration Left, radial was placed Catheter size: 20 G Hand hygiene performed  and maximum sterile barriers used   Attempts: 1 Procedure performed without using ultrasound guided technique. Following insertion, dressing applied and Biopatch. Post procedure assessment: normal and unchanged  Patient tolerated the procedure well with no immediate complications.

## 2017-10-07 NOTE — Anesthesia Procedure Notes (Signed)
Central Venous Catheter Insertion Performed by: Roderic Palau, MD, anesthesiologist Start/End3/21/2019 6:35 AM, 10/07/2017 6:50 AM Patient location: Pre-op. Preanesthetic checklist: patient identified, IV checked, site marked, risks and benefits discussed, surgical consent, monitors and equipment checked, pre-op evaluation, timeout performed and anesthesia consent Position: Trendelenburg Lidocaine 1% used for infiltration and patient sedated Hand hygiene performed , maximum sterile barriers used  and Seldinger technique used Catheter size: 9 Fr Total catheter length 10. Central line was placed.MAC introducer Procedure performed using ultrasound guided technique. Ultrasound Notes:anatomy identified, needle tip was noted to be adjacent to the nerve/plexus identified, no ultrasound evidence of intravascular and/or intraneural injection and image(s) printed for medical record Attempts: 1 Following insertion, line sutured, dressing applied and Biopatch. Post procedure assessment: blood return through all ports, free fluid flow and no air  Patient tolerated the procedure well with no immediate complications.

## 2017-10-07 NOTE — Progress Notes (Signed)
Patient ID: Karen Ochoa, female   DOB: 09/27/1965, 52 y.o.   MRN: [id number]  TCTS Evening Rounds:   Hemodynamically stable in sinus rhythm 81. Maxed out on NTG. Will start prn hydralazine.   Extubated  Urine output good  CT output low  CBC    Component Value Date/Time   WBC 14.7 (H) 10/07/2017 1410   RBC 3.29 (L) 10/07/2017 1410   HGB 9.5 (L) 10/07/2017 1412   HGB 11.7 09/23/2017 1400   HCT 28.0 (L) 10/07/2017 1412   HCT 35.2 09/23/2017 1400   PLT 153 10/07/2017 1410   PLT 209 09/23/2017 1400   MCV 88.4 10/07/2017 1410   MCV 86 09/23/2017 1400   MCH 29.5 10/07/2017 1410   MCHC 33.3 10/07/2017 1410   RDW 14.0 10/07/2017 1410   RDW 16.1 (H) 09/23/2017 1400   LYMPHSABS 2.3 08/04/2017 1406   MONOABS 0.5 07/18/2017 1850   EOSABS 0.1 08/04/2017 1406   BASOSABS 0.0 08/04/2017 1406     BMET    Component Value Date/Time   NA 142 10/07/2017 1412   NA 146 (H) 09/23/2017 1400   K 4.1 10/07/2017 1412   CL 101 10/07/2017 1306   CO2 24 10/07/2017 0310   GLUCOSE 102 (H) 10/07/2017 1412   BUN 12 10/07/2017 1306   BUN 17 09/23/2017 1400   CREATININE 1.10 (H) 10/07/2017 1306   CALCIUM 8.5 (L) 10/07/2017 0310   GFRNONAA 44 (L) 10/07/2017 0310   GFRAA 51 (L) 10/07/2017 0310     A/P:  Stable postop course. Continue current plans

## 2017-10-08 ENCOUNTER — Encounter (HOSPITAL_COMMUNITY): Payer: Self-pay | Admitting: Surgery

## 2017-10-08 ENCOUNTER — Inpatient Hospital Stay (HOSPITAL_COMMUNITY): Payer: Medicaid Other

## 2017-10-08 LAB — CBC
HEMATOCRIT: 26.1 % — AB (ref 36.0–46.0)
HEMATOCRIT: 27.4 % — AB (ref 36.0–46.0)
HEMOGLOBIN: 8.6 g/dL — AB (ref 12.0–15.0)
Hemoglobin: 8.7 g/dL — ABNORMAL LOW (ref 12.0–15.0)
MCH: 29.3 pg (ref 26.0–34.0)
MCH: 28.8 pg (ref 26.0–34.0)
MCHC: 33 g/dL (ref 30.0–36.0)
MCHC: 31.8 g/dL (ref 30.0–36.0)
MCV: 88.8 fL (ref 78.0–100.0)
MCV: 90.7 fL (ref 78.0–100.0)
Platelets: 166 10*3/uL (ref 150–400)
Platelets: 186 10*3/uL (ref 150–400)
RBC: 2.94 MIL/uL — ABNORMAL LOW (ref 3.87–5.11)
RBC: 3.02 MIL/uL — AB (ref 3.87–5.11)
RDW: 14.4 % (ref 11.5–15.5)
RDW: 14.6 % (ref 11.5–15.5)
WBC: 14.5 10*3/uL — ABNORMAL HIGH (ref 4.0–10.5)
WBC: 13.6 10*3/uL — ABNORMAL HIGH (ref 4.0–10.5)

## 2017-10-08 LAB — BASIC METABOLIC PANEL
Anion gap: 7 (ref 5–15)
BUN: 15 mg/dL (ref 6–20)
CHLORIDE: 106 mmol/L (ref 101–111)
CO2: 24 mmol/L (ref 22–32)
CREATININE: 1.4 mg/dL — AB (ref 0.44–1.00)
Calcium: 8 mg/dL — ABNORMAL LOW (ref 8.9–10.3)
GFR calc Af Amer: 48 mL/min — ABNORMAL LOW (ref >60)
GFR calc non Af Amer: 42 mL/min — ABNORMAL LOW (ref >60)
GLUCOSE: 142 mg/dL — AB (ref 65–99)
Potassium: 4.6 mmol/L (ref 3.5–5.1)
Sodium: 137 mmol/L (ref 135–145)

## 2017-10-08 LAB — POCT I-STAT, CHEM 8
BUN: 19 mg/dL (ref 6–20)
CALCIUM ION: 1.18 mmol/L (ref 1.15–1.40)
CHLORIDE: 100 mmol/L — AB (ref 101–111)
Creatinine, Ser: 1.5 mg/dL — ABNORMAL HIGH (ref 0.44–1.00)
Glucose, Bld: 116 mg/dL — ABNORMAL HIGH (ref 65–99)
HCT: 26 % — ABNORMAL LOW (ref 36.0–46.0)
HEMOGLOBIN: 8.8 g/dL — AB (ref 12.0–15.0)
Potassium: 4.3 mmol/L (ref 3.5–5.1)
SODIUM: 139 mmol/L (ref 135–145)
TCO2: 26 mmol/L (ref 22–32)

## 2017-10-08 LAB — MAGNESIUM
MAGNESIUM: 2.8 mg/dL — AB (ref 1.7–2.4)
Magnesium: 3 mg/dL — ABNORMAL HIGH (ref 1.7–2.4)

## 2017-10-08 LAB — GLUCOSE, CAPILLARY
GLUCOSE-CAPILLARY: 146 mg/dL — AB (ref 65–99)
GLUCOSE-CAPILLARY: 114 mg/dL — AB (ref 65–99)
GLUCOSE-CAPILLARY: 131 mg/dL — AB (ref 65–99)
GLUCOSE-CAPILLARY: 155 mg/dL — AB (ref 65–99)
GLUCOSE-CAPILLARY: 123 mg/dL — AB (ref 65–99)
Glucose-Capillary: 158 mg/dL — ABNORMAL HIGH (ref 65–99)
Glucose-Capillary: 161 mg/dL — ABNORMAL HIGH (ref 65–99)
Glucose-Capillary: 109 mg/dL — ABNORMAL HIGH (ref 65–99)

## 2017-10-08 LAB — CREATININE, SERUM
Creatinine, Ser: 1.5 mg/dL — ABNORMAL HIGH (ref 0.44–1.00)
GFR calc Af Amer: 45 mL/min — ABNORMAL LOW (ref >60)
GFR, EST NON AFRICAN AMERICAN: 38 mL/min — AB (ref >60)

## 2017-10-08 MED ORDER — ORAL CARE MOUTH RINSE
15.0000 mL | Freq: Two times a day (BID) | OROMUCOSAL | Status: DC
  Administered 2017-10-08 – 2017-10-09 (×4): 15 mL via OROMUCOSAL

## 2017-10-08 MED ORDER — CHLORHEXIDINE GLUCONATE CLOTH 2 % EX PADS
6.0000 | MEDICATED_PAD | Freq: Every day | CUTANEOUS | Status: DC
  Administered 2017-10-08 – 2017-10-09 (×2): 6 via TOPICAL

## 2017-10-08 MED ORDER — SODIUM CHLORIDE 0.9% FLUSH
10.0000 mL | Freq: Two times a day (BID) | INTRAVENOUS | Status: DC
  Administered 2017-10-08 – 2017-10-09 (×3): 10 mL

## 2017-10-08 MED ORDER — ENOXAPARIN SODIUM 40 MG/0.4ML ~~LOC~~ SOLN
40.0000 mg | Freq: Every day | SUBCUTANEOUS | Status: DC
  Administered 2017-10-08 – 2017-10-15 (×8): 40 mg via SUBCUTANEOUS
  Filled 2017-10-08 (×8): qty 0.4

## 2017-10-08 MED ORDER — INSULIN ASPART 100 UNIT/ML ~~LOC~~ SOLN
0.0000 [IU] | SUBCUTANEOUS | Status: DC
  Administered 2017-10-08 (×2): 2 [IU] via SUBCUTANEOUS

## 2017-10-08 MED ORDER — SODIUM CHLORIDE 0.9% FLUSH: 10.0000 mL | INTRAVENOUS | Status: DC | PRN

## 2017-10-08 MED ORDER — FUROSEMIDE 10 MG/ML IJ SOLN
40.0000 mg | Freq: Once | INTRAMUSCULAR | Status: AC
  Administered 2017-10-08: 40 mg via INTRAVENOUS

## 2017-10-08 MED FILL — Thrombin For Soln 20000 Unit: CUTANEOUS | Qty: 1 | Status: AC

## 2017-10-08 NOTE — Progress Notes (Signed)
1 Day Post-Op Procedure(s) (LRB): CORONARY ARTERY BYPASS GRAFTING (CABG) ON PUMP TIMES 5 USING LEFT INTERNAL MAMMARY ARTERY AND BILATERAL GREATER SAPHENOUS VEIN VIA ENDOHARDEST. (N/A) TRANSESOPHAGEAL ECHOCARDIOGRAM (TEE) (N/A) Subjective: Sore  Objective: Vital signs in last 24 hours: Temp:  [95.9 F (35.5 C)-98.8 F (37.1 C)] 97.5 F (36.4 C) (03/22 0715) Pulse Rate:  [69-89] 75 (03/22 0715) Cardiac Rhythm: Normal sinus rhythm (03/22 0400) Resp:  [12-24] 15 (03/22 0715) BP: (92-134)/(63-108) 102/72 (03/22 0700) SpO2:  [95 %-100 %] 100 % (03/22 0715) Arterial Line BP: (108-147)/(60-87) 114/64 (03/22 0715) FiO2 (%):  [40 %-50 %] 40 % (03/21 1821) Weight:  [83.6 kg (184 lb 4.9 oz)] 83.6 kg (184 lb 4.9 oz) (03/22 0400)  Hemodynamic parameters for last 24 hours: PAP: (15-29)/(1-20) 23/9 CO:  [2.7 L/min-3.3 L/min] 2.7 L/min CI:  [1.4 L/min/m2-1.8 L/min/m2] 1.4 L/min/m2  Intake/Output from previous day: 03/21 0701 - 03/22 0700 In: 4772.8 [P.O.:480; I.V.:3302.8; Blood:300; NG/GT:40; IV Piggyback:650] Out: 9371 [Urine:2435; Blood:600; Chest Tube:308] Intake/Output this shift: No intake/output data recorded.  General appearance: alert and cooperative Neurologic: left sided weakness and present preop from prior stroke Heart: regular rate and rhythm, S1, S2 normal, no murmur, click, rub or gallop Lungs: clear to auscultation bilaterally Extremities: edema mild Wound: dressing dry  Lab Results: Recent Labs    10/07/17 2013 10/07/17 2017 10/08/17 0259  WBC 20.1*  --  14.5*  HGB 9.1* 8.2* 8.6*  HCT 27.6* 24.0* 26.1*  PLT 188  --  166   BMET:  Recent Labs    10/07/17 0310  10/07/17 2017 10/08/17 0259  NA 139   < > 140 137  K 3.2*   < > 5.5* 4.6  CL 107   < > 104 106  CO2 24  --   --  24  GLUCOSE 109*   < > 161* 142*  BUN 16   < > 14 15  CREATININE 1.35*   < > 1.30* 1.40*  CALCIUM 8.5*  --   --  8.0*   < > = values in this interval not displayed.    PT/INR:  Recent  Labs    10/07/17 1410  LABPROT 18.1*  INR 1.51   ABG    Component Value Date/Time   PHART 7.436 10/07/2017 1919   HCO3 25.0 10/07/2017 1919   TCO2 24 10/07/2017 2017   ACIDBASEDEF 4.0 (H) 10/07/2017 1842   O2SAT 99.0 10/07/2017 1919   CBG (last 3)  Recent Labs    10/07/17 1959 10/08/17 0015 10/08/17 0400  GLUCAP 158* 161* 146*   CXR: clear  ECG: sinus, non-specific T-wave abnormality  Assessment/Plan: S/P Procedure(s) (LRB): CORONARY ARTERY BYPASS GRAFTING (CABG) ON PUMP TIMES 5 USING LEFT INTERNAL MAMMARY ARTERY AND BILATERAL GREATER SAPHENOUS VEIN VIA ENDOHARDEST. (N/A) TRANSESOPHAGEAL ECHOCARDIOGRAM (TEE) (N/A)  POD 1 Hemodynamically stable in sinus rhythm. She has poorly controlled HTN. Will resume her Coreg instead of Lopressor. Her creat is mildly elevated so wait to resume Cozaar.  Glucose is under control and Hgb A1c was 5.8 on no meds so will continue SSI today and expect that should be able to stop following soon.  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 She has some residual weakness but ambulatory with cane preop. Will ask PT to see her.  DC chest tubes, swan, arterial line.  She is essentially homeless and was sleeping on her cousin's couch preop so will need SNF at discharge. She has significant disability after stroke and now CABG and will not  be able to work. 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 She will need to go to SNF at discharge.   LOS: 2 days    Gaye Pollack 10/08/2017

## 2017-10-08 NOTE — Progress Notes (Signed)
   10/08/17 1000  Clinical Encounter Type  Visited With Patient  Visit Type Spiritual support  Referral From Nurse  Consult/Referral To Chaplain  Recommendations  (follow-up)  Spiritual Encounters  Spiritual Needs Prayer;Emotional  Stress Factors  Patient Stress Factors Exhausted;Family relationships;Financial concerns;Health changes  Family Stress Factors Major life changes   Making morning rounds and visited Pt to offer emotional and spiritual support. Pt was resting in bed and feeling very tired from her surgery. However, she did want to talk and began to share her concerns. Pt state that she is not close to any family members and they have not come to see her. Nurse reported that 7 people called to check on her. Pt said that she have a female friend of 3 weeks and he loves her and wants to marry her. Pt is currently homeless and has spoken with social worker about her present conditions. Pt is looking forward to getting her medicaid and disability so she can support herself and get an apartment. Chaplain encouraged Pt to continue to get well and look forward to her new future. Chaplain provided prayer and told her that she will follow up with her on Monday with some reading material to encourage her as she recovers. Matthew Folks

## 2017-10-08 NOTE — Progress Notes (Signed)
TCTS BRIEF SICU PROGRESS NOTE  1 Day Post-Op  S/P Procedure(s) (LRB): CORONARY ARTERY BYPASS GRAFTING (CABG) ON PUMP TIMES 5 USING LEFT INTERNAL MAMMARY ARTERY AND BILATERAL GREATER SAPHENOUS VEIN VIA ENDOHARDEST. (N/A) TRANSESOPHAGEAL ECHOCARDIOGRAM (TEE) (N/A)   Stable day NSR w/ stable BP Breathing comfortably w/ O2 sats 92-94% on 2 L/min UOP adequate Labs okay  Plan: Continue current plan  Rexene Alberts, MD 10/08/2017 6:44 PM

## 2017-10-09 ENCOUNTER — Inpatient Hospital Stay (HOSPITAL_COMMUNITY): Payer: Medicaid Other

## 2017-10-09 LAB — BASIC METABOLIC PANEL
Anion gap: 10 (ref 5–15)
BUN: 17 mg/dL (ref 6–20)
CO2: 25 mmol/L (ref 22–32)
CREATININE: 1.36 mg/dL — AB (ref 0.44–1.00)
Calcium: 8.2 mg/dL — ABNORMAL LOW (ref 8.9–10.3)
Chloride: 100 mmol/L — ABNORMAL LOW (ref 101–111)
GFR calc Af Amer: 50 mL/min — ABNORMAL LOW (ref >60)
GFR calc non Af Amer: 43 mL/min — ABNORMAL LOW (ref >60)
GLUCOSE: 119 mg/dL — AB (ref 65–99)
Potassium: 3.7 mmol/L (ref 3.5–5.1)
Sodium: 135 mmol/L (ref 135–145)

## 2017-10-09 LAB — GLUCOSE, CAPILLARY
GLUCOSE-CAPILLARY: 129 mg/dL — AB (ref 65–99)
GLUCOSE-CAPILLARY: 105 mg/dL — AB (ref 65–99)
Glucose-Capillary: 113 mg/dL — ABNORMAL HIGH (ref 65–99)
Glucose-Capillary: 101 mg/dL — ABNORMAL HIGH (ref 65–99)
Glucose-Capillary: 117 mg/dL — ABNORMAL HIGH (ref 65–99)

## 2017-10-09 LAB — CBC
HCT: 27 % — ABNORMAL LOW (ref 36.0–46.0)
Hemoglobin: 8.9 g/dL — ABNORMAL LOW (ref 12.0–15.0)
MCH: 30.2 pg (ref 26.0–34.0)
MCHC: 33 g/dL (ref 30.0–36.0)
MCV: 91.5 fL (ref 78.0–100.0)
PLATELETS: 186 10*3/uL (ref 150–400)
RBC: 2.95 MIL/uL — ABNORMAL LOW (ref 3.87–5.11)
RDW: 15.1 % (ref 11.5–15.5)
WBC: 12.3 10*3/uL — ABNORMAL HIGH (ref 4.0–10.5)

## 2017-10-09 MED ORDER — FUROSEMIDE 10 MG/ML IJ SOLN
INTRAMUSCULAR | Status: AC
  Filled 2017-10-09: qty 4

## 2017-10-09 MED ORDER — FUROSEMIDE 10 MG/ML IJ SOLN
40.0000 mg | Freq: Once | INTRAMUSCULAR | Status: AC
  Administered 2017-10-09: 40 mg via INTRAVENOUS

## 2017-10-09 MED ORDER — POTASSIUM CHLORIDE CRYS ER 20 MEQ PO TBCR
20.0000 meq | EXTENDED_RELEASE_TABLET | Freq: Every day | ORAL | Status: DC
  Administered 2017-10-09 – 2017-10-11 (×3): 20 meq via ORAL
  Filled 2017-10-09 (×3): qty 1

## 2017-10-09 MED ORDER — CARVEDILOL 25 MG PO TABS
25.0000 mg | ORAL_TABLET | Freq: Two times a day (BID) | ORAL | Status: DC
  Administered 2017-10-09 – 2017-10-16 (×14): 25 mg via ORAL
  Filled 2017-10-09 (×14): qty 1

## 2017-10-09 MED ORDER — POTASSIUM CHLORIDE CRYS ER 20 MEQ PO TBCR
20.0000 meq | EXTENDED_RELEASE_TABLET | ORAL | Status: DC | PRN
  Administered 2017-10-09: 20 meq via ORAL
  Filled 2017-10-09 (×2): qty 1

## 2017-10-09 MED ORDER — ALBUTEROL SULFATE (2.5 MG/3ML) 0.083% IN NEBU: 2.5000 mg | INHALATION_SOLUTION | Freq: Four times a day (QID) | RESPIRATORY_TRACT | Status: DC | PRN

## 2017-10-09 MED ORDER — FUROSEMIDE 40 MG PO TABS
40.0000 mg | ORAL_TABLET | Freq: Every day | ORAL | Status: DC
  Administered 2017-10-10 – 2017-10-11 (×2): 40 mg via ORAL
  Filled 2017-10-09 (×2): qty 1

## 2017-10-09 MED ORDER — TRAZODONE HCL 50 MG PO TABS
25.0000 mg | ORAL_TABLET | Freq: Every evening | ORAL | Status: DC | PRN
Start: 2017-10-09 — End: 2017-10-16
  Administered 2017-10-09 – 2017-10-14 (×2): 25 mg via ORAL
  Filled 2017-10-09 (×2): qty 1

## 2017-10-09 MED ORDER — BUPROPION HCL ER (SR) 150 MG PO TB12
150.0000 mg | ORAL_TABLET | Freq: Two times a day (BID) | ORAL | Status: DC
  Administered 2017-10-09 – 2017-10-11 (×4): 150 mg via ORAL
  Filled 2017-10-09 (×4): qty 1

## 2017-10-09 MED ORDER — MOVING RIGHT ALONG BOOK
Freq: Once | Status: AC
  Administered 2017-10-09: 10:00:00
  Filled 2017-10-09: qty 1

## 2017-10-09 MED ORDER — INSULIN ASPART 100 UNIT/ML ~~LOC~~ SOLN: 0.0000 [IU] | Freq: Three times a day (TID) | SUBCUTANEOUS | Status: DC

## 2017-10-09 NOTE — Progress Notes (Signed)
TCTS BRIEF SICU PROGRESS NOTE  2 Days Post-Op  S/P Procedure(s) (LRB): CORONARY ARTERY BYPASS GRAFTING (CABG) ON PUMP TIMES 5 USING LEFT INTERNAL MAMMARY ARTERY AND BILATERAL GREATER SAPHENOUS VEIN VIA ENDOHARDEST. (N/A) TRANSESOPHAGEAL ECHOCARDIOGRAM (TEE) (N/A)   Stable day NSR w/ stable BP Breathing comfortably on room air Diuresing some  Plan: Continue current plan  Rexene Alberts, MD 10/09/2017 5:55 PM

## 2017-10-09 NOTE — Progress Notes (Signed)
[address]       [place] [address]             [phone]        CARDIOTHORACIC SURGERY PROGRESS NOTE   R2 Days Post-Op Procedure(s) (LRB): CORONARY ARTERY BYPASS GRAFTING (CABG) ON PUMP TIMES 5 USING LEFT INTERNAL MAMMARY ARTERY AND BILATERAL GREATER SAPHENOUS VEIN VIA ENDOHARDEST. (N/A) TRANSESOPHAGEAL ECHOCARDIOGRAM (TEE) (N/A)  Subjective: Looks good.  Mild soreness in chest and back.  Denies SOB  Objective: Vital signs: BP Readings from Last 1 Encounters:  10/09/17 109/79   Pulse Readings from Last 1 Encounters:  10/09/17 81   Resp Readings from Last 1 Encounters:  10/09/17 20   Temp Readings from Last 1 Encounters:  10/09/17 97.8 F (36.6 C) (Oral)    Hemodynamics:    Physical Exam:  Rhythm:   sinus  Breath sounds: clear  Heart sounds:  RRR  Incisions:  Dressing dry, intact  Abdomen:  Soft, non-distended, non-tender  Extremities:  Warm, well-perfused   Intake/Output from previous day: 03/22 0701 - 03/23 0700 In: 1214 [P.O.:720; I.V.:494] Out: 1050 [Urine:1030; Chest Tube:20] Intake/Output this shift: Total I/O In: -  Out: 40 [Urine:40]  Lab Results:  CBC: Recent Labs    10/08/17 1638 10/09/17 0420  WBC 13.6* 12.3*  HGB 8.7* 8.9*  HCT 27.4* 27.0*  PLT 186 186    BMET:  Recent Labs    10/08/17 0259 10/08/17 1632 10/08/17 1638 10/09/17 0420  NA 137 139  --  135  K 4.6 4.3  --  3.7  CL 106 100*  --  100*  CO2 24  --   --  25  GLUCOSE 142* 116*  --  119*  BUN 15 19  --  17  CREATININE 1.40* 1.50* 1.50* 1.36*  CALCIUM 8.0*  --   --  8.2*     PT/INR:   Recent Labs    10/07/17 1410  LABPROT 18.1*  INR 1.51    CBG (last 3)  Recent Labs    10/08/17 2311 10/09/17 0429 10/09/17 0757  GLUCAP 123* 129* 113*    ABG    Component Value Date/Time   PHART 7.436 10/07/2017 1919   PCO2ART 37.1 10/07/2017 1919   PO2ART 114.0 (H) 10/07/2017 1919   HCO3 25.0 10/07/2017 1919   TCO2 26 10/08/2017 1632   ACIDBASEDEF 4.0 (H) 10/07/2017 1842   O2SAT 99.0 10/07/2017 1919    CXR: PORTABLE CHEST 1 VIEW  COMPARISON:  Yesterday.  FINDINGS: The right jugular Swan-Ganz catheter has been removed and the sheath remains in place. The mediastinal and left chest tubes have also been removed. No pneumothorax. Stable enlarged cardiac silhouette and left basilar atelectasis. Minimal right pleural effusion. Unremarkable bones.  IMPRESSION: 1. Minimal right pleural effusion. 2. Stable cardiomegaly and left basilar atelectasis.   Electronically Signed   By: Claudie Revering M.D.   On: 10/09/2017 07:52   Assessment/Plan: S/P Procedure(s) (LRB): CORONARY ARTERY BYPASS GRAFTING (CABG) ON PUMP TIMES 5 USING LEFT INTERNAL MAMMARY ARTERY AND BILATERAL GREATER SAPHENOUS VEIN VIA ENDOHARDEST. (N/A) TRANSESOPHAGEAL ECHOCARDIOGRAM (TEE) (N/A)  Doing well POD2 Maintaining NSR w/ stable BP Breathing comfortably w/ O2 sats 93% on 2 L/min Expected post op acute blood loss anemia, mild, stable Expected post op atelectasis, mild Expected post op volume excess, weight reportedly 8 lbs > preop, UOP adequate Post op elevated serum creatinine, likely due to prerenal azotemia +/- acute kidney injury caused by ATN, UOP adequate  and creatinine trending down Previous stroke   Mobilize  Diuresis  PT consult to assist w/ mobility and recovery  Transfer step down  Rexene Alberts, MD 10/09/2017 9:08 AM

## 2017-10-09 NOTE — Evaluation (Signed)
Physical Therapy Evaluation Patient Details Name: Karen Ochoa MRN: [id number] DOB: [birth year] Today's Date: 10/09/2017   History of Present Illness  Pt is a 52 y.o. female admitted 10/05/17 for heart cath showing severe multivessel disease. 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 PMH includes CVA (06/2017 residual L-side weakness), HTN, asthma.    Clinical Impression  Pt presents with an overall decrease in functional mobility secondary to above. PTA, pt mod indep with SPC due to L-side residual weakness since recent CVA (06/2017); pt currently homeless and unsure family/friend support available at discharge. Educ on precautions and importance of mobility. Today, pt able to amb 150' with eva walker and minA for balance; reliant on HHA and minA when amb with no DME. Pt would benefit from continued acute PT services to maximize functional mobility and independence prior to d/c with SNF-level therapies.     Follow Up Recommendations SNF;Supervision for mobility/OOB    Equipment Recommendations  (TBD next venue)    Recommendations for Other Services       Precautions / Restrictions Precautions Precautions: Sternal;Fall Precaution Booklet Issued: No Precaution Comments: Verbally reviewed precautions (handout provided by cardiac rehab) Restrictions Other Position/Activity Restrictions: Sternal      Mobility  Bed Mobility Overal bed mobility: Needs Assistance Bed Mobility: Rolling;Sidelying to Sit Rolling: Min guard Sidelying to sit: Min assist       General bed mobility comments: Educ on log roll technique to maintain sternal precautions. MinA to assist trunk elevation and cues to decrease pushing with BUEs  Transfers Overall transfer level: Needs assistance Equipment used: 4-wheeled walker Transfers: Sit to/from Stand Sit to Stand: Min assist         General transfer comment: Cues for technique standing with bilat hands on knees; required minA to prevent posterior LOB upon  standing  Ambulation/Gait Ambulation/Gait assistance: Min assist Ambulation Distance (Feet): 150 Feet Assistive device: 4-wheeled walker;1 person hand held assist Gait Pattern/deviations: Step-through pattern;Decreased stride length;Drifts right/left Gait velocity: Decreased Gait velocity interpretation: <1.8 ft/sec, indicative of risk for recurrent falls General Gait Details: Slow, mildly unsteady amb with RW and intermittent minA to steady RW. Pt c/o LLE soreness and fatigue preventing further amb distances. Upon return to room, amb with HHA requiring minA to maintain balance  Stairs            Wheelchair Mobility    Modified Rankin (Stroke Patients Only)       Balance Overall balance assessment: Needs assistance   Sitting balance-Leahy Scale: Fair       Standing balance-Leahy Scale: Poor                               Pertinent Vitals/Pain Pain Assessment: Faces Faces Pain Scale: Hurts a little bit Pain Location: Incision site Pain Descriptors / Indicators: Sore;Guarding Pain Intervention(s): Monitored during session    Home Living Family/patient expects to be discharged to:: Skilled nursing facility                 Additional Comments: Pt currently homeless and unsure family/friends she can discharge home with    Prior Function Level of Independence: Independent with assistive device(s)         Comments: Mod indep with SPC secondary to residual L-side weakness (leg>arm)     Hand Dominance        Extremity/Trunk Assessment   Upper Extremity Assessment Upper Extremity Assessment: LUE deficits/detail LUE Deficits / Details: Decreased grip strength; shoulder  grossly 3/5, elbow 4/5 LUE Sensation: decreased light touch LUE Coordination: decreased fine motor    Lower Extremity Assessment Lower Extremity Assessment: LLE deficits/detail LLE Deficits / Details: Strengthy grossly 4/5 throughout LLE Sensation: decreased light  touch LLE Coordination: decreased fine motor       Communication   Communication: No difficulties  Cognition Arousal/Alertness: Awake/alert Behavior During Therapy: WFL for tasks assessed/performed Overall Cognitive Status: No family/caregiver present to determine baseline cognitive functioning Area of Impairment: Following commands;Safety/judgement;Problem solving                       Following Commands: Follows multi-step commands with increased time     Problem Solving: Slow processing;Requires verbal cues        General Comments General comments (skin integrity, edema, etc.): SpO2 >90% on RA    Exercises     Assessment/Plan    PT Assessment Patient needs continued PT services  PT Problem List Decreased strength;Decreased activity tolerance;Decreased balance;Decreased mobility;Decreased knowledge of use of DME;Decreased knowledge of precautions       PT Treatment Interventions Gait training;DME instruction;Stair training;Functional mobility training;Therapeutic activities;Therapeutic exercise;Balance training;Patient/family education    PT Goals (Current goals can be found in the Care Plan section)  Acute Rehab PT Goals Patient Stated Goal: Get stronger at rehab; figure out plans for support once leaving SNF PT Goal Formulation: With patient Time For Goal Achievement: 10/23/17 Potential to Achieve Goals: Good    Frequency Min 2X/week   Barriers to discharge Decreased caregiver support;Inaccessible home environment      Co-evaluation               AM-PAC PT "6 Clicks" Daily Activity  Outcome Measure Difficulty turning over in bed (including adjusting bedclothes, sheets and blankets)?: A Little Difficulty moving from lying on back to sitting on the side of the bed? : Unable Difficulty sitting down on and standing up from a chair with arms (e.g., wheelchair, bedside commode, etc,.)?: Unable Help needed moving to and from a bed to chair (including  a wheelchair)?: A Little Help needed walking in hospital room?: A Little Help needed climbing 3-5 steps with a railing? : A Lot 6 Click Score: 13    End of Session Equipment Utilized During Treatment: Gait belt Activity Tolerance: Patient tolerated treatment well;Patient limited by fatigue Patient left: in chair;with call bell/phone within reach;with family/visitor present Nurse Communication: Mobility status PT Visit Diagnosis: Other abnormalities of gait and mobility (R26.89)    Time: 1331-1400 PT Time Calculation (min) (ACUTE ONLY): 29 min   Charges:   PT Evaluation $PT Eval Moderate Complexity: 1 Mod PT Treatments $Therapeutic Activity: 8-22 mins   PT G Codes:       Mabeline Caras, PT, DPT Acute Rehab Services  Pager: [place] 10/09/2017, 3:21 PM

## 2017-10-10 ENCOUNTER — Inpatient Hospital Stay (HOSPITAL_COMMUNITY): Payer: Medicaid Other

## 2017-10-10 LAB — TYPE AND SCREEN
ABO/RH(D): O POS
ANTIBODY SCREEN: NEGATIVE
UNIT DIVISION: 0
UNIT DIVISION: 0
Unit division: 0
Unit division: 0

## 2017-10-10 LAB — BPAM RBC
BLOOD PRODUCT EXPIRATION DATE: 201904162359
BLOOD PRODUCT EXPIRATION DATE: 201904162359
Blood Product Expiration Date: 201904162359
Blood Product Expiration Date: 201904162359
ISSUE DATE / TIME: 201903210830
ISSUE DATE / TIME: 201903210830
UNIT TYPE AND RH: 5100
Unit Type and Rh: 5100
Unit Type and Rh: 5100
Unit Type and Rh: 5100

## 2017-10-10 LAB — CBC
HEMATOCRIT: 27.3 % — AB (ref 36.0–46.0)
Hemoglobin: 8.8 g/dL — ABNORMAL LOW (ref 12.0–15.0)
MCH: 29.5 pg (ref 26.0–34.0)
MCHC: 32.2 g/dL (ref 30.0–36.0)
MCV: 91.6 fL (ref 78.0–100.0)
Platelets: 177 10*3/uL (ref 150–400)
RBC: 2.98 MIL/uL — ABNORMAL LOW (ref 3.87–5.11)
RDW: 14.9 % (ref 11.5–15.5)
WBC: 11.6 10*3/uL — AB (ref 4.0–10.5)

## 2017-10-10 LAB — BASIC METABOLIC PANEL
ANION GAP: 10 (ref 5–15)
BUN: 20 mg/dL (ref 6–20)
CO2: 24 mmol/L (ref 22–32)
CREATININE: 1.39 mg/dL — AB (ref 0.44–1.00)
Calcium: 8.4 mg/dL — ABNORMAL LOW (ref 8.9–10.3)
Chloride: 104 mmol/L (ref 101–111)
GFR, EST AFRICAN AMERICAN: 49 mL/min — AB (ref >60)
GFR, EST NON AFRICAN AMERICAN: 42 mL/min — AB (ref >60)
Glucose, Bld: 111 mg/dL — ABNORMAL HIGH (ref 65–99)
Potassium: 4.2 mmol/L (ref 3.5–5.1)
SODIUM: 138 mmol/L (ref 135–145)

## 2017-10-10 LAB — GLUCOSE, CAPILLARY
GLUCOSE-CAPILLARY: 103 mg/dL — AB (ref 65–99)
GLUCOSE-CAPILLARY: 88 mg/dL (ref 65–99)
GLUCOSE-CAPILLARY: 113 mg/dL — AB (ref 65–99)
GLUCOSE-CAPILLARY: 105 mg/dL — AB (ref 65–99)
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
## 2017-10-10 NOTE — NC FL2 (Signed)
[place] MEDICAID FL2 LEVEL OF CARE SCREENING TOOL     IDENTIFICATION  Patient Name: Karen Ochoa Birthdate: 1966/05/05 Sex: female Admission Date (Current Location): 10/05/2017  [facility] and [place] Number:  Herbalist and Address:  [facility] [place], [place] [address] [place], [place] [address]      Provider Number: [id number]  Attending Physician Name and Address:  Gaye Pollack, MD  Relative Name and Phone Number:       Current Level of Care: Hospital Recommended Level of Care: [place] Prior Approval Number:    Date Approved/Denied:   PASRR Number: [id number]  Discharge Plan: SNF    Current Diagnoses: Patient Active Problem List   Diagnosis Date Noted  . S/P CABG x 5 10/07/2017  . Coronary artery disease 10/06/2017  . CAD (coronary artery disease) 10/05/2017  . Coronary artery disease of native artery of native heart with stable angina pectoris ([place])   . 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 Chronic renal insufficiency, stage III (moderate) ([place]) 07/21/2017  . Carotid artery disease ([place]) 07/21/2017  . Chronic systolic CHF (congestive heart failure) ([place])   . White matter abnormality on MRI of brain   . Cerebrovascular accident (CVA) ([place]) 07/19/2017  . Renal insufficiency 07/19/2017  . Smoker 06/18/2009  . History of syncope 2010 06/18/2009  . INSOMNIA, CHRONIC 05/27/2009  . HTN (hypertension) 05/27/2009  . History of TIAs 05/27/2009  . HEADACHE, CHRONIC, HX OF 05/27/2009    Orientation RESPIRATION BLADDER Height & Weight     Self  Normal Continent Weight: 178 lb 5.6 oz (80.9 kg) Height:  5\' 7"  (170.2 cm)  BEHAVIORAL SYMPTOMS/MOOD NEUROLOGICAL BOWEL NUTRITION STATUS      Continent Diet(Clear liquid, thin liquids)  AMBULATORY STATUS COMMUNICATION OF NEEDS Skin   Limited Assist Verbally Surgical wounds(Closed incision, chest, Silver hydrofiber dressing. Closed incision  left leg. Closed incision right leg.)                       Personal Care Assistance Level of Assistance  Bathing, Feeding, Dressing Bathing Assistance: Limited assistance Feeding assistance: Independent Dressing Assistance: Limited assistance     Functional Limitations Info  Sight, Hearing, Speech Sight Info: Adequate Hearing Info: Adequate Speech Info: Adequate    SPECIAL CARE FACTORS FREQUENCY  PT (By licensed PT), OT (By licensed OT)     PT Frequency: 2x OT Frequency: 2x            Contractures Contractures Info: Not present    Additional Factors Info  Code Status, Allergies Code Status Info: Full Code Allergies Info: Penicillins, Sulfonamide Derivatives, Clonidine Derivatives, Flagyl Metronidazole, Labetalol           Current Medications (10/10/2017):  This is the current hospital active medication list Current Facility-Administered Medications  Medication Dose Route Frequency Provider Last Rate Last Dose  . 0.9 %  sodium chloride infusion  250 mL Intravenous Continuous Elgie Collard, PA-C 1 mL/hr at 10/08/17 0600    . acetaminophen (TYLENOL) tablet 1,000 mg  1,000 mg Oral Q6H Conte, Tessa N, PA-C   1,000 mg at 10/09/17 2319  . albuterol (PROVENTIL) (2.5 MG/3ML) 0.083% nebulizer solution 2.5 mg  2.5 mg Nebulization Q6H PRN Gaye Pollack, MD      . aspirin EC tablet 325 mg  325 mg Oral Daily Conte, Tessa N, PA-C   325 mg at 10/09/17 0900  . atorvastatin (LIPITOR) tablet  80 mg  80 mg Oral q1800 Elgie Collard, PA-C   80 mg at 10/09/17 1713  . bisacodyl (DULCOLAX) EC tablet 10 mg  10 mg Oral Daily Nicholes Rough N, [place]   10 mg at 10/09/17 0901   Or  . bisacodyl (DULCOLAX) suppository 10 mg  10 mg Rectal Daily Conte, Tessa N, PA-C      . buPROPion [facility] SR) 12 hr tablet 150 mg  150 mg Oral BID Rexene Alberts, MD   150 mg at 10/09/17 2113  . carvedilol (COREG) tablet 25 mg  25 mg Oral BID WC Rexene Alberts, MD   25 mg at 10/10/17 0730  .  Chlorhexidine Gluconate Cloth 2 % PADS 6 each  6 each Topical Daily Gaye Pollack, MD   6 each at 10/09/17 0911  . docusate sodium (COLACE) capsule 200 mg  200 mg Oral Daily Conte, Tessa N, PA-C   200 mg at 10/09/17 0900  . enoxaparin (LOVENOX) injection 40 mg  40 mg Subcutaneous QHS Gaye Pollack, MD   40 mg at 10/09/17 2113  . furosemide (LASIX) tablet 40 mg  40 mg Oral Daily Rexene Alberts, MD      . hydrALAZINE (APRESOLINE) injection 10 mg  10 mg Intravenous Q4H PRN Gaye Pollack, MD   10 mg at 10/09/17 0148  . insulin aspart (novoLOG) injection 0-24 Units  0-24 Units Subcutaneous TID AC & HS Rexene Alberts, MD      . MEDLINE mouth rinse  15 mL Mouth Rinse BID Gaye Pollack, MD   15 mL at 10/09/17 0914  . morphine 2 MG/ML injection 2-5 mg  2-5 mg Intravenous Q1H PRN Harriet Pho, Tessa N, PA-C   2 mg at 10/09/17 0900  . ondansetron (ZOFRAN) injection 4 mg  4 mg Intravenous Q6H PRN Harriet Pho, Tessa N, PA-C   4 mg at 10/09/17 1348  . oxyCODONE (Oxy IR/ROXICODONE) immediate release tablet 5-10 mg  5-10 mg Oral Q3H PRN Harriet Pho, Tessa N, PA-C   5 mg at 10/10/17 0730  . pantoprazole (PROTONIX) EC tablet 40 mg  40 mg Oral Daily Elgie Collard, PA-C   40 mg at 10/09/17 0901  . potassium chloride SA (K-DUR,KLOR-CON) CR tablet 20 mEq  20 mEq Oral Q4H PRN Gaye Pollack, MD   20 mEq at 10/09/17 0909  . potassium chloride SA (K-DUR,KLOR-CON) CR tablet 20 mEq  20 mEq Oral Daily Rexene Alberts, MD   20 mEq at 10/09/17 1234  . sodium chloride flush (NS) 0.9 % injection 10-40 mL  10-40 mL Intracatheter Q12H Gaye Pollack, MD   10 mL at 10/09/17 0910  . sodium chloride flush (NS) 0.9 % injection 3 mL  3 mL Intravenous Q12H Elgie Collard, PA-C   3 mL at 10/09/17 2114  . sodium chloride flush (NS) 0.9 % injection 3 mL  3 mL Intravenous PRN Harriet Pho, Tessa N, PA-C      . traMADol Veatrice [place]) tablet 50-100 mg  50-100 mg Oral Q4H PRN Harriet Pho, Tessa N, PA-C   100 mg at 10/09/17 1836  . traZODone (DESYREL) tablet 25 mg   25 mg Oral QHS PRN Rexene Alberts, MD   25 mg at 10/09/17 2320     Discharge Medications: Please see discharge summary for a list of discharge medications.  Relevant Imaging Results:  Relevant Lab Results:   Additional Information SSN:[id number]  Gerrianne Scale Austan Nicholl, LCSW
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
## 2017-10-11 LAB — CBC
HCT: 23.8 % — ABNORMAL LOW (ref 36.0–46.0)
HEMOGLOBIN: 7.8 g/dL — AB (ref 12.0–15.0)
MCH: 29.5 pg (ref 26.0–34.0)
MCHC: 32.8 g/dL (ref 30.0–36.0)
MCV: 90.2 fL (ref 78.0–100.0)
Platelets: 194 10*3/uL (ref 150–400)
RBC: 2.64 MIL/uL — ABNORMAL LOW (ref 3.87–5.11)
RDW: 14.9 % (ref 11.5–15.5)
WBC: 9.6 10*3/uL (ref 4.0–10.5)

## 2017-10-11 LAB — BASIC METABOLIC PANEL
Anion gap: 9 (ref 5–15)
BUN: 22 mg/dL — AB (ref 6–20)
CALCIUM: 8.1 mg/dL — AB (ref 8.9–10.3)
CO2: 23 mmol/L (ref 22–32)
Chloride: 105 mmol/L (ref 101–111)
Creatinine, Ser: 1.46 mg/dL — ABNORMAL HIGH (ref 0.44–1.00)
GFR calc Af Amer: 46 mL/min — ABNORMAL LOW (ref >60)
GFR, EST NON AFRICAN AMERICAN: 40 mL/min — AB (ref >60)
GLUCOSE: 87 mg/dL (ref 65–99)
Potassium: 3.7 mmol/L (ref 3.5–5.1)
Sodium: 137 mmol/L (ref 135–145)

## 2017-10-11 LAB — GLUCOSE, CAPILLARY: GLUCOSE-CAPILLARY: 89 mg/dL (ref 65–99)

## 2017-10-11 MED ORDER — POTASSIUM CHLORIDE CRYS ER 20 MEQ PO TBCR
20.0000 meq | EXTENDED_RELEASE_TABLET | Freq: Two times a day (BID) | ORAL | Status: AC
  Administered 2017-10-11 – 2017-10-12 (×3): 20 meq via ORAL
  Filled 2017-10-11 (×3): qty 1

## 2017-10-11 MED ORDER — FUROSEMIDE 40 MG PO TABS
40.0000 mg | ORAL_TABLET | Freq: Every day | ORAL | Status: AC
  Administered 2017-10-12: 40 mg via ORAL
  Filled 2017-10-11: qty 1

## 2017-10-11 MED ORDER — ASPIRIN EC 325 MG PO TBEC
325.0000 mg | DELAYED_RELEASE_TABLET | Freq: Every day | ORAL | Status: DC
  Administered 2017-10-12 – 2017-10-16 (×5): 325 mg via ORAL
  Filled 2017-10-11 (×5): qty 1

## 2017-10-11 MED ORDER — TRAMADOL HCL 50 MG PO TABS
50.0000 mg | ORAL_TABLET | Freq: Four times a day (QID) | ORAL | Status: DC | PRN
  Administered 2017-10-11 – 2017-10-14 (×5): 50 mg via ORAL
  Filled 2017-10-11 (×5): qty 1

## 2017-10-11 MED ORDER — MOVING RIGHT ALONG BOOK
Freq: Once | Status: DC
  Filled 2017-10-11: qty 1

## 2017-10-11 MED ORDER — ONDANSETRON HCL 4 MG/2ML IJ SOLN
4.0000 mg | Freq: Four times a day (QID) | INTRAMUSCULAR | Status: DC | PRN
  Administered 2017-10-15: 4 mg via INTRAVENOUS
  Filled 2017-10-11: qty 2

## 2017-10-11 MED ORDER — SODIUM CHLORIDE 0.9% FLUSH: 3.0000 mL | INTRAVENOUS | Status: DC | PRN

## 2017-10-11 MED ORDER — POTASSIUM CHLORIDE CRYS ER 20 MEQ PO TBCR
20.0000 meq | EXTENDED_RELEASE_TABLET | ORAL | Status: DC | PRN
  Administered 2017-10-11: 20 meq via ORAL

## 2017-10-11 MED ORDER — OXYCODONE HCL 5 MG PO TABS
5.0000 mg | ORAL_TABLET | ORAL | Status: DC | PRN
  Administered 2017-10-12 – 2017-10-16 (×19): 5 mg via ORAL
  Filled 2017-10-11 (×20): qty 1

## 2017-10-11 MED ORDER — FE FUMARATE-B12-VIT C-FA-IFC PO CAPS
1.0000 | ORAL_CAPSULE | Freq: Three times a day (TID) | ORAL | Status: DC
  Administered 2017-10-11 – 2017-10-16 (×16): 1 via ORAL
  Filled 2017-10-11 (×16): qty 1

## 2017-10-11 MED ORDER — PANTOPRAZOLE SODIUM 40 MG PO TBEC
40.0000 mg | DELAYED_RELEASE_TABLET | Freq: Every day | ORAL | Status: DC
  Administered 2017-10-12 – 2017-10-16 (×5): 40 mg via ORAL
  Filled 2017-10-11 (×5): qty 1

## 2017-10-11 MED ORDER — SODIUM CHLORIDE 0.9 % IV SOLN: 250.0000 mL | INTRAVENOUS | Status: DC | PRN

## 2017-10-11 MED ORDER — ONDANSETRON HCL 4 MG PO TABS
4.0000 mg | ORAL_TABLET | Freq: Four times a day (QID) | ORAL | Status: DC | PRN
  Administered 2017-10-13: 4 mg via ORAL
  Filled 2017-10-11: qty 1

## 2017-10-11 MED ORDER — SODIUM CHLORIDE 0.9% FLUSH
3.0000 mL | Freq: Two times a day (BID) | INTRAVENOUS | Status: DC
  Administered 2017-10-11 – 2017-10-15 (×9): 3 mL via INTRAVENOUS

## 2017-10-11 MED ORDER — ACETAMINOPHEN 325 MG PO TABS
650.0000 mg | ORAL_TABLET | Freq: Four times a day (QID) | ORAL | Status: DC | PRN
  Administered 2017-10-11 – 2017-10-16 (×6): 650 mg via ORAL
  Filled 2017-10-11 (×6): qty 2

## 2017-10-11 NOTE — Progress Notes (Signed)
TCTS  Stable day, walked x 3 nsr O2 sat 99%, BP 115/90 Waiting on 4E bed

## 2017-10-11 NOTE — Progress Notes (Signed)
4 Days Post-Op Procedure(s) (LRB): CORONARY ARTERY BYPASS GRAFTING (CABG) ON PUMP TIMES 5 USING LEFT INTERNAL MAMMARY ARTERY AND BILATERAL GREATER SAPHENOUS VEIN VIA ENDOHARDEST. (N/A) TRANSESOPHAGEAL ECHOCARDIOGRAM (TEE) (N/A) Subjective: Waking up at 3 am but otherwise feels ok. Ambulated this am.  Objective: Vital signs in last 24 hours: Temp:  [97.6 F (36.4 C)-98.8 F (37.1 C)] 98.1 F (36.7 C) (03/25 0739) Pulse Rate:  [76-119] 87 (03/25 0600) Cardiac Rhythm: Heart block (03/25 0000) Resp:  [14-22] 18 (03/25 0600) BP: (93-145)/(66-88) 145/83 (03/25 0600) SpO2:  [95 %-100 %] 100 % (03/25 0600) Weight:  [80.9 kg (178 lb 5.6 oz)] 80.9 kg (178 lb 5.6 oz) (03/25 0400)  Hemodynamic parameters for last 24 hours:    Intake/Output from previous day: 03/24 0701 - 03/25 0700 In: 1020 [P.O.:1020] Out: 400 [Urine:400] Intake/Output this shift: No intake/output data recorded.  General appearance: alert and cooperative Neurologic: left sided weakness and stable, unchanged from preop Heart: regular rate and rhythm, S1, S2 normal, no murmur, click, rub or gallop Lungs: clear to auscultation bilaterally Extremities: edema minimal Wound: incisions ok  Lab Results: Recent Labs    10/10/17 0322 10/11/17 0257  WBC 11.6* 9.6  HGB 8.8* 7.8*  HCT 27.3* 23.8*  PLT 177 194   BMET:  Recent Labs    10/10/17 0322 10/11/17 0257  NA 138 137  K 4.2 3.7  CL 104 105  CO2 24 23  GLUCOSE 111* 87  BUN 20 22*  CREATININE 1.39* 1.46*  CALCIUM 8.4* 8.1*    PT/INR: No results for input(s): LABPROT, INR in the last 72 hours. ABG    Component Value Date/Time   PHART 7.436 10/07/2017 1919   HCO3 25.0 10/07/2017 1919   TCO2 26 10/08/2017 1632   ACIDBASEDEF 4.0 (H) 10/07/2017 1842   O2SAT 99.0 10/07/2017 1919   CBG (last 3)  Recent Labs    10/10/17 1719 10/10/17 2118 10/11/17 0736  GLUCAP 113* 105* 89    Assessment/Plan: S/P Procedure(s) (LRB): CORONARY ARTERY BYPASS GRAFTING  (CABG) ON PUMP TIMES 5 USING LEFT INTERNAL MAMMARY ARTERY AND BILATERAL GREATER SAPHENOUS VEIN VIA ENDOHARDEST. (N/A) TRANSESOPHAGEAL ECHOCARDIOGRAM (TEE) (N/A)  Hemodynamically stable in sinus rhythm. Continue Coreg. Hold off on Cozaar with elevated creatinine. Mild volume excess: continue diuretic and K+ for a couple more days. Repeat labs tomorrow. Expected postop blood loss anemia: start iron. DM: glucose under good control. Stop CBG's and SSI. Prior stroke with left sided weakness. Continue PT, ambulation IS Transfer to 4E. Will need SNF at discharge.   LOS: 5 days    Gaye Pollack 10/11/2017

## 2017-10-11 NOTE — Progress Notes (Deleted)
Patients family member notified RN that patient is unable to locate her cell phone. RN went into patient room and looked throughout room attempting to locate item and was unsuccessful. Patient received bath earlier in the day and phone could have possibly went in the dirty linen.  Unit manager notified of incident. Attempted to reach the linen department. Oncoming RN notified. Will continue to search for phone.

## 2017-10-11 NOTE — Progress Notes (Signed)
Pt transferred to 4E10 via wheelchair. Report given to [employer] Vitals per flow sheet.

## 2017-10-12 LAB — BASIC METABOLIC PANEL
Anion gap: 12 (ref 5–15)
BUN: 21 mg/dL — AB (ref 6–20)
CALCIUM: 8.6 mg/dL — AB (ref 8.9–10.3)
CO2: 23 mmol/L (ref 22–32)
CREATININE: 1.55 mg/dL — AB (ref 0.44–1.00)
Chloride: 103 mmol/L (ref 101–111)
GFR calc Af Amer: 43 mL/min — ABNORMAL LOW (ref >60)
GFR, EST NON AFRICAN AMERICAN: 37 mL/min — AB (ref >60)
GLUCOSE: 76 mg/dL (ref 65–99)
POTASSIUM: 4.3 mmol/L (ref 3.5–5.1)
SODIUM: 138 mmol/L (ref 135–145)

## 2017-10-12 LAB — CBC
HEMATOCRIT: 24 % — AB (ref 36.0–46.0)
Hemoglobin: 7.9 g/dL — ABNORMAL LOW (ref 12.0–15.0)
MCH: 30 pg (ref 26.0–34.0)
MCHC: 32.9 g/dL (ref 30.0–36.0)
MCV: 91.3 fL (ref 78.0–100.0)
PLATELETS: 245 10*3/uL (ref 150–400)
RBC: 2.63 MIL/uL — ABNORMAL LOW (ref 3.87–5.11)
RDW: 15.3 % (ref 11.5–15.5)
WBC: 9.4 10*3/uL (ref 4.0–10.5)

## 2017-10-12 NOTE — Progress Notes (Signed)
Physical Therapy Treatment Patient Details Name: Karen Ochoa MRN: [id number] DOB: 05/04/1966 Today's Date: 10/12/2017    History of Present Illness Pt is a 52 y.o. female admitted 10/05/17 for heart cath showing severe multivessel disease. 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 PMH includes CVA (06/2017 residual L-side weakness), HTN, asthma.    PT Comments    Pt performed gait training with RW and she remains too unsteady in standing to use cane.  Pt used cane PTA and would benefit from short term SNF placement to return to baseline.  Pt is not safe to return to home environment as she is homeless and still presents with balance, safety and strength deficits.  Pt unable to recall sternal precautions and required moderate cueing to avoid breaking sternal precautions.  Plan next session to re-educated on sternal precautions and continue to practice transfers and gait to tolerance.    Follow Up Recommendations  SNF;Supervision for mobility/OOB     Equipment Recommendations       Recommendations for Other Services       Precautions / Restrictions Precautions Precautions: Sternal;Fall Precaution Booklet Issued: No Precaution Comments: Verbally reviewed precautions (handout provided by cardiac rehab) Restrictions Weight Bearing Restrictions: No Other Position/Activity Restrictions: Sternal    Mobility  Bed Mobility Overal bed mobility: Needs Assistance Bed Mobility: Rolling;Sidelying to Sit Rolling: Min guard         General bed mobility comments: Educ on log roll technique to maintain sternal precautions. MinA to assist trunk elevation and cues to decrease pushing with BUEs  Transfers Overall transfer level: Needs assistance Equipment used: Rolling walker (2 wheeled) Transfers: Sit to/from Stand Sit to Stand: Min guard         General transfer comment: Cues for technique standing with bilat hands on knees; required min guard A for stability.    Ambulation/Gait Ambulation/Gait  assistance: Min guard Ambulation Distance (Feet): 200 Feet Assistive device: Rolling walker (2 wheeled) Gait Pattern/deviations: Step-through pattern;Decreased stride length;Drifts right/left Gait velocity: Decreased   General Gait Details: Pt unable to attempt with cane due to unsteadiness.  Pt required RW for gait training with cues for safety and use for balance.  Pt required adjustment of RW to fit her height.  Cues for sternal precautions   Stairs            Wheelchair Mobility    Modified Rankin (Stroke Patients Only)       Balance             Standing balance-Leahy Scale: Poor Standing balance comment: without UE support.                            Cognition Arousal/Alertness: Awake/alert Behavior During Therapy: WFL for tasks assessed/performed Overall Cognitive Status: No family/caregiver present to determine baseline cognitive functioning                                 General Comments: Appears appropriate during session this afternoon.  Pt able to recall incident when she thought she experienced some hospital delirium.       Exercises      General Comments        Pertinent Vitals/Pain Pain Assessment: Faces Faces Pain Scale: Hurts a little bit Pain Location: Incision site Pain Descriptors / Indicators: Sore;Guarding Pain Intervention(s): Monitored during session;Repositioned    Home Living  Prior Function            PT Goals (current goals can now be found in the care plan section) Acute Rehab PT Goals Patient Stated Goal: Get stronger at rehab; figure out plans for support once leaving SNF Potential to Achieve Goals: Fair Progress towards PT goals: Progressing toward goals    Frequency    Min 2X/week      PT Plan Current plan remains appropriate    Co-evaluation              AM-PAC PT "6 Clicks" Daily Activity  Outcome Measure  Difficulty turning over in bed  (including adjusting bedclothes, sheets and blankets)?: A Little Difficulty moving from lying on back to sitting on the side of the bed? : Unable Difficulty sitting down on and standing up from a chair with arms (e.g., wheelchair, bedside commode, etc,.)?: Unable Help needed moving to and from a bed to chair (including a wheelchair)?: A Little Help needed walking in hospital room?: A Little Help needed climbing 3-5 steps with a railing? : A Little 6 Click Score: 14    End of Session Equipment Utilized During Treatment: Gait belt Activity Tolerance: Patient tolerated treatment well;Patient limited by fatigue Patient left: in chair;with call bell/phone within reach;with family/visitor present Nurse Communication: Mobility status PT Visit Diagnosis: Other abnormalities of gait and mobility (R26.89)     Time: 4128-7867 PT Time Calculation (min) (ACUTE ONLY): 11 min  Charges:  $Gait Training: 8-22 mins                    G Codes:       Governor Rooks, PTA pager [place] 10/12/2017, 5:07 PM

## 2017-10-12 NOTE — Progress Notes (Signed)
CARDIAC REHAB PHASE I   PRE:  Rate/Rhythm: 85 SR    BP: sitting 102/89    SaO2: 98 RA  MODE:  Ambulation: 240 ft   POST:  Rate/Rhythm: 108 ST    BP: sitting 122/85     SaO2: 97 RA  Pt in bed, willing to walk. Slightly used arms to get to EOB but stayed in the"tube". Stood independently and used RW with gait belt assist in hall.  Pt struggled with steering RW. She veered left and right, hugged the wall, hit the wheel on obstacles. She also struggled maintaining a controlled pace with her walk. Max support with gait belt at times. She was probably better with the EVA but can't take that home. Will allow PT to eval what AD is best for her. Return to bed. Encouraged her to walk x2 more and sit in recliner later. Practiced IS, 500 mL. Pt is improving. [id number]  [place], ACSM 10/12/2017 12:00 PM

## 2017-10-12 NOTE — Progress Notes (Addendum)
      [address]       [place] [address]             [phone]      5 Days Post-Op Procedure(s) (LRB): CORONARY ARTERY BYPASS GRAFTING (CABG) ON PUMP TIMES 5 USING LEFT INTERNAL MAMMARY ARTERY AND BILATERAL GREATER SAPHENOUS VEIN VIA ENDOHARDEST. (N/A) TRANSESOPHAGEAL ECHOCARDIOGRAM (TEE) (N/A) Subjective: She is having surgical site pain.   Objective: Vital signs in last 24 hours: Temp:  [97.4 F (36.3 C)-98.3 F (36.8 C)] 97.4 F (36.3 C) (03/26 0354) Pulse Rate:  [81-89] 84 (03/26 0354) Cardiac Rhythm: Normal sinus rhythm (03/25 2240) Resp:  [14-19] 18 (03/26 0354) BP: (89-163)/(70-101) 163/93 (03/26 0354) SpO2:  [94 %-100 %] 96 % (03/26 0354) Weight:  [173 lb (78.5 kg)] 173 lb (78.5 kg) (03/26 0354)     Intake/Output from previous day: 03/25 0701 - 03/26 0700 In: 840 [P.O.:840] Out: 825 [Urine:825] Intake/Output this shift: No intake/output data recorded.  General appearance: alert, cooperative and no distress Heart: regular rate and rhythm, S1, S2 normal, no murmur, click, rub or gallop Lungs: clear to auscultation bilaterally Abdomen: soft, non-tender; bowel sounds normal; no masses,  no organomegaly Extremities: extremities normal, atraumatic, no cyanosis or edema Wound: clean and dry  Lab Results: Recent Labs    10/11/17 0257 10/12/17 0336  WBC 9.6 9.4  HGB 7.8* 7.9*  HCT 23.8* 24.0*  PLT 194 245   BMET:  Recent Labs    10/11/17 0257 10/12/17 0336  NA 137 138  K 3.7 4.3  CL 105 103  CO2 23 23  GLUCOSE 87 76  BUN 22* 21*  CREATININE 1.46* 1.55*  CALCIUM 8.1* 8.6*    PT/INR: No results for input(s): LABPROT, INR in the last 72 hours. ABG    Component Value Date/Time   PHART 7.436 10/07/2017 1919   HCO3 25.0 10/07/2017 1919   TCO2 26 10/08/2017 1632   ACIDBASEDEF 4.0 (H) 10/07/2017 1842   O2SAT 99.0 10/07/2017 1919   CBG (last 3)  Recent Labs    10/10/17 1719 10/10/17 2118 10/11/17 0736  GLUCAP 113* 105* 89      Assessment/Plan: S/P Procedure(s) (LRB): CORONARY ARTERY BYPASS GRAFTING (CABG) ON PUMP TIMES 5 USING LEFT INTERNAL MAMMARY ARTERY AND BILATERAL GREATER SAPHENOUS VEIN VIA ENDOHARDEST. (N/A) TRANSESOPHAGEAL ECHOCARDIOGRAM (TEE) (N/A)  1. CV-NSR in the 80s-90s. BP mostly controlled but high this morning. Will trend. Tolerating Coreg, lipitor, and ASA. She was on Cozaar at home however would not restart due to AKI.  2. Pulm-tolerating room air with good oxygen saturation. Continue incentive spirometer.  3. Renal-creatinine 1.55, slightly elevated from yesterday. Electrolytes okay. Almost at baseline weight. Continues to trend down. 4. Trend H and H 7.9/24.0 this morning.  5. Endo-blood glucose well controlled 6. Previous stroke. On going confusion, delirium w/ history of alcohol abuse. Does not appear to need withdrawal protocol.   Plan: May need an additional agent like Norvasc for BP. Work on pain control today. Encouraged incentive spirometer. Ambulate in the halls today.     LOS: 6 days    Elgie Collard 10/12/2017   Chart reviewed, patient examined, agree with above.

## 2017-10-13 MED ORDER — AMLODIPINE BESYLATE 10 MG PO TABS
10.0000 mg | ORAL_TABLET | Freq: Every day | ORAL | Status: DC
  Administered 2017-10-13 – 2017-10-16 (×4): 10 mg via ORAL
  Filled 2017-10-13 (×4): qty 1

## 2017-10-13 MED FILL — Magnesium Sulfate Inj 50%: INTRAMUSCULAR | Qty: 10 | Status: AC

## 2017-10-13 MED FILL — Sodium Chloride IV Soln 0.9%: INTRAVENOUS | Qty: 2000 | Status: AC

## 2017-10-13 MED FILL — Electrolyte-R (PH 7.4) Solution: INTRAVENOUS | Qty: 4000 | Status: AC

## 2017-10-13 MED FILL — Heparin Sodium (Porcine) Inj 1000 Unit/ML: INTRAMUSCULAR | Qty: 30 | Status: AC

## 2017-10-13 MED FILL — Heparin Sodium (Porcine) Inj 1000 Unit/ML: INTRAMUSCULAR | Qty: 10 | Status: AC

## 2017-10-13 MED FILL — Sodium Bicarbonate IV Soln 8.4%: INTRAVENOUS | Qty: 50 | Status: AC

## 2017-10-13 MED FILL — Potassium Chloride Inj 2 mEq/ML: INTRAVENOUS | Qty: 40 | Status: AC

## 2017-10-13 MED FILL — Lidocaine HCl IV Inj 20 MG/ML: INTRAVENOUS | Qty: 5 | Status: AC

## 2017-10-13 NOTE — Progress Notes (Signed)
      [address]       [place] [address]             [phone]      6 Days Post-Op Procedure(s) (LRB): CORONARY ARTERY BYPASS GRAFTING (CABG) ON PUMP TIMES 5 USING LEFT INTERNAL MAMMARY ARTERY AND BILATERAL GREATER SAPHENOUS VEIN VIA ENDOHARDEST. (N/A) TRANSESOPHAGEAL ECHOCARDIOGRAM (TEE) (N/A) Subjective: Feels okay this morning. Was walking in the halls earlier.   Objective: Vital signs in last 24 hours: Temp:  [97.5 F (36.4 C)-98.6 F (37 C)] 98.6 F (37 C) (03/27 0751) Pulse Rate:  [84-95] 92 (03/27 0751) Cardiac Rhythm: Normal sinus rhythm (03/27 0700) Resp:  [16-20] 18 (03/27 0751) BP: (123-162)/(85-106) 157/102 (03/27 0751) SpO2:  [97 %-100 %] 97 % (03/27 0751) Weight:  [172 lb 2.9 oz (78.1 kg)] 172 lb 2.9 oz (78.1 kg) (03/27 0300)     Intake/Output from previous day: 03/26 0701 - 03/27 0700 In: 720 [P.O.:720] Out: 700 [Urine:700] Intake/Output this shift: No intake/output data recorded.  General appearance: alert, cooperative and no distress Heart: regular rate and rhythm, S1, S2 normal, no murmur, click, rub or gallop Lungs: clear to auscultation bilaterally Abdomen: soft, non-tender; bowel sounds normal; no masses,  no organomegaly Extremities: extremities normal, atraumatic, no cyanosis or edema Wound: clean and dry  Lab Results: Recent Labs    10/11/17 0257 10/12/17 0336  WBC 9.6 9.4  HGB 7.8* 7.9*  HCT 23.8* 24.0*  PLT 194 245   BMET:  Recent Labs    10/11/17 0257 10/12/17 0336  NA 137 138  K 3.7 4.3  CL 105 103  CO2 23 23  GLUCOSE 87 76  BUN 22* 21*  CREATININE 1.46* 1.55*  CALCIUM 8.1* 8.6*    PT/INR: No results for input(s): LABPROT, INR in the last 72 hours. ABG    Component Value Date/Time   PHART 7.436 10/07/2017 1919   HCO3 25.0 10/07/2017 1919   TCO2 26 10/08/2017 1632   ACIDBASEDEF 4.0 (H) 10/07/2017 1842   O2SAT 99.0 10/07/2017 1919   CBG (last 3)  Recent Labs    10/10/17 1719 10/10/17 2118  10/11/17 0736  GLUCAP 113* 105* 89    Assessment/Plan: S/P Procedure(s) (LRB): CORONARY ARTERY BYPASS GRAFTING (CABG) ON PUMP TIMES 5 USING LEFT INTERNAL MAMMARY ARTERY AND BILATERAL GREATER SAPHENOUS VEIN VIA ENDOHARDEST. (N/A) TRANSESOPHAGEAL ECHOCARDIOGRAM (TEE) (N/A)  1. CV-NSR in the 90s. OK to remove EPW since her rhythm has been stable. BP mostly controlled but high this morning 157/102. Started Norvasc for HTN. Tolerating Coreg, lipitor, and ASA. She was on Cozaar at home however would not restart due to AKI.  2. Pulm-tolerating room air with good oxygen saturation. Continue incentive spirometer.  3. Renal-creatinine 1.55. Marland Kitchen Electrolytes okay. Almost at baseline weight. Continues to trend down. 4. Trend H and H 7.9/24.0. No symptoms.  5. Endo-blood glucose well controlled 6. Previous stroke. On going confusion, delirium w/ history of alcohol abuse. Does not appear to need withdrawal protocol. Asking to see Dr. Erlinda Hong. In basket Message was sent.   Plan: Remove EPW. Continue to ambulate in the halls. Continue use of incentive spirometer. Possibly to SNF  in few days.      LOS: 7 days    Elgie Collard 10/13/2017

## 2017-10-13 NOTE — Telephone Encounter (Signed)
Patient wanted to let Dr. Erlinda Hong know she had open heart surgery last Thursday and is still in the hospital. She would like Dr. Erlinda Hong to come see her in the hospital if possible. She is at [facility] and can be reached on her cell phone at [phone].

## 2017-10-13 NOTE — Telephone Encounter (Signed)
Dr.Xu see message below. FYI.

## 2017-10-13 NOTE — Progress Notes (Addendum)
Pacing wires removed per order. Patient tolerated well. Patient instructed to stay in bed for an hour to be monitored. Betadine applied to incision sites at this time.   Emelda Fear, RN

## 2017-10-13 NOTE — Progress Notes (Signed)
Clinical Social Worker following patient for discharge needs. CSW met patient at bedside to discuss the possibility of going to a SNF. Patient sated she is agreeable to discharge to rehab. CSW stated to patient she will need to reach out to Surveyor, quantity of Social Work to attain an [place] which will allow patient to discharge to SNF.     Rhea Pink, MSW,  [place]

## 2017-10-13 NOTE — Progress Notes (Signed)
CARDIAC REHAB PHASE I   PRE:  Rate/Rhythm: 85 SR    BP: sitting 120/93    SaO2: 94 RA  MODE:  Ambulation: 440 ft   POST:  Rate/Rhythm: 112 ST    BP: sitting 122/86     SaO2: 98 RA  Pt moved to EOB following "Move in the Tube" guidelines. Stood independently. She used rollator today which she had better control with. Steady, able to stand tall. We did choose the hallway with less obstacles. She was able to increase her distance. Tired after walk, no rest needed. To recliner. Practiced IS, can only do 500 mL x2-3. Encouraged more work every 15 min. She will walk with RN later. [place], ACSM 10/13/2017 2:22 PM

## 2017-10-13 NOTE — Discharge Summary (Signed)
Physician Discharge Summary  Patient ID: Karen Ochoa MRN: [id number] DOB/AGE: [birth year] 54 y.o.  Admit date: 10/05/2017 Discharge date: 10/15/2017  Admission Diagnoses:  Patient Active Problem List   Diagnosis Date Noted  . CAD (coronary artery disease) 10/05/2017  . 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 Chronic renal insufficiency, stage III (moderate) ([place]) 07/21/2017  . Carotid artery disease ([place]) 07/21/2017  . Chronic systolic CHF (congestive heart failure) ([place])   . White matter abnormality on MRI of brain   . Cerebrovascular accident (CVA) ([place]) 07/19/2017  . Renal insufficiency 07/19/2017  . Smoker 06/18/2009  . History of syncope 2010 06/18/2009  . INSOMNIA, CHRONIC 05/27/2009  . HTN (hypertension) 05/27/2009  . History of TIAs 05/27/2009  . HEADACHE, CHRONIC, HX OF 05/27/2009    Discharge Diagnoses:  Active Problems:   HTN (hypertension)   History of TIAs   Cardiomyopathy- etioloogy not yet determined, suspect HTN CM   Carotid artery disease (HCC)   Coronary artery disease of native artery of native heart with stable angina pectoris (HCC)   CAD (coronary artery disease)   Coronary artery disease   S/P CABG x 5   Discharged Condition: good  HPI:  The patient is a 52 year old African-American female with a history of poorly controlled hypertension, hypercholesterolemia, multiple TIAs in the past and recent stroke on 07/18/2017 associated with severe high blood pressure.  She presented with left upper extremity and left lower extremity numbness and weakness and ruled in for a right pontine stroke.  Blood pressure was very high on admission due to medication noncompliance.  She had an echocardiogram done which showed an ejection fraction of 30-35% with diffuse hypokinesis.  Telemetry shows sinus rhythm with no arrhythmias.  Carotid Dopplers showed no stenosis bilaterally.  She was seen by neurology and started on aspirin and statin.   Blood pressure medications were restarted and adjusted to control blood pressure.  Cardiology was consulted for her systolic congestive heart failure and she was started on Coreg and losartan.  Cardiac catheterization was recommended but was delayed to allow her to recover from her stroke.  She reportedly stopped smoking for a while but then restarted.  When she was seen back by the cardiology PA she was reporting some substernal chest discomfort particularly occurring at night.  There is some associated shortness of breath.  She said she did not have any symptoms during the day when she was up moving around although she has not been very active since her stroke.  She underwent cardiac catheterization today showing severe three-vessel coronary disease with overall normal left ventricular ejection fraction of 55%.  LVEDP was normal with a wedge pressure of 24 and normal pulmonary artery pressures.  She is single and has been living on her cousins couch since she is essentially homeless.  She has been trying to get disability since her stroke.  She was previously working at the [employer] at a temporary job.    Hospital Course:   On October 07, 2017 Ms. Goods underwent a coronary bypass grafting x5 with Dr. Cyndia Bent.  She tolerated the procedure well and was transferred to the surgical ICU.  She was extubated in a timely manner.  Postop day 1 she remained hemodynamically stable in normal sinus rhythm.  She has had poorly controlled hypertension and we initiated her Coreg.  We held off starting Cozaar due to her mildly elevated creatinine.  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  She has some residual weakness but is ambulatory.  We consulted physical therapy to work with her.  We discontinued her chest tubes, Swan-Ganz catheter, and arterial line.  We consulted case management for skilled nursing facility placement at discharge since she is homeless.  Postop day 2 she  was breathing comfortably on 2 L nasal cannula of oxygen support.  She did have some postoperative acute blood loss anemia which was mild.  She did have some expected volume excess and her urine output was adequate.  We initiated a diuretic regimen at this time. Postop day 3 she continued to progress.  She did have some confusion and delirium with a history of alcohol abuse.  We did not think it was necessary to start an alcohol withdrawal protocol.  Postop day 4 she continued to progress.  She was mildly fluid overloaded therefore we continued a diuretic regimen and potassium replacement.  Her blood glucose levels well controlled therefore we were able to stop CBGs and sliding scale insulin coverage.  Physical therapy continued to work with her on a daily basis.She was stable to transfer to the stepdown unit for continued care.  Postop day 5 we worked on pain control.  Her creatinine remained elevated therefore we continue to hold her Cozaar.  We trended her hemoglobin and hematocrit.  She continued to ambulate in the halls 3 times a day.  Postop day 6 she remained in normal sinus rhythm and hemodynamically stable.  We removed her epicardial pacing wires.  We started Norvasc for better hypertension control.  We re-consulted case management for skilled nursing facility placement since she was getting close to discharge.  Today, she is ambulating with limited assistance, her incisions are healing well, she is tolerating room air with good oxygenation, and she is ready for discharge to SNF.    Consults: None  Significant Diagnostic Studies:  CLINICAL DATA:  Chest pain and shortness of Breath  EXAM: CHEST - 2 VIEW  COMPARISON:  10/09/2017  FINDINGS: Cardiac shadow is mildly enlarged. Postsurgical changes are noted. The lungs are well aerated bilaterally. Small bilateral pleural effusions are noted. No significant atelectatic changes are noted.  IMPRESSION: Small effusions  bilaterally.   Electronically Signed   By: Inez Catalina M.D.   On: 10/10/2017 18:27    Treatments:    CARDIOVASCULAR SURGERY OPERATIVE NOTE  10/07/2017  Surgeon:  Gaye Pollack, MD  First Assistant: Nicholes Rough,  PA-C   Preoperative Diagnosis:  Severe multi-vessel coronary artery disease   Postoperative Diagnosis:  Same   Procedure:  1. Median Sternotomy 2. Extracorporeal circulation 3.   Coronary artery bypass grafting x 5   Left internal mammary graft to the LAD  SVG to diagonal  SVG to OM1  SVG to OM2  SVG to RCA 4.   Endoscopic vein harvest from the right and left legs   Anesthesia:  General Endotracheal   Discharge Exam: Blood pressure 115/81, pulse 92, temperature 97.9 F (36.6 C), temperature source Oral, resp. rate 18, height 5\' 7"  (1.702 m), weight 171 lb 4.8 oz (77.7 kg), last menstrual period 05/19/2013, SpO2 96 %.   General appearance: alert, cooperative and no distress Heart: regular rate and rhythm, S1, S2 normal, no murmur, click, rub or gallop Lungs: clear to auscultation bilaterally Abdomen: soft, non-tender; bowel sounds normal; no masses,  no organomegaly Extremities: extremities normal, atraumatic, no cyanosis or edema Wound: clean and dry    Disposition:   Discharge Instructions  Amb Referral to Cardiac Rehabilitation   Complete by:  As directed    Diagnosis:  CABG   CABG X ___:  5     Allergies as of 10/15/2017      Reactions   Penicillins Anaphylaxis, Other (See Comments)   Has patient had a PCN reaction causing immediate rash, facial/tongue/throat swelling, SOB or lightheadedness with hypotension: Yes Has patient had a PCN reaction causing severe rash involving mucus membranes or skin necrosis: Yes Has patient had a PCN reaction that required hospitalization Yes Has patient had a PCN reaction occurring within the last 10 years: No If all of the above answers are "NO", then may proceed with  Cephalosporin use.   Sulfonamide Derivatives Anaphylaxis   Clonidine Derivatives Other (See Comments)   Patient passed out shortly after ingesting this at a doctor's office on 05/08/16 (was taken in conjunction with a tablet of Labetalol.)   Flagyl [metronidazole] Swelling, Other (See Comments)   Skin blisters   Labetalol Other (See Comments)   Patient passed out shortly after ingesting this at a doctor's office on 05/08/16 (was taken in conjunction with a tablet of Clonidine.)      Medication List    STOP taking these medications   buPROPion 150 MG 12 hr tablet Commonly known as:  WELLBUTRIN SR   diphenhydramine-acetaminophen 25-500 MG Tabs tablet Commonly known as:  TYLENOL PM   furosemide 20 MG tablet Commonly known as:  LASIX   losartan 25 MG tablet Commonly known as:  COZAAR     TAKE these medications   acetaminophen 325 MG tablet Commonly known as:  TYLENOL Take 2 tablets (650 mg total) by mouth every 6 (six) hours as needed for mild pain.   amLODipine 10 MG tablet Commonly known as:  NORVASC Take 1 tablet (10 mg total) by mouth daily. Start taking on:  10/16/2017   aspirin 325 MG tablet Take 1 tablet (325 mg total) by mouth daily.   atorvastatin 80 MG tablet Commonly known as:  LIPITOR Take 1 tablet (80 mg total) by mouth daily at 6 PM.   bisacodyl 5 MG EC tablet Commonly known as:  DULCOLAX Take 1 tablet (5 mg total) by mouth daily as needed for moderate constipation.   carvedilol 25 MG tablet Commonly known as:  COREG Take 1 tablet (25 mg total) by mouth 2 (two) times daily with a meal. What changed:  See the new instructions.   ferrous [id number] C-folic acid capsule Commonly known as:  TRINSICON / FOLTRIN Take 1 capsule by mouth 3 (three) times daily after meals.   oxyCODONE 5 MG immediate release tablet Commonly known as:  Oxy IR/ROXICODONE Take 1 tablet (5 mg total) by mouth every 6 (six) hours as needed for severe pain.   traZODone 50  MG tablet Commonly known as:  DESYREL Take 0.5 tablets (25 mg total) by mouth at bedtime as needed for sleep.   VENTOLIN HFA 108 (90 Base) MCG/ACT inhaler Generic drug:  albuterol INHALE 2 PUFFS INTO THE LUNGS EVERY 6 (SIX) HOURS AS NEEDED FOR WHEEZING OR SHORTNESS OF BREATH.       Contact information for follow-up providers    [name], FNP. Call in 1 day(s).   Specialty:  Family Medicine Contact information: [address]. [place] [address] [phone]        Gaye Pollack, MD Follow up.   Specialty:  Cardiothoracic Surgery Why:  Your follow-up appointment is on Nov 17, 2016 at  12:00 noon.  Please arrive at 11:30 AM for a chest x-ray at [facility] imaging which is located on the first floor of our building. Contact information: [address]  [place] [address] [phone]        Nursing appointment for chest tube suture removal Follow up.   Why:  Your chest tube suture removal appointment is on October 20, 2016 at 10 a.m. Contact information: Dr. Vivi Martens office       Imogene Burn, PA-C Follow up.   Specialty:  Cardiology Why:  Your appointment is on 10/27/2017 at 11:00am. Please bring your medication list.  Contact information: [place] [address] [phone]            Contact information for after-discharge care    [facility] .   Service:  Skilled Nursing Contact information: [place] [phone]                  1. Please obtain vital signs at least one time daily 2.  Please weigh the patient daily. If he or she continues to gain weight or develops lower extremity edema, contact the office at [phone]. 3. Ambulate patient at least three times daily and please use sternal precautions. 4. Wash incisions every day with soap and water. Dry with a clean towel.    The patient has  been discharged on:   1.Beta Blocker:  Yes [ x  ]                              No   [   ]                              If No, reason:  2.Ace Inhibitor/ARB: Yes [   ]                                     No  [ x   ]                                     If No, reason: AKI  3.Statin:   Yes [  x ]                  No  [   ]                  If No, reason:  4.Ecasa:  Yes  [ x  ]                  No   [   ]                  If No, reason:    Signed: Elgie Collard 10/15/2017, 4:20 PM

## 2017-10-13 NOTE — Telephone Encounter (Signed)
Rn call patient back about wanting Dr.Xu to see her while she is in the hospital. Rn stated Dr. Erlinda Hong only sees stroke patients in the hospital,not cardiac patients. Pt stated the cardiac procedure is because of the stroke. RN stated there is no guarantee he can come by to see her. RN stated a message can be sent. Pt stated just let him know.

## 2017-10-13 NOTE — [place] Notes (Signed)
1000 Offered to walk with pt. She stated she just walked with staff.  Will walk with [place] after lunch she stated. Will follow up. Graylon Good RN BSN 10/13/2017 10:14 AM

## 2017-10-13 NOTE — Care Management Note (Signed)
Case Management Note Marvetta Gibbons RN, BSN Unit 4E-Case Manager [phone]  Patient Details  Name: Karen Ochoa MRN: [id number] Date of Birth: [birth year]  Subjective/Objective:   Pt admitted with HTN, CAD, s/p CABG2, hx of CVA in 12/18 with left sided weakness- pt also has hx of ETOH use                Action/Plan: PTA Pt was staying on her cousins couch, this in not an option at discharge- and pt is homeless at this time. CSW following for transition needs- will look at trying to get approval for LOG SNF. Per PT notes pt would not be safe to return to a home environment at this time/confused at times. CM and CSW will continue to follow for transition of care needs.    Expected Discharge Date:                  Expected Discharge Plan:  Skilled Nursing Facility  In-House Referral:  Clinical Social Work  Discharge planning Services  CM Consult  Post Acute Care Choice:    Choice offered to:     DME Arranged:    DME Agency:     HH Arranged:    [place] Agency:     Status of Service:  In process, will continue to follow  If discussed at Long Length of Stay Meetings, dates discussed:    Discharge Disposition:   Additional Comments:  Dawayne Patricia, RN 10/13/2017, 3:30 PM

## 2017-10-13 NOTE — Discharge Instructions (Signed)

## 2017-10-14 LAB — BASIC METABOLIC PANEL
Anion gap: 10 (ref 5–15)
BUN: 20 mg/dL (ref 6–20)
CALCIUM: 8.6 mg/dL — AB (ref 8.9–10.3)
CO2: 24 mmol/L (ref 22–32)
CREATININE: 1.36 mg/dL — AB (ref 0.44–1.00)
Chloride: 103 mmol/L (ref 101–111)
GFR calc non Af Amer: 43 mL/min — ABNORMAL LOW (ref >60)
GFR, EST AFRICAN AMERICAN: 50 mL/min — AB (ref >60)
Glucose, Bld: 115 mg/dL — ABNORMAL HIGH (ref 65–99)
Potassium: 4.3 mmol/L (ref 3.5–5.1)
SODIUM: 137 mmol/L (ref 135–145)

## 2017-10-14 LAB — CBC
HCT: 26.2 % — ABNORMAL LOW (ref 36.0–46.0)
Hemoglobin: 8.3 g/dL — ABNORMAL LOW (ref 12.0–15.0)
MCH: 28.8 pg (ref 26.0–34.0)
MCHC: 31.7 g/dL (ref 30.0–36.0)
MCV: 91 fL (ref 78.0–100.0)
Platelets: 321 10*3/uL (ref 150–400)
RBC: 2.88 MIL/uL — AB (ref 3.87–5.11)
RDW: 14.9 % (ref 11.5–15.5)
WBC: 9.3 10*3/uL (ref 4.0–10.5)

## 2017-10-14 LAB — GLUCOSE, CAPILLARY: GLUCOSE-CAPILLARY: 116 mg/dL — AB (ref 65–99)

## 2017-10-14 MED ORDER — POLYETHYLENE GLYCOL 3350 17 G PO PACK
17.0000 g | PACK | Freq: Every day | ORAL | Status: DC
  Administered 2017-10-14 – 2017-10-16 (×3): 17 g via ORAL
  Filled 2017-10-14 (×3): qty 1

## 2017-10-14 NOTE — [place] Notes (Addendum)
      [address]       [place] [address]             [phone]      7 Days Post-Op Procedure(s) (LRB): CORONARY ARTERY BYPASS GRAFTING (CABG) ON PUMP TIMES 5 USING LEFT INTERNAL MAMMARY ARTERY AND BILATERAL GREATER SAPHENOUS VEIN VIA ENDOHARDEST. (N/A) TRANSESOPHAGEAL ECHOCARDIOGRAM (TEE) (N/A) Subjective: Turned on her side in the bed and now having incisional pain.   Objective: Vital signs in last 24 hours: Temp:  [97.9 F (36.6 C)-98.6 F (37 C)] 97.9 F (36.6 C) (03/28 0351) Pulse Rate:  [83-92] 91 (03/28 0351) Cardiac Rhythm: Normal sinus rhythm (03/27 2010) Resp:  [15-19] 16 (03/28 0351) BP: (109-157)/(81-102) 127/85 (03/28 0351) SpO2:  [97 %-100 %] 100 % (03/28 0351) Weight:  [172 lb 4.8 oz (78.2 kg)] 172 lb 4.8 oz (78.2 kg) (03/28 0351)     Intake/Output from previous day: 03/27 0701 - 03/28 0700 In: 120 [P.O.:120] Out: 650 [Urine:650] Intake/Output this shift: No intake/output data recorded.  General appearance: alert, cooperative and no distress Heart: regular rate and rhythm, S1, S2 normal, no murmur, click, rub or gallop Lungs: clear to auscultation bilaterally Abdomen: soft, non-tender; bowel sounds normal; no masses,  no organomegaly Extremities: extremities normal, atraumatic, no cyanosis or edema Wound: clean and dry, stable on palpation.   Lab Results: Recent Labs    10/12/17 0336 10/14/17 0347  WBC 9.4 9.3  HGB 7.9* 8.3*  HCT 24.0* 26.2*  PLT 245 321   BMET:  Recent Labs    10/12/17 0336 10/14/17 0347  NA 138 137  K 4.3 4.3  CL 103 103  CO2 23 24  GLUCOSE 76 115*  BUN 21* 20  CREATININE 1.55* 1.36*  CALCIUM 8.6* 8.6*    PT/INR: No results for input(s): LABPROT, INR in the last 72 hours. ABG    Component Value Date/Time   PHART 7.436 10/07/2017 1919   HCO3 25.0 10/07/2017 1919   TCO2 26 10/08/2017 1632   ACIDBASEDEF 4.0 (H) 10/07/2017 1842   O2SAT 99.0 10/07/2017 1919   CBG (last 3)  No results for  input(s): GLUCAP in the last 72 hours.  Assessment/Plan: S/P Procedure(s) (LRB): CORONARY ARTERY BYPASS GRAFTING (CABG) ON PUMP TIMES 5 USING LEFT INTERNAL MAMMARY ARTERY AND BILATERAL GREATER SAPHENOUS VEIN VIA ENDOHARDEST. (N/A) TRANSESOPHAGEAL ECHOCARDIOGRAM (TEE) (N/A)  1. CV-NSR rate in the 90s. BP well controlled. Tolerating Coreg, Lipitor, ASA, and Norvasc. Holding Cozaar for AKI.  2. Pulm- tolerating room air with good oxygen saturation 3. Renal-creatinine down to 1.36. Electrolytes okay.  4. H and H improving 8.3/26.2 this morning. Platelets now 321k 5. Endo-blood glucose well controlled on current regimen.  6. Previous stroke-working with PT/OT. Not a candidate for CIR due to insurance. Working on SNF placement since the patient is homeless.    Plan: Continue PT/OT for increased mobility. Encouraged incentive spirometer use. Working on discharge plan.    LOS: 8 days    Elgie Collard 10/14/2017   Chart reviewed, patient examined, agree with above. She is doing well overall and ambulating with cardiac rehab without difficulty. Awaiting placement.

## 2017-10-14 NOTE — Telephone Encounter (Signed)
Karen Ochoa will call once she is discharge from hospital thanks.

## 2017-10-14 NOTE — [place] Notes (Addendum)
Clinical Social Worker following patient for disposition needs. Patient was approved for an LOG by Surveyor, quantity of Social Work, he has agreed to place patient into short term rehab. CSW met patient and patients cousin at bedside to go over placement and resources. Patient/family stated they are all in agreement with patient discharging to short term rehab. Patient also asked for resources on low income housing stating that she "wants to get a head start on finding an apartment since she is no longer able to stay with cousin". CSW encouraged patient and family to start that process of assisting patient in finding housing as well as applying for disability at [employer]. CSW explained to family that there is  no member of the [facility] that will do the disability paper work for patient. Patient will need to contact low income housing and social security herself.  CSW gave patients cousins a list of low income apartments in the area. Both cousins stated they will look over the list of low income apartment in the area and go over it with patient. CSW informed patient her family has resources   Rhea Pink, MSW,  [place]

## 2017-10-14 NOTE — Progress Notes (Signed)
   10/14/17 1600  Clinical Encounter Type  Visited With Patient  Visit Type Follow-up  Referral From Nurse  Consult/Referral To Chaplain  Spiritual Encounters  Spiritual Needs Emotional  Stress Factors  Patient Stress Factors Family relationships  Family Stress Factors Major life changes  Follow up with Pt. She is doing better. She was up walking in hall. She states that her family is visiting her now and SW looking for her a rehab facility. She said things are looking better. Chaplain provided spiritual and emotional support. Matthew Folks

## 2017-10-14 NOTE — Telephone Encounter (Signed)
I would like to see her once she recovers from her recent cardiac surgery. Please re-schedule her with me in 4-6 weeks. Thanks much.   Rosalin Hawking, MD PhD Stroke Neurology 10/14/2017 10:20 AM

## 2017-10-14 NOTE — [place] Notes (Signed)
Physical Therapy Treatment Patient Details Name: Karen Ochoa MRN: [id number] DOB: 1965/11/09 Today's Date: 10/14/2017    History of Present Illness Pt is a 52 y.o. female admitted 10/05/17 for heart cath showing severe multivessel disease. 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 PMH includes CVA (06/2017 residual L-side weakness), HTN, asthma.    PT Comments    Pt performed gait training with max VCs for encouragement.  Pt remains to require RW for gait training and was not dependent on a RW PTA.  Pt continues to require cueing to maintain sternal precautions.  From a safety standpoint she is not safe to return home as she is homeless.  Pt at risk for deshiscence of sternal incision due to lack of maintenance of her precautions during functional mobility.  Plan next session for gait with SPC to challenge balance as she continues to improve.    Follow Up Recommendations  SNF;Supervision for mobility/OOB     Equipment Recommendations  (TBD at next venue)    Recommendations for Other Services       Precautions / Restrictions Precautions Precautions: Sternal;Fall Precaution Booklet Issued: No Precaution Comments: Verbally reviewed precautions (handout provided by cardiac rehab) Restrictions Weight Bearing Restrictions: Yes Other Position/Activity Restrictions: Sternal    Mobility  Bed Mobility Overal bed mobility: Needs Assistance Bed Mobility: Rolling;Sidelying to Sit Rolling: Supervision Sidelying to sit: Supervision       General bed mobility comments: Pt able to recall log rolling and achieve sitting while maintaining sternal precautions.  Pt returning to supine required cues to avoid reaching back and pushing.    Transfers Overall transfer level: Needs assistance Equipment used: Rolling walker (2 wheeled) Transfers: Sit to/from Stand Sit to Stand: Min guard         General transfer comment: Cues for technique standing with bilat hands on knees; required min guard A for stability.   Pt attempting to reach back to bed to return to seated surface.    Ambulation/Gait Ambulation/Gait assistance: Min guard Ambulation Distance (Feet): 200 Feet Assistive device: Rolling walker (2 wheeled) Gait Pattern/deviations: Step-through pattern;Decreased stride length;Drifts right/left Gait velocity: Decreased Gait velocity interpretation: Below normal speed for age/gender General Gait Details: Pt remains reliant on RW for support in standing.  Pt required cues for RW safety and drifting to the L with rolling walker.     Stairs            Wheelchair Mobility    Modified Rankin (Stroke Patients Only)       Balance Overall balance assessment: Needs assistance   Sitting balance-Leahy Scale: Fair       Standing balance-Leahy Scale: Poor Standing balance comment: without UE support.                            Cognition Arousal/Alertness: Awake/alert Behavior During Therapy: WFL for tasks assessed/performed;Agitated Overall Cognitive Status: Within Functional Limits for tasks assessed                                 General Comments: Pt remains appropriate during session but reports she feels tired and hasn't been able to get "any rest in this hospital"      Exercises      General Comments        Pertinent Vitals/Pain Pain Assessment: Faces Faces Pain Scale: No hurt    Home Living  Prior Function            PT Goals (current goals can now be found in the care plan section) Acute Rehab PT Goals Patient Stated Goal: Get stronger at rehab; figure out plans for support once leaving SNF PT Goal Formulation: With patient Potential to Achieve Goals: Fair Progress towards PT goals: Progressing toward goals    Frequency    Min 2X/week      PT Plan Current plan remains appropriate    Co-evaluation              AM-PAC PT "6 Clicks" Daily Activity  Outcome Measure  Difficulty turning  over in bed (including adjusting bedclothes, sheets and blankets)?: A Little Difficulty moving from lying on back to sitting on the side of the bed? : Unable Difficulty sitting down on and standing up from a chair with arms (e.g., wheelchair, bedside commode, etc,.)?: Unable Help needed moving to and from a bed to chair (including a wheelchair)?: A Little Help needed walking in hospital room?: A Little Help needed climbing 3-5 steps with a railing? : A Little 6 Click Score: 14    End of Session Equipment Utilized During Treatment: Gait belt Activity Tolerance: Patient tolerated treatment well;Patient limited by fatigue Patient left: in chair;with call bell/phone within reach;with family/visitor present Nurse Communication: Mobility status PT Visit Diagnosis: Other abnormalities of gait and mobility (R26.89)     Time: 6384-5364 PT Time Calculation (min) (ACUTE ONLY): 11 min  Charges:  $Gait Training: 8-22 mins                    G Codes:       Governor Rooks, PTA pager [place] 10/14/2017, 4:42 PM

## 2017-10-14 NOTE — Progress Notes (Signed)
I checked with pt this am and she had walked early morning with RN. I returned after lunch and she just recently walked with RN again. Sts she did well but used regular RW and noted trouble steering. She declined education at this time but did agree to practicing IS. Still at about 500 mL for 3-4 breaths, then begins to decrease. Encouraged her to practice every 15-20 min. Will f/u tomorrow. [place], ACSM 1:59 PM 10/14/2017

## 2017-10-14 NOTE — Progress Notes (Signed)
CCMD called at 0845 to report a 5 beat run of wide complex tachycardia, paged Dr. Cyndia Bent MD.

## 2017-10-15 MED ORDER — CARVEDILOL 25 MG PO TABS: 25.0000 mg | ORAL_TABLET | Freq: Two times a day (BID) | ORAL | 1 refills | Status: DC

## 2017-10-15 MED ORDER — FE FUMARATE-B12-VIT C-FA-IFC PO CAPS: 1.0000 | ORAL_CAPSULE | Freq: Three times a day (TID) | ORAL | 1 refills | Status: DC

## 2017-10-15 MED ORDER — OXYCODONE HCL 5 MG PO TABS: 5.0000 mg | ORAL_TABLET | Freq: Four times a day (QID) | ORAL | 0 refills | Status: DC | PRN

## 2017-10-15 MED ORDER — ATORVASTATIN CALCIUM 80 MG PO TABS: 80.0000 mg | ORAL_TABLET | Freq: Every day | ORAL | 1 refills | Status: DC

## 2017-10-15 MED ORDER — BISACODYL 5 MG PO TBEC: 5.0000 mg | DELAYED_RELEASE_TABLET | Freq: Every day | ORAL | 0 refills | Status: DC | PRN

## 2017-10-15 MED ORDER — ACETAMINOPHEN 325 MG PO TABS: 650.0000 mg | ORAL_TABLET | Freq: Four times a day (QID) | ORAL | 1 refills | Status: DC | PRN

## 2017-10-15 MED ORDER — AMLODIPINE BESYLATE 10 MG PO TABS: 10.0000 mg | ORAL_TABLET | Freq: Every day | ORAL | 1 refills | Status: DC

## 2017-10-15 MED ORDER — BISACODYL 5 MG PO TBEC
5.0000 mg | DELAYED_RELEASE_TABLET | Freq: Every day | ORAL | Status: DC | PRN
  Administered 2017-10-15: 5 mg via ORAL
  Filled 2017-10-15: qty 1

## 2017-10-15 NOTE — Progress Notes (Signed)
Clinical Social Worker following patient for support and discharge needs. CSW was able to place patient at [facility] with a 30 day LOG. Patient is aware of pending discharge to rehab facility tomorrow. Patient stated she is agreeable with plan and is aware that the LOG will only last for 30 days. CSW encouraged patient and cousins to have a back up plan once patient leave rehab facility because patient will not be able to stay until she is le to find low income housing. Patient stated she is aware and will work on everything.    Karen Ochoa, MSW,  [place]

## 2017-10-15 NOTE — Progress Notes (Addendum)
      [address]       [place] [address]             [phone]      8 Days Post-Op Procedure(s) (LRB): CORONARY ARTERY BYPASS GRAFTING (CABG) ON PUMP TIMES 5 USING LEFT INTERNAL MAMMARY ARTERY AND BILATERAL GREATER SAPHENOUS VEIN VIA ENDOHARDEST. (N/A) TRANSESOPHAGEAL ECHOCARDIOGRAM (TEE) (N/A) Subjective: No issues overnight. Has not had a bowel movement.   Objective: Vital signs in last 24 hours: Temp:  [97.5 F (36.4 C)-98.2 F (36.8 C)] 97.5 F (36.4 C) (03/29 0355) Pulse Rate:  [93-125] 125 (03/29 0355) Cardiac Rhythm: Normal sinus rhythm (03/29 0700) Resp:  [16-20] 16 (03/29 0355) BP: (111-155)/(77-103) 111/77 (03/29 0355) SpO2:  [97 %-100 %] 97 % (03/29 0355) Weight:  [171 lb 4.8 oz (77.7 kg)] 171 lb 4.8 oz (77.7 kg) (03/29 0355)     Intake/Output from previous day: 03/28 0701 - 03/29 0700 In: 1200 [P.O.:1200] Out: 1250 [Urine:1250] Intake/Output this shift: No intake/output data recorded.  General appearance: alert, cooperative and no distress Heart: regular rate and rhythm, S1, S2 normal, no murmur, click, rub or gallop Lungs: clear to auscultation bilaterally Abdomen: soft, non-tender; bowel sounds normal; no masses,  no organomegaly Extremities: extremities normal, atraumatic, no cyanosis or edema Wound: clean and dry  Lab Results: Recent Labs    10/14/17 0347  WBC 9.3  HGB 8.3*  HCT 26.2*  PLT 321   BMET:  Recent Labs    10/14/17 0347  NA 137  K 4.3  CL 103  CO2 24  GLUCOSE 115*  BUN 20  CREATININE 1.36*  CALCIUM 8.6*    PT/INR: No results for input(s): LABPROT, INR in the last 72 hours. ABG    Component Value Date/Time   PHART 7.436 10/07/2017 1919   HCO3 25.0 10/07/2017 1919   TCO2 26 10/08/2017 1632   ACIDBASEDEF 4.0 (H) 10/07/2017 1842   O2SAT 99.0 10/07/2017 1919   CBG (last 3)  Recent Labs    10/14/17 1627  GLUCAP 116*    Assessment/Plan: S/P Procedure(s) (LRB): CORONARY ARTERY BYPASS GRAFTING  (CABG) ON PUMP TIMES 5 USING LEFT INTERNAL MAMMARY ARTERY AND BILATERAL GREATER SAPHENOUS VEIN VIA ENDOHARDEST. (N/A) TRANSESOPHAGEAL ECHOCARDIOGRAM (TEE) (N/A)  1. CV-NSR rate in the 90s. BP well controlled. A few beats of wide complex tachy noted yesterday. of Tolerating Coreg, Lipitor, ASA, and Norvasc. Holding Cozaar for AKI.  2. Pulm- tolerating room air with good oxygen saturation 3. Renal-creatinine down to 1.36. Electrolytes okay.  4. H and H improving 8.3/26.2 this morning. Platelets now 321k 5. Endo-blood glucose well controlled on current regimen.  6. Previous stroke-working with PT/OT. Not a candidate for CIR due to insurance. Working on SNF placement since the patient is homeless.  7. Constipation-taking miralax and added dulcolax prn.   Plan: Working on placement/apartment for discharge. PT recommending SNF. Ambulate in the halls.     LOS: 9 days    Elgie Collard 10/15/2017   Chart reviewed, patient examined, agree with above. She is ready for SNF when bed available.

## 2017-10-15 NOTE — Progress Notes (Signed)
CARDIAC REHAB PHASE I   PRE:  Rate/Rhythm: 86 SR    BP: sitting 113/78    SaO2: 98 RA  MODE:  Ambulation: 580 ft   POST:  Rate/Rhythm: 106 ST    BP: sitting 102/66     SaO2: 100 RA  Pt motivated to walk. Able to move to EOB and stand. Used rollator for stability in hall. No major c/o. To recliner. Ed completed with pt. Good reception. She is planning to quit smoking although is nervous. She appreciated the fake cigarette. Discussed methods and resources. Discussed diet, ex, sternal precautions, IS. Will refer to [place]. Encouraged more walking today. [id number]   [place], ACSM 10/15/2017 11:08 AM

## 2017-10-16 NOTE — Progress Notes (Signed)
CARDIAC REHAB PHASE I   PRE:  Rate/Rhythm: Sinus Rhythm 77  BP:  Supine: 106/67     SaO2: 96%  MODE:  Ambulation: 580 ft   POST:  Rate/Rhythem: 93  BP:  Supine: 107/67     SaO2: 98%  [id number]  Patient ambulated independently in the hallway using a rollator without complaints. Patient assisted back to bed with call light within reach.   Harrell Gave RN BSN

## 2017-10-16 NOTE — Clinical Social Work Placement (Signed)
   CLINICAL SOCIAL WORK PLACEMENT  NOTE Thana Farr  RN to call report to [phone]  Date:  10/16/2017  Patient Details  Name: Karen Ochoa MRN: [id number] Date of Birth: 1966-06-14  Clinical Social Work is seeking post-discharge placement for this patient at the [place] level of care (*CSW will initial, date and re-position this form in  chart as items are completed):  Yes   Patient/family provided with [place] Work Department's list of facilities offering this level of care within the geographic area requested by the patient (or if unable, by the patient's family).  Yes   Patient/family informed of their freedom to choose among providers that offer the needed level of care, that participate in Medicare, Medicaid or managed care program needed by the patient, have an available bed and are willing to accept the patient.  Yes   Patient/family informed of [facility]'s ownership interest in [facility] and [facility], as well as of the fact that they are under no obligation to receive care at these facilities.  PASRR submitted to EDS on       PASRR number received on 10/10/17     Existing PASRR number confirmed on       FL2 transmitted to all facilities in geographic area requested by pt/family on 10/10/17     FL2 transmitted to all facilities within larger geographic area on       Patient informed that his/her managed care company has contracts with or will negotiate with certain facilities, including the following:        Yes   Patient/family informed of bed offers received.  Patient chooses bed at [facility](30 Day LOG)     Physician recommends and patient chooses bed at      Patient to be transferred to [facility] on 10/16/17.  Patient to be transferred to facility by [facility]     Patient family notified on 10/16/17 of transfer.  Name of family member notified:  [name],  cousin     PHYSICIAN       Additional Comment:    _______________________________________________ Alexander Mt, [place] 10/16/2017, 11:19 AM

## 2017-10-16 NOTE — Social Work (Addendum)
CSW received message from pt cousin, the family of pt is out of town and not going to transport the pt. CSW has reached out to the CSW dept to see if we are able to arrange PTAR for pt to discharge to [facility].   Await confirmation of transport.   11:03am- Clinical Social Worker facilitated patient discharge including contacting patient family and facility to confirm patient discharge plans.  Clinical information faxed to facility and family agreeable with plan.  CSW arranged wheelchair Lucianne Lei transport at 12:30pm via [employer] [facility] to [employer].   RN to call [phone] with report  prior to discharge.  Clinical Social Worker will sign off for now as social work intervention is no longer needed. Please consult [place] again if new need arises.   Alexander Mt, [place] Work [phone]

## 2017-10-16 NOTE — Progress Notes (Signed)
Report called to Reita May, LPN at [facility].   Emelda Fear, RN

## 2017-10-16 NOTE — Progress Notes (Addendum)
Discharge instructions printed and sent with patient. [facility] transport here to take patient at this time. Patient left floor via wheelchair.   Emelda Fear, RN

## 2017-10-16 NOTE — Progress Notes (Addendum)
      [address]       [place] [address]             [phone]      9 Days Post-Op Procedure(s) (LRB): CORONARY ARTERY BYPASS GRAFTING (CABG) ON PUMP TIMES 5 USING LEFT INTERNAL MAMMARY ARTERY AND BILATERAL GREATER SAPHENOUS VEIN VIA ENDOHARDEST. (N/A) TRANSESOPHAGEAL ECHOCARDIOGRAM (TEE) (N/A) Subjective: No issues overnight. Patient is dressed and ready for transportation of SNF this morning.   Objective: Vital signs in last 24 hours: Temp:  [97.4 F (36.3 C)-98.5 F (36.9 C)] 97.5 F (36.4 C) (03/30 0352) Pulse Rate:  [84-94] 84 (03/30 0352) Cardiac Rhythm: Normal sinus rhythm (03/30 0700) Resp:  [14-20] 14 (03/30 0352) BP: (99-119)/(72-88) 119/80 (03/30 1021) SpO2:  [96 %-97 %] 97 % (03/29 1923) Weight:  [171 lb 3.2 oz (77.7 kg)] 171 lb 3.2 oz (77.7 kg) (03/30 0352)     Intake/Output from previous day: 03/29 0701 - 03/30 0700 In: 840 [P.O.:840] Out: 400 [Urine:400] Intake/Output this shift: Total I/O In: 118 [P.O.:118] Out: -   General appearance: alert, cooperative and no distress Heart: regular rate and rhythm, S1, S2 normal, no murmur, click, rub or gallop Lungs: clear to auscultation bilaterally Abdomen: soft, non-tender; bowel sounds normal; no masses,  no organomegaly Extremities: extremities normal, atraumatic, no cyanosis or edema Wound: clean and dry  Lab Results: Recent Labs    10/14/17 0347  WBC 9.3  HGB 8.3*  HCT 26.2*  PLT 321   BMET:  Recent Labs    10/14/17 0347  NA 137  K 4.3  CL 103  CO2 24  GLUCOSE 115*  BUN 20  CREATININE 1.36*  CALCIUM 8.6*    PT/INR: No results for input(s): LABPROT, INR in the last 72 hours. ABG    Component Value Date/Time   PHART 7.436 10/07/2017 1919   HCO3 25.0 10/07/2017 1919   TCO2 26 10/08/2017 1632   ACIDBASEDEF 4.0 (H) 10/07/2017 1842   O2SAT 99.0 10/07/2017 1919   CBG (last 3)  Recent Labs    10/14/17 1627  GLUCAP 116*    Assessment/Plan: S/P Procedure(s)  (LRB): CORONARY ARTERY BYPASS GRAFTING (CABG) ON PUMP TIMES 5 USING LEFT INTERNAL MAMMARY ARTERY AND BILATERAL GREATER SAPHENOUS VEIN VIA ENDOHARDEST. (N/A) TRANSESOPHAGEAL ECHOCARDIOGRAM (TEE) (N/A)  1. CV-NSR in the 80s. BP well controlled. Tolerating Coreg, Lipitor, ASA, and Norvasc. Holding Cozaar for AKI.  2. Pulm- tolerating room air with good oxygen saturation 3. Renal-creatinine down to 1.36. Electrolytes okay.  4. H and H improving 8.3/26.2. Platelets now 321k 5. Endo-blood glucose well controlled on current regimen.  6. Previous stroke-working with PT/OT. Not a candidate for CIR due to insurance. 7. Constipation-taking miralax and added dulcolax prn.   Plan: Discharge this morning to SNF. Follow-up has been arranged.    LOS: 10 days    Elgie Collard 10/16/2017   patient examined and medical record reviewed,agree with above note. Tharon Aquas Trigt III 10/16/2017

## 2017-10-18 ENCOUNTER — Telehealth (HOSPITAL_COMMUNITY): Payer: Self-pay

## 2017-10-18 NOTE — Telephone Encounter (Signed)
Patient's Medicaid is currently pending. Will call patient to discuss insurance.

## 2017-10-18 NOTE — Telephone Encounter (Signed)
Called to speak with patient in regards to Insurance - patient stated she is [place], [place] at [facility]. Patient is not happy. Patient stated she wants nothing to do with [facility] and will not be coming to do the CR program. Closed referral.

## 2017-10-19 ENCOUNTER — Ambulatory Visit: Payer: Self-pay | Admitting: Physician Assistant

## 2017-10-20 ENCOUNTER — Ambulatory Visit (INDEPENDENT_AMBULATORY_CARE_PROVIDER_SITE_OTHER): Payer: Self-pay

## 2017-10-20 ENCOUNTER — Other Ambulatory Visit: Payer: Self-pay

## 2017-10-20 DIAGNOSIS — Z4802 Encounter for removal of sutures: Secondary | ICD-10-CM

## 2017-10-20 NOTE — Progress Notes (Signed)
Patient arrived for nurse visit to remove 3 chest tube site sutures post- procedure CABG x5 10/07/2017 .  Sutures removed with no signs/ symptoms of infection noted. Incisions may drain after removal of sutures and could cover with a bandage if needed.  Explained to patient that this is normal, however if there are any changes to left the office know.  Patient tolerated procedure well.  Patient instructed to keep the incision sites clean and dry and sent orders via sheet to facility which she is staying in.  Patient/ family acknowledged instructions given.

## 2017-10-21 ENCOUNTER — Telehealth: Payer: Self-pay

## 2017-10-21 NOTE — Telephone Encounter (Signed)
RN call patient to schedule a stroke hospital follow up. Pt had an appt in March but had to cancel due to cardiac surgery was schedule. 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 Rn tried to schedule a follow up with pt. Pt stated she will be [facility] in [place] till November 15, 2017. Rn tried to schedule pt in May 2019 with Dr Erlinda Hong. Pt decline and stated if the appt was made in May she would forget about it. Pt states she plan on moving back to [place] to see her cardiac MD, and follow up appts. She recommend I speak with the transportation coordinator at [employer] for an appt.

## 2017-10-21 NOTE — Telephone Encounter (Signed)
Rn spoke with Musician  at [employer] in [place] stated pt needs a stroke hospital follow up appt. Ivin Booty stated pt was admitted over the weekend, and and is trying to leave the facility early. Rn schedule appt on 11/16/2017 at 1000am, check in time is 0930am. Ivin Booty wrote down appt time, and was given the address of the MD office.

## 2017-10-26 ENCOUNTER — Telehealth: Payer: Self-pay | Admitting: Cardiovascular Disease

## 2017-10-26 NOTE — Telephone Encounter (Signed)
New Message:    Pt states she wants to confirm that she needs this appt because pt she was just seen last week to get stitches removed.

## 2017-10-26 NOTE — Telephone Encounter (Signed)
Called patient and explained the need for her to keep her appointment with cardiology in addition to the appointments with [facility]. She is aware of appointment tomorrow and agrees to come in. She thanked me for the call.

## 2017-10-27 ENCOUNTER — Encounter: Payer: Self-pay | Admitting: Physician Assistant

## 2017-10-27 ENCOUNTER — Ambulatory Visit (INDEPENDENT_AMBULATORY_CARE_PROVIDER_SITE_OTHER): Payer: Self-pay | Admitting: Physician Assistant

## 2017-10-27 VITALS — BP 102/68 | HR 77 | Ht 67.0 in | Wt 175.0 lb

## 2017-10-27 DIAGNOSIS — I63019 Cerebral infarction due to thrombosis of unspecified vertebral artery: Secondary | ICD-10-CM

## 2017-10-27 DIAGNOSIS — I10 Essential (primary) hypertension: Secondary | ICD-10-CM

## 2017-10-27 DIAGNOSIS — F17200 Nicotine dependence, unspecified, uncomplicated: Secondary | ICD-10-CM

## 2017-10-27 DIAGNOSIS — N289 Disorder of kidney and ureter, unspecified: Secondary | ICD-10-CM

## 2017-10-27 DIAGNOSIS — Z951 Presence of aortocoronary bypass graft: Secondary | ICD-10-CM

## 2017-10-27 DIAGNOSIS — I255 Ischemic cardiomyopathy: Secondary | ICD-10-CM

## 2017-10-27 MED ORDER — ATORVASTATIN CALCIUM 80 MG PO TABS: 80.0000 mg | ORAL_TABLET | Freq: Every day | ORAL | 6 refills | Status: DC

## 2017-10-27 MED ORDER — NICOTINE 7 MG/24HR TD PT24: 7.0000 mg | MEDICATED_PATCH | Freq: Every day | TRANSDERMAL | 0 refills | Status: DC

## 2017-10-27 MED ORDER — AMLODIPINE BESYLATE 10 MG PO TABS: 10.0000 mg | ORAL_TABLET | Freq: Every day | ORAL | 6 refills | Status: DC

## 2017-10-27 MED ORDER — NICOTINE 14 MG/24HR TD PT24: 14.0000 mg | MEDICATED_PATCH | Freq: Every day | TRANSDERMAL | 0 refills | Status: AC

## 2017-10-27 MED ORDER — CARVEDILOL 25 MG PO TABS: 25.0000 mg | ORAL_TABLET | Freq: Two times a day (BID) | ORAL | 6 refills | Status: DC

## 2017-10-27 NOTE — Patient Instructions (Signed)
Medication Instructions:  Your physician recommends that you continue on your current medications as directed. Please refer to the Current Medication list given to you today.  Labwork: NONE  Testing/Procedures: NONE  Follow-Up: Your physician wants you to follow-up in: 6 weeks with Dr. Acie Fredrickson.   If you need a refill on your cardiac medications before your next appointment, please call your pharmacy.

## 2017-10-27 NOTE — Progress Notes (Signed)
Cardiology Office Note    Date:  10/27/2017   ID:  Karen Ochoa, DOB 10-Apr-1966, MRN [id number]  PCP:  [name], FNP  Cardiologist: Mertie Moores, MD  Chief Complaint  Patient presents with  . Hospitalization Follow-up    History of Present Illness:  Karen Ochoa is a 52 y.o. female with history of CVA 07/18/17 associated with severe hypertension treated with aspirin and statin by neurology,  TIAs, CKD stage III and tobacco abuse, EtOH presented to the hospital with unstable angina.  2D echo LVEF 30-35% with global hypokinesis.  Cardiac cath showed three-vessel CAD EF 55% with inferior hypokinesis.  Patient is homeless and living on her cousin's couch, trying to get disability since her stroke.  She underwent CABG x 5, 1/66/06 complicated by alcohol withdrawal and hypertension.  Cozaar was held because of elevated creatinine.  Last creatinine 1.36 on 10/14/17 hemoglobin 8.3.  She was discharged to a rehab facility in [place].  Patient comes in today by herself.  She says she is not getting any physical therapy at the rehab center.  She is going to be discharged next Thursday with plans to live with her cousin until her Social Security comes in.  She denies chest pain, palpitations, dizziness or presyncope.  She says the pollen is bothering her allergies.  She has some dyspnea on exertion.  Using nicotine patch and has not been smoking.  No alcohol.    Past Medical History:  Diagnosis Date  . Asthma 2017  . Fibroids   . GERD (gastroesophageal reflux disease)   . Headache    "weekly" (07/20/2017)  . High cholesterol 07/18/2017  . Hx MRSA infection   . Hypertension   . Insomnia due to medical condition   . Stroke ([place]) 07/18/2017  . TIA (transient ischemic attack)    "I've had several"    Past Surgical History:  Procedure Laterality Date  . CORONARY ARTERY BYPASS GRAFT N/A 10/07/2017   Procedure: CORONARY ARTERY BYPASS GRAFTING (CABG) ON PUMP TIMES 5  USING LEFT INTERNAL MAMMARY ARTERY AND BILATERAL GREATER SAPHENOUS VEIN VIA ENDOHARDEST.;  Surgeon: Gaye Pollack, MD;  Location: [facility] OR;  Service: Open Heart Surgery;  Laterality: N/A;  . RIGHT/LEFT HEART CATH AND CORONARY ANGIOGRAPHY N/A 10/05/2017   Procedure: RIGHT/LEFT HEART CATH AND CORONARY ANGIOGRAPHY;  Surgeon: Belva Crome, MD;  Location: [facility];  Service: Cardiovascular;  Laterality: N/A;  . TEE WITHOUT CARDIOVERSION N/A 10/07/2017   Procedure: TRANSESOPHAGEAL ECHOCARDIOGRAM (TEE);  Surgeon: Gaye Pollack, MD;  Location: [facility];  Service: Open Heart Surgery;  Laterality: N/A;  . WISDOM TOOTH EXTRACTION      Current Medications: Current Meds  Medication Sig  . acetaminophen (TYLENOL) 325 MG tablet Take 2 tablets (650 mg total) by mouth every 6 (six) hours as needed for mild pain.  Marland Kitchen amLODipine (NORVASC) 10 MG tablet Take 1 tablet (10 mg total) by mouth daily.  Marland Kitchen aspirin 325 MG tablet Take 1 tablet (325 mg total) by mouth daily.  Marland Kitchen atorvastatin (LIPITOR) 80 MG tablet Take 1 tablet (80 mg total) by mouth daily at 6 PM.  . bisacodyl (DULCOLAX) 5 MG EC tablet Take 1 tablet (5 mg total) by mouth daily as needed for moderate constipation.  . carvedilol (COREG) 25 MG tablet Take 1 tablet (25 mg total) by mouth 2 (two) times daily with a meal.  . ferrous [id number] C-folic acid (TRINSICON / FOLTRIN) capsule Take 1 capsule by mouth  3 (three) times daily after meals.  . nicotine (NICODERM CQ - DOSED IN MG/24 HOURS) 14 mg/24hr patch Place 14 mg onto the skin daily.  Marland Kitchen oxyCODONE (OXY IR/ROXICODONE) 5 MG immediate release tablet Take 1 tablet (5 mg total) by mouth every 6 (six) hours as needed for severe pain.  . traZODone (DESYREL) 50 MG tablet Take 0.5 tablets (25 mg total) by mouth at bedtime as needed for sleep.  . VENTOLIN HFA 108 (90 Base) MCG/ACT inhaler INHALE 2 PUFFS INTO THE LUNGS EVERY 6 (SIX) HOURS AS NEEDED FOR WHEEZING OR SHORTNESS OF BREATH.     Allergies:    Penicillins; Sulfonamide derivatives; Clonidine derivatives; Flagyl [metronidazole]; and Labetalol   Social History   Socioeconomic History  . Marital status: Single    Spouse name: Not on file  . Number of children: Not on file  . Years of education: Not on file  . Highest education level: Not on file  Occupational History  . Not on file  Social Needs  . Financial resource strain: Not on file  . Food insecurity:    Worry: Not on file    Inability: Not on file  . Transportation needs:    Medical: Not on file    Non-medical: Not on file  Tobacco Use  . Smoking status: Former Smoker    Packs/day: 0.50    Years: 10.00    Pack years: 5.00    Types: Cigarettes  . Smokeless tobacco: Never Used  Substance and Sexual Activity  . Alcohol use: No    Alcohol/week: 1.2 oz    Types: 2 Glasses of wine per week    Frequency: Never    Comment: 08/04/2017--states no   . Drug use: No  . Sexual activity: Yes    Birth control/protection: None  Lifestyle  . Physical activity:    Days per week: Not on file    Minutes per session: Not on file  . Stress: Not on file  Relationships  . Social connections:    Talks on phone: Not on file    Gets together: Not on file    Attends religious service: Not on file    Active member of club or organization: Not on file    Attends meetings of clubs or organizations: Not on file    Relationship status: Not on file  Other Topics Concern  . Not on file  Social History Narrative  . Not on file     Family History:  The patient's family history includes Asthma in her brother; Diabetes in her brother and sister; Hypertension in her mother.   ROS:   Please see the history of present illness.    Review of Systems  Constitution: Negative.  HENT: Negative.   Eyes: Negative.   Cardiovascular: Positive for dyspnea on exertion.  Respiratory: Positive for cough.   Hematologic/Lymphatic: Negative.   Musculoskeletal: Negative.  Negative for joint pain.    Gastrointestinal: Negative.   Genitourinary: Negative.   Neurological: Positive for headaches.  Psychiatric/Behavioral: Positive for depression.   All other systems reviewed and are negative.   PHYSICAL EXAM:   VS:  BP 102/68   Pulse 77   Ht 5\' 7"  (1.702 m)   Wt 175 lb (79.4 kg)   LMP 05/19/2013   BMI 27.41 kg/m   Physical Exam  GEN: Well nourished, well developed, in no acute distress  Neck: no JVD, carotid bruits, or masses Cardiac:RRR; 2/6 systolic murmur of the left sternal border Respiratory: Decreased breath  sounds throughout GI: soft, nontender, nondistended, + BS Ext: Trace of right ankle edema otherwise without cyanosis, clubbing,  Good distal pulses bilaterally Neuro:  Alert and Oriented x 3 Psych: euthymic mood, full affect  Wt Readings from Last 3 Encounters:  10/27/17 175 lb (79.4 kg)  10/16/17 171 lb 3.2 oz (77.7 kg)  09/23/17 179 lb 6.4 oz (81.4 kg)      Studies/Labs Reviewed:   EKG:  EKG is ordered today.  The ekg ordered today demonstrates normal sinus rhythm with T wave inversion inferior lateral which are new but patient without any symptoms.  Recent Labs: 07/20/2017: B Natriuretic Peptide 306.8; TSH 0.856 10/06/2017: ALT 11 10/08/2017: Magnesium 2.8 10/14/2017: BUN 20; Creatinine, Ser 1.36; Hemoglobin 8.3; Platelets 321; Potassium 4.3; Sodium 137   Lipid Panel    Component Value Date/Time   CHOL 187 07/19/2017 0556   TRIG 210 (H) 07/19/2017 0556   HDL 40 (L) 07/19/2017 0556   CHOLHDL 4.7 07/19/2017 0556   VLDL 42 (H) 07/19/2017 0556   LDLCALC 105 (H) 07/19/2017 0556    Additional studies/ records that were reviewed today include:    Echo 07/20/17: Study Conclusions   - Left ventricle: The cavity size was normal. There was severe   concentric hypertrophy. Systolic function was moderately to   severely reduced. The estimated ejection fraction was in the   range of 30% to 35%. Diffuse hypokinesis. Doppler parameters are   consistent with  abnormal left ventricular relaxation (grade 1   diastolic dysfunction). Doppler parameters are consistent with   elevated ventricular end-diastolic filling pressure. - Aortic valve: There was trivial regurgitation. - Aortic root: The aortic root was normal in size. - Mitral valve: There was mild regurgitation directed posteriorly. - Left atrium: The atrium was mildly dilated. - Right ventricle: The cavity size was normal. Wall thickness was   normal. Systolic function was normal. - Right atrium: The atrium was normal in size. - Tricuspid valve: There was mild regurgitation. - Pulmonic valve: There was trivial regurgitation. - Pulmonary arteries: Systolic pressure was within the normal   range. - Inferior vena cava: The vessel was normal in size. - Pericardium, extracardiac: There was no pericardial effusion.   Impressions:   - Severe concentric LVH with diffuse hypokinesis and LVEF 30-35%. A   cardiac MRI is recommended to differentiate hypertensive vs   infiltrative cardiomyopathy.   Cardiac cath: Procedures    RIGHT/LEFT HEART CATH AND CORONARY ANGIOGRAPHY  Conclusion     Multivessel coronary artery disease, with Syntax I score of 29.  Normal left main  Segmental calcified 85% mid LAD stenosis forming a [place] 011 bifurcation lesion with a large first diagonal.  LAD wraps around left ventricular apex and supplies collaterals to a small diffusely diseased PDA.  The circumflex is a codominant system with the right coronary and has severe disease in the first (90%) and second (70%) obtuse marginal branches beyond moderate to severe tortuosity.  The mid circumflex between the first and second obtuse marginals contains diffuse disease with up to 80% stenosis.  Right coronary is totally occluded proximally with distal continuation supplied by collaterals from the circumflex.  Inferior basal wall severe hypokinesis.  Overall normal EF of 55%.  Normal LVEDP.  Normal pulmonary  artery pressures.  Pulmonary capillary wedge pressure 24.  LVEDP is normal.   RECOMMENDATIONS:    Relatively young female with significant comorbidity and multivessel coronary disease with intermediate risk for PCI based on Syntax I score.  Further uptitrate  statin intensity  Evaluation for multivessel CABG.          ASSESSMENT:    1. S/P CABG x 5   2. Ischemic cardiomyopathy   3. 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 Essential hypertension   5. Renal insufficiency   6. Smoker      PLAN:  In order of problems listed above:  Status post CABG x 5, 0/17/49 complicated by AK I, EtOH withdrawal, and hypertension unable to use Cozaar.  Patient doing well at rehab facility and to be discharged next week.  Not exercising like she needs to.  Recommend incentive spirometry.  Continue current meds.  Will give her prescriptions through [facility] for after she is discharged from rehab facility.  Follow-up with Dr. Acie Fredrickson in 6 weeks.  Ischemic cardiomyopathy LVEF 30-35% but follow-up TEE EF 50-55%.  No significant CHF on exam.  CVA 07/18/17 secondary to severe hypertension treated with aspirin and statin by neurology  Essential hypertension blood pressure on the low side today.  Doing much better.  No changes.  Renal insufficiency we will not resume Cozaar as his creatinine was still up a little and blood pressure stable.  Tobacco abuse not smoking with nicotine patch.  We will continue 14 mg daily for 4 more weeks then 7 mg daily for 2 weeks then stop.    Medication Adjustments/Labs and Tests Ordered: Current medicines are reviewed at length with the patient today.  Concerns regarding medicines are outlined above.  Medication changes, Labs and Tests ordered today are listed in the Patient Instructions below. Patient Instructions  Medication Instructions:  Your physician recommends that you continue on  your current medications as directed. Please refer to the Current Medication list given to you today.  Labwork: NONE  Testing/Procedures: NONE  Follow-Up: Your physician wants you to follow-up in: 6 weeks with Dr. Acie Fredrickson.   If you need a refill on your cardiac medications before your next appointment, please call your pharmacy.       Sumner Boast, PA-C  10/27/2017 11:27 AM    [facility] [place], [place], [place]  [address] Phone: [phone]; Fax: [phone]

## 2017-11-04 ENCOUNTER — Ambulatory Visit: Payer: Self-pay | Admitting: Family Medicine

## 2017-11-16 ENCOUNTER — Encounter: Payer: Self-pay | Admitting: Neurology

## 2017-11-16 ENCOUNTER — Ambulatory Visit: Payer: Self-pay | Admitting: Neurology

## 2017-11-16 ENCOUNTER — Other Ambulatory Visit: Payer: Self-pay | Admitting: Surgery

## 2017-11-16 ENCOUNTER — Telehealth: Payer: Self-pay

## 2017-11-16 DIAGNOSIS — Z951 Presence of aortocoronary bypass graft: Secondary | ICD-10-CM

## 2017-11-16 NOTE — Telephone Encounter (Signed)
Patient came for appt today. Pt did not have co payment per front desk. Pt does not have current insurance.

## 2017-11-17 ENCOUNTER — Ambulatory Visit: Payer: Self-pay | Admitting: Surgery

## 2017-11-17 ENCOUNTER — Ambulatory Visit
Admission: RE | Admit: 2017-11-17 | Discharge: 2017-11-17 | Disposition: A | Discharge status: Home / Self Care | Payer: Self-pay | Source: Ambulatory Visit | Attending: Surgery | Admitting: Surgery

## 2017-11-17 ENCOUNTER — Encounter: Payer: Self-pay | Admitting: Physician Assistant

## 2017-11-17 ENCOUNTER — Ambulatory Visit (INDEPENDENT_AMBULATORY_CARE_PROVIDER_SITE_OTHER): Payer: Self-pay | Admitting: Surgery

## 2017-11-17 ENCOUNTER — Telehealth: Payer: Self-pay

## 2017-11-17 VITALS — BP 104/64 | HR 72 | Resp 20 | Ht 67.0 in | Wt 172.0 lb

## 2017-11-17 DIAGNOSIS — I2510 Atherosclerotic heart disease of native coronary artery without angina pectoris: Secondary | ICD-10-CM

## 2017-11-17 DIAGNOSIS — Z951 Presence of aortocoronary bypass graft: Secondary | ICD-10-CM

## 2017-11-17 NOTE — Telephone Encounter (Signed)
Called patient, she wants to discuss applying for disability with husband. I advised her she would need to come in and discuss this during an appointment. Thanks!

## 2017-11-17 NOTE — Progress Notes (Signed)
HPI: Patient returns for routine postoperative follow-up having undergone coronary artery bypass graft surgery x5 on 10/07/2017. The patient's early postoperative recovery while in the hospital was notable for an uncomplicated postoperative course.  She was somewhat slow due to a history of previous stroke on 07/18/2017 related to severe hypertension.  She had some confusion and hypertension postoperatively and it was felt that this may be related to alcohol withdrawal.  She was discharged to a rehab facility in [place], [place].  She does not feel like she was getting any significant rehab at this facility and was discharged with plans to live with her cousin.  She said that this did not work out and she is now living in a motel with her boyfriend.  She feels well and denies any chest pain or shortness of breath.  She is walking daily.    Current Outpatient Medications  Medication Sig Dispense Refill  . acetaminophen (TYLENOL) 325 MG tablet Take 2 tablets (650 mg total) by mouth every 6 (six) hours as needed for mild pain. 30 tablet 1  . amLODipine (NORVASC) 10 MG tablet Take 1 tablet (10 mg total) by mouth daily. 30 tablet 6  . aspirin 325 MG tablet Take 1 tablet (325 mg total) by mouth daily. 30 tablet 5  . atorvastatin (LIPITOR) 80 MG tablet Take 1 tablet (80 mg total) by mouth daily at 6 PM. 30 tablet 6  . bisacodyl (DULCOLAX) 5 MG EC tablet Take 1 tablet (5 mg total) by mouth daily as needed for moderate constipation. 30 tablet 0  . carvedilol (COREG) 25 MG tablet Take 1 tablet (25 mg total) by mouth 2 (two) times daily with a meal. 60 tablet 6  . ferrous [id number] C-folic acid (TRINSICON / FOLTRIN) capsule Take 1 capsule by mouth 3 (three) times daily after meals. 90 capsule 1  . nicotine (NICODERM CQ - DOSED IN MG/24 HOURS) 14 mg/24hr patch Place 1 patch (14 mg total) onto the skin daily for 28 days. 28 patch 0  . [START ON 11/25/2017] nicotine (NICODERM CQ - DOSED IN  MG/24 HR) 7 mg/24hr patch Place 1 patch (7 mg total) onto the skin daily for 14 days. 14 patch 0  . traZODone (DESYREL) 50 MG tablet Take 0.5 tablets (25 mg total) by mouth at bedtime as needed for sleep. 30 tablet 1  . VENTOLIN HFA 108 (90 Base) MCG/ACT inhaler INHALE 2 PUFFS INTO THE LUNGS EVERY 6 (SIX) HOURS AS NEEDED FOR WHEEZING OR SHORTNESS OF BREATH. 18 g 0  . oxyCODONE (OXY IR/ROXICODONE) 5 MG immediate release tablet Take 1 tablet (5 mg total) by mouth every 6 (six) hours as needed for severe pain. (Patient not taking: Reported on 11/17/2017) 30 tablet 0   No current facility-administered medications for this visit.     Physical Exam: BP 104/64   Pulse 72   Resp 20   Ht 5\' 7"  (1.702 m)   Wt 172 lb (78 kg)   LMP 05/19/2013   BMI 26.94 kg/m  She looks well. Cardiac exam shows a regular rate and rhythm with normal heart sounds. Lungs are clear. Chest incision is healing well and sternum is stable. Both leg incisions are healing well and there is no lower extremity edema.  Diagnostic Tests:  CLINICAL DATA:  Status post CABG on October 07, 2017. No current complaints.  EXAM: CHEST - 2 VIEW  COMPARISON:  PA and lateral chest x-ray of October 10, 2017  FINDINGS: The lungs  are well-expanded and clear. There is no pleural effusion or pneumothorax. The heart is normal in size. The pulmonary vascularity is not engorged. The sternal wires are intact. The retrosternal soft tissues are normal. The bony thorax exhibits no acute abnormality.  IMPRESSION: There is no active cardiopulmonary disease. No residual pleural effusion.   Electronically Signed   By: David  Martinique M.D.   On: 11/17/2017 11:06   Impression:  Overall I think she has made a good recovery following her coronary bypass graft surgery especially considering her previous stroke.  I encouraged her to continue walking as much as possible.  She had stopped smoking for a while and was using a nicotine patch but  reports that she returned to smoking a few cigarettes per day.  I strongly encouraged her to discontinue smoking and to use the nicotine patches as needed.  I discussed the importance of maximum cardiac risk factor reduction including maintaining a good well-balanced heart healthy diet, smoking cessation, and medication compliance.  Unfortunately her social situation is somewhat tenuous and she has no health insurance.  She is trying to get on disability.  Plan:  She has already seen cardiology postoperatively in the office and will follow-up with Dr. Acie Fredrickson in a couple weeks.   Gaye Pollack, MD [facility] [phone]

## 2017-11-19 ENCOUNTER — Ambulatory Visit: Payer: Self-pay | Admitting: Family Medicine

## 2017-12-08 ENCOUNTER — Encounter: Payer: Self-pay | Admitting: Family Medicine

## 2017-12-08 ENCOUNTER — Encounter: Payer: Self-pay | Admitting: Physician Assistant

## 2017-12-08 ENCOUNTER — Ambulatory Visit (INDEPENDENT_AMBULATORY_CARE_PROVIDER_SITE_OTHER): Payer: Self-pay | Admitting: Family Medicine

## 2017-12-08 ENCOUNTER — Ambulatory Visit (INDEPENDENT_AMBULATORY_CARE_PROVIDER_SITE_OTHER): Payer: Self-pay | Admitting: Physician Assistant

## 2017-12-08 VITALS — BP 136/82 | HR 68 | Temp 98.3°F | Resp 16 | Ht 67.0 in | Wt 177.0 lb

## 2017-12-08 VITALS — BP 132/72 | HR 68 | Ht 67.0 in | Wt 177.0 lb

## 2017-12-08 DIAGNOSIS — F17200 Nicotine dependence, unspecified, uncomplicated: Secondary | ICD-10-CM

## 2017-12-08 DIAGNOSIS — Z8673 Personal history of transient ischemic attack (TIA), and cerebral infarction without residual deficits: Secondary | ICD-10-CM

## 2017-12-08 DIAGNOSIS — N183 Chronic kidney disease, stage 3 unspecified: Secondary | ICD-10-CM

## 2017-12-08 DIAGNOSIS — I2510 Atherosclerotic heart disease of native coronary artery without angina pectoris: Secondary | ICD-10-CM

## 2017-12-08 DIAGNOSIS — Z951 Presence of aortocoronary bypass graft: Secondary | ICD-10-CM

## 2017-12-08 DIAGNOSIS — I10 Essential (primary) hypertension: Secondary | ICD-10-CM

## 2017-12-08 DIAGNOSIS — N2889 Other specified disorders of kidney and ureter: Secondary | ICD-10-CM

## 2017-12-08 DIAGNOSIS — I255 Ischemic cardiomyopathy: Secondary | ICD-10-CM

## 2017-12-08 DIAGNOSIS — G4700 Insomnia, unspecified: Secondary | ICD-10-CM

## 2017-12-08 MED ORDER — TRAZODONE HCL 50 MG PO TABS: 25.0000 mg | ORAL_TABLET | Freq: Every evening | ORAL | 2 refills | Status: DC | PRN

## 2017-12-08 MED ORDER — ATORVASTATIN CALCIUM 80 MG PO TABS: 80.0000 mg | ORAL_TABLET | Freq: Every day | ORAL | 6 refills | Status: DC

## 2017-12-08 MED ORDER — CARVEDILOL 25 MG PO TABS: 25.0000 mg | ORAL_TABLET | Freq: Two times a day (BID) | ORAL | 6 refills | Status: DC

## 2017-12-08 MED ORDER — AMLODIPINE BESYLATE 10 MG PO TABS: 10.0000 mg | ORAL_TABLET | Freq: Every day | ORAL | 6 refills | Status: DC

## 2017-12-08 MED ORDER — NICOTINE 7 MG/24HR TD PT24: 7.0000 mg | MEDICATED_PATCH | Freq: Every day | TRANSDERMAL | 1 refills | Status: AC

## 2017-12-08 NOTE — Progress Notes (Signed)
Cardiology Office Note    Date:  12/08/2017   ID:  Karen Ochoa, DOB 11-Jul-1966, MRN [id number]  PCP:  [name], FNP  Cardiologist: Mertie Moores, MD  Chief Complaint  Patient presents with  . Follow-up    History of Present Illness:  Karen Ochoa is a 52 y.o. female with history of CVA 07/18/17 associated with severe hypertension treated with aspirin and statin by neurology,  TIAs, CKD stage III and tobacco abuse, EtOH presented to the hospital with unstable angina.  2D echo LVEF 30-35% with global hypokinesis.  Cardiac cath showed three-vessel CAD EF 55% with inferior hypokinesis.  Patient is homeless and living on her cousin's couch, trying to get disability since her stroke.  She underwent CABG x 5, 3/41/93 complicated by alcohol withdrawal and hypertension.  Cozaar was held because of elevated creatinine.  Last creatinine 1.36 on 10/14/17 hemoglobin 8.3.  She was discharged to a rehab facility in [place], [place].   I saw the patient 10/27/2017 at which time she was going to be discharged from the rehab facility.  She was going to live with her cousin until Social Security came in.  She was not drinking any alcohol and had not been smoking using the nicotine patch.  Patient comes in today accompanied by her significant other.  She is now living in a motel since she was discharged from rehab.  Still waiting on disability.  She was sent home on losartan 25 mg daily but has not had any blood work checked.  Unfortunately she started smoking 3 to 4 cigarettes a day and is drinking a beer or 2 every other day. Denies chest pain, palpitations, dyspnea, dyspnea on exertion, dizziness or presyncope.  Has a follow-up appointment with her PCP today.    Past Medical History:  Diagnosis Date  . Asthma 2017  . Fibroids   . GERD (gastroesophageal reflux disease)   . Headache    "weekly" (07/20/2017)  . High cholesterol 07/18/2017  . Hx MRSA infection   . Hypertension   .  Insomnia due to medical condition   . Stroke ([place]) 07/18/2017  . TIA (transient ischemic attack)    "I've had several"    Past Surgical History:  Procedure Laterality Date  . CORONARY ARTERY BYPASS GRAFT N/A 10/07/2017   Procedure: CORONARY ARTERY BYPASS GRAFTING (CABG) ON PUMP TIMES 5 USING LEFT INTERNAL MAMMARY ARTERY AND BILATERAL GREATER SAPHENOUS VEIN VIA ENDOHARDEST.;  Surgeon: Gaye Pollack, MD;  Location: [facility] OR;  Service: Open Heart Surgery;  Laterality: N/A;  . RIGHT/LEFT HEART CATH AND CORONARY ANGIOGRAPHY N/A 10/05/2017   Procedure: RIGHT/LEFT HEART CATH AND CORONARY ANGIOGRAPHY;  Surgeon: Belva Crome, MD;  Location: [facility];  Service: Cardiovascular;  Laterality: N/A;  . TEE WITHOUT CARDIOVERSION N/A 10/07/2017   Procedure: TRANSESOPHAGEAL ECHOCARDIOGRAM (TEE);  Surgeon: Gaye Pollack, MD;  Location: [facility];  Service: Open Heart Surgery;  Laterality: N/A;  . WISDOM TOOTH EXTRACTION      Current Medications: Current Meds  Medication Sig  . acetaminophen (TYLENOL) 325 MG tablet Take 2 tablets (650 mg total) by mouth every 6 (six) hours as needed for mild pain.  Marland Kitchen amLODipine (NORVASC) 10 MG tablet Take 1 tablet (10 mg total) by mouth daily.  Marland Kitchen aspirin 325 MG tablet Take 1 tablet (325 mg total) by mouth daily.  Marland Kitchen atorvastatin (LIPITOR) 80 MG tablet Take 1 tablet (80 mg total) by mouth daily at 6 PM.  .  carvedilol (COREG) 25 MG tablet Take 1 tablet (25 mg total) by mouth 2 (two) times daily with a meal.  . losartan (COZAAR) 25 MG tablet TAKE 1 TABLET (25 MG TOTAL) BY MOUTH DAILY.  . nicotine (NICODERM CQ - DOSED IN MG/24 HR) 7 mg/24hr patch Place 1 patch (7 mg total) onto the skin daily for 14 days.  . VENTOLIN HFA 108 (90 Base) MCG/ACT inhaler INHALE 2 PUFFS INTO THE LUNGS EVERY 6 (SIX) HOURS AS NEEDED FOR WHEEZING OR SHORTNESS OF BREATH.  . [DISCONTINUED] amLODipine (NORVASC) 10 MG tablet Take 1 tablet (10 mg total) by mouth daily.  . [DISCONTINUED] atorvastatin  (LIPITOR) 80 MG tablet Take 1 tablet (80 mg total) by mouth daily at 6 PM.  . [DISCONTINUED] carvedilol (COREG) 25 MG tablet Take 1 tablet (25 mg total) by mouth 2 (two) times daily with a meal.     Allergies:   Penicillins; Sulfonamide derivatives; Clonidine derivatives; Flagyl [metronidazole]; and Labetalol   Social History   Socioeconomic History  . Marital status: Single    Spouse name: Not on file  . Number of children: Not on file  . Years of education: Not on file  . Highest education level: Not on file  Occupational History  . Not on file  Social Needs  . Financial resource strain: Not on file  . Food insecurity:    Worry: Not on file    Inability: Not on file  . Transportation needs:    Medical: Not on file    Non-medical: Not on file  Tobacco Use  . Smoking status: Former Smoker    Packs/day: 0.50    Years: 10.00    Pack years: 5.00    Types: Cigarettes  . Smokeless tobacco: Never Used  Substance and Sexual Activity  . Alcohol use: No    Alcohol/week: 1.2 oz    Types: 2 Glasses of wine per week    Frequency: Never    Comment: 08/04/2017--states no   . Drug use: No  . Sexual activity: Yes    Birth control/protection: None  Lifestyle  . Physical activity:    Days per week: Not on file    Minutes per session: Not on file  . Stress: Not on file  Relationships  . Social connections:    Talks on phone: Not on file    Gets together: Not on file    Attends religious service: Not on file    Active member of club or organization: Not on file    Attends meetings of clubs or organizations: Not on file    Relationship status: Not on file  Other Topics Concern  . Not on file  Social History Narrative  . Not on file     Family History:  The patient's family history includes Asthma in her brother; Diabetes in her brother and sister; Hypertension in her mother.   ROS:   Please see the history of present illness.    Review of Systems  Constitution: Negative.    HENT: Negative.   Eyes: Negative.   Cardiovascular: Negative.   Respiratory: Negative.   Hematologic/Lymphatic: Negative.   Musculoskeletal: Negative.  Negative for joint pain.  Gastrointestinal: Negative.   Genitourinary: Negative.   Neurological: Negative.    All other systems reviewed and are negative.   PHYSICAL EXAM:   VS:  BP 132/72   Pulse 68   Ht 5\' 7"  (1.702 m)   Wt 177 lb (80.3 kg)   LMP 05/19/2013  BMI 27.72 kg/m   Physical Exam  GEN: Well nourished, well developed, in no acute distress  Neck: no JVD, carotid bruits, or masses Cardiac:RRR; positive S4 no murmurs, rubs, Respiratory:  clear to auscultation bilaterally, normal work of breathing GI: soft, nontender, nondistended, + BS Ext: without cyanosis, clubbing, or edema, Good distal pulses bilaterally Neuro:  Alert and Oriented x 3, Psych: euthymic mood, full affect  Wt Readings from Last 3 Encounters:  12/08/17 177 lb (80.3 kg)  11/17/17 172 lb (78 kg)  10/27/17 175 lb (79.4 kg)      Studies/Labs Reviewed:   EKG:  EKG is not ordered today.   Recent Labs: 07/20/2017: B Natriuretic Peptide 306.8; TSH 0.856 10/06/2017: ALT 11 10/08/2017: Magnesium 2.8 10/14/2017: BUN 20; Creatinine, Ser 1.36; Hemoglobin 8.3; Platelets 321; Potassium 4.3; Sodium 137   Lipid Panel    Component Value Date/Time   CHOL 187 07/19/2017 0556   TRIG 210 (H) 07/19/2017 0556   HDL 40 (L) 07/19/2017 0556   CHOLHDL 4.7 07/19/2017 0556   VLDL 42 (H) 07/19/2017 0556   LDLCALC 105 (H) 07/19/2017 0556    Additional studies/ records that were reviewed today include:  Echo 07/20/17: Study Conclusions   - Left ventricle: The cavity size was normal. There was severe   concentric hypertrophy. Systolic function was moderately to   severely reduced. The estimated ejection fraction was in the   range of 30% to 35%. Diffuse hypokinesis. Doppler parameters are   consistent with abnormal left ventricular relaxation (grade 1   diastolic  dysfunction). Doppler parameters are consistent with   elevated ventricular end-diastolic filling pressure. - Aortic valve: There was trivial regurgitation. - Aortic root: The aortic root was normal in size. - Mitral valve: There was mild regurgitation directed posteriorly. - Left atrium: The atrium was mildly dilated. - Right ventricle: The cavity size was normal. Wall thickness was   normal. Systolic function was normal. - Right atrium: The atrium was normal in size. - Tricuspid valve: There was mild regurgitation. - Pulmonic valve: There was trivial regurgitation. - Pulmonary arteries: Systolic pressure was within the normal   range. - Inferior vena cava: The vessel was normal in size. - Pericardium, extracardiac: There was no pericardial effusion.   Impressions:   - Severe concentric LVH with diffuse hypokinesis and LVEF 30-35%. A   cardiac MRI is recommended to differentiate hypertensive vs   infiltrative cardiomyopathy.   Cardiac cath: Procedures    RIGHT/LEFT HEART CATH AND CORONARY ANGIOGRAPHY  Conclusion     Multivessel coronary artery disease, with Syntax I score of 29.  Normal left main  Segmental calcified 85% mid LAD stenosis forming a [place] 011 bifurcation lesion with a large first diagonal.  LAD wraps around left ventricular apex and supplies collaterals to a small diffusely diseased PDA.  The circumflex is a codominant system with the right coronary and has severe disease in the first (90%) and second (70%) obtuse marginal branches beyond moderate to severe tortuosity.  The mid circumflex between the first and second obtuse marginals contains diffuse disease with up to 80% stenosis.  Right coronary is totally occluded proximally with distal continuation supplied by collaterals from the circumflex.  Inferior basal wall severe hypokinesis.  Overall normal EF of 55%.  Normal LVEDP.  Normal pulmonary artery pressures.  Pulmonary capillary wedge pressure 24.  LVEDP  is normal.   RECOMMENDATIONS:    Relatively young female with significant comorbidity and multivessel coronary disease with intermediate risk for PCI based  on Syntax I score.  Further uptitrate statin intensity  Evaluation for multivessel CABG.            ASSESSMENT:    1. S/P CABG x 5   2. Ischemic cardiomyopathy   3. History of CVA (cerebrovascular accident)   4. Essential hypertension   5. Chronic renal insufficiency, stage III (moderate) (HCC)   6. Smoker      PLAN:  In order of problems listed above:  CAD status post CABG times 3-2/20/25 complicated by AKI, EtOH withdrawal, hypertension was not on Cozaar with kidney disease.  But was sent home on this from the rehab center.  Will check renal function today.  Follow-up with Dr. Acie Fredrickson in 2 to 3 months.    Ischemic cardiomyopathy LVEF 30 to 35% but follow-up TEE EF 50 to 55% no evidence of heart failure on exam.  Advised to limit alcohol.  CVA 07/18/2017 secondary to severe hypertension treated with aspirin and statin by neurology cannot afford follow-up so is waiting for disability.  Essential hypertension blood pressure is stable but she is also on losartan.  Will check renal function today.  CKD patient was placed on losartan at the rehab center.  Will check kidneys today.  Also check CBC because of her anemia.  Smoker-had quit but back smoking 3-4/day.  Smoking cessation advised.  She is still on the nicotine patch 14 mg but takes it off when she smokes.  Medication Adjustments/Labs and Tests Ordered: Current medicines are reviewed at length with the patient today.  Concerns regarding medicines are outlined above.  Medication changes, Labs and Tests ordered today are listed in the Patient Instructions below. Patient Instructions  Medication Instructions:  Your physician has recommended you make the following change in your medication:  1.  HOLD the Losartan until you hear back from [place]  Labwork: TODAY:  CBC &  CMET  Testing/Procedures: None ordered  Follow-Up: Your physician recommends that you schedule a follow-up appointment in: 2-3 MONTHS WITH DR. Acie Fredrickson  Any Other Special Instructions Will Be Listed Below (If Applicable).   Steps to Quit Smoking Smoking tobacco can be bad for your health. It can also affect almost every organ in your body. Smoking puts you and people around you at risk for many serious long-lasting (chronic) diseases. Quitting smoking is hard, but it is one of the best things that you can do for your health. It is never too late to quit. What are the benefits of quitting smoking? When you quit smoking, you lower your risk for getting serious diseases and conditions. They can include:  Lung cancer or lung disease.  Heart disease.  Stroke.  Heart attack.  Not being able to have children (infertility).  Weak bones (osteoporosis) and broken bones (fractures).  If you have coughing, wheezing, and shortness of breath, those symptoms may get better when you quit. You may also get sick less often. If you are pregnant, quitting smoking can help to lower your chances of having a baby of low birth weight. What can I do to help me quit smoking? Talk with your doctor about what can help you quit smoking. Some things you can do (strategies) include:  Quitting smoking totally, instead of slowly cutting back how much you smoke over a period of time.  Going to in-person counseling. You are more likely to quit if you go to many counseling sessions.  Using resources and support systems, such as: ? Database administrator with a Social worker. ? Phone quitlines. ?  Printed Furniture conservator/restorer. ? Support groups or group counseling. ? Text messaging programs. ? Mobile phone apps or applications.  Taking medicines. Some of these medicines may have nicotine in them. If you are pregnant or breastfeeding, do not take any medicines to quit smoking unless your doctor says it is okay. Talk with your  doctor about counseling or other things that can help you.  Talk with your doctor about using more than one strategy at the same time, such as taking medicines while you are also going to in-person counseling. This can help make quitting easier. What things can I do to make it easier to quit? Quitting smoking might feel very hard at first, but there is a lot that you can do to make it easier. Take these steps:  Talk to your family and friends. Ask them to support and encourage you.  Call phone quitlines, reach out to support groups, or work with a Social worker.  Ask people who smoke to not smoke around you.  Avoid places that make you want (trigger) to smoke, such as: ? Bars. ? Parties. ? Smoke-break areas at work.  Spend time with people who do not smoke.  Lower the stress in your life. Stress can make you want to smoke. Try these things to help your stress: ? Getting regular exercise. ? Deep-breathing exercises. ? Yoga. ? Meditating. ? Doing a body scan. To do this, close your eyes, focus on one area of your body at a time from head to toe, and notice which parts of your body are tense. Try to relax the muscles in those areas.  Download or buy apps on your mobile phone or tablet that can help you stick to your quit plan. There are many free apps, such as QuitGuide from the [employer] Office manager for Disease Control and Prevention). You can find more support from smokefree.gov and other websites.  This information is not intended to replace advice given to you by your health care provider. Make sure you discuss any questions you have with your health care provider. Document Released: 05/02/2009 Document Revised: 03/03/2016 Document Reviewed: 11/20/2014 Elsevier Interactive Patient Education  [employer].    If you need a refill on your cardiac medications before your next appointment, please call your pharmacy.      Signed, Ermalinda Barrios, PA-C  12/08/2017 2:10 PM    [facility] [place], [place], [place]  [address] Phone: [phone]; Fax: [phone]

## 2017-12-08 NOTE — Progress Notes (Signed)
Subjective:    Patient ID: Karen Ochoa, female    DOB: 1966-05-22, 52 y.o.   MRN: [id number]  HPI Karen Ochoa, a 52 year old female with a history of CHF, CVA, HCN, CAD, and CKD3  presents accompanied by her husband for a follow-up of chronic conditions she has a history that is significant for CVA and uncontrolled hypertension. Karen Ochoa says that she has been taking medications consistently.   Patient has a history of CVA. She had a CVA on 07/18/2017.  Patient had a MRI which confirmed an acute-subacute ischemic pontine stroke.  She on statin and ASA therapy. She is scheduled to establish care with neurology. She has left sided weakness and continues to ambulate with a cane. Patient recently underwent CABG on 10/07/2017. Patient was admitted to cardiac rehab in [place], [place]. Patient is homeless and has been living in a motel with spouse with significant other.   Patient also has a history of stage III chronic kidney disease.  She is not under the care of nephrology.  Patient does not exercise routinely or follow a low-fat, low-sodium diet.  Karen Ochoa does not check blood pressures at home.  Continue to hold Cozzar due to CKD.    Past Medical History:  Diagnosis Date  . Asthma 2017  . Fibroids   . GERD (gastroesophageal reflux disease)   . Headache    "weekly" (07/20/2017)  . High cholesterol 07/18/2017  . Hx MRSA infection   . Hypertension   . Insomnia due to medical condition   . Stroke ([facility]) 07/18/2017  . TIA (transient ischemic attack)    "I've had several"   Social History   Socioeconomic History  . Marital status: Single    Spouse name: Not on file  . Number of children: Not on file  . Years of education: Not on file  . Highest education level: Not on file  Occupational History  . Not on file  Social Needs  . Financial resource strain: Not on file  . Food insecurity:    Worry: Not on file    Inability: Not on file  . Transportation needs:    Medical: Not on file     Non-medical: Not on file  Tobacco Use  . Smoking status: Former Smoker    Packs/day: 0.50    Years: 10.00    Pack years: 5.00    Types: Cigarettes  . Smokeless tobacco: Never Used  Substance and Sexual Activity  . Alcohol use: No    Alcohol/week: 1.2 oz    Types: 2 Glasses of wine per week    Frequency: Never    Comment: 08/04/2017--states no   . Drug use: No  . Sexual activity: Yes    Birth control/protection: None  Lifestyle  . Physical activity:    Days per week: Not on file    Minutes per session: Not on file  . Stress: Not on file  Relationships  . Social connections:    Talks on phone: Not on file    Gets together: Not on file    Attends religious service: Not on file    Active member of club or organization: Not on file    Attends meetings of clubs or organizations: Not on file    Relationship status: Not on file  . Intimate partner violence:    Fear of current or ex partner: Not on file    Emotionally abused: Not on file    Physically abused: Not on file  Forced sexual activity: Not on file  Other Topics Concern  . Not on file  Social History Narrative  . Not on file   Immunization History  Administered Date(s) Administered  . Pneumococcal Polysaccharide-23 08/04/2017  . Tdap 09/06/2017    Review of Systems  HENT: Negative.   Eyes: Negative.   Respiratory: Negative.   Cardiovascular: Negative.  Negative for leg swelling.  Gastrointestinal: Negative.   Endocrine: Negative for polydipsia, polyphagia and polyuria.  Genitourinary: Negative.   Musculoskeletal: Negative.   Neurological: Positive for facial asymmetry, weakness and numbness. Negative for dizziness.  Psychiatric/Behavioral: Positive for depression. The patient is nervous/anxious.        Objective:   Physical Exam  Constitutional: She is oriented to person, place, and time.  HENT:  Head: Normocephalic and atraumatic.  Right Ear: External ear normal.  Left Ear: External ear normal.   Nose: Nose normal.  Mouth/Throat: Oropharynx is clear and moist.  Eyes: Pupils are equal, round, and reactive to light. Conjunctivae and EOM are normal.  Neck: Normal range of motion. Neck supple.  Cardiovascular: Normal rate, regular rhythm, normal heart sounds and intact distal pulses.  Pulmonary/Chest: Effort normal and breath sounds normal.  Abdominal: Soft. Bowel sounds are normal.  Neurological: She is alert and oriented to person, place, and time. She displays tremor. Gait abnormal.  2/5 weakness to left upper extremity 3/5 weakness to left lower extremity   Skin: Skin is warm and dry.  Psychiatric: She has a normal mood and affect. Her behavior is normal. Thought content normal.      BP 136/82 (BP Location: Left Arm, Patient Position: Sitting, Cuff Size: Normal)   Pulse 68   Temp 98.3 F (36.8 C) (Oral)   Resp 16   Ht 5\' 7"  (1.702 m)   Wt 177 lb (80.3 kg)   LMP 05/19/2013   SpO2 100%   BMI 27.72 kg/m  Assessment & Plan:  1. Insomnia, unspecified type - traZODone (DESYREL) 50 MG tablet; Take 0.5 tablets (25 mg total) by mouth at bedtime as needed for sleep.  Dispense: 30 tablet; Refill: 2  2. History of TIAs Continue Statin and ASA therapy as prescribed.   3. Coronary artery disease involving native heart without angina pectoris, unspecified vessel or lesion type Continue Coreg 12.5 mg BID as prescribed by cardiology  4. Tobacco dependence - nicotine (NICODERM CQ - DOSED IN MG/24 HR) 7 mg/24hr patch; Place 1 patch (7 mg total) onto the skin daily for 14 days.  Dispense: 28 patch; Refill: 1  Patient was in a hurry due to transportation constraints.  She left abruptly at the end of appointment.  No labs obtained.  All medications sent electronically to pharmacy. RTC: 3 months for chronic conditions  Donia Pounds  MSN, FNP-C [facility] [address] [place], [place] [address] [phone]

## 2017-12-08 NOTE — Patient Instructions (Addendum)
Medication Instructions:  Your physician has recommended you make the following change in your medication:  1.  HOLD the Losartan until you hear back from [place]  Labwork: TODAY:  CBC & CMET  Testing/Procedures: None ordered  Follow-Up: Your physician recommends that you schedule a follow-up appointment in: 2-3 MONTHS WITH DR. Acie Fredrickson  Any Other Special Instructions Will Be Listed Below (If Applicable).   Steps to Quit Smoking Smoking tobacco can be bad for your health. It can also affect almost every organ in your body. Smoking puts you and people around you at risk for many serious long-lasting (chronic) diseases. Quitting smoking is hard, but it is one of the best things that you can do for your health. It is never too late to quit. What are the benefits of quitting smoking? When you quit smoking, you lower your risk for getting serious diseases and conditions. They can include:  Lung cancer or lung disease.  Heart disease.  Stroke.  Heart attack.  Not being able to have children (infertility).  Weak bones (osteoporosis) and broken bones (fractures).  If you have coughing, wheezing, and shortness of breath, those symptoms may get better when you quit. You may also get sick less often. If you are pregnant, quitting smoking can help to lower your chances of having a baby of low birth weight. What can I do to help me quit smoking? Talk with your doctor about what can help you quit smoking. Some things you can do (strategies) include:  Quitting smoking totally, instead of slowly cutting back how much you smoke over a period of time.  Going to in-person counseling. You are more likely to quit if you go to many counseling sessions.  Using resources and support systems, such as: ? Database administrator with a Social worker. ? Phone quitlines. ? Careers information officer. ? Support groups or group counseling. ? Text messaging programs. ? Mobile phone apps or applications.  Taking  medicines. Some of these medicines may have nicotine in them. If you are pregnant or breastfeeding, do not take any medicines to quit smoking unless your doctor says it is okay. Talk with your doctor about counseling or other things that can help you.  Talk with your doctor about using more than one strategy at the same time, such as taking medicines while you are also going to in-person counseling. This can help make quitting easier. What things can I do to make it easier to quit? Quitting smoking might feel very hard at first, but there is a lot that you can do to make it easier. Take these steps:  Talk to your family and friends. Ask them to support and encourage you.  Call phone quitlines, reach out to support groups, or work with a Social worker.  Ask people who smoke to not smoke around you.  Avoid places that make you want (trigger) to smoke, such as: ? Bars. ? Parties. ? Smoke-break areas at work.  Spend time with people who do not smoke.  Lower the stress in your life. Stress can make you want to smoke. Try these things to help your stress: ? Getting regular exercise. ? Deep-breathing exercises. ? Yoga. ? Meditating. ? Doing a body scan. To do this, close your eyes, focus on one area of your body at a time from head to toe, and notice which parts of your body are tense. Try to relax the muscles in those areas.  Download or buy apps on your mobile phone or tablet that can  help you stick to your quit plan. There are many free apps, such as QuitGuide from the [employer] Office manager for Disease Control and Prevention). You can find more support from smokefree.gov and other websites.  This information is not intended to replace advice given to you by your health care provider. Make sure you discuss any questions you have with your health care provider. Document Released: 05/02/2009 Document Revised: 03/03/2016 Document Reviewed: 11/20/2014 Elsevier Interactive Patient Education  [employer].    If you need a refill on your cardiac medications before your next appointment, please call your pharmacy.

## 2017-12-09 LAB — COMPREHENSIVE METABOLIC PANEL
ALT: 13 IU/L (ref 0–32)
AST: 15 IU/L (ref 0–40)
Albumin/Globulin Ratio: 1.5 (ref 1.2–2.2)
Albumin: 4.3 g/dL (ref 3.5–5.5)
Alkaline Phosphatase: 153 IU/L — ABNORMAL HIGH (ref 39–117)
BILIRUBIN TOTAL: 0.3 mg/dL (ref 0.0–1.2)
BUN/Creatinine Ratio: 16 (ref 9–23)
BUN: 21 mg/dL (ref 6–24)
CALCIUM: 9.1 mg/dL (ref 8.7–10.2)
CHLORIDE: 107 mmol/L — AB (ref 96–106)
CO2: 21 mmol/L (ref 20–29)
Creatinine, Ser: 1.33 mg/dL — ABNORMAL HIGH (ref 0.57–1.00)
GFR, EST AFRICAN AMERICAN: 52 mL/min/{1.73_m2} — AB (ref >59)
GFR, EST NON AFRICAN AMERICAN: 45 mL/min/{1.73_m2} — AB (ref >59)
Globulin, Total: 2.9 g/dL (ref 1.5–4.5)
Glucose: 100 mg/dL — ABNORMAL HIGH (ref 65–99)
Potassium: 4.3 mmol/L (ref 3.5–5.2)
Sodium: 142 mmol/L (ref 134–144)
TOTAL PROTEIN: 7.2 g/dL (ref 6.0–8.5)

## 2017-12-09 LAB — CBC
HEMATOCRIT: 35.3 % (ref 34.0–46.6)
HEMOGLOBIN: 11.7 g/dL (ref 11.1–15.9)
MCH: 27.3 pg (ref 26.6–33.0)
MCHC: 33.1 g/dL (ref 31.5–35.7)
MCV: 83 fL (ref 79–97)
Platelets: 257 10*3/uL (ref 150–450)
RBC: 4.28 x10E6/uL (ref 3.77–5.28)
RDW: 17.1 % — ABNORMAL HIGH (ref 12.3–15.4)
WBC: 7.6 10*3/uL (ref 3.4–10.8)

## 2018-01-26 ENCOUNTER — Other Ambulatory Visit: Payer: Self-pay

## 2018-02-14 ENCOUNTER — Ambulatory Visit: Payer: Self-pay | Admitting: Cardiovascular Disease

## 2018-02-24 ENCOUNTER — Other Ambulatory Visit: Payer: Self-pay | Admitting: Physician Assistant

## 2018-03-10 ENCOUNTER — Encounter: Payer: Self-pay | Admitting: Family Medicine

## 2018-03-10 ENCOUNTER — Ambulatory Visit (INDEPENDENT_AMBULATORY_CARE_PROVIDER_SITE_OTHER): Payer: Medicaid Other | Admitting: Family Medicine

## 2018-03-10 VITALS — BP 127/88 | HR 71 | Temp 97.7°F | Resp 14 | Ht 67.0 in | Wt 180.0 lb

## 2018-03-10 DIAGNOSIS — J449 Chronic obstructive pulmonary disease, unspecified: Secondary | ICD-10-CM

## 2018-03-10 DIAGNOSIS — I63019 Cerebral infarction due to thrombosis of unspecified vertebral artery: Secondary | ICD-10-CM

## 2018-03-10 DIAGNOSIS — R531 Weakness: Secondary | ICD-10-CM

## 2018-03-10 DIAGNOSIS — G4700 Insomnia, unspecified: Secondary | ICD-10-CM

## 2018-03-10 MED ORDER — ALBUTEROL SULFATE HFA 108 (90 BASE) MCG/ACT IN AERS: INHALATION_SPRAY | RESPIRATORY_TRACT | 2 refills | Status: DC

## 2018-03-10 MED ORDER — TRAZODONE HCL 50 MG PO TABS: 25.0000 mg | ORAL_TABLET | Freq: Every evening | ORAL | 2 refills | Status: DC | PRN

## 2018-03-10 NOTE — Patient Instructions (Signed)
Rehabilitation After a Stroke, Adult A stroke causes damage to the brain cells, which can affect your ability to walk, talk, or remember things. The impact of a stroke is different for everyone, and so is recovery. Some people have progress during the first few days after treatment. Others may take weeks or longer to make progress. Stroke rehabilitation includes a variety of treatments to help you recover and promote your independence after a stroke. You may not be able do everything that you did before the stroke, but you can learn ways to manage your lifestyle and be as independent as possible. Rehabilitation will start as soon as you are able to participate after your stroke, and it involves care from a team that may include:  Family and friends. Your loved ones know you best and can be very helpful in your recovery.  Physicians.  Nurses.  Physical and occupational therapists.  Speech-language therapists.  A nutritionist.  A psychologist.  A social worker.  Keep open communication with all members of your care team. Share your medical records if needed, and take notes about each provider's recommendations. What is physical therapy? Physical therapists (PTs) help you to improve your coordination, balance, and muscle strength. Physical therapy may involve:  Range of motion exercises.  Help to move between lying, sitting, and standing positions.  Walking with a cane or walker, if needed.  Help using stairs.  What is occupational therapy? Occupational therapists (OTs) help you rebuild your ability to do everyday tasks, such as brushing your teeth, going to the bathroom, eating, and getting dressed. Occupational therapy may also help with:  Vision. Visual scanning is a technique that is used to prevent falls.  Memory and cognitive training. This therapy includes problem-solving techniques and relearning tasks like making a phone call.  Fine muscle movements such as buttoning a  shirt or picking up small objects.  What is speech therapy? Speech-language therapists help you communicate. After a stroke, you may have problems understanding what people are saying, or you may have trouble writing, speaking, or finding the right word for what you want to say. You may also need speech therapy if you have difficulty swallowing while eating and drinking. Examples of speech-language therapies include:  Techniques to strengthen muscles used in swallowing.  Naming objects or describing pictures. This helps retrain the brain to recognize and remember words.  Exercises to strengthen the muscles involved in talking, including your tongue and lips.  Exercises to retrain your brain in understanding what you read and hear.  How often will I need therapy? Therapy will begin as soon as you are able to participate, which is often within the first few days after a stroke. Sessions will be frequent at first. For example, you may have therapy 2-3 hours a day on most days of the week during the first few months. The intensity depends on the type and severity of your stroke. You may need therapy for several months. Therapy may take place in the hospital, at a rehabilitation center, or in your home. Are there any side effects of therapy? Therapy is safe and is usually well-tolerated. You may feel physically and mentally tired after therapy, especially during the first few weeks. Rest before therapy sessions if you need to so you can get the most out of your rehabilitation. Follow these instructions at home:  Involve your family and friends in your recovery, if possible. Having another person to encourage you is beneficial.  Follow instructions from your speech-language therapist,   nutritionist, or health care provider about what you can safely eat and drink. Eat healthy foods. If your ability to swallow was affected by the stroke, you may need to take steps to avoid choking, such as: ? Taking  small bites when eating. ? Eating foods that are soft or pureed. ? Drinking liquids that have been thickened.  Maintain social connections and interactions with friends, family, and community groups. This is an important part of your recovery. Communication challenges and physical challenges may cause you to feel isolated after a stroke.  Consider joining a support group that allows you to talk about the impact of stroke on your life. A psychologist or counselor may be recommended. Your emotional recovery from stroke is just as important as your physical recovery.  Keep all follow-up visits as told by your health care providers. This is important. Summary  Stroke rehabilitation includes a variety of treatments to help you recover and promote your independence after a stroke.  Rehabilitation will start as soon as you are able to participate after your stroke, and it includes care from a team of experts.  The intensity of therapy depends on the type and severity of your stroke. You may need therapy for several months. This information is not intended to replace advice given to you by your health care provider. Make sure you discuss any questions you have with your health care provider. Document Released: 07/26/2007 Document Revised: 07/07/2016 Document Reviewed: 07/07/2016 Elsevier Interactive Patient Education  2018 Elsevier Inc.  

## 2018-03-10 NOTE — Progress Notes (Signed)
   [facility]  Chronic Disease Follow Up Provider: Lanae Boast, FNP  SUBJECTIVE:  Patient presents for follow up for the following  chronic conditions. 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 Patient continues to have left sided weakness. Patient states that she has applied for SSI and found out she was denied today. Will get an attorney for the appeal.  Patient states that she is stressed due to finances and looking for a place to stay. Her husband is currently the sole provider.  Patient denies chest pain, shortness of breath, leg swelling or dizziness.  Is being followed by cardiology. Neurology referral pending.    Review of Systems  Constitutional: Negative.   HENT: Negative.   Eyes: Negative.   Respiratory: Negative.   Cardiovascular: Negative.   Gastrointestinal: Negative.   Genitourinary: Negative.   Musculoskeletal: Positive for myalgias (left side).  Skin: Negative.   Neurological: Positive for weakness (left sided).  Psychiatric/Behavioral: Negative.     OBJECTIVE:  BP 127/88 (BP Location: Left Arm, Patient Position: Sitting, Cuff Size: Normal)   Pulse 71   Temp 97.7 F (36.5 C) (Oral)   Resp 14   Ht 5\' 7"  (1.702 m)   Wt 180 lb (81.6 kg)   LMP 05/19/2013   SpO2 100%   BMI 28.19 kg/m   Physical Exam  Constitutional: She is oriented to person, place, and time and well-developed, well-nourished, and in no distress. No distress.  HENT:  Head: Normocephalic and atraumatic.  Eyes: Pupils are equal, round, and reactive to light. Conjunctivae and EOM are normal.  Neck: Normal range of motion. Neck supple.  Cardiovascular: Normal rate, regular rhythm and intact distal pulses. Exam reveals no gallop and no friction rub.  No murmur heard. Pulmonary/Chest: Effort normal and breath sounds normal. No respiratory distress. She has no wheezes.  Abdominal: Soft. Bowel sounds are normal. There is no  tenderness.  Musculoskeletal: She exhibits no edema.  Lymphadenopathy:    She has no cervical adenopathy.  Neurological: She is alert and oriented to person, place, and time. She exhibits abnormal muscle tone. Gait normal. Coordination abnormal.  Left sided weakness noted. Ambulating with assistance of a cane.   Skin: Skin is warm and dry.  Psychiatric: Mood, memory, affect and judgment normal.  Nursing note and vitals reviewed.    ASSESSMENT/PLAN:  1. Insomnia, unspecified type The current medical regimen is effective;  continue present plan and medications. - traZODone (DESYREL) 50 MG tablet; Take 0.5 tablets (25 mg total) by mouth at bedtime as needed for sleep.  Dispense: 30 tablet; Refill: 2  2. Chronic obstructive pulmonary disease, unspecified COPD type ([place]) Continue with current medications - albuterol (VENTOLIN HFA) 108 (90 Base) MCG/ACT inhaler; INHALE 2 PUFFS INTO THE LUNGS EVERY 6 (SIX) HOURS AS NEEDED FOR WHEEZING OR SHORTNESS OF BREATH.  Dispense: 18 g; Refill: 2  3. Left-sided weakness - Ambulatory referral to Neurology  4. 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    Return to care as scheduled and prn. Patient verbalized understanding and agreed with plan of care.   Ms. Doug Sou. Nathaneil Canary, FNP-BC [facility] [address] [place],  [address] [phone]

## 2018-03-11 ENCOUNTER — Telehealth: Payer: Self-pay

## 2018-03-11 ENCOUNTER — Other Ambulatory Visit: Payer: Self-pay | Admitting: Family Medicine

## 2018-03-11 MED ORDER — IBUPROFEN 800 MG PO TABS: 800.0000 mg | ORAL_TABLET | Freq: Three times a day (TID) | ORAL | 0 refills | Status: DC | PRN

## 2018-03-11 NOTE — Telephone Encounter (Signed)
Please send in rx for ibuprofen 800mg  to [facility] on [address]. Thanks!

## 2018-03-11 NOTE — Telephone Encounter (Signed)
Sent!

## 2018-03-29 ENCOUNTER — Encounter: Payer: Self-pay | Admitting: Cardiology

## 2018-03-29 ENCOUNTER — Ambulatory Visit (INDEPENDENT_AMBULATORY_CARE_PROVIDER_SITE_OTHER): Payer: Medicaid Other | Admitting: Cardiology

## 2018-03-29 VITALS — BP 130/84 | HR 63 | Ht 67.0 in | Wt 184.1 lb

## 2018-03-29 DIAGNOSIS — I255 Ischemic cardiomyopathy: Secondary | ICD-10-CM | POA: Diagnosis not present

## 2018-03-29 DIAGNOSIS — I10 Essential (primary) hypertension: Secondary | ICD-10-CM

## 2018-03-29 DIAGNOSIS — E785 Hyperlipidemia, unspecified: Secondary | ICD-10-CM

## 2018-03-29 DIAGNOSIS — N2889 Other specified disorders of kidney and ureter: Secondary | ICD-10-CM

## 2018-03-29 DIAGNOSIS — N183 Chronic kidney disease, stage 3 unspecified: Secondary | ICD-10-CM

## 2018-03-29 DIAGNOSIS — F17200 Nicotine dependence, unspecified, uncomplicated: Secondary | ICD-10-CM

## 2018-03-29 DIAGNOSIS — I63019 Cerebral infarction due to thrombosis of unspecified vertebral artery: Secondary | ICD-10-CM | POA: Diagnosis not present

## 2018-03-29 DIAGNOSIS — I2510 Atherosclerotic heart disease of native coronary artery without angina pectoris: Secondary | ICD-10-CM

## 2018-03-29 NOTE — Patient Instructions (Addendum)
Medication Instructions: Your physician recommends that you continue on your current medications as directed. Please refer to the Current Medication list given to you today.   Labwork: TODAY: BMET, LIPIDS  Procedures/Testing: None  Follow-Up: Your physician recommends that you schedule a follow-up appointment in: 3-4 months with Dr. Acie Fredrickson   Any Additional Special Instructions Will Be Listed Below (If Applicable).  Begin exercise by walking 5 minutes twice a day and work up to 30 minutes per day.  Do sit to stand exercises as we discussed     Sit-to-Stand Exercise The sit-to-stand exercise (also known as the chair stand or chair rise exercise) strengthens your lower body and helps you maintain or improve your mobility and independence. The goal is to do the sit-to-stand exercise without using your hands. This will be easier as you become stronger. You should always talk with your health care provider before starting any exercise program, especially if you have had recent surgery. Do the exercise exactly as told by your health care provider and adjust it as directed. It is normal to feel mild stretching, pulling, tightness, or discomfort as you do this exercise, but you should stop right away if you feel sudden pain or your pain gets worse. Do not begin doing this exercise until told by your health care provider. What the sit-to-stand exercise does The sit-to-stand exercise helps to strengthen the muscles in your thighs and the muscles in the center of your body that give you stability (core muscles). This exercise is especially helpful if:  You have had knee or hip surgery.  You have trouble getting up from a chair, out of a car, or off the toilet.  How to do the sit-to-stand exercise 1. Sit toward the front edge of a sturdy chair without armrests. Your knees should be bent and your feet should be flat on the floor and shoulder-width apart. 2. Place your hands lightly on each side  of the seat. Keep your back and neck as straight as possible, with your chest slightly forward. 3. Breathe in slowly. Lean forward and slightly shift your weight to the front of your feet. 4. Breathe out as you slowly stand up. Use your hands as little as possible. 5. Stand and pause for a full breath in and out. 6. Breathe in as you sit down slowly. Tighten your core and abdominal muscles to control your lowering as much as possible. 7. Breathe out slowly. 8. Do this exercise 10-15 times. If needed, do it fewer times until you build up strength. 9. Rest for 1 minute, then do another set of 10-15 repetitions. To change the difficulty of the sit-to-stand exercise  If the exercise is too difficult, use a chair with sturdy armrests, and push off the armrests to help you come to the standing position. You can also use the armrests to help slowly lower yourself back to sitting. As this gets easier, try to use your arms less. You can also place a firm cushion or pillow on the chair to make the surface higher.  If this exercise is too easy, do not use your arms to help raise or lower yourself. You can also wear a weighted vest, use hand weights, increase your repetitions, or try a lower chair. General tips  You may feel tired when starting an exercise routine. This is normal.  You may have muscle soreness that lasts a few days. This is normal. As you get stronger, you may not feel muscle soreness.  Use smooth,  steady movements.  Do not  hold your breath during strength exercises. This can cause unsafe changes in your blood pressure.  Breathe in slowly through your nose, and breathe out slowly through your mouth. Summary  Strengthening your lower body is an important step to help you move safely and independently.  The sit-to-stand exercise helps strengthen the muscles in your thighs and core.  You should always talk with your health care provider before starting any exercise program,  especially if you have had recent surgery. This information is not intended to replace advice given to you by your health care provider. Make sure you discuss any questions you have with your health care provider. Document Released: 08/27/2016 Document Revised: 08/27/2016 Document Reviewed: 08/27/2016 Elsevier Interactive Patient Education  2018 [place] DASH stands for "Dietary Approaches to Stop Hypertension." The DASH eating plan is a healthy eating plan that has been shown to reduce high blood pressure (hypertension). It may also reduce your risk for type 2 diabetes, heart disease, and stroke. The DASH eating plan may also help with weight loss. What are tips for following this plan? General guidelines  Avoid eating more than 2,300 mg (milligrams) of salt (sodium) a day. If you have hypertension, you may need to reduce your sodium intake to 1,500 mg a day.  Limit alcohol intake to no more than 1 drink a day for nonpregnant women and 2 drinks a day for men. One drink equals 12 oz of beer, 5 oz of wine, or 1 oz of hard liquor.  Work with your health care provider to maintain a healthy body weight or to lose weight. Ask what an ideal weight is for you.  Get at least 30 minutes of exercise that causes your heart to beat faster (aerobic exercise) most days of the week. Activities may include walking, swimming, or biking.  Work with your health care provider or diet and nutrition specialist (dietitian) to adjust your eating plan to your individual calorie needs. Reading food labels  Check food labels for the amount of sodium per serving. Choose foods with less than 5 percent of the Daily Value of sodium. Generally, foods with less than 300 mg of sodium per serving fit into this eating plan.  To find whole grains, look for the word "whole" as the first word in the ingredient list. Shopping  Buy products labeled as "low-sodium" or "no salt added."  Buy fresh  foods. Avoid canned foods and premade or frozen meals. Cooking  Avoid adding salt when cooking. Use salt-free seasonings or herbs instead of table salt or sea salt. Check with your health care provider or pharmacist before using salt substitutes.  Do not fry foods. Cook foods using healthy methods such as baking, boiling, grilling, and broiling instead.  Cook with heart-healthy oils, such as olive, canola, soybean, or sunflower oil. Meal planning   Eat a balanced diet that includes: ? 5 or more servings of fruits and vegetables each day. At each meal, try to fill half of your plate with fruits and vegetables. ? Up to 6-8 servings of whole grains each day. ? Less than 6 oz of lean meat, poultry, or fish each day. A 3-oz serving of meat is about the same size as a deck of cards. One egg equals 1 oz. ? 2 servings of low-fat dairy each day. ? A serving of nuts, seeds, or beans 5 times each week. ? Heart-healthy fats. Healthy fats called Omega-3 fatty acids  are found in foods such as flaxseeds and coldwater fish, like sardines, salmon, and mackerel.  Limit how much you eat of the following: ? Canned or prepackaged foods. ? Food that is high in trans fat, such as fried foods. ? Food that is high in saturated fat, such as fatty meat. ? Sweets, desserts, sugary drinks, and other foods with added sugar. ? Full-fat dairy products.  Do not salt foods before eating.  Try to eat at least 2 vegetarian meals each week.  Eat more home-cooked food and less restaurant, buffet, and fast food.  When eating at a restaurant, ask that your food be prepared with less salt or no salt, if possible. What foods are recommended? The items listed may not be a complete list. Talk with your dietitian about what dietary choices are best for you. Grains Whole-grain or whole-wheat bread. Whole-grain or whole-wheat pasta. Brown rice. [place]. Bulgur. Whole-grain and low-sodium cereals. Pita bread. Low-fat,  low-sodium crackers. Whole-wheat flour tortillas. Vegetables Fresh or frozen vegetables (raw, steamed, roasted, or grilled). Low-sodium or reduced-sodium tomato and vegetable juice. Low-sodium or reduced-sodium tomato sauce and tomato paste. Low-sodium or reduced-sodium canned vegetables. Fruits All fresh, dried, or frozen fruit. Canned fruit in natural juice (without added sugar). Meat and other protein foods Skinless chicken or [place]. Ground chicken or [place]. Pork with fat trimmed off. Fish and seafood. Egg whites. Dried beans, peas, or lentils. Unsalted nuts, nut butters, and seeds. Unsalted canned beans. Lean cuts of beef with fat trimmed off. Low-sodium, lean deli meat. Dairy Low-fat (1%) or fat-free (skim) milk. Fat-free, low-fat, or reduced-fat cheeses. Nonfat, low-sodium ricotta or cottage cheese. Low-fat or nonfat yogurt. Low-fat, low-sodium cheese. Fats and oils Soft margarine without trans fats. Vegetable oil. Low-fat, reduced-fat, or light mayonnaise and salad dressings (reduced-sodium). Canola, safflower, olive, soybean, and sunflower oils. Avocado. Seasoning and other foods Herbs. Spices. Seasoning mixes without salt. Unsalted popcorn and pretzels. Fat-free sweets. What foods are not recommended? The items listed may not be a complete list. Talk with your dietitian about what dietary choices are best for you. Grains Baked goods made with fat, such as croissants, muffins, or some breads. Dry pasta or rice meal packs. Vegetables Creamed or fried vegetables. Vegetables in a cheese sauce. Regular canned vegetables (not low-sodium or reduced-sodium). Regular canned tomato sauce and paste (not low-sodium or reduced-sodium). Regular tomato and vegetable juice (not low-sodium or reduced-sodium). Angie Fava. Olives. Fruits Canned fruit in a light or heavy syrup. Fried fruit. Fruit in cream or butter sauce. Meat and other protein foods Fatty cuts of meat. Ribs. Fried meat. Berniece Salines. Sausage.  Bologna and other processed lunch meats. Salami. Fatback. Hotdogs. Bratwurst. Salted nuts and seeds. Canned beans with added salt. Canned or smoked fish. Whole eggs or egg yolks. Chicken or [place] with skin. Dairy Whole or 2% milk, cream, and half-and-half. Whole or full-fat cream cheese. Whole-fat or sweetened yogurt. Full-fat cheese. Nondairy creamers. Whipped toppings. Processed cheese and cheese spreads. Fats and oils Butter. Stick margarine. Lard. Shortening. Ghee. Bacon fat. Tropical oils, such as coconut, palm kernel, or palm oil. Seasoning and other foods Salted popcorn and pretzels. Onion salt, garlic salt, seasoned salt, table salt, and sea salt. Worcestershire sauce. Tartar sauce. Barbecue sauce. Teriyaki sauce. Soy sauce, including reduced-sodium. Steak sauce. Canned and packaged gravies. Fish sauce. Oyster sauce. Cocktail sauce. Horseradish that you find on the shelf. Ketchup. Mustard. Meat flavorings and tenderizers. Bouillon cubes. Hot sauce and Tabasco sauce. Premade or packaged marinades. Premade or packaged taco  seasonings. Relishes. Regular salad dressings. Where to find more information:  National Heart, Lung, and [place]: https://wilson-eaton.com/  American Heart Association: www.heart.org Summary  The DASH eating plan is a healthy eating plan that has been shown to reduce high blood pressure (hypertension). It may also reduce your risk for type 2 diabetes, heart disease, and stroke.  With the DASH eating plan, you should limit salt (sodium) intake to 2,300 mg a day. If you have hypertension, you may need to reduce your sodium intake to 1,500 mg a day.  When on the DASH eating plan, aim to eat more fresh fruits and vegetables, whole grains, lean proteins, low-fat dairy, and heart-healthy fats.  Work with your health care provider or diet and nutrition specialist (dietitian) to adjust your eating plan to your individual calorie needs. This information is not intended to  replace advice given to you by your health care provider. Make sure you discuss any questions you have with your health care provider. Document Released: 06/25/2011 Document Revised: 06/29/2016 Document Reviewed: 06/29/2016 Elsevier Interactive Patient Education  [employer].    If you need a refill on your cardiac medications before your next appointment, please call your pharmacy.

## 2018-03-29 NOTE — Progress Notes (Signed)
Cardiology Office Note:    Date:  03/29/2018   ID:  Nelva Bush, DOB [birth year], MRN [id number]  PCP:  Lanae Boast, [place]  Cardiologist:  Mertie Moores, MD  Referring MD: Lanae Boast, FNP   Chief Complaint  Patient presents with  . Follow-up    CABG    History of Present Illness:    AMARACHUKWU LAKATOS is a 52 y.o. female with a past medical history significant for CVA 07/18/17 associated with severe hypertension treated with aspirin and statin by neurology, TIAs, CKD stage III and tobacco abuse, EtOH presented to the hospital with unstable angina. 2D echo in 07/2017 LVEF 30-35% with global hypokinesis. Cardiac cath3/19/19 showed three-vessel CAD EF 55% with inferior hypokinesis. She underwent CABG K0,9/38/18 complicated by alcohol withdrawal and hypertension. Cozaar was held because of elevated creatinine. Last creatinine 1.36 on 10/14/17 hemoglobin 8.3. She was discharged to a rehab [place], [place].   She was last seen by Estella Husk, PA on 12/08/2017. At the time she was homeless, living in a motel and working on trying to get disability related to her stroke. She had previously stopped drinking any alcohol or smoking- using nicotine patch. At f/u on 5/22 she had started back on both.   She is here today with her husband- got married to her significant other since last seen. She is living with her in-laws in a small place and is looking for somewhere else to go. She is seeing a Engineering geologist to work on disability. She now has medicaid. She is still has left sided weakness, numbness and tingling. SHe has difficulty doing fine tasks with her left hand.    She is still having some chest pain, tightness of the skin around her sternal incision. The incision looks good, healing well. She is not doing any exercise due to living situation. Her husband says that she sits all day or lays in the bed. She uses a cane to walk and appears very deconditioned. She attended rehab  but says they only got her able to walk but not much more than that.  She is trying to use nicotine patches and is only smoking only 1-2 cigarettes per day.   She says that they found MS on her brain scan. She has follow up with neurology in October.    Past Medical History:  Diagnosis Date  . Asthma 2017  . Fibroids   . GERD (gastroesophageal reflux disease)   . Headache    "weekly" (07/20/2017)  . High cholesterol 07/18/2017  . Hx MRSA infection   . Hypertension   . Insomnia due to medical condition   . Stroke ([place]) 07/18/2017  . TIA (transient ischemic attack)    "I've had several"    Past Surgical History:  Procedure Laterality Date  . CORONARY ARTERY BYPASS GRAFT N/A 10/07/2017   Procedure: CORONARY ARTERY BYPASS GRAFTING (CABG) ON PUMP TIMES 5 USING LEFT INTERNAL MAMMARY ARTERY AND BILATERAL GREATER SAPHENOUS VEIN VIA ENDOHARDEST.;  Surgeon: Gaye Pollack, MD;  Location: [facility];  Service: Open Heart Surgery;  Laterality: N/A;  . RIGHT/LEFT HEART CATH AND CORONARY ANGIOGRAPHY N/A 10/05/2017   Procedure: RIGHT/LEFT HEART CATH AND CORONARY ANGIOGRAPHY;  Surgeon: Belva Crome, MD;  Location: [facility];  Service: Cardiovascular;  Laterality: N/A;  . TEE WITHOUT CARDIOVERSION N/A 10/07/2017   Procedure: TRANSESOPHAGEAL ECHOCARDIOGRAM (TEE);  Surgeon: Gaye Pollack, MD;  Location: [facility];  Service: Open Heart Surgery;  Laterality: N/A;  . WISDOM  TOOTH EXTRACTION      Current Medications: Current Meds  Medication Sig  . acetaminophen (TYLENOL) 325 MG tablet Take 2 tablets (650 mg total) by mouth every 6 (six) hours as needed for mild pain.  Marland Kitchen albuterol (VENTOLIN HFA) 108 (90 Base) MCG/ACT inhaler INHALE 2 PUFFS INTO THE LUNGS EVERY 6 (SIX) HOURS AS NEEDED FOR WHEEZING OR SHORTNESS OF BREATH.  Marland Kitchen amLODipine (NORVASC) 10 MG tablet Take 1 tablet (10 mg total) by mouth daily.  Marland Kitchen aspirin 325 MG tablet Take 1 tablet (325 mg total) by mouth daily.  Marland Kitchen atorvastatin (LIPITOR) 80 MG  tablet TAKE 1 TABLET BY MOUTH EVERY DAY AT 6 PM  . carvedilol (COREG) 25 MG tablet Take 1 tablet (25 mg total) by mouth 2 (two) times daily with a meal.  . ibuprofen (ADVIL,MOTRIN) 800 MG tablet Take 1 tablet (800 mg total) by mouth every 8 (eight) hours as needed.  Marland Kitchen losartan (COZAAR) 25 MG tablet TAKE 1 TABLET BY MOUTH ONCE DAILY  . oxyCODONE (OXY IR/ROXICODONE) 5 MG immediate release tablet Take 1 tablet (5 mg total) by mouth every 6 (six) hours as needed for severe pain.  . traZODone (DESYREL) 50 MG tablet Take 0.5 tablets (25 mg total) by mouth at bedtime as needed for sleep.     Allergies:   Penicillins; Sulfonamide derivatives; Clonidine derivatives; Flagyl [metronidazole]; and Labetalol   Social History   Socioeconomic History  . Marital status: Single    Spouse name: Not on file  . Number of children: Not on file  . Years of education: Not on file  . Highest education level: Not on file  Occupational History  . Not on file  Social Needs  . Financial resource strain: Not on file  . Food insecurity:    Worry: Not on file    Inability: Not on file  . Transportation needs:    Medical: Not on file    Non-medical: Not on file  Tobacco Use  . Smoking status: Former Smoker    Packs/day: 0.50    Years: 10.00    Pack years: 5.00    Types: Cigarettes  . Smokeless tobacco: Never Used  Substance and Sexual Activity  . Alcohol use: No    Alcohol/week: 2.0 standard drinks    Types: 2 Glasses of wine per week    Frequency: Never    Comment: 08/04/2017--states no   . Drug use: No  . Sexual activity: Yes    Birth control/protection: None  Lifestyle  . Physical activity:    Days per week: Not on file    Minutes per session: Not on file  . Stress: Not on file  Relationships  . Social connections:    Talks on phone: Not on file    Gets together: Not on file    Attends religious service: Not on file    Active member of club or organization: Not on file    Attends meetings of  clubs or organizations: Not on file    Relationship status: Not on file  Other Topics Concern  . Not on file  Social History Narrative  . Not on file     Family History: The patient's family history includes Asthma in her brother; Diabetes in her brother and sister; Hypertension in her mother. There is no history of Anesthesia problems. ROS:   Please see the history of present illness.     All other systems reviewed and are negative.  EKGs/Labs/Other Studies Reviewed:  The following studies were reviewed today:  Echo 07/20/17:Study Conclusions - Left ventricle: The cavity size was normal. There was severe concentric hypertrophy. Systolic function was moderately to severely reduced. The estimated ejection fraction was in the range of 30% to 35%. Diffuse hypokinesis. Doppler parameters are consistent with abnormal left ventricular relaxation (grade 1 diastolic dysfunction). Doppler parameters are consistent with elevated ventricular end-diastolic filling pressure. - Aortic valve: There was trivial regurgitation. - Aortic root: The aortic root was normal in size. - Mitral valve: There was mild regurgitation directed posteriorly. - Left atrium: The atrium was mildly dilated. - Right ventricle: The cavity size was normal. Wall thickness was normal. Systolic function was normal. - Right atrium: The atrium was normal in size. - Tricuspid valve: There was mild regurgitation. - Pulmonic valve: There was trivial regurgitation. - Pulmonary arteries: Systolic pressure was within the normal range. - Inferior vena cava: The vessel was normal in size. - Pericardium, extracardiac: There was no pericardial effusion.  Impressions: - Severe concentric LVH with diffuse hypokinesis and LVEF 30-35%. A cardiac MRI is recommended to differentiate hypertensive vs infiltrative cardiomyopathy.  RIGHT/LEFT HEART CATH AND CORONARY ANGIOGRAPHY 10/05/17  Conclusion    Multivessel coronary artery disease, with Syntax I score of 29.  Normal left main  Segmental calcified 85% mid LAD stenosis forming a [place] 011 bifurcation lesion with a large first diagonal. LAD wraps around left ventricular apex and supplies collaterals to a small diffusely diseased PDA.  The circumflex is a codominant system with the right coronary and has severe disease in the first (90%) and second (70%) obtuse marginal branches beyond moderate to severe tortuosity. The mid circumflex between the first and second obtuse marginals contains diffuse disease with up to 80% stenosis.  Right coronary is totally occluded proximally with distal continuation supplied by collaterals from the circumflex.  Inferior basal wall severe hypokinesis. Overall normal EF of 55%. Normal LVEDP.  Normal pulmonary artery pressures. Pulmonary capillary wedge pressure 24. LVEDP is normal.  RECOMMENDATIONS:  Relatively young female with significant comorbidity and multivessel coronary disease with intermediate risk for PCI based on Syntax I score.  Further uptitrate statin intensity  Evaluation for multivessel CABG.     EKG:  EKG is not ordered today.   Recent Labs: 07/20/2017: B Natriuretic Peptide 306.8; TSH 0.856 10/08/2017: Magnesium 2.8 12/08/2017: ALT 13; BUN 21; Creatinine, Ser 1.33; Hemoglobin 11.7; Platelets 257; Potassium 4.3; Sodium 142   Recent Lipid Panel    Component Value Date/Time   CHOL 187 07/19/2017 0556   TRIG 210 (H) 07/19/2017 0556   HDL 40 (L) 07/19/2017 0556   CHOLHDL 4.7 07/19/2017 0556   VLDL 42 (H) 07/19/2017 0556   LDLCALC 105 (H) 07/19/2017 0556    Physical Exam:    VS:  BP 130/84   Pulse 63   Ht 5\' 7"  (1.702 m)   Wt 184 lb 1.9 oz (83.5 kg)   LMP 05/19/2013   SpO2 99%   BMI 28.84 kg/m     Wt Readings from Last 3 Encounters:  03/29/18 184 lb 1.9 oz (83.5 kg)  03/10/18 180 lb (81.6 kg)  12/08/17 177 lb (80.3 kg)     Physical Exam   Constitutional: She is oriented to person, place, and time. She appears well-developed and well-nourished. No distress.  Neck: Normal range of motion. Neck supple. No JVD present.  Cardiovascular: Normal rate, regular rhythm, normal heart sounds and intact distal pulses. Exam reveals no gallop and no friction rub.  No murmur  heard. Pulmonary/Chest: Effort normal and breath sounds normal. No respiratory distress. She has no wheezes. She has no rales.  Abdominal: Soft. Bowel sounds are normal.  Musculoskeletal: Normal range of motion. She exhibits no edema or deformity.  Neurological: She is alert and oriented to person, place, and time.  Left sided weakness with numbness and tingling. Slightly slow speech.   Skin: Skin is warm and dry.  Psychiatric: She has a normal mood and affect. Her behavior is normal. Thought content normal.  Vitals reviewed.   ASSESSMENT:    1. Coronary artery disease involving native coronary artery of native heart without angina pectoris   2. Ischemic cardiomyopathy   3. 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 Essential hypertension   5. Hyperlipidemia LDL goal <70   6. Chronic renal insufficiency, stage III (moderate) (HCC)   7. Smoker    PLAN:    In order of problems listed above:  CAD S/P CABG X5 10/07/17: Healed well from surgery, although her skin around the incision still feels tight. She is using Vitamin E oil. Is activity intolerant partly due to left sided weakness from stroke and partly deconditioning as she has not increased her physical activity after surgery. Husband says she sits all day or lays in the bed. We discussed slowly increasing physical activity. Her husband will walk with her for 5 minutes twice a day and then add on a few minutes every week or two until walking 30 minutes per day. We also discussed sit to stand exercises that she can do independently at home  several times per day.   ICM: LVEF 30-35% 07/2017 but F/U TEE  With CABG EF 50-55%. Appears euvolemic. Continue current medications with BB, ARB. Discussed DASH diet   CVA 07/18/17: Left sided weakness, using a cane. Her stroke seems to be contributing more to her activity intolerance than the post bypass surgery.  I think she would benefit from some physical/occupational rehab. She has F/U with neurology next month. On aspirin 325 mg and statin.   Hypertension: BP well controlled.   Hyperlipidemia: On atorvastatin 80 mg daily. LDL was 105 in 06/2017. Will update lipid panel today. We discussed diet changes. Her husband seems motivated and will help her decrease saturated fat intake, use olive oil instead of butter, limit processed foods and fast food. Try to get more fruits and vegetables.   CKD: SCr in the 1.3-1.5 range. Will recheck BMet today.   Tobacco abuse: Using nicotine patches but they fall off. Advised to avoid lotion on skin prior to patch placement. She is down to 1-2 cigarettes per day. Continue working on cessation.   Will close follow up in 3 months to make sure that she is able to make some important life style changes.    Medication Adjustments/Labs and Tests Ordered: Current medicines are reviewed at length with the patient today.  Concerns regarding medicines are outlined above. Labs and tests ordered and medication changes are outlined in the patient instructions below:  Patient Instructions  Medication Instructions: Your physician recommends that you continue on your current medications as directed. Please refer to the Current Medication list given to you today.   Labwork: TODAY: BMET, LIPIDS  Procedures/Testing: None  Follow-Up: Your physician recommends that you schedule a follow-up appointment in: 3-4 months with Dr. Acie Fredrickson   Any Additional Special Instructions Will Be Listed Below (If Applicable).  Begin exercise by walking 5 minutes twice a day  and work  up to 30 minutes per day.  Do sit to stand exercises as we discussed     Sit-to-Stand Exercise The sit-to-stand exercise (also known as the chair stand or chair rise exercise) strengthens your lower body and helps you maintain or improve your mobility and independence. The goal is to do the sit-to-stand exercise without using your hands. This will be easier as you become stronger. You should always talk with your health care provider before starting any exercise program, especially if you have had recent surgery. Do the exercise exactly as told by your health care provider and adjust it as directed. It is normal to feel mild stretching, pulling, tightness, or discomfort as you do this exercise, but you should stop right away if you feel sudden pain or your pain gets worse. Do not begin doing this exercise until told by your health care provider. What the sit-to-stand exercise does The sit-to-stand exercise helps to strengthen the muscles in your thighs and the muscles in the center of your body that give you stability (core muscles). This exercise is especially helpful if:  You have had knee or hip surgery.  You have trouble getting up from a chair, out of a car, or off the toilet.  How to do the sit-to-stand exercise 1. Sit toward the front edge of a sturdy chair without armrests. Your knees should be bent and your feet should be flat on the floor and shoulder-width apart. 2. Place your hands lightly on each side of the seat. Keep your back and neck as straight as possible, with your chest slightly forward. 3. Breathe in slowly. Lean forward and slightly shift your weight to the front of your feet. 4. Breathe out as you slowly stand up. Use your hands as little as possible. 5. Stand and pause for a full breath in and out. 6. Breathe in as you sit down slowly. Tighten your core and abdominal muscles to control your lowering as much as possible. 7. Breathe out slowly. 8. Do this exercise 10-15  times. If needed, do it fewer times until you build up strength. 9. Rest for 1 minute, then do another set of 10-15 repetitions. To change the difficulty of the sit-to-stand exercise  If the exercise is too difficult, use a chair with sturdy armrests, and push off the armrests to help you come to the standing position. You can also use the armrests to help slowly lower yourself back to sitting. As this gets easier, try to use your arms less. You can also place a firm cushion or pillow on the chair to make the surface higher.  If this exercise is too easy, do not use your arms to help raise or lower yourself. You can also wear a weighted vest, use hand weights, increase your repetitions, or try a lower chair. General tips  You may feel tired when starting an exercise routine. This is normal.  You may have muscle soreness that lasts a few days. This is normal. As you get stronger, you may not feel muscle soreness.  Use smooth, steady movements.  Do not  hold your breath during strength exercises. This can cause unsafe changes in your blood pressure.  Breathe in slowly through your nose, and breathe out slowly through your mouth. Summary  Strengthening your lower body is an important step to help you move safely and independently.  The sit-to-stand exercise helps strengthen the muscles in your thighs and core.  You should always talk with your  health care provider before starting any exercise program, especially if you have had recent surgery. This information is not intended to replace advice given to you by your health care provider. Make sure you discuss any questions you have with your health care provider. Document Released: 08/27/2016 Document Revised: 08/27/2016 Document Reviewed: 08/27/2016 Elsevier Interactive Patient Education  2018 [place] DASH stands for "Dietary Approaches to Stop Hypertension." The DASH eating plan is a healthy eating plan that  has been shown to reduce high blood pressure (hypertension). It may also reduce your risk for type 2 diabetes, heart disease, and stroke. The DASH eating plan may also help with weight loss. What are tips for following this plan? General guidelines  Avoid eating more than 2,300 mg (milligrams) of salt (sodium) a day. If you have hypertension, you may need to reduce your sodium intake to 1,500 mg a day.  Limit alcohol intake to no more than 1 drink a day for nonpregnant women and 2 drinks a day for men. One drink equals 12 oz of beer, 5 oz of wine, or 1 oz of hard liquor.  Work with your health care provider to maintain a healthy body weight or to lose weight. Ask what an ideal weight is for you.  Get at least 30 minutes of exercise that causes your heart to beat faster (aerobic exercise) most days of the week. Activities may include walking, swimming, or biking.  Work with your health care provider or diet and nutrition specialist (dietitian) to adjust your eating plan to your individual calorie needs. Reading food labels  Check food labels for the amount of sodium per serving. Choose foods with less than 5 percent of the Daily Value of sodium. Generally, foods with less than 300 mg of sodium per serving fit into this eating plan.  To find whole grains, look for the word "whole" as the first word in the ingredient list. Shopping  Buy products labeled as "low-sodium" or "no salt added."  Buy fresh foods. Avoid canned foods and premade or frozen meals. Cooking  Avoid adding salt when cooking. Use salt-free seasonings or herbs instead of table salt or sea salt. Check with your health care provider or pharmacist before using salt substitutes.  Do not fry foods. Cook foods using healthy methods such as baking, boiling, grilling, and broiling instead.  Cook with heart-healthy oils, such as olive, canola, soybean, or sunflower oil. Meal planning   Eat a balanced diet that includes: ? 5 or  more servings of fruits and vegetables each day. At each meal, try to fill half of your plate with fruits and vegetables. ? Up to 6-8 servings of whole grains each day. ? Less than 6 oz of lean meat, poultry, or fish each day. A 3-oz serving of meat is about the same size as a deck of cards. One egg equals 1 oz. ? 2 servings of low-fat dairy each day. ? A serving of nuts, seeds, or beans 5 times each week. ? Heart-healthy fats. Healthy fats called Omega-3 fatty acids are found in foods such as flaxseeds and coldwater fish, like sardines, salmon, and mackerel.  Limit how much you eat of the following: ? Canned or prepackaged foods. ? Food that is high in trans fat, such as fried foods. ? Food that is high in saturated fat, such as fatty meat. ? Sweets, desserts, sugary drinks, and other foods with added sugar. ? Full-fat dairy products.  Do not salt  foods before eating.  Try to eat at least 2 vegetarian meals each week.  Eat more home-cooked food and less restaurant, buffet, and fast food.  When eating at a restaurant, ask that your food be prepared with less salt or no salt, if possible. What foods are recommended? The items listed may not be a complete list. Talk with your dietitian about what dietary choices are best for you. Grains Whole-grain or whole-wheat bread. Whole-grain or whole-wheat pasta. Brown rice. [place]. Bulgur. Whole-grain and low-sodium cereals. Pita bread. Low-fat, low-sodium crackers. Whole-wheat flour tortillas. Vegetables Fresh or frozen vegetables (raw, steamed, roasted, or grilled). Low-sodium or reduced-sodium tomato and vegetable juice. Low-sodium or reduced-sodium tomato sauce and tomato paste. Low-sodium or reduced-sodium canned vegetables. Fruits All fresh, dried, or frozen fruit. Canned fruit in natural juice (without added sugar). Meat and other protein foods Skinless chicken or [place]. Ground chicken or [place]. Pork with fat trimmed off. Fish  and seafood. Egg whites. Dried beans, peas, or lentils. Unsalted nuts, nut butters, and seeds. Unsalted canned beans. Lean cuts of beef with fat trimmed off. Low-sodium, lean deli meat. Dairy Low-fat (1%) or fat-free (skim) milk. Fat-free, low-fat, or reduced-fat cheeses. Nonfat, low-sodium ricotta or cottage cheese. Low-fat or nonfat yogurt. Low-fat, low-sodium cheese. Fats and oils Soft margarine without trans fats. Vegetable oil. Low-fat, reduced-fat, or light mayonnaise and salad dressings (reduced-sodium). Canola, safflower, olive, soybean, and sunflower oils. Avocado. Seasoning and other foods Herbs. Spices. Seasoning mixes without salt. Unsalted popcorn and pretzels. Fat-free sweets. What foods are not recommended? The items listed may not be a complete list. Talk with your dietitian about what dietary choices are best for you. Grains Baked goods made with fat, such as croissants, muffins, or some breads. Dry pasta or rice meal packs. Vegetables Creamed or fried vegetables. Vegetables in a cheese sauce. Regular canned vegetables (not low-sodium or reduced-sodium). Regular canned tomato sauce and paste (not low-sodium or reduced-sodium). Regular tomato and vegetable juice (not low-sodium or reduced-sodium). Angie Fava. Olives. Fruits Canned fruit in a light or heavy syrup. Fried fruit. Fruit in cream or butter sauce. Meat and other protein foods Fatty cuts of meat. Ribs. Fried meat. Berniece Salines. Sausage. Bologna and other processed lunch meats. Salami. Fatback. Hotdogs. Bratwurst. Salted nuts and seeds. Canned beans with added salt. Canned or smoked fish. Whole eggs or egg yolks. Chicken or [place] with skin. Dairy Whole or 2% milk, cream, and half-and-half. Whole or full-fat cream cheese. Whole-fat or sweetened yogurt. Full-fat cheese. Nondairy creamers. Whipped toppings. Processed cheese and cheese spreads. Fats and oils Butter. Stick margarine. Lard. Shortening. Ghee. Bacon fat. Tropical oils,  such as coconut, palm kernel, or palm oil. Seasoning and other foods Salted popcorn and pretzels. Onion salt, garlic salt, seasoned salt, table salt, and sea salt. Worcestershire sauce. Tartar sauce. Barbecue sauce. Teriyaki sauce. Soy sauce, including reduced-sodium. Steak sauce. Canned and packaged gravies. Fish sauce. Oyster sauce. Cocktail sauce. Horseradish that you find on the shelf. Ketchup. Mustard. Meat flavorings and tenderizers. Bouillon cubes. Hot sauce and Tabasco sauce. Premade or packaged marinades. Premade or packaged taco seasonings. Relishes. Regular salad dressings. Where to find more information:  National Heart, Lung, and [place]: https://wilson-eaton.com/  American Heart Association: www.heart.org Summary  The DASH eating plan is a healthy eating plan that has been shown to reduce high blood pressure (hypertension). It may also reduce your risk for type 2 diabetes, heart disease, and stroke.  With the DASH eating plan, you should limit salt (sodium) intake to 2,300  mg a day. If you have hypertension, you may need to reduce your sodium intake to 1,500 mg a day.  When on the DASH eating plan, aim to eat more fresh fruits and vegetables, whole grains, lean proteins, low-fat dairy, and heart-healthy fats.  Work with your health care provider or diet and nutrition specialist (dietitian) to adjust your eating plan to your individual calorie needs. This information is not intended to replace advice given to you by your health care provider. Make sure you discuss any questions you have with your health care provider. Document Released: 06/25/2011 Document Revised: 06/29/2016 Document Reviewed: 06/29/2016 Elsevier Interactive Patient Education  [employer].    If you need a refill on your cardiac medications before your next appointment, please call your pharmacy.      Signed, Daune Perch, NP  03/29/2018 5:21 PM     [facility]

## 2018-03-30 LAB — BASIC METABOLIC PANEL
BUN / CREAT RATIO: 19 (ref 9–23)
BUN: 24 mg/dL (ref 6–24)
CO2: 20 mmol/L (ref 20–29)
CREATININE: 1.29 mg/dL — AB (ref 0.57–1.00)
Calcium: 9.4 mg/dL (ref 8.7–10.2)
Chloride: 106 mmol/L (ref 96–106)
GFR calc Af Amer: 54 mL/min/{1.73_m2} — ABNORMAL LOW (ref >59)
GFR calc non Af Amer: 47 mL/min/{1.73_m2} — ABNORMAL LOW (ref >59)
GLUCOSE: 93 mg/dL (ref 65–99)
POTASSIUM: 4.5 mmol/L (ref 3.5–5.2)
SODIUM: 142 mmol/L (ref 134–144)

## 2018-03-30 LAB — LIPID PANEL
Chol/HDL Ratio: 2.8 ratio (ref 0.0–4.4)
Cholesterol, Total: 122 mg/dL (ref 100–199)
HDL: 44 mg/dL (ref >39)
LDL Calculated: 53 mg/dL (ref 0–99)
Triglycerides: 125 mg/dL (ref 0–149)
VLDL CHOLESTEROL CAL: 25 mg/dL (ref 5–40)

## 2018-04-05 ENCOUNTER — Other Ambulatory Visit: Payer: Self-pay

## 2018-04-05 MED ORDER — LOSARTAN POTASSIUM 25 MG PO TABS: 25.0000 mg | ORAL_TABLET | Freq: Every day | ORAL | 0 refills | Status: DC

## 2018-04-26 ENCOUNTER — Encounter: Payer: Self-pay | Admitting: Neurology

## 2018-04-26 ENCOUNTER — Ambulatory Visit: Payer: Medicaid Other | Admitting: Neurology

## 2018-04-26 VITALS — BP 96/73 | HR 83 | Ht 67.0 in | Wt 191.0 lb

## 2018-04-26 DIAGNOSIS — I6381 Other cerebral infarction due to occlusion or stenosis of small artery: Secondary | ICD-10-CM | POA: Diagnosis not present

## 2018-04-26 DIAGNOSIS — I639 Cerebral infarction, unspecified: Secondary | ICD-10-CM

## 2018-04-26 NOTE — Patient Instructions (Signed)
I had a long d/w patient about her lacunar stroke,abnormal MRi showing extensive changes of chronic small vessel disease, risk for recurrent stroke/TIAs, personally independently reviewed imaging studies and stroke evaluation results and answered questions.Continue aspirin 325 mg daily  for secondary stroke prevention and maintain strict control of hypertension with blood pressure goal below 130/90, diabetes with hemoglobin A1c goal below 6.5% and lipids with LDL cholesterol goal below 70 mg/dL. I also advised the patient to eat a healthy diet with plenty of whole grains, cereals, fruits and vegetables, exercise regularly and maintain ideal body weight .she was advised to use a cane at all times and we discussed fall risk precautions as well.  I also complemented her on quitting smoking and advised her to continue to do so.  She will return for follow-up in the future in 3 months with my nurse practitioner Janett Billow or call earlier if necessary.     Stroke Prevention Some medical conditions and behaviors are associated with a higher chance of having a stroke. You can help prevent a stroke by making nutrition, lifestyle, and other changes, including managing any medical conditions you may have. What nutrition changes can be made?  Eat healthy foods. You can do this by: ? Choosing foods high in fiber, such as fresh fruits and vegetables and whole grains. ? Eating at least 5 or more servings of fruits and vegetables a day. Try to fill half of your plate at each meal with fruits and vegetables. ? Choosing lean protein foods, such as lean cuts of meat, poultry without skin, fish, tofu, beans, and nuts. ? Eating low-fat dairy products. ? Avoiding foods that are high in salt (sodium). This can help lower blood pressure. ? Avoiding foods that have saturated fat, trans fat, and cholesterol. This can help prevent high cholesterol. ? Avoiding processed and premade foods.  Follow your health care provider's  specific guidelines for losing weight, controlling high blood pressure (hypertension), lowering high cholesterol, and managing diabetes. These may include: ? Reducing your daily calorie intake. ? Limiting your daily sodium intake to 1,500 milligrams (mg). ? Using only healthy fats for cooking, such as olive oil, canola oil, or sunflower oil. ? Counting your daily carbohydrate intake. What lifestyle changes can be made?  Maintain a healthy weight. Talk to your health care provider about your ideal weight.  Get at least 30 minutes of moderate physical activity at least 5 days a week. Moderate activity includes brisk walking, biking, and swimming.  Do not use any products that contain nicotine or tobacco, such as cigarettes and e-cigarettes. If you need help quitting, ask your health care provider. It may also be helpful to avoid exposure to secondhand smoke.  Limit alcohol intake to no more than 1 drink a day for nonpregnant women and 2 drinks a day for men. One drink equals 12 oz of beer, 5 oz of wine, or 1 oz of hard liquor.  Stop any illegal drug use.  Avoid taking birth control pills. Talk to your health care provider about the risks of taking birth control pills if: ? You are over 78 years old. ? You smoke. ? You get migraines. ? You have ever had a blood clot. What other changes can be made?  Manage your cholesterol levels. ? Eating a healthy diet is important for preventing high cholesterol. If cholesterol cannot be managed through diet alone, you may also need to take medicines. ? Take any prescribed medicines to control your cholesterol as told by your  health care provider.  Manage your diabetes. ? Eating a healthy diet and exercising regularly are important parts of managing your blood sugar. If your blood sugar cannot be managed through diet and exercise, you may need to take medicines. ? Take any prescribed medicines to control your diabetes as told by your health care  provider.  Control your hypertension. ? To reduce your risk of stroke, try to keep your blood pressure below 130/80. ? Eating a healthy diet and exercising regularly are an important part of controlling your blood pressure. If your blood pressure cannot be managed through diet and exercise, you may need to take medicines. ? Take any prescribed medicines to control hypertension as told by your health care provider. ? Ask your health care provider if you should monitor your blood pressure at home. ? Have your blood pressure checked every year, even if your blood pressure is normal. Blood pressure increases with age and some medical conditions.  Get evaluated for sleep disorders (sleep apnea). Talk to your health care provider about getting a sleep evaluation if you snore a lot or have excessive sleepiness.  Take over-the-counter and prescription medicines only as told by your health care provider. Aspirin or blood thinners (antiplatelets or anticoagulants) may be recommended to reduce your risk of forming blood clots that can lead to stroke.  Make sure that any other medical conditions you have, such as atrial fibrillation or atherosclerosis, are managed. What are the warning signs of a stroke? The warning signs of a stroke can be easily remembered as BEFAST.  B is for balance. Signs include: ? Dizziness. ? Loss of balance or coordination. ? Sudden trouble walking.  E is for eyes. Signs include: ? A sudden change in vision. ? Trouble seeing.  F is for face. Signs include: ? Sudden weakness or numbness of the face. ? The face or eyelid drooping to one side.  A is for arms. Signs include: ? Sudden weakness or numbness of the arm, usually on one side of the body.  S is for speech. Signs include: ? Trouble speaking (aphasia). ? Trouble understanding.  T is for time. ? These symptoms may represent a serious problem that is an emergency. Do not wait to see if the symptoms will go away.  Get medical help right away. Call your local emergency services (911 in the [place] Do not drive yourself to the hospital.  Other signs of stroke may include: ? A sudden, severe headache with no known cause. ? Nausea or vomiting. ? Seizure.  Where to find more information: For more information, visit:  American Stroke Association: www.strokeassociation.org  National Stroke Association: www.stroke.org  Summary  You can prevent a stroke by eating healthy, exercising, not smoking, limiting alcohol intake, and managing any medical conditions you may have.  Do not use any products that contain nicotine or tobacco, such as cigarettes and e-cigarettes. If you need help quitting, ask your health care provider. It may also be helpful to avoid exposure to secondhand smoke.  Remember BEFAST for warning signs of stroke. Get help right away if you or a loved one has any of these signs. This information is not intended to replace advice given to you by your health care provider. Make sure you discuss any questions you have with your health care provider. Document Released: 08/13/2004 Document Revised: 08/11/2016 Document Reviewed: 08/11/2016 Elsevier Interactive Patient Education  [employer].

## 2018-04-26 NOTE — Progress Notes (Signed)
[facility] [address] [place]. [place] [address] [phone]       OFFICE CONSULT NOTE  Karen. Karen Ochoa Date of Birth:  03/18/1966 Medical Record Number:  [id number]   Referring MD:  Lanae Boast, FNP  Reason for Referral:  Stroke versus Karen  HPI: Karen Ochoa is a pleasant 52 year african american lady who is referred for initial consultation visit today.  History is obtained from the patient as well as review of electronic medical records.  I have personally reviewed imaging films.  She was admitted to [facility] on 07/19/2017 with sudden onset of left-sided weakness for a couple of days prior to presentation.  She was outside the time window for intervention.  She did give history of several mini strokes in the past going back to 2007.  An MRI scan of the brain personally reviewed by me done on 07/18/2017 showed a tiny punctate right posterior paramedian pontine diffusion positive lesion felt to be of acute lacunar infarct.  There were numerous foci of T2/flair hyperintensities present throughout subcortical, periventricular white matter as well as involving the brainstem, corpus callosum bilateral thalami and cerebellar white matter as well.  No enhancing lesions were noted.  A question of demyelinating disease was raised but given patient's prior history and uncontrolled risk factors extensive chronic small vessel disease was felt to be more likely.  MRA of the brain showed no large vessel occlusion but showed severe proximal left P2 stenosis.  Transthoracic echo showed concentric left ventricular hypertrophy with diffuse hypokinesis and diminished left ventricular ejection fraction of 30 to 35%.  Cardiac catheterization on 10/05/2017 showed severe multivessel coronary artery disease for which he underwent five-vessel coronary artery bypass surgery.  She has done well from surgical standpoint and does not have any exertional angina or chest pain.  She still has some  mild residual left leg weakness.  She has to use a cane to walk.  She is careful and she has not had any falls or injuries.  She remains on aspirin which is tolerating well without bruising or bleeding.  She states her blood pressure runs quite good and today in fact it is low at 96/7 54.  She is also tolerating Lipitor well without muscle aches and pains.  She states she had a history of mini strokes in 2007 and review of electronic medical records reveals an MRI at that time which an ultrasound extensive changes of chronic small vessel disease.  She denies history suggestive of multiple sclerosis in the form of episode of vision loss, diplopia, vertigo, excessive fatigue or tiredness or bladder control problems.  She used to smoke 1 pack/day for greater than 10 years but quit after her cardiac surgery.  ROS:   14 system review of systems is positive for weight gain, fatigue, chest pain, spinning sensation, trouble swallowing, blurred vision, shortness of breath, cough, constipation, feeling hot and cold, joint pain, aching muscles, allergies, runny nose, memory loss, confusion, headache, numbness, slurred speech, difficulty swallowing, dizziness, depression, not enough sleep, decreased energy, and all other systems negative PMH:  Past Medical History:  Diagnosis Date  . Asthma 2017  . Fibroids   . GERD (gastroesophageal reflux disease)   . Headache    "weekly" (07/20/2017)  . High cholesterol 07/18/2017  . Hx MRSA infection   . Hypertension   . Insomnia due to medical condition   . Stroke ([place]) 07/18/2017  . TIA (transient ischemic attack)    "I've had several"  Social History:  Social History   Socioeconomic History  . Marital status: Single    Spouse name: Not on file  . Number of children: Not on file  . Years of education: Not on file  . Highest education level: Not on file  Occupational History  . Not on file  Social Needs  . Financial resource strain: Not on file  . Food  insecurity:    Worry: Not on file    Inability: Not on file  . Transportation needs:    Medical: Not on file    Non-medical: Not on file  Tobacco Use  . Smoking status: Former Smoker    Packs/day: 0.50    Years: 10.00    Pack years: 5.00    Types: Cigarettes  . Smokeless tobacco: Never Used  Substance and Sexual Activity  . Alcohol use: No    Alcohol/week: 2.0 standard drinks    Types: 2 Glasses of wine per week    Frequency: Never    Comment: 08/04/2017--states no   . Drug use: No  . Sexual activity: Yes    Birth control/protection: None  Lifestyle  . Physical activity:    Days per week: Not on file    Minutes per session: Not on file  . Stress: Not on file  Relationships  . Social connections:    Talks on phone: Not on file    Gets together: Not on file    Attends religious service: Not on file    Active member of club or organization: Not on file    Attends meetings of clubs or organizations: Not on file    Relationship status: Not on file  . Intimate partner violence:    Fear of current or ex partner: Not on file    Emotionally abused: Not on file    Physically abused: Not on file    Forced sexual activity: Not on file  Other Topics Concern  . Not on file  Social History Narrative  . Not on file    Medications:   Current Outpatient Medications on File Prior to Visit  Medication Sig Dispense Refill  . acetaminophen (TYLENOL) 325 MG tablet Take 2 tablets (650 mg total) by mouth every 6 (six) hours as needed for mild pain. 30 tablet 1  . albuterol (VENTOLIN HFA) 108 (90 Base) MCG/ACT inhaler INHALE 2 PUFFS INTO THE LUNGS EVERY 6 (SIX) HOURS AS NEEDED FOR WHEEZING OR SHORTNESS OF BREATH. 18 g 2  . amLODipine (NORVASC) 10 MG tablet Take 1 tablet (10 mg total) by mouth daily. 30 tablet 6  . aspirin 325 MG tablet Take 1 tablet (325 mg total) by mouth daily. 30 tablet 5  . atorvastatin (LIPITOR) 80 MG tablet TAKE 1 TABLET BY MOUTH EVERY DAY AT 6 PM 90 tablet 1  .  carvedilol (COREG) 25 MG tablet Take 1 tablet (25 mg total) by mouth 2 (two) times daily with a meal. 60 tablet 6  . ibuprofen (ADVIL,MOTRIN) 800 MG tablet Take 1 tablet (800 mg total) by mouth every 8 (eight) hours as needed. 30 tablet 0  . losartan (COZAAR) 25 MG tablet Take 1 tablet (25 mg total) by mouth daily. 30 tablet 0  . traZODone (DESYREL) 50 MG tablet Take 0.5 tablets (25 mg total) by mouth at bedtime as needed for sleep. 30 tablet 2   No current facility-administered medications on file prior to visit.     Allergies:   Allergies  Allergen Reactions  . Penicillins Anaphylaxis  and Other (See Comments)    Has patient had a PCN reaction causing immediate rash, facial/tongue/throat swelling, SOB or lightheadedness with hypotension: Yes Has patient had a PCN reaction causing severe rash involving mucus membranes or skin necrosis: Yes Has patient had a PCN reaction that required hospitalization Yes Has patient had a PCN reaction occurring within the last 10 years: No If all of the above answers are "NO", then may proceed with Cephalosporin use.   . Sulfonamide Derivatives Anaphylaxis  . Clonidine Derivatives Other (See Comments)    Patient passed out shortly after ingesting this at a doctor's office on 05/08/16 (was taken in conjunction with a tablet of Labetalol.)  . Flagyl [Metronidazole] Swelling and Other (See Comments)    Skin blisters  . Labetalol Other (See Comments)    Patient passed out shortly after ingesting this at a doctor's office on 05/08/16 (was taken in conjunction with a tablet of Clonidine.)    Physical Exam General: well developed, well nourished middle-aged African-American lady, seated, in no evident distress Head: head normocephalic and atraumatic.   Neck: supple with no carotid or supraclavicular bruits Cardiovascular: regular rate and rhythm, no murmurs Musculoskeletal: no deformity Skin:  no rash/petichiae large midline chest surgical scar from  CABG Vascular:  Normal pulses all extremities  Neurologic Exam Mental Status: Awake and fully alert. Oriented to place and time. Recent and remote memory intact. Attention span, concentration and fund of knowledge appropriate. Mood and affect appropriate.  Speech is mostly clear with occasional slurred words..  Cranial Nerves: Fundoscopic exam reveals sharp disc margins. Pupils equal, briskly reactive to light. Extraocular movements full without nystagmus. Visual fields full to confrontation. Hearing intact. Facial sensation intact. Face, tongue, palate moves normally and symmetrically.  Motor: Normal bulk and tone. Normal strength in all tested extremity muscles except mild weakness of left grip and intrinsic hand muscles.  Slight weakness of left hip flexors and ankle dorsiflexors.. Sensory.: intact to touch , pinprick , position and vibratory sensation.  Coordination: Rapid alternating movements normal in all extremities. Finger-to-nose and heel-to-shin performed accurately bilaterally. Gait and Station: Arises from chair without difficulty. Stance is normal. Gait demonstrates normal stride length and balance but slight dragging of the left leg.  Unable to heel, toe and tandem walk without difficulty.  Reflexes: 1+ and symmetric. Toes downgoing.   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  Abnormal MRI scan of the brain showing extensive periventricular, subcortical and brainstem white matter changes likely due to chronic small vessel disease rather than demyelinating disease.  Vascular risk factors of smoking hypertension and hyperlipidemia.     PLAN: I had a long d/w patient about her lacunar stroke,abnormal MRi showing extensive changes of chronic small vessel disease, risk for recurrent stroke/TIAs, personally independently reviewed imaging studies  and stroke evaluation results and answered questions.Continue aspirin 325 mg daily  for secondary stroke prevention and maintain strict control of hypertension with blood pressure goal below 130/90, diabetes with hemoglobin A1c goal below 6.5% and lipids with LDL cholesterol goal below 70 mg/dL. I also advised the patient to eat a healthy diet with plenty of whole grains, cereals, fruits and vegetables, exercise regularly and maintain ideal body weight .she was advised to use a cane at all times and we discussed fall risk precautions as well.  I also complemented her on quitting smoking and advised her to continue to  do so.  Greater than 50% time during this 45-minute consultation visit was spent on counseling and coordination of care about her lacunar stroke, abnormal MRI and discussion about stroke risk reduction and answering questions.  She will return for follow-up in the future in 3 months with my nurse practitioner Janett Billow or call earlier if necessary. Antony Contras, MD   04/26/2018 1:31 PM  Note: This document was prepared with digital dictation and possible smart phrase technology. Any transcriptional errors that result from this process are unintentional.

## 2018-05-10 ENCOUNTER — Other Ambulatory Visit: Payer: Self-pay | Admitting: Family Medicine

## 2018-05-10 ENCOUNTER — Telehealth: Payer: Self-pay

## 2018-05-10 NOTE — Telephone Encounter (Signed)
Called and spoke with patient, advised that she has an appointment tomorrow at 2:00pm and will keep appointment. Thanks!

## 2018-05-11 ENCOUNTER — Encounter: Payer: Self-pay | Admitting: Family Medicine

## 2018-05-11 ENCOUNTER — Ambulatory Visit (INDEPENDENT_AMBULATORY_CARE_PROVIDER_SITE_OTHER): Payer: Medicaid Other | Admitting: Family Medicine

## 2018-05-11 VITALS — BP 133/90 | HR 72 | Temp 97.7°F | Resp 16 | Ht 67.0 in | Wt 196.0 lb

## 2018-05-11 DIAGNOSIS — G4700 Insomnia, unspecified: Secondary | ICD-10-CM | POA: Diagnosis not present

## 2018-05-11 DIAGNOSIS — K5900 Constipation, unspecified: Secondary | ICD-10-CM

## 2018-05-11 DIAGNOSIS — J449 Chronic obstructive pulmonary disease, unspecified: Secondary | ICD-10-CM | POA: Diagnosis not present

## 2018-05-11 DIAGNOSIS — I63019 Cerebral infarction due to thrombosis of unspecified vertebral artery: Secondary | ICD-10-CM

## 2018-05-11 MED ORDER — ALBUTEROL SULFATE HFA 108 (90 BASE) MCG/ACT IN AERS: INHALATION_SPRAY | RESPIRATORY_TRACT | 11 refills | Status: DC

## 2018-05-11 MED ORDER — TRAZODONE HCL 50 MG PO TABS
50.0000 mg | ORAL_TABLET | Freq: Every day | ORAL | 1 refills | Status: DC
Start: 2018-05-11 — End: 2018-09-15

## 2018-05-11 MED ORDER — DOCUSATE SODIUM 100 MG PO CAPS: 100.0000 mg | ORAL_CAPSULE | Freq: Every day | ORAL | 2 refills | Status: AC

## 2018-05-11 NOTE — Progress Notes (Signed)
Patient [facility]   Progress Note: General Provider: Lanae Boast, FNP  SUBJECTIVE:   Karen Ochoa is a 52 y.o. female who  has a past medical history of Asthma (2017), Fibroids, GERD (gastroesophageal reflux disease), Headache, High cholesterol (07/18/2017), MRSA infection, Hypertension, Insomnia due to medical condition, Stroke ([place]) (07/18/2017), and TIA (transient ischemic attack).. Patient presents today for Follow-up (TIA/ CVA ) Patient presents for follow-up on chronic conditions.  She continues to have left-sided weakness.  She was seen by [facility] on April 26, 2018.  Patient seen by Dr. Leonie Man.  Patient states that she was told that she could have recurrent strokes due to the extensive changes on the MRI.  She was instructed to continue aspirin 325 mg daily for stroke prevention and to control hypertension and other comorbidities.  She was instructed to eat a healthy diet full of whole-grain cereals fruits and vegetables and to exercise regularly.  She was also advised to use her cane consistently due to fall risks.  She is to follow-up with neurology in 3 months. .Review of Systems  Constitutional: Negative.   HENT: Negative.   Eyes: Negative.   Respiratory: Negative.   Cardiovascular: Negative.   Gastrointestinal: Negative.   Genitourinary: Negative.   Musculoskeletal: Positive for myalgias.  Skin: Negative.   Neurological: Positive for weakness (Left-sided.  Ambulates with cane).  Psychiatric/Behavioral: Negative.      OBJECTIVE: BP (!) 124/97 (BP Location: Right Arm, Patient Position: Sitting, Cuff Size: Normal)   Pulse 72   Temp 97.7 F (36.5 C) (Oral)   Resp 16   Ht 5\' 7"  (1.702 m)   Wt 196 lb (88.9 kg)   LMP 05/19/2013   SpO2 99%   BMI 30.70 kg/m   Physical Exam  Constitutional: She is oriented to person, place, and time. She appears well-developed and well-nourished. No distress.  HENT:    Head: Normocephalic and atraumatic.  Eyes: Pupils are equal, round, and reactive to light. Conjunctivae and EOM are normal.  Neck: Normal range of motion.  Cardiovascular: Normal rate, regular rhythm, normal heart sounds and intact distal pulses.  Pulmonary/Chest: Effort normal and breath sounds normal. No respiratory distress.  Musculoskeletal: She exhibits edema.  Patient with limited range of motion of the left upper and lower extremity due to weakness from stroke.  Neurological: She is alert and oriented to person, place, and time.  Left-sided weakness of upper and lower extremities.  Skin: Skin is warm and dry.  Psychiatric: She has a normal mood and affect. Her behavior is normal. Thought content normal.  Nursing note and vitals reviewed.   ASSESSMENT/PLAN:  1. Chronic obstructive pulmonary disease, unspecified COPD type ([place]) Continue with current medications as previously prescribed. - albuterol (VENTOLIN HFA) 108 (90 Base) MCG/ACT inhaler; INHALE 2 PUFFS INTO THE LUNGS EVERY 6 (SIX) HOURS AS NEEDED FOR WHEEZING OR SHORTNESS OF BREATH.  Dispense: 18 g; Refill: 11  2. 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  Patient is in agreement. - Ambulatory referral to Physical Therapy  3. INSOMNIA, CHRONIC Continue with current medications as previously described.  Discussed sleep hygiene. - traZODone (DESYREL) 50 MG tablet; Take 1 tablet (50 mg total) by mouth at bedtime.  Dispense: 90 tablet; Refill: 1  4. Constipation, unspecified constipation type Increase fluid intake.  Discussed management with exercise and movement.  Also  encouraged a heart healthy diet with fiber and whole grains to help with motility. - docusate sodium (COLACE) 100 MG capsule; Take 1 capsule (100 mg total) by mouth daily.  Dispense: 30 capsule; Refill:  2        The patient was given clear instructions to go to ER or return to medical center if symptoms do not improve, worsen or new problems develop. The patient verbalized understanding and agreed with plan of care.   Ms. Doug Sou. Nathaneil Canary, FNP-BC [facility] [address] [place], [place] [address] [phone]     This note has been created with Dragon speech recognition software and smart phrase technology. Any transcriptional errors are unintentional.

## 2018-05-11 NOTE — Patient Instructions (Signed)

## 2018-05-21 ENCOUNTER — Other Ambulatory Visit: Payer: Self-pay | Admitting: Family Medicine

## 2018-06-10 ENCOUNTER — Other Ambulatory Visit: Payer: Self-pay | Admitting: Family Medicine

## 2018-06-28 ENCOUNTER — Encounter: Payer: Self-pay | Admitting: Cardiovascular Disease

## 2018-06-28 ENCOUNTER — Ambulatory Visit (INDEPENDENT_AMBULATORY_CARE_PROVIDER_SITE_OTHER): Payer: Medicaid Other | Admitting: Cardiovascular Disease

## 2018-06-28 VITALS — BP 128/80 | HR 69 | Ht 67.0 in | Wt 199.0 lb

## 2018-06-28 DIAGNOSIS — I25118 Atherosclerotic heart disease of native coronary artery with other forms of angina pectoris: Secondary | ICD-10-CM

## 2018-06-28 DIAGNOSIS — I10 Essential (primary) hypertension: Secondary | ICD-10-CM

## 2018-06-28 MED ORDER — ASPIRIN EC 81 MG PO TBEC: 81.0000 mg | DELAYED_RELEASE_TABLET | Freq: Every day | ORAL | Status: DC

## 2018-06-28 NOTE — Progress Notes (Signed)
Cardiology Office Note:    Date:  06/28/2018   ID:  Karen Ochoa, DOB 1966-02-07, MRN [id number]  PCP:  Lanae Boast, [place]  Cardiologist:  Mertie Moores, MD  Electrophysiologist:  None   Referring MD: Lanae Boast, FNP   Chief Complaint  Patient presents with  . Coronary Artery Disease     Dec. 10, 2019    Karen Ochoa is a 52 y.o. female with a hx of  CAD and CABG.  I met her in the hospital this past year   She had a CVA on Christmas of 2018.  She had coronary artery bypass grafting in March, 2019. Still has a lot of residual left-sided weakness from her stroke. Living at home.  She denies having any angina pain.  She does have some musculoskeletal soreness when it rains.  Past Medical History:  Diagnosis Date  . Asthma 2017  . Fibroids   . GERD (gastroesophageal reflux disease)   . Headache    "weekly" (07/20/2017)  . High cholesterol 07/18/2017  . Hx MRSA infection   . Hypertension   . Insomnia due to medical condition   . Stroke ([place]) 07/18/2017  . TIA (transient ischemic attack)    "I've had several"    Past Surgical History:  Procedure Laterality Date  . CORONARY ARTERY BYPASS GRAFT N/A 10/07/2017   Procedure: CORONARY ARTERY BYPASS GRAFTING (CABG) ON PUMP TIMES 5 USING LEFT INTERNAL MAMMARY ARTERY AND BILATERAL GREATER SAPHENOUS VEIN VIA ENDOHARDEST.;  Surgeon: Gaye Pollack, MD;  Location: [facility];  Service: Open Heart Surgery;  Laterality: N/A;  . RIGHT/LEFT HEART CATH AND CORONARY ANGIOGRAPHY N/A 10/05/2017   Procedure: RIGHT/LEFT HEART CATH AND CORONARY ANGIOGRAPHY;  Surgeon: Belva Crome, MD;  Location: [facility];  Service: Cardiovascular;  Laterality: N/A;  . TEE WITHOUT CARDIOVERSION N/A 10/07/2017   Procedure: TRANSESOPHAGEAL ECHOCARDIOGRAM (TEE);  Surgeon: Gaye Pollack, MD;  Location: [facility];  Service: Open Heart Surgery;  Laterality: N/A;  . WISDOM TOOTH EXTRACTION      Current Medications: Current Meds  Medication Sig  .  acetaminophen (TYLENOL) 325 MG tablet Take 2 tablets (650 mg total) by mouth every 6 (six) hours as needed for mild pain.  Marland Kitchen albuterol (VENTOLIN HFA) 108 (90 Base) MCG/ACT inhaler INHALE 2 PUFFS INTO THE LUNGS EVERY 6 (SIX) HOURS AS NEEDED FOR WHEEZING OR SHORTNESS OF BREATH.  Marland Kitchen amLODipine (NORVASC) 10 MG tablet Take 1 tablet (10 mg total) by mouth daily.  Marland Kitchen atorvastatin (LIPITOR) 80 MG tablet TAKE 1 TABLET BY MOUTH EVERY DAY AT 6 PM  . carvedilol (COREG) 25 MG tablet Take 1 tablet (25 mg total) by mouth 2 (two) times daily with a meal.  . ibuprofen (ADVIL,MOTRIN) 800 MG tablet TAKE 1 TABLET(800 MG) BY MOUTH EVERY 8 HOURS AS NEEDED  . losartan (COZAAR) 25 MG tablet TAKE 1 TABLET(25 MG) BY MOUTH DAILY  . traZODone (DESYREL) 50 MG tablet Take 1 tablet (50 mg total) by mouth at bedtime.  . [DISCONTINUED] aspirin 325 MG tablet Take 1 tablet (325 mg total) by mouth daily.     Allergies:   Penicillins; Sulfonamide derivatives; Clonidine derivatives; Flagyl [metronidazole]; and Labetalol   Social History   Socioeconomic History  . Marital status: Single    Spouse name: Not on file  . Number of children: Not on file  . Years of education: Not on file  . Highest education level: Not on file  Occupational History  . Not on file  Social Needs  . Financial resource strain: Not on file  . Food insecurity:    Worry: Not on file    Inability: Not on file  . Transportation needs:    Medical: Not on file    Non-medical: Not on file  Tobacco Use  . Smoking status: Former Smoker    Packs/day: 0.50    Years: 10.00    Pack years: 5.00    Types: Cigarettes  . Smokeless tobacco: Never Used  Substance and Sexual Activity  . Alcohol use: No    Alcohol/week: 2.0 standard drinks    Types: 2 Glasses of wine per week    Frequency: Never    Comment: 08/04/2017--states no   . Drug use: No  . Sexual activity: Yes    Birth control/protection: None  Lifestyle  . Physical activity:    Days per week: Not  on file    Minutes per session: Not on file  . Stress: Not on file  Relationships  . Social connections:    Talks on phone: Not on file    Gets together: Not on file    Attends religious service: Not on file    Active member of club or organization: Not on file    Attends meetings of clubs or organizations: Not on file    Relationship status: Not on file  Other Topics Concern  . Not on file  Social History Narrative  . Not on file     Family History: The patient's family history includes Asthma in her brother; Diabetes in her brother and sister; Hypertension in her mother. There is no history of Anesthesia problems.  ROS:   Please see the history of present illness.     All other systems reviewed and are negative.  EKGs/Labs/Other Studies Reviewed:    The following studies were reviewed today:   EKG:    Recent Labs: 07/20/2017: B Natriuretic Peptide 306.8; TSH 0.856 10/08/2017: Magnesium 2.8 12/08/2017: ALT 13; Hemoglobin 11.7; Platelets 257 03/29/2018: BUN 24; Creatinine, Ser 1.29; Potassium 4.5; Sodium 142  Recent Lipid Panel    Component Value Date/Time   CHOL 122 03/29/2018 1559   TRIG 125 03/29/2018 1559   HDL 44 03/29/2018 1559   CHOLHDL 2.8 03/29/2018 1559   CHOLHDL 4.7 07/19/2017 0556   VLDL 42 (H) 07/19/2017 0556   LDLCALC 53 03/29/2018 1559    Physical Exam:    VS:  BP 128/80   Pulse 69   Ht '5\' 7"'  (1.702 m)   Wt 199 lb (90.3 kg)   LMP 05/19/2013   SpO2 98%   BMI 31.17 kg/m     Wt Readings from Last 3 Encounters:  06/28/18 199 lb (90.3 kg)  05/11/18 196 lb (88.9 kg)  04/26/18 191 lb (86.6 kg)     GEN:  Well nourished, well developed in no acute distress HEENT: Normal NECK: No JVD; No carotid bruits LYMPHATICS: No lymphadenopathy CARDIAC: RRR, no murmurs, rubs, gallops RESPIRATORY:  Clear to auscultation without rales, wheezing or rhonchi  ABDOMEN: Soft, non-tender, non-distended MUSCULOSKELETAL:  No edema; No deformity  SKIN: Warm and  dry NEUROLOGIC:  Alert and oriented x 3 PSYCHIATRIC:  Normal affect   ASSESSMENT:    No diagnosis found. PLAN:    In order of problems listed above:  1.  Coronary artery disease: Status post coronary artery bypass grafting.  She seems to be doing well.  She is developed some keloid on the lower aspect of her sternotomy scar.  She seems  to be otherwise healing well. We will decrease her aspirin to 81 mg a day.  2.  History of stroke: She still has significant left sided residual weakness.  She continues to see neuro and rehab.  Medication Adjustments/Labs and Tests Ordered: Current medicines are reviewed at length with the patient today.  Concerns regarding medicines are outlined above.  No orders of the defined types were placed in this encounter.  Meds ordered this encounter  Medications  . aspirin EC 81 MG tablet    Sig: Take 1 tablet (81 mg total) by mouth daily.    Patient Instructions  Medication Instructions:  Your physician has recommended you make the following change in your medication:   DECREASE Aspirin to 81 mg once daily  If you need a refill on your cardiac medications before your next appointment, please call your pharmacy.   Lab work: None Ordered  If you have labs (blood work) drawn today and your tests are completely normal, you will receive your results only by: Marland Kitchen MyChart Message (if you have MyChart) OR . A paper copy in the mail If you have any lab test that is abnormal or we need to change your treatment, we will call you to review the results.  Testing/Procedures: None Ordered   Follow-Up: At [facility], you and your health needs are our priority.  As part of our continuing mission to provide you with exceptional heart care, we have created designated Provider Care Teams.  These Care Teams include your primary Cardiologist (physician) and Advanced Practice Providers (APPs -  Physician Assistants and Nurse Practitioners) who all work together  to provide you with the care you need, when you need it. You will need a follow up appointment in:  6 months.  Please call our office 2 months in advance to schedule this appointment.  You may see Mertie Moores, MD or one of the following Advanced Practice Providers on your designated Care Team: Richardson Dopp, PA-C [place], [place] . Daune Perch, NP       Signed, Mertie Moores, MD  06/28/2018 5:41 PM    [place]

## 2018-06-28 NOTE — Patient Instructions (Signed)
Medication Instructions:  Your physician has recommended you make the following change in your medication:   DECREASE Aspirin to 81 mg once daily  If you need a refill on your cardiac medications before your next appointment, please call your pharmacy.   Lab work: None Ordered  If you have labs (blood work) drawn today and your tests are completely normal, you will receive your results only by: Marland Kitchen MyChart Message (if you have MyChart) OR . A paper copy in the mail If you have any lab test that is abnormal or we need to change your treatment, we will call you to review the results.  Testing/Procedures: None Ordered   Follow-Up: At [facility], you and your health needs are our priority.  As part of our continuing mission to provide you with exceptional heart care, we have created designated Provider Care Teams.  These Care Teams include your primary Cardiologist (physician) and Advanced Practice Providers (APPs -  Physician Assistants and Nurse Practitioners) who all work together to provide you with the care you need, when you need it. You will need a follow up appointment in:  6 months.  Please call our office 2 months in advance to schedule this appointment.  You may see Mertie Moores, MD or one of the following Advanced Practice Providers on your designated Care Team: Richardson Dopp, PA-C [place], [place] . Daune Perch, NP

## 2018-07-04 DIAGNOSIS — Z0289 Encounter for other administrative examinations: Secondary | ICD-10-CM

## 2018-07-05 ENCOUNTER — Other Ambulatory Visit: Payer: Self-pay | Admitting: Physician Assistant

## 2018-07-14 IMAGING — CR DG CHEST 2V
2 series · 2 of 2 positions shown · non-contrast
Comparison: 10/09/2017

CLINICAL DATA: Chest pain and shortness of Breath

EXAM:
CHEST - 2 VIEW

[chest lat]
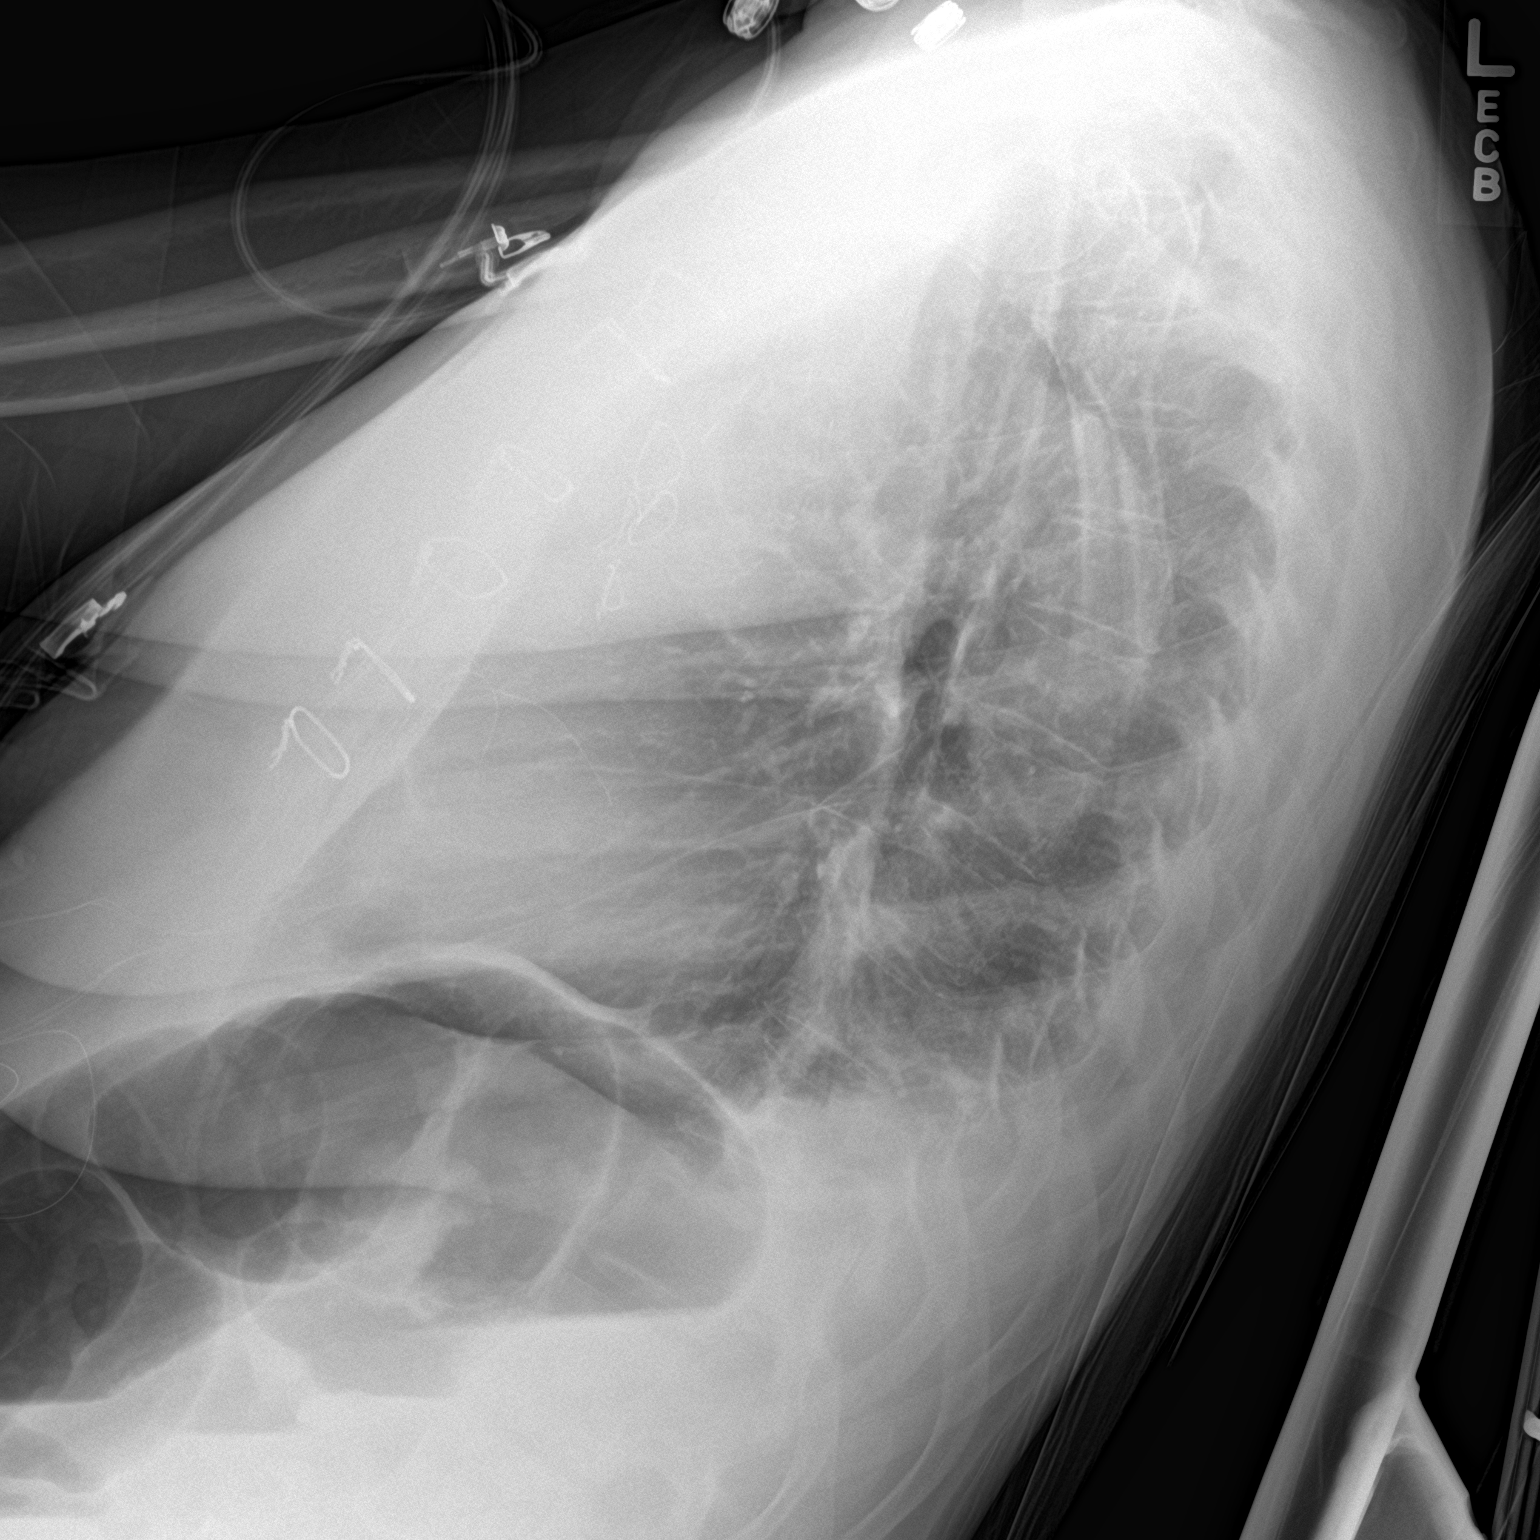

[chest ap]
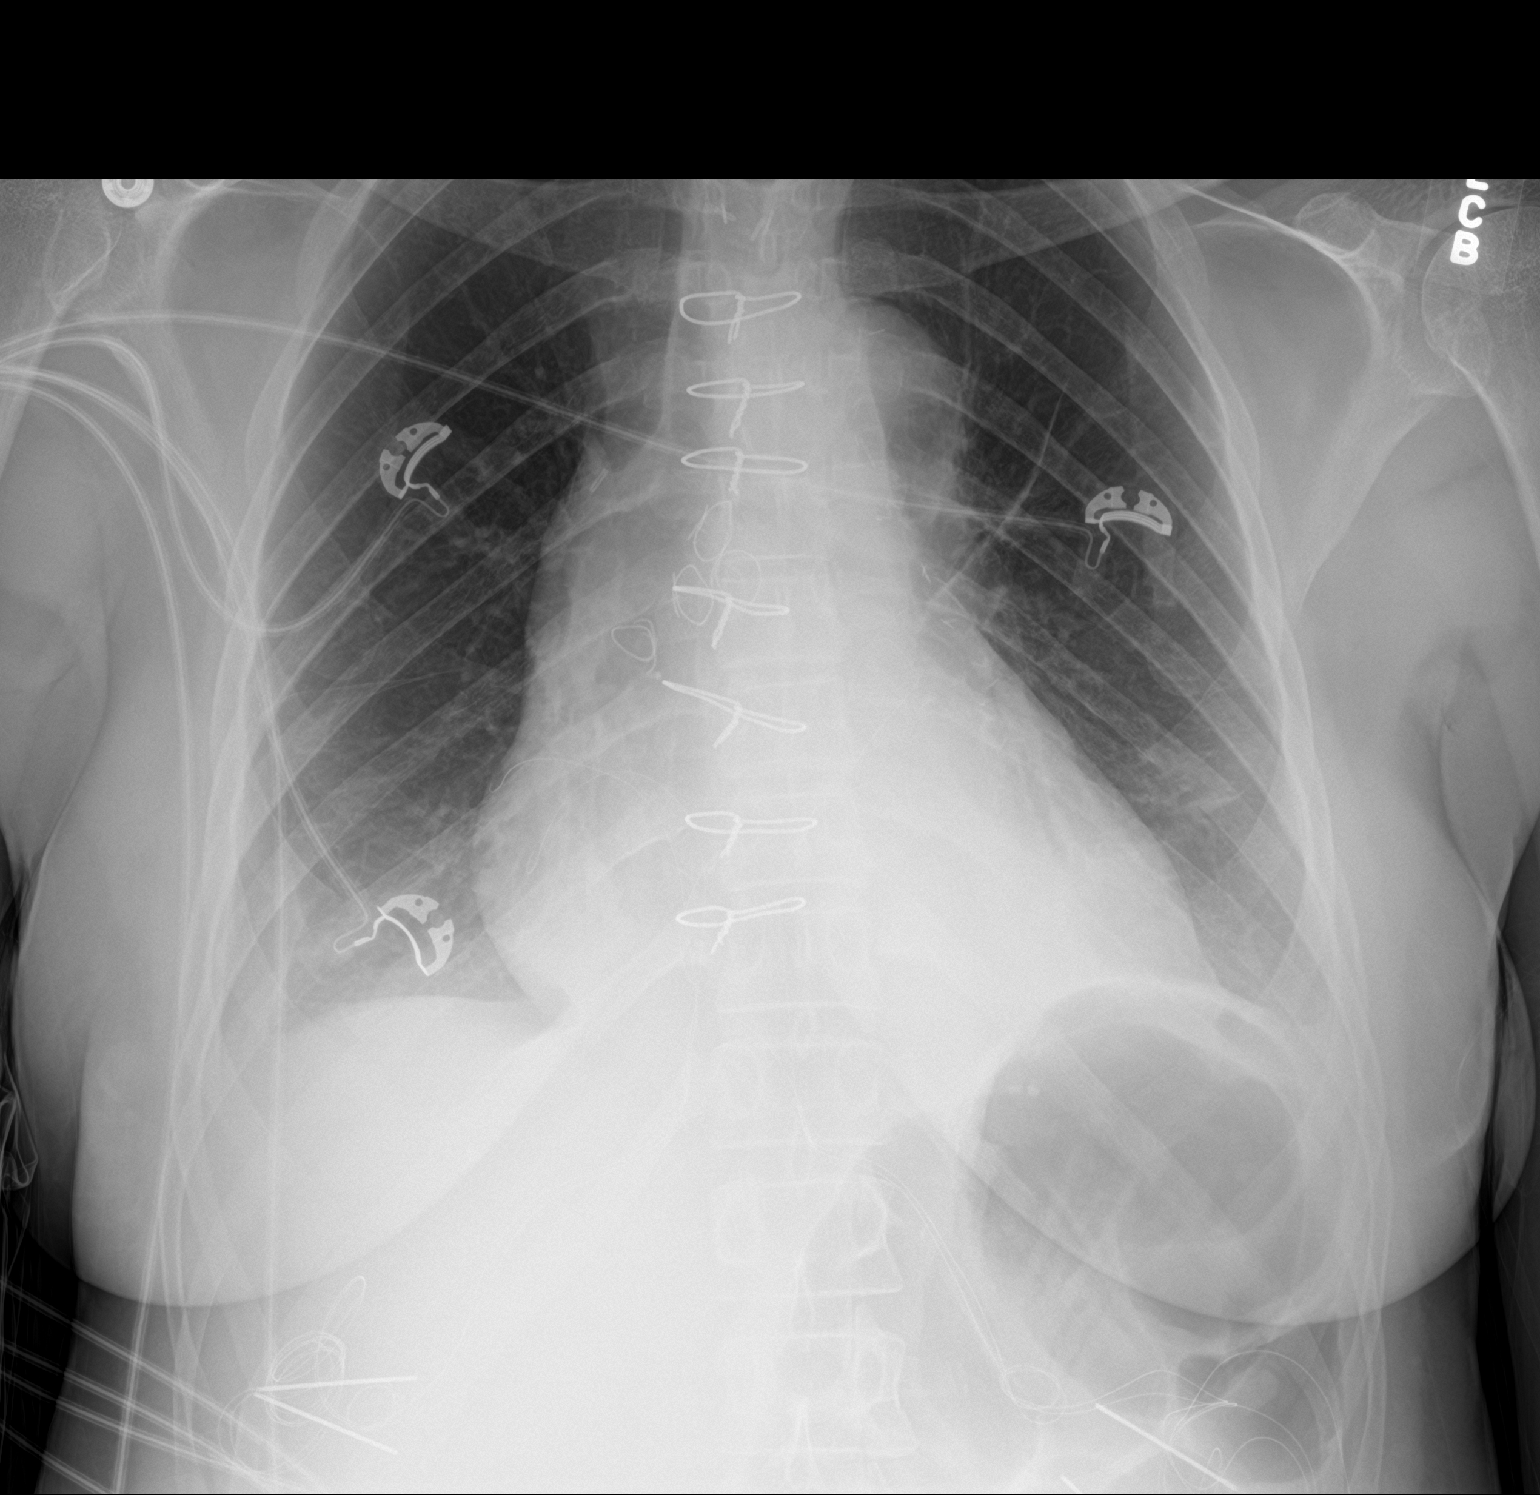

[2 of 2 positions shown; findings below may reference images not displayed]

FINDINGS: Cardiac shadow is mildly enlarged. Postsurgical changes are noted.
The lungs are well aerated bilaterally. Small bilateral pleural
effusions are noted. No significant atelectatic changes are noted.
IMPRESSION: Small effusions bilaterally.

## 2018-07-28 ENCOUNTER — Encounter: Payer: Self-pay | Admitting: Adult Health

## 2018-07-28 ENCOUNTER — Ambulatory Visit: Payer: Medicaid Other | Admitting: Adult Health

## 2018-07-28 VITALS — BP 116/81 | HR 70 | Ht 67.0 in | Wt 201.0 lb

## 2018-07-28 DIAGNOSIS — I10 Essential (primary) hypertension: Secondary | ICD-10-CM | POA: Diagnosis not present

## 2018-07-28 DIAGNOSIS — I69319 Unspecified symptoms and signs involving cognitive functions following cerebral infarction: Secondary | ICD-10-CM | POA: Diagnosis not present

## 2018-07-28 DIAGNOSIS — I6381 Other cerebral infarction due to occlusion or stenosis of small artery: Secondary | ICD-10-CM

## 2018-07-28 DIAGNOSIS — E785 Hyperlipidemia, unspecified: Secondary | ICD-10-CM | POA: Diagnosis not present

## 2018-07-28 NOTE — Progress Notes (Signed)
[facility] [address] [place]. [place] [address] [phone]       OFFICE CONSULT NOTE  [name] Date of Birth:  1966-03-11 Medical Record Number:  [id number]   Referring MD:  Lanae Boast, FNP  Reason for Referral:  Stroke versus MS  Chief Complaint  Patient presents with  . Follow-up    Stroke follow up room 9 pt with husband Karen Ochoa in room pt has cane, stating left side is numb and tingling room 9     HPI:  Initial visit 2019  Dr. Leonie Man: Ms Ochoa is a pleasant 53 year african american lady who is referred for initial consultation visit today.  History is obtained from the patient as well as review of electronic medical records.  I have personally reviewed imaging films.  She was admitted to [facility] on 07/19/2017 with sudden onset of left-sided weakness for a couple of days prior to presentation.  She was outside the time window for intervention.  She did give history of several mini strokes in the past going back to 2007.  An MRI scan of the brain personally reviewed by me done on 07/18/2017 showed a tiny punctate right posterior paramedian pontine diffusion positive lesion felt to be of acute lacunar infarct.  There were numerous foci of T2/flair hyperintensities present throughout subcortical, periventricular white matter as well as involving the brainstem, corpus callosum bilateral thalami and cerebellar white matter as well.  No enhancing lesions were noted.  A question of demyelinating disease was raised but given patient's prior history and uncontrolled risk factors extensive chronic small vessel disease was felt to be more likely.  MRA of the brain showed no large vessel occlusion but showed severe proximal left P2 stenosis.  Transthoracic echo showed concentric left ventricular hypertrophy with diffuse hypokinesis and diminished left ventricular ejection fraction of 30 to 35%.  Cardiac catheterization on 10/05/2017 showed severe  multivessel coronary artery disease for which he underwent five-vessel coronary artery bypass surgery.  She has done well from surgical standpoint and does not have any exertional angina or chest pain.  She still has some mild residual left leg weakness.  She has to use a cane to walk.  She is careful and she has not had any falls or injuries.  She remains on aspirin which is tolerating well without bruising or bleeding.  She states her blood pressure runs quite good and today in fact it is low at 96/7 3.  She is also tolerating Lipitor well without muscle aches and pains.  She states she had a history of mini strokes in 2007 and review of electronic medical records reveals an MRI at that time which an ultrasound extensive changes of chronic small vessel disease.  She denies history suggestive of multiple sclerosis in the form of episode of vision loss, diplopia, vertigo, excessive fatigue or tiredness or bladder control problems.  She used to smoke 1 pack/day for greater than 10 years but quit after her cardiac surgery.  Interval history 07/28/2018: Patient is being seen today for 55-month follow-up visit and is accompanied by her husband.  She does report to have residual left-sided weakness/numbness, balance difficulties and cognitive deficits. Her husband assists with ADL's and IADL's. She continues to do some exercises at home but does endorse increased weakness if stands for too long. She continues to ambulate with cane at all times due to balance difficulties. She denies any recent falls.  She has not returned to working at this  time as she was previously working through [employer].  She is fearful of being unable to type on a computer due to her left hand weakness along with inability to perform job tasks due to her cognitive deficits.  She also states with increased stress, activity or fatigue, her symptoms can worsen.  States she has attempted to apply for disability but has been declined and  currently in review by an attorney.  She continues on aspirin without side effects of bleeding or bruising.  Continues on atorvastatin without side effects myalgias.  Blood pressure today satisfactory at 116/81.  No further concerns at this time.  Denies new or worsening stroke/TIA symptoms.     ROS:   14 system review of systems is positive for runny nose, cough, shortness of breath, chest tightness, chest pain, restless leg, insomnia, snoring, joint pain, aching muscles, walking difficulty, memory loss, dizziness, headache, numbness, speech difficulty, weakness, behavior problem, confusion, depression and nervous/anxious, and all other systems negative   PMH:  Past Medical History:  Diagnosis Date  . Asthma 2017  . Fibroids   . GERD (gastroesophageal reflux disease)   . Headache    "weekly" (07/20/2017)  . High cholesterol 07/18/2017  . Hx MRSA infection   . Hypertension   . Insomnia due to medical condition   . Stroke ([place]) 07/18/2017  . TIA (transient ischemic attack)    "I've had several"    Social History:  Social History   Socioeconomic History  . Marital status: Single    Spouse name: Not on file  . Number of children: Not on file  . Years of education: Not on file  . Highest education level: Not on file  Occupational History  . Not on file  Social Needs  . Financial resource strain: Not on file  . Food insecurity:    Worry: Not on file    Inability: Not on file  . Transportation needs:    Medical: Not on file    Non-medical: Not on file  Tobacco Use  . Smoking status: Former Smoker    Packs/day: 0.50    Years: 10.00    Pack years: 5.00    Types: Cigarettes  . Smokeless tobacco: Never Used  Substance and Sexual Activity  . Alcohol use: No    Alcohol/week: 2.0 standard drinks    Types: 2 Glasses of wine per week    Frequency: Never    Comment: 08/04/2017--states no   . Drug use: No  . Sexual activity: Yes    Birth control/protection: None  Lifestyle    . Physical activity:    Days per week: Not on file    Minutes per session: Not on file  . Stress: Not on file  Relationships  . Social connections:    Talks on phone: Not on file    Gets together: Not on file    Attends religious service: Not on file    Active member of club or organization: Not on file    Attends meetings of clubs or organizations: Not on file    Relationship status: Not on file  . Intimate partner violence:    Fear of current or ex partner: Not on file    Emotionally abused: Not on file    Physically abused: Not on file    Forced sexual activity: Not on file  Other Topics Concern  . Not on file  Social History Narrative  . Not on file    Medications:   Current  Outpatient Medications on File Prior to Visit  Medication Sig Dispense Refill  . acetaminophen (TYLENOL) 325 MG tablet Take 2 tablets (650 mg total) by mouth every 6 (six) hours as needed for mild pain. 30 tablet 1  . albuterol (VENTOLIN HFA) 108 (90 Base) MCG/ACT inhaler INHALE 2 PUFFS INTO THE LUNGS EVERY 6 (SIX) HOURS AS NEEDED FOR WHEEZING OR SHORTNESS OF BREATH. 18 g 11  . amLODipine (NORVASC) 10 MG tablet TAKE 1 TABLET BY MOUTH ONCE DAILY 90 tablet 3  . aspirin EC 81 MG tablet Take 1 tablet (81 mg total) by mouth daily.    Marland Kitchen atorvastatin (LIPITOR) 80 MG tablet TAKE 1 TABLET BY MOUTH EVERY DAY AT 6 PM 90 tablet 1  . carvedilol (COREG) 25 MG tablet Take 1 tablet (25 mg total) by mouth 2 (two) times daily with a meal. 60 tablet 6  . ibuprofen (ADVIL,MOTRIN) 800 MG tablet TAKE 1 TABLET(800 MG) BY MOUTH EVERY 8 HOURS AS NEEDED 30 tablet 0  . losartan (COZAAR) 25 MG tablet TAKE 1 TABLET(25 MG) BY MOUTH DAILY 90 tablet 0  . traZODone (DESYREL) 50 MG tablet Take 1 tablet (50 mg total) by mouth at bedtime. 90 tablet 1   No current facility-administered medications on file prior to visit.     Allergies:   Allergies  Allergen Reactions  . Penicillins Anaphylaxis and Other (See Comments)    Has patient  had a PCN reaction causing immediate rash, facial/tongue/throat swelling, SOB or lightheadedness with hypotension: Yes Has patient had a PCN reaction causing severe rash involving mucus membranes or skin necrosis: Yes Has patient had a PCN reaction that required hospitalization Yes Has patient had a PCN reaction occurring within the last 10 years: No If all of the above answers are "NO", then may proceed with Cephalosporin use.   . Sulfonamide Derivatives Anaphylaxis  . Clonidine Derivatives Other (See Comments)    Patient passed out shortly after ingesting this at a doctor's office on 05/08/16 (was taken in conjunction with a tablet of Labetalol.)  . Flagyl [Metronidazole] Swelling and Other (See Comments)    Skin blisters  . Labetalol Other (See Comments)    Patient passed out shortly after ingesting this at a doctor's office on 05/08/16 (was taken in conjunction with a tablet of Clonidine.)    Physical Exam General: well developed, well nourished middle-aged African-American lady, seated, in no evident distress Head: head normocephalic and atraumatic.   Neck: supple with no carotid or supraclavicular bruits Cardiovascular: regular rate and rhythm, no murmurs Musculoskeletal: no deformity Skin:  no rash/petichiae large midline chest surgical scar from CABG Vascular:  Normal pulses all extremities  Neurologic Exam Mental Status: Awake and fully alert. Oriented to place and time.  Attention span, concentration and fund of knowledge appropriate during assessment. Mood and affect appropriate.  Speech is mostly clear with occasional slurred words. Cranial Nerves: Fundoscopic exam reveals sharp disc margins. Pupils equal, briskly reactive to light. Extraocular movements full without nystagmus. Visual fields full to confrontation. Hearing intact. Facial sensation intact. Face, tongue, palate moves normally and symmetrically.  Motor: Normal bulk and tone. Normal strength in all tested extremity  muscles except mild decreased left hand dexterity and mild grip weakness Sensory.: intact to touch , pinprick , position and vibratory sensation.  Coordination: Rapid alternating movements normal on right side. Finger-to-nose and heel-to-shin performed accurately on right side.  Orbits right arm over left arm.  Decreased left hand finger dexterity. Gait and Station: Arises from chair  without difficulty. Stance is normal. Gait demonstrates normal stride length and balance with slight favoring of left leg and use of cane.  Unable to heel, toe and tandem walk without difficulty.  Reflexes: 1+ and symmetric. Toes downgoing.   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  Abnormal MRI scan of the brain showing extensive periventricular, subcortical and brainstem white matter changes likely due to chronic small vessel disease rather than demyelinating disease.  Vascular risk factors of smoking hypertension and hyperlipidemia.  Patient is being seen today for follow-up visit with continued left upper extremity weakness, gait imbalance and cognitive deficits.     PLAN: -Continue aspirin 81 mg and atorvastatin for secondary stroke prevention -f/u with PCP for HLD and HTN management -Referral placed for neurocognitive evaluation due to continued cognitive deficits and memory loss -Recommended initiating PT/OT for continued deficits along with unclear need for continued ADL assistance by husband.  She states she is unable to participate at this time due to lack of transportation.  SCAT assistance information provided to patient in order to obtain transportation.  Did advise patient that once transportation is obtained, notify office orders can be placed for PT/OT -Continue to monitor blood pressure at home -Recommended increasing activity level as tolerated along with  continued healthy diet -Maintain strict control of hypertension with blood pressure goal below 130/90, diabetes with hemoglobin A1c goal below 6.5% and cholesterol with LDL cholesterol (bad cholesterol) goal below 70 mg/dL. I also advised the patient to eat a healthy diet with plenty of whole grains, cereals, fruits and vegetables, exercise regularly and maintain ideal body weight.  Follow-up in 6 months or call earlier if needed with questions, concerns or need of sooner follow-up appointment  Greater than 50% time during this 45-minute consultation visit was spent on counseling and coordination of care about her lacunar stroke, abnormal MRI and discussion about stroke risk reduction and answering questions.    Venancio Poisson, AGNP-BC  [facility] [address] [place], [place] [address]  Phone [phone] Fax [phone] Note: This document was prepared with digital dictation and possible smart phrase technology. Any transcriptional errors that result from this process are unintentional.

## 2018-07-28 NOTE — Patient Instructions (Signed)
Continue aspirin 81 mg daily  and lipitor  for secondary stroke prevention  Continue to follow up with PCP regarding cholesterol and blood pressure management   Referral placed for neurocognitive evaluation for continued memory loss  Call [phone] for SCAT assistance   Continue to monitor blood pressure at home  Maintain strict control of hypertension with blood pressure goal below 130/90, diabetes with hemoglobin A1c goal below 6.5% and cholesterol with LDL cholesterol (bad cholesterol) goal below 70 mg/dL. I also advised the patient to eat a healthy diet with plenty of whole grains, cereals, fruits and vegetables, exercise regularly and maintain ideal body weight.  Followup in the future with me in 6 months or call earlier if needed       Thank you for coming to see [place] at [facility]. I hope we have been able to provide you high quality care today.  You may receive a patient satisfaction survey over the next few weeks. We would appreciate your feedback and comments so that we may continue to improve ourselves and the health of our patients.

## 2018-08-05 NOTE — Progress Notes (Signed)
I agree with the above plan 

## 2018-08-11 ENCOUNTER — Encounter: Payer: Self-pay | Admitting: Family Medicine

## 2018-08-11 ENCOUNTER — Ambulatory Visit (INDEPENDENT_AMBULATORY_CARE_PROVIDER_SITE_OTHER): Payer: Medicaid Other | Admitting: Family Medicine

## 2018-08-11 VITALS — BP 122/92 | HR 72 | Temp 97.7°F | Resp 16 | Ht 67.0 in | Wt 210.0 lb

## 2018-08-11 DIAGNOSIS — J40 Bronchitis, not specified as acute or chronic: Secondary | ICD-10-CM

## 2018-08-11 DIAGNOSIS — Z1211 Encounter for screening for malignant neoplasm of colon: Secondary | ICD-10-CM

## 2018-08-11 DIAGNOSIS — I10 Essential (primary) hypertension: Secondary | ICD-10-CM | POA: Diagnosis not present

## 2018-08-11 DIAGNOSIS — Z1231 Encounter for screening mammogram for malignant neoplasm of breast: Secondary | ICD-10-CM | POA: Diagnosis not present

## 2018-08-11 LAB — POCT URINALYSIS DIPSTICK
Bilirubin, UA: NEGATIVE
Blood, UA: NEGATIVE
Glucose, UA: NEGATIVE
Ketones, UA: NEGATIVE
Leukocytes, UA: NEGATIVE
Nitrite, UA: NEGATIVE
Protein, UA: NEGATIVE
Spec Grav, UA: 1.02 (ref 1.010–1.025)
Urobilinogen, UA: 1 E.U./dL
pH, UA: 5.5 (ref 5.0–8.0)

## 2018-08-11 MED ORDER — BENZONATATE 100 MG PO CAPS: 100.0000 mg | ORAL_CAPSULE | Freq: Two times a day (BID) | ORAL | 0 refills | Status: DC | PRN

## 2018-08-11 MED ORDER — DOXYCYCLINE HYCLATE 100 MG PO TABS: 100.0000 mg | ORAL_TABLET | Freq: Two times a day (BID) | ORAL | 0 refills | Status: DC

## 2018-08-11 MED ORDER — METHYLPREDNISOLONE 4 MG PO TBPK: ORAL_TABLET | ORAL | 0 refills | Status: DC

## 2018-08-11 MED ORDER — AZELASTINE HCL 0.1 % NA SOLN: 2.0000 | Freq: Two times a day (BID) | NASAL | 5 refills | Status: DC

## 2018-08-11 NOTE — Progress Notes (Signed)
[facility]   Progress Note: General Provider: Lanae Boast, FNP  SUBJECTIVE:   Karen Ochoa is a 53 y.o. female who  has a past medical history of Asthma (2017), Fibroids, GERD (gastroesophageal reflux disease), Headache, High cholesterol (07/18/2017), MRSA infection, Hypertension, Insomnia due to medical condition, Stroke ([place]) (07/18/2017), and TIA (transient ischemic attack).. Patient presents today for Hypertension and Cough (x 2 weeks )   Patient seen by neurology on July 28, 2018.  The plan is to continue 81 mg of aspirin and atorvastatin for secondary stroke prevention.  Patient is also recommended to start physical therapy or occupational therapy for continued deficits.  She is also advised to increase activity level as tolerated.  Her blood pressure goal is to be below 130/90 with an A1c at or below 6.5 and cholesterol LDL below 70. Patient referred to neurocognitive evaluation.  Patient has an attorney and applied for disability.  Patient presents today for follow-up on hypertension.  She states that she is doing well on all current medications. Patient states that she has had a nonproductive cough for the past 2 weeks.  Has taken corciden HBP with mild relief.  Patient smoking 3 cigs per day.  Review of Systems  Constitutional: Negative.   HENT: Negative.   Eyes: Negative.   Respiratory: Positive for cough and sputum production. Negative for hemoptysis.   Cardiovascular: Negative.   Gastrointestinal: Negative.   Genitourinary: Negative.   Musculoskeletal: Negative.   Skin: Negative.   Neurological: Positive for weakness.  Psychiatric/Behavioral: Negative.      OBJECTIVE: BP (!) 122/92 (BP Location: Right Arm, Patient Position: Sitting, Cuff Size: Large)   Pulse 72   Temp 97.7 F (36.5 C) (Oral)   Resp 16   Ht 5\' 7"  (1.702 m)   Wt 210 lb (95.3 kg)   LMP 05/19/2013   SpO2 100%   BMI 32.89 kg/m   Wt Readings  from Last 3 Encounters:  08/11/18 210 lb (95.3 kg)  07/28/18 201 lb (91.2 kg)  06/28/18 199 lb (90.3 kg)     Physical Exam Vitals signs and nursing note reviewed.  Constitutional:      General: She is not in acute distress.    Appearance: She is well-developed.  HENT:     Head: Normocephalic and atraumatic.  Eyes:     Conjunctiva/sclera: Conjunctivae normal.     Pupils: Pupils are equal, round, and reactive to light.  Neck:     Musculoskeletal: Normal range of motion.  Cardiovascular:     Rate and Rhythm: Normal rate and regular rhythm.     Heart sounds: Normal heart sounds.  Pulmonary:     Effort: Pulmonary effort is normal. No respiratory distress.     Breath sounds: Wheezing (Throughout all lung fields on expiration and inhalation) present.  Abdominal:     General: Bowel sounds are normal. There is no distension.     Palpations: Abdomen is soft.  Musculoskeletal: Normal range of motion.  Skin:    General: Skin is warm and dry.  Neurological:     Mental Status: She is alert and oriented to person, place, and time.  Psychiatric:        Behavior: Behavior normal.        Thought Content: Thought content normal.     ASSESSMENT/PLAN: 1. Essential hypertension No medication changes present time.  We will continue to monitor. - Urinalysis Dipstick  2. Screen for colon cancer Order placed for  Cologuard - Cologuard  3. Breast cancer screening by mammogram Order placed.  Patient advised to call breast center to schedule an appointment - MM Digital Screening; Future  4. Bronchitis Possible etiology of allergic rhinitis in combination with weather changes and compromised immune system due to recent surgery and change in health condition. - azelastine (ASTELIN) 0.1 % nasal spray; Place 2 sprays into both nostrils 2 (two) times daily. Use in each nostril as directed  Dispense: 30 mL; Refill: 5 - doxycycline (VIBRA-TABS) 100 MG tablet; Take 1 tablet (100 mg total) by mouth 2  (two) times daily.  Dispense: 20 tablet; Refill: 0 - benzonatate (TESSALON) 100 MG capsule; Take 1 capsule (100 mg total) by mouth 2 (two) times daily as needed for cough.  Dispense: 20 capsule; Refill: 0 - methylPREDNISolone (MEDROL DOSEPAK) 4 MG TBPK tablet; Take as directed on pack  Dispense: 21 tablet; Refill: 0   Return in about 3 months (around 11/10/2018) for HTN 1 week for flu shot nurse visit.    The patient was given clear instructions to go to ER or return to medical center if symptoms do not improve, worsen or new problems develop. The patient verbalized understanding and agreed with plan of care.   Ms. Doug Sou. Nathaneil Canary, FNP-BC Patient [facility] [address] [place], [place] [address] [phone]

## 2018-08-15 ENCOUNTER — Telehealth: Payer: Self-pay | Admitting: Psychology

## 2018-08-24 ENCOUNTER — Ambulatory Visit: Payer: Medicaid Other

## 2018-08-26 ENCOUNTER — Telehealth: Payer: Self-pay

## 2018-08-26 ENCOUNTER — Other Ambulatory Visit: Payer: Self-pay | Admitting: Physician Assistant

## 2018-08-26 NOTE — Telephone Encounter (Signed)
-----   Message from Lanae Boast, [place] sent at 08/26/2018  3:42 PM EST ----- Regarding: Cologard Please let patient know that her cologard results were negative.  She will need another screening in 3-5 years.  Thanks, Venora Maples

## 2018-08-26 NOTE — Telephone Encounter (Signed)
Called, no answer. Left a message that colon cancer screening was negative and that she will need another screening in 3 to 5 years. Asked if any questions to call back to our office. Thanks!

## 2018-09-14 ENCOUNTER — Telehealth: Payer: Self-pay

## 2018-09-14 NOTE — Telephone Encounter (Signed)
I called Lashonda at [place] ext [phone]. She works for [employer], counselling for disability. I stated the form wants Janett Billow NP to ask questions about the cane pt is using. Caryl Ada stated the pt file a claim last year. In June pt was evaluated by the [facility] claims medical doctor  and she was independent with no cane assistance. Caryl Ada stated our note from 04/2018 states pt use cane at all times. They wanted to know if pt needs the cane at all times or what was the change.  Caryl Ada stated to answer which questions are appropriate. I stated Janett Billow NP will look at form and complete it.Caryl Ada verbalized understanding.

## 2018-09-14 NOTE — Telephone Encounter (Signed)
Fax on cane use questions sent to [employer]ashonda at [phone]. Fax twice and confirmed.

## 2018-09-15 ENCOUNTER — Other Ambulatory Visit: Payer: Self-pay | Admitting: Family Medicine

## 2018-09-15 ENCOUNTER — Telehealth: Payer: Self-pay

## 2018-09-15 DIAGNOSIS — G4700 Insomnia, unspecified: Secondary | ICD-10-CM

## 2018-09-15 NOTE — Telephone Encounter (Signed)
Patient states that her Trazadone should have been changed to 2 tablets at bedtime. The instructions in the computer are 1 at bedtime, she received a refill on this today and can not get it due to the instructions. Please advise. Thanks!

## 2018-09-16 NOTE — Telephone Encounter (Signed)
Patient was taking 25mg  because she was taking 1/2 of the 50mg . I increased her dose to 50mg . So she should be taking 1 pill every night. I gave her a 90 day supply.

## 2018-09-16 NOTE — Telephone Encounter (Signed)
Called patient to advised that we had increased the dose. However patient states she was originally taking 50mg  not 25mg . Please advise.

## 2018-09-17 ENCOUNTER — Other Ambulatory Visit: Payer: Self-pay | Admitting: Family Medicine

## 2018-09-19 ENCOUNTER — Other Ambulatory Visit: Payer: Self-pay | Admitting: Family Medicine

## 2018-09-19 DIAGNOSIS — G4700 Insomnia, unspecified: Secondary | ICD-10-CM

## 2018-09-19 MED ORDER — TRAZODONE HCL 100 MG PO TABS: 100.0000 mg | ORAL_TABLET | Freq: Every day | ORAL | 1 refills | Status: DC

## 2018-09-19 NOTE — Progress Notes (Signed)
I increased the medication. Trazodone 100mg  1 tab by mouth at bed time for insomnia.

## 2018-10-31 ENCOUNTER — Ambulatory Visit: Payer: Medicaid Other | Admitting: Psychology

## 2018-11-10 ENCOUNTER — Ambulatory Visit (INDEPENDENT_AMBULATORY_CARE_PROVIDER_SITE_OTHER): Payer: Medicaid Other | Admitting: Family Medicine

## 2018-11-10 ENCOUNTER — Other Ambulatory Visit: Payer: Self-pay

## 2018-11-10 DIAGNOSIS — N183 Chronic kidney disease, stage 3 unspecified: Secondary | ICD-10-CM

## 2018-11-10 DIAGNOSIS — R609 Edema, unspecified: Secondary | ICD-10-CM | POA: Diagnosis not present

## 2018-11-10 DIAGNOSIS — I63019 Cerebral infarction due to thrombosis of unspecified vertebral artery: Secondary | ICD-10-CM

## 2018-11-10 DIAGNOSIS — I25118 Atherosclerotic heart disease of native coronary artery with other forms of angina pectoris: Secondary | ICD-10-CM

## 2018-11-10 MED ORDER — FUROSEMIDE 20 MG PO TABS: 20.0000 mg | ORAL_TABLET | Freq: Every day | ORAL | 3 refills | Status: DC

## 2018-11-10 NOTE — Progress Notes (Signed)
  Patient [facility]  Virtual Visit via Telephone Note  I connected with Tory Mckissack Ochoa on 11/10/18 at  1:40 PM EDT by telephone and verified that I am speaking with the correct person using two identifiers.   I discussed the limitations, risks, security and privacy concerns of performing an evaluation and management service by telephone and the availability of in person appointments. I also discussed with the patient that there may be a patient responsible charge related to this service. The patient expressed understanding and agreed to proceed.   History of Present Illness: Karen Ochoa  has a past medical history of Asthma (2017), Fibroids, GERD (gastroesophageal reflux disease), Headache, High cholesterol (07/18/2017), MRSA infection, Hypertension, Insomnia due to medical condition, Stroke ([place]) (07/18/2017), and TIA (transient ischemic attack).   Patient states that she has mild swelling of the lower right leg. She denies having an increase in sodium intake. She states that the swelling resolves with elevation of the lower extremity. She denies erythema, warmth or pain to the legs. She reports compliance with medications.  Observations/Objective: Patient with regular voice tone, rate and rhythm. Speaking calmly and is in no apparent distress.    Assessment and Plan: 1. 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 Coronary artery disease of native artery of native heart with stable angina pectoris ([place])  3. Chronic renal insufficiency, stage III (moderate) (H[facility]) - Comprehensive metabolic panel; Future  4. Edema, unspecified type - furosemide (LASIX) 20 MG tablet; Take 1 tablet (20 mg total) by mouth daily.  Dispense: 30 tablet; Refill: 3   Starting patient on diuretic today. She is to come in this week for lab work.   Follow Up Instructions:  We discussed hand washing,  using hand sanitizer when soap and water are not available, only going out when absolutely necessary, and social distancing. Explained to patient that she is immunocompromised and will need to take precautions during this time.   I discussed the assessment and treatment plan with the patient. The patient was provided an opportunity to ask questions and all were answered. The patient agreed with the plan and demonstrated an understanding of the instructions.   The patient was advised to call back or seek an in-person evaluation if the symptoms worsen or if the condition fails to improve as anticipated.  I provided 10 minutes of non-face-to-face time during this encounter.  Ms. Andr L. Nathaneil Canary, FNP-BC Patient [facility] [address] [place], [place] [address] [phone]

## 2018-11-24 ENCOUNTER — Other Ambulatory Visit: Payer: Self-pay | Admitting: Family Medicine

## 2018-11-24 ENCOUNTER — Telehealth: Payer: Self-pay

## 2018-11-24 ENCOUNTER — Other Ambulatory Visit: Payer: Medicaid Other

## 2018-11-24 ENCOUNTER — Other Ambulatory Visit: Payer: Self-pay

## 2018-11-24 DIAGNOSIS — N183 Chronic kidney disease, stage 3 unspecified: Secondary | ICD-10-CM

## 2018-11-24 DIAGNOSIS — K219 Gastro-esophageal reflux disease without esophagitis: Secondary | ICD-10-CM

## 2018-11-24 MED ORDER — FAMOTIDINE 20 MG PO TABS: 20.0000 mg | ORAL_TABLET | Freq: Two times a day (BID) | ORAL | 0 refills | Status: DC

## 2018-11-24 NOTE — Telephone Encounter (Signed)
Called, no answer. Left a message that medication was sent into pharmacy. Thanks!

## 2018-11-24 NOTE — Telephone Encounter (Signed)
Patient states that her acid reflux has been worse since she had heart surgery and would like to have something sent into patient. Patient normally see Venora Maples. Patient is aware that you are out of office today.

## 2018-11-25 LAB — COMPREHENSIVE METABOLIC PANEL
ALT: 14 IU/L (ref 0–32)
AST: 20 IU/L (ref 0–40)
Albumin/Globulin Ratio: 1.9 (ref 1.2–2.2)
Albumin: 4.3 g/dL (ref 3.8–4.9)
Alkaline Phosphatase: 164 IU/L — ABNORMAL HIGH (ref 39–117)
BUN/Creatinine Ratio: 11 (ref 9–23)
BUN: 14 mg/dL (ref 6–24)
Bilirubin Total: 0.4 mg/dL (ref 0.0–1.2)
CO2: 25 mmol/L (ref 20–29)
Calcium: 9.5 mg/dL (ref 8.7–10.2)
Chloride: 106 mmol/L (ref 96–106)
Creatinine, Ser: 1.27 mg/dL — ABNORMAL HIGH (ref 0.57–1.00)
GFR calc Af Amer: 55 mL/min/{1.73_m2} — ABNORMAL LOW (ref >59)
GFR calc non Af Amer: 48 mL/min/{1.73_m2} — ABNORMAL LOW (ref >59)
Globulin, Total: 2.3 g/dL (ref 1.5–4.5)
Glucose: 109 mg/dL — ABNORMAL HIGH (ref 65–99)
Potassium: 3.7 mmol/L (ref 3.5–5.2)
Sodium: 143 mmol/L (ref 134–144)
Total Protein: 6.6 g/dL (ref 6.0–8.5)

## 2018-11-30 ENCOUNTER — Telehealth: Payer: Self-pay

## 2018-11-30 NOTE — Telephone Encounter (Signed)
Called and advised that all labs are stable without significant clinical change. Advised to continue all medications as changed.

## 2018-11-30 NOTE — Telephone Encounter (Signed)
-----   Message from Lanae Boast, [place] sent at 11/30/2018  9:07 AM EDT ----- The  labs are stable without significant clinical change.  All other results are normal or within acceptable limits. No Medication changes

## 2018-12-07 DIAGNOSIS — Z0271 Encounter for disability determination: Secondary | ICD-10-CM

## 2018-12-09 ENCOUNTER — Encounter: Payer: Self-pay | Admitting: Family Medicine

## 2018-12-16 ENCOUNTER — Other Ambulatory Visit: Payer: Self-pay | Admitting: Family Medicine

## 2019-01-17 ENCOUNTER — Telehealth: Payer: Self-pay

## 2019-01-17 DIAGNOSIS — Z8673 Personal history of transient ischemic attack (TIA), and cerebral infarction without residual deficits: Secondary | ICD-10-CM

## 2019-01-17 DIAGNOSIS — Z7409 Other reduced mobility: Secondary | ICD-10-CM

## 2019-01-17 NOTE — Telephone Encounter (Signed)
Patient is asking for a rx for a walker. She states her pain management appointment has been pushed back until august due to Covid and she is having so much pain and needs a walker to help. Please advise. She is scheduled for 02/09/2019 with you. Thanks !

## 2019-01-18 MED ORDER — ROLLATOR ULTRA-LIGHT MISC: 0 refills | Status: DC

## 2019-01-18 NOTE — Telephone Encounter (Signed)
Called and left message that rx for walker has been faxed to [facility]. Thanks!

## 2019-01-24 ENCOUNTER — Telehealth: Payer: Self-pay

## 2019-01-24 ENCOUNTER — Other Ambulatory Visit: Payer: Self-pay

## 2019-01-24 DIAGNOSIS — Z8673 Personal history of transient ischemic attack (TIA), and cerebral infarction without residual deficits: Secondary | ICD-10-CM

## 2019-01-24 DIAGNOSIS — Z7409 Other reduced mobility: Secondary | ICD-10-CM

## 2019-01-24 NOTE — Telephone Encounter (Signed)
Called, no answer no voicemail.

## 2019-01-25 ENCOUNTER — Telehealth: Payer: Self-pay

## 2019-01-25 NOTE — Telephone Encounter (Signed)
Called patient. She wanted to let us know that [facility] is going to deliver her walker on Friday. Thanks!

## 2019-01-25 NOTE — Telephone Encounter (Signed)
ERROR

## 2019-01-26 ENCOUNTER — Ambulatory Visit: Payer: Medicaid Other | Admitting: Adult Health

## 2019-01-28 ENCOUNTER — Other Ambulatory Visit: Payer: Self-pay | Admitting: Physician Assistant

## 2019-02-03 ENCOUNTER — Telehealth: Payer: Self-pay

## 2019-02-03 NOTE — Telephone Encounter (Signed)
YOUR CARDIOLOGY TEAM HAS ARRANGED FOR AN E-VISIT FOR YOUR APPOINTMENT - PLEASE REVIEW IMPORTANT INFORMATION BELOW SEVERAL DAYS PRIOR TO YOUR APPOINTMENT  Due to the recent COVID-19 pandemic, we are transitioning in-person office visits to tele-medicine visits in an effort to decrease unnecessary exposure to our patients, their families, and staff. These visits are billed to your insurance just like a normal visit is. We also encourage you to sign up for MyChart if you have not already done so. You will need a smartphone if possible. For patients that do not have this, we can still complete the visit using a regular telephone but do prefer a smartphone to enable video when possible. You may have a family member that lives with you that can help. If possible, we also ask that you have a blood pressure cuff and scale at home to measure your blood pressure, heart rate and weight prior to your scheduled appointment. Patients with clinical needs that need an in-person evaluation and testing will still be able to come to the office if absolutely necessary. If you have any questions, feel free to call our office.     YOUR PROVIDER WILL BE USING THE FOLLOWING PLATFORM TO COMPLETE YOUR VISIT: Doximity  . IF USING MYCHART - How to Download the MyChart App to Your SmartPhone   - If Apple, go to App Store and type in MyChart in the search bar and download the app. If Android, ask patient to go to Google Play Store and type in MyChart in the search bar and download the app. The app is free but as with any other app downloads, your phone may require you to verify saved payment information or Apple/Android password.  - You will need to then log into the app with your MyChart username and password, and select [facility] as your healthcare provider to link the account.  - When it is time for your visit, go to the MyChart app, find appointments, and click Begin Video Visit. Be sure to Select Allow for your device to  access the Microphone and Camera for your visit. You will then be connected, and your provider will be with you shortly.  **If you have any issues connecting or need assistance, please contact MyChart service desk [phone] ([phone])**  **If using a computer, in order to ensure the best quality for your visit, you will need to use either of the following Internet Browsers: Google Chrome or Microsoft Edge**  . IF USING DOXIMITY or [email or web address].ME - The staff will give you instructions on receiving your link to join the meeting the day of your visit.      2-3 DAYS BEFORE YOUR APPOINTMENT  You will receive a telephone call from one of our [facility] team members - your caller ID may say "Unknown caller." If this is a video visit, we will walk you through how to get the video launched on your phone. We will remind you check your blood pressure, heart rate and weight prior to your scheduled appointment. If you have an Apple Watch or Kardia, please upload any pertinent ECG strips the day before or morning of your appointment to MyChart. Our staff will also make sure you have reviewed the consent and agree to move forward with your scheduled tele-health visit.     THE DAY OF YOUR APPOINTMENT  Approximately 15 minutes prior to your scheduled appointment, you will receive a telephone call from one of Heart[facility] team - your caller ID may say "Unknown caller."    Our staff will confirm medications, vital signs for the day and any symptoms you may be experiencing. Please have this information available prior to the time of visit start. It may also be helpful for you to have a pad of paper and pen handy for any instructions given during your visit. They will also walk you through joining the smartphone meeting if this is a video visit.    CONSENT FOR TELE-HEALTH VISIT - PLEASE REVIEW  I hereby voluntarily request, consent and authorize [facility] and its employed or contracted physicians, physician  assistants, nurse practitioners or other licensed health care professionals (the Practitioner), to provide me with telemedicine health care services (the "Services") as deemed necessary by the treating Practitioner. I acknowledge and consent to receive the Services by the Practitioner via telemedicine. I understand that the telemedicine visit will involve communicating with the Practitioner through live audiovisual communication technology and the disclosure of certain medical information by electronic transmission. I acknowledge that I have been given the opportunity to request an in-person assessment or other available alternative prior to the telemedicine visit and am voluntarily participating in the telemedicine visit.  I understand that I have the right to withhold or withdraw my consent to the use of telemedicine in the course of my care at any time, without affecting my right to future care or treatment, and that the Practitioner or I may terminate the telemedicine visit at any time. I understand that I have the right to inspect all information obtained and/or recorded in the course of the telemedicine visit and may receive copies of available information for a reasonable fee.  I understand that some of the potential risks of receiving the Services via telemedicine include:  . Delay or interruption in medical evaluation due to technological equipment failure or disruption; . Information transmitted may not be sufficient (e.g. poor resolution of images) to allow for appropriate medical decision making by the Practitioner; and/or  . In rare instances, security protocols could fail, causing a breach of personal health information.  Furthermore, I acknowledge that it is my responsibility to provide information about my medical history, conditions and care that is complete and accurate to the best of my ability. I acknowledge that Practitioner's advice, recommendations, and/or decision may be based on  factors not within their control, such as incomplete or inaccurate data provided by me or distortions of diagnostic images or specimens that may result from electronic transmissions. I understand that the practice of medicine is not an exact science and that Practitioner makes no warranties or guarantees regarding treatment outcomes. I acknowledge that I will receive a copy of this consent concurrently upon execution via email to the email address I last provided but may also request a printed copy by calling the office of [facility].    I understand that my insurance will be billed for this visit.   I have read or had this consent read to me. . I understand the contents of this consent, which adequately explains the benefits and risks of the Services being provided via telemedicine.  . I have been provided ample opportunity to ask questions regarding this consent and the Services and have had my questions answered to my satisfaction. . I give my informed consent for the services to be provided through the use of telemedicine in my medical care  By participating in this telemedicine visit I agree to the above.  

## 2019-02-08 ENCOUNTER — Encounter: Payer: Medicaid Other | Admitting: Physician Assistant

## 2019-02-08 ENCOUNTER — Other Ambulatory Visit: Payer: Self-pay

## 2019-02-08 ENCOUNTER — Telehealth: Payer: Self-pay

## 2019-02-08 NOTE — Progress Notes (Signed)
error 

## 2019-02-08 NOTE — Telephone Encounter (Signed)
Called patient several times and left message for patient to call back regarding virtual appointment with Robbie Lis, PA on 7/22 at 1 pm.

## 2019-02-09 ENCOUNTER — Telehealth: Payer: Self-pay | Admitting: Cardiovascular Disease

## 2019-02-09 ENCOUNTER — Ambulatory Visit: Payer: Medicaid Other | Admitting: Family Medicine

## 2019-02-09 MED ORDER — CARVEDILOL 25 MG PO TABS: 25.0000 mg | ORAL_TABLET | Freq: Two times a day (BID) | ORAL | 4 refills | Status: DC

## 2019-02-09 NOTE — Telephone Encounter (Signed)
 *  STAT* If patient is at the pharmacy, call can be transferred to refill team.   1. Which medications need to be refilled? (please list name of each medication and dose if known) carvedilol (COREG) 25 MG tablet  2. Which pharmacy/location (including street and city if local pharmacy) is medication to be sent to? [facility]  3. Do they need a 30 day or 90 day supply? 30  Pharmacy states they did not receive the prescription on 01/30/19. Please resend

## 2019-02-09 NOTE — Telephone Encounter (Signed)
**Note De-Identified Azusena Erlandson Obfuscation** I called [place] on [facility] and was advised that a 30 day supply (#60 tablets) of Carvedilol will cost the pt $4 as it is on their $4v medications list.  I called the pt back to make her aware and to see if she can afford $4 for the 60 tablets she is lacking.  She stated "you are not going to believe this but I just found a whole bottle of Carvedilol with 60 tablets in it in my cabinet". She states that she is ok now and verified that she has enough Carvedilol to last until 8/23 when she can get her next refill.  She thanked me for helping her.

## 2019-02-09 NOTE — Telephone Encounter (Addendum)
I re-faxed this Carvedilol RX to [facility] and then called them to verify that they received it. I s/w Mo who states that Medicaid will not pay for a refill at this time because the pt is requesting it too soon. Mo reports that the pt picked up a 90 day supply of her Carvedilol on 12/10/2018 and that she should have another months worth of Carvedilol on hand.  I called the pt who states that she takes 2 tablets daily of her Carvedilol and that [facility] shorted her on her last refill and must have only given her 120 and not 180 tablets. I checked the pts refill history to be sure we sent her refills in correctly and we have.  The pt is aware that once she pays for her medication refills it is her responsibility if the count is wrong according to her pharmacy.  She states that she cannot afford the $51.99 that [facility] is telling her it will cost her out of pocket.  I advised her that I would call her back with recommendation shortly.

## 2019-02-20 ENCOUNTER — Ambulatory Visit: Payer: Medicaid Other | Admitting: Family Medicine

## 2019-02-21 ENCOUNTER — Encounter

## 2019-02-21 ENCOUNTER — Encounter: Payer: Medicaid Other | Attending: Psychology | Admitting: Psychology

## 2019-02-28 NOTE — Progress Notes (Deleted)
Cardiology Office Note    Date:  02/28/2019   ID:  Karen Ochoa, DOB 1966-04-08, MRN [id number]  PCP:  Lanae Boast, FNP  Cardiologist:  Dr. Acie Fredrickson  Chief Complaint: 6 Months follow up  History of Present Illness:   Karen Ochoa is a 53 y.o. female with hx of CAD s/p CABG, ICM with improved LV function, CKD stage III HTN, stroke with left sided residual weakness and smoker *** seen for follow up.   She had a CVA on Christmas of 2018.  She had coronary artery bypass grafting in March, 2019. She was doing well on cardiac stand point when last seen by Dr. Acie Fredrickson 06/2018.  LVEF was 30-35% with global hypokinesis by echo which improved to 50-55% by TEE 09/2017.  Here today for follow up.    Past Medical History:  Diagnosis Date  . Asthma 2017  . Fibroids   . GERD (gastroesophageal reflux disease)   . Headache    "weekly" (07/20/2017)  . High cholesterol 07/18/2017  . Hx MRSA infection   . Hypertension   . Insomnia due to medical condition   . Stroke ([place]) 07/18/2017  . TIA (transient ischemic attack)    "I've had several"    Past Surgical History:  Procedure Laterality Date  . CORONARY ARTERY BYPASS GRAFT N/A 10/07/2017   Procedure: CORONARY ARTERY BYPASS GRAFTING (CABG) ON PUMP TIMES 5 USING LEFT INTERNAL MAMMARY ARTERY AND BILATERAL GREATER SAPHENOUS VEIN VIA ENDOHARDEST.;  Surgeon: Gaye Pollack, MD;  Location: [facility];  Service: Open Heart Surgery;  Laterality: N/A;  . RIGHT/LEFT HEART CATH AND CORONARY ANGIOGRAPHY N/A 10/05/2017   Procedure: RIGHT/LEFT HEART CATH AND CORONARY ANGIOGRAPHY;  Surgeon: Belva Crome, MD;  Location: [facility];  Service: Cardiovascular;  Laterality: N/A;  . TEE WITHOUT CARDIOVERSION N/A 10/07/2017   Procedure: TRANSESOPHAGEAL ECHOCARDIOGRAM (TEE);  Surgeon: Gaye Pollack, MD;  Location: [facility];  Service: Open Heart Surgery;  Laterality: N/A;  . WISDOM TOOTH EXTRACTION      Current Medications: Prior to Admission medications    Medication Sig Start Date End Date Taking? Authorizing Provider  albuterol (VENTOLIN HFA) 108 (90 Base) MCG/ACT inhaler INHALE 2 PUFFS INTO THE LUNGS EVERY 6 (SIX) HOURS AS NEEDED FOR WHEEZING OR SHORTNESS OF BREATH. 05/11/18   Lanae Boast, FNP  amLODipine (NORVASC) 10 MG tablet TAKE 1 TABLET BY MOUTH ONCE DAILY 07/06/18   Imogene Burn, PA-C  aspirin EC 81 MG tablet Take 1 tablet (81 mg total) by mouth daily. 06/28/18   Nahser, Wonda Cheng, MD  atorvastatin (LIPITOR) 80 MG tablet TAKE 1 TABLET BY MOUTH EVERY DAY AT 6 PM 08/29/18   Imogene Burn, PA-C  azelastine (ASTELIN) 0.1 % nasal spray Place 2 sprays into both nostrils 2 (two) times daily. Use in each nostril as directed 08/11/18   Lanae Boast, FNP  benzonatate (TESSALON) 100 MG capsule Take 1 capsule (100 mg total) by mouth 2 (two) times daily as needed for cough. 08/11/18   Lanae Boast, FNP  carvedilol (COREG) 25 MG tablet Take 1 tablet (25 mg total) by mouth 2 (two) times daily with a meal. 02/09/19   Nahser, Wonda Cheng, MD  doxycycline (VIBRA-TABS) 100 MG tablet Take 1 tablet (100 mg total) by mouth 2 (two) times daily. 08/11/18   Lanae Boast, FNP  famotidine (PEPCID) 20 MG tablet Take 1 tablet (20 mg total) by mouth 2 (two) times daily. 11/24/18   Azzie Glatter, FNP  furosemide (LASIX) 20 MG tablet Take 1 tablet (20 mg total) by mouth daily. 11/10/18   Lanae Boast, FNP  ibuprofen (ADVIL,MOTRIN) 800 MG tablet TAKE 1 TABLET(800 MG) BY MOUTH EVERY 8 HOURS AS NEEDED 05/23/18   Lanae Boast, FNP  losartan (COZAAR) 25 MG tablet TAKE 1 TABLET(25 MG) BY MOUTH DAILY 12/16/18   Lanae Boast, FNP  methylPREDNISolone (MEDROL DOSEPAK) 4 MG TBPK tablet Take as directed on pack 08/11/18   Lanae Boast, [place]. Devices (ROLLATOR ULTRA-LIGHT) MISC Use daily to aid in ambulation 01/18/19   Lanae Boast, FNP  traZODone (DESYREL) 100 MG tablet Take 1 tablet (100 mg total) by mouth at bedtime for 30 days. 09/19/18 10/19/18  Lanae Boast, FNP     Allergies:   Penicillins, Sulfonamide derivatives, Clonidine derivatives, Flagyl [metronidazole], and Labetalol   Social History   Socioeconomic History  . Marital status: Single    Spouse name: Not on file  . Number of children: Not on file  . Years of education: Not on file  . Highest education level: Not on file  Occupational History  . Not on file  Social Needs  . Financial resource strain: Not on file  . Food insecurity    Worry: Not on file    Inability: Not on file  . Transportation needs    Medical: Not on file    Non-medical: Not on file  Tobacco Use  . Smoking status: Current Every Day Smoker    Packs/day: 0.50    Years: 10.00    Pack years: 5.00    Types: Cigarettes  . Smokeless tobacco: Never Used  . Tobacco comment: 2 to 3 per day  Substance and Sexual Activity  . Alcohol use: No    Alcohol/week: 2.0 standard drinks    Types: 2 Glasses of wine per week    Frequency: Never    Comment: 08/04/2017--states no   . Drug use: No  . Sexual activity: Yes    Birth control/protection: None  Lifestyle  . Physical activity    Days per week: Not on file    Minutes per session: Not on file  . Stress: Not on file  Relationships  . Social Herbalist on phone: Not on file    Gets together: Not on file    Attends religious service: Not on file    Active member of club or organization: Not on file    Attends meetings of clubs or organizations: Not on file    Relationship status: Not on file  Other Topics Concern  . Not on file  Social History Narrative  . Not on file     Family History:  The patient's family history includes Asthma in her brother; Diabetes in her brother and sister; Hypertension in her mother.   ROS:   Please see the history of present illness.    ROS All other systems reviewed and are negative.   PHYSICAL EXAM:   VS:  LMP 05/19/2013    GEN: Well nourished, well developed, in no acute distress  HEENT: normal  Neck: no JVD,  carotid bruits, or masses Cardiac: ***RRR; no murmurs, rubs, or gallops,no edema  Respiratory:  clear to auscultation bilaterally, normal work of breathing GI: soft, nontender, nondistended, + BS MS: no deformity or atrophy  Skin: warm and dry, no rash Neuro:  Alert and Oriented x 3, Strength and sensation are intact Psych: euthymic mood, full affect  Wt Readings from Last 3 Encounters:  08/11/18  210 lb (95.3 kg)  07/28/18 201 lb (91.2 kg)  06/28/18 199 lb (90.3 kg)      Studies/Labs Reviewed:   EKG:  EKG is ordered today.  The ekg ordered today demonstrates ***  Recent Labs: 11/24/2018: ALT 14; BUN 14; Creatinine, Ser 1.27; Potassium 3.7; Sodium 143   Lipid Panel    Component Value Date/Time   CHOL 122 03/29/2018 1559   TRIG 125 03/29/2018 1559   HDL 44 03/29/2018 1559   CHOLHDL 2.8 03/29/2018 1559   CHOLHDL 4.7 07/19/2017 0556   VLDL 42 (H) 07/19/2017 0556   LDLCALC 53 03/29/2018 1559    Additional studies/ records that were reviewed today include:   Echocardiogram: 07/2017 Study Conclusions  - Left ventricle: The cavity size was normal. There was severe   concentric hypertrophy. Systolic function was moderately to   severely reduced. The estimated ejection fraction was in the   range of 30% to 35%. Diffuse hypokinesis. Doppler parameters are   consistent with abnormal left ventricular relaxation (grade 1   diastolic dysfunction). Doppler parameters are consistent with   elevated ventricular end-diastolic filling pressure. - Aortic valve: There was trivial regurgitation. - Aortic root: The aortic root was normal in size. - Mitral valve: There was mild regurgitation directed posteriorly. - Left atrium: The atrium was mildly dilated. - Right ventricle: The cavity size was normal. Wall thickness was   normal. Systolic function was normal. - Right atrium: The atrium was normal in size. - Tricuspid valve: There was mild regurgitation. - Pulmonic valve: There was  trivial regurgitation. - Pulmonary arteries: Systolic pressure was within the normal   range. - Inferior vena cava: The vessel was normal in size. - Pericardium, extracardiac: There was no pericardial effusion.  Impressions:  - Severe concentric LVH with diffuse hypokinesis and LVEF 30-35%. A   cardiac MRI is recommended to differentiate hypertensive vs   infiltrative cardiomyopathy.  RIGHT/LEFT HEART CATH AND CORONARY ANGIOGRAPHY 09/2017  Conclusion   Multivessel coronary artery disease, with Syntax I score of 29.  Normal left main  Segmental calcified 85% mid LAD stenosis forming a [place] 011 bifurcation lesion with a large first diagonal.  LAD wraps around left ventricular apex and supplies collaterals to a small diffusely diseased PDA.  The circumflex is a codominant system with the right coronary and has severe disease in the first (90%) and second (70%) obtuse marginal branches beyond moderate to severe tortuosity.  The mid circumflex between the first and second obtuse marginals contains diffuse disease with up to 80% stenosis.  Right coronary is totally occluded proximally with distal continuation supplied by collaterals from the circumflex.  Inferior basal wall severe hypokinesis.  Overall normal EF of 55%.  Normal LVEDP.  Normal pulmonary artery pressures.  Pulmonary capillary wedge pressure 24.  LVEDP is normal.  RECOMMENDATIONS:   Relatively young female with significant comorbidity and multivessel coronary disease with intermediate risk for PCI based on Syntax I score.  Further uptitrate statin intensity  Evaluation for multivessel CABG.       ASSESSMENT & PLAN:    1. CAD s/p CABG  2. HTN  3. ICM - LVEF was 30-35% with global hypokinesis by echo which improved to 50-55% by TEE 09/2017.  4. CKD stage III    Medication Adjustments/Labs and Tests Ordered: Current medicines are reviewed at length with the patient today.  Concerns regarding  medicines are outlined above.  Medication changes, Labs and Tests ordered today are listed in the Patient Instructions below.  There are no Patient Instructions on file for this visit.   Jarrett Soho, [place]  02/28/2019 4:33 PM    [facility] [address] [place] [place], [place], [place]  [address] Phone: [phone]; Fax: [phone]

## 2019-03-01 ENCOUNTER — Ambulatory Visit: Payer: Medicaid Other | Admitting: Physician Assistant

## 2019-03-03 ENCOUNTER — Other Ambulatory Visit: Payer: Self-pay | Admitting: Family Medicine

## 2019-03-03 DIAGNOSIS — G4700 Insomnia, unspecified: Secondary | ICD-10-CM

## 2019-03-17 ENCOUNTER — Other Ambulatory Visit: Payer: Self-pay | Admitting: Family Medicine

## 2019-03-29 ENCOUNTER — Encounter (HOSPITAL_COMMUNITY): Payer: Self-pay

## 2019-03-29 ENCOUNTER — Encounter (HOSPITAL_COMMUNITY): Payer: Self-pay | Admitting: *Deleted

## 2019-04-05 ENCOUNTER — Telehealth: Payer: Self-pay | Admitting: Family Medicine

## 2019-04-05 NOTE — Telephone Encounter (Signed)
Called and spoke with patient. She is complaining of sore throat, cough, congestion and wanted [place] to call in medications for her. I advised she will need to be seen and with her symptoms we are not seeing patients in the office with those symptoms. Asked that she go to urgent Care. Thanks!

## 2019-04-17 ENCOUNTER — Ambulatory Visit: Payer: Medicaid Other | Admitting: Physician Assistant

## 2019-04-17 NOTE — Progress Notes (Deleted)
Cardiology Office Note    Date:  04/17/2019   ID:  Karen Ochoa, DOB December 29, 1965, MRN [id number]  PCP:  Lanae Boast, FNP  Cardiologist: Dr. Acie Fredrickson  Chief Complaint: 6  Months follow up  History of Present Illness:   Karen Ochoa is a 53 y.o. female with hx of CAD s/p CABG 09/2017, CVA 06/2017 with residual left side weakness, HTN, CKD III, ICM, smoker and HLD seen for follow up.  He was doing well on cardiac stand point when last seen by Dr. Acie Fredrickson 06/2018.  Seen today for follow up.    Past Medical History:  Diagnosis Date  . Asthma 2017  . Fibroids   . GERD (gastroesophageal reflux disease)   . Headache    "weekly" (07/20/2017)  . High cholesterol 07/18/2017  . Hx MRSA infection   . Hypertension   . Insomnia due to medical condition   . Stroke ([place]) 07/18/2017  . TIA (transient ischemic attack)    "I've had several"    Past Surgical History:  Procedure Laterality Date  . CORONARY ARTERY BYPASS GRAFT N/A 10/07/2017   Procedure: CORONARY ARTERY BYPASS GRAFTING (CABG) ON PUMP TIMES 5 USING LEFT INTERNAL MAMMARY ARTERY AND BILATERAL GREATER SAPHENOUS VEIN VIA ENDOHARDEST.;  Surgeon: Gaye Pollack, MD;  Location: [facility];  Service: Open Heart Surgery;  Laterality: N/A;  . RIGHT/LEFT HEART CATH AND CORONARY ANGIOGRAPHY N/A 10/05/2017   Procedure: RIGHT/LEFT HEART CATH AND CORONARY ANGIOGRAPHY;  Surgeon: Belva Crome, MD;  Location: [facility];  Service: Cardiovascular;  Laterality: N/A;  . TEE WITHOUT CARDIOVERSION N/A 10/07/2017   Procedure: TRANSESOPHAGEAL ECHOCARDIOGRAM (TEE);  Surgeon: Gaye Pollack, MD;  Location: [facility];  Service: Open Heart Surgery;  Laterality: N/A;  . WISDOM TOOTH EXTRACTION      Current Medications: Prior to Admission medications   Medication Sig Start Date End Date Taking? Authorizing Provider  albuterol (VENTOLIN HFA) 108 (90 Base) MCG/ACT inhaler INHALE 2 PUFFS INTO THE LUNGS EVERY 6 (SIX) HOURS AS NEEDED FOR WHEEZING OR  SHORTNESS OF BREATH. 05/11/18   Lanae Boast, FNP  amLODipine (NORVASC) 10 MG tablet TAKE 1 TABLET BY MOUTH ONCE DAILY 07/06/18   Imogene Burn, PA-C  aspirin EC 81 MG tablet Take 1 tablet (81 mg total) by mouth daily. 06/28/18   Nahser, Wonda Cheng, MD  atorvastatin (LIPITOR) 80 MG tablet TAKE 1 TABLET BY MOUTH EVERY DAY AT 6 PM 08/29/18   Imogene Burn, PA-C  azelastine (ASTELIN) 0.1 % nasal spray Place 2 sprays into both nostrils 2 (two) times daily. Use in each nostril as directed 08/11/18   Lanae Boast, FNP  benzonatate (TESSALON) 100 MG capsule Take 1 capsule (100 mg total) by mouth 2 (two) times daily as needed for cough. 08/11/18   Lanae Boast, FNP  carvedilol (COREG) 25 MG tablet Take 1 tablet (25 mg total) by mouth 2 (two) times daily with a meal. 02/09/19   Nahser, Wonda Cheng, MD  doxycycline (VIBRA-TABS) 100 MG tablet Take 1 tablet (100 mg total) by mouth 2 (two) times daily. 08/11/18   Lanae Boast, FNP  famotidine (PEPCID) 20 MG tablet Take 1 tablet (20 mg total) by mouth 2 (two) times daily. 11/24/18   Azzie Glatter, FNP  furosemide (LASIX) 20 MG tablet Take 1 tablet (20 mg total) by mouth daily. 11/10/18   Lanae Boast, FNP  ibuprofen (ADVIL,MOTRIN) 800 MG tablet TAKE 1 TABLET(800 MG) BY MOUTH EVERY 8 HOURS AS NEEDED 05/23/18  Lanae Boast, FNP  losartan (COZAAR) 25 MG tablet TAKE 1 TABLET(25 MG) BY MOUTH DAILY 03/17/19   Lanae Boast, FNP  methylPREDNISolone (MEDROL DOSEPAK) 4 MG TBPK tablet Take as directed on pack 08/11/18   Lanae Boast, [place]. Devices (ROLLATOR ULTRA-LIGHT) MISC Use daily to aid in ambulation 01/18/19   Lanae Boast, FNP  traZODone (DESYREL) 100 MG tablet TAKE 1 TABLET(100 MG) BY MOUTH AT BEDTIME 03/03/19   Lanae Boast, FNP    Allergies:   Penicillins, Sulfonamide derivatives, Clonidine derivatives, Flagyl [metronidazole], and Labetalol   Social History   Socioeconomic History  . Marital status: Single    Spouse name: Not on file  .  Number of children: Not on file  . Years of education: Not on file  . Highest education level: Not on file  Occupational History  . Not on file  Social Needs  . Financial resource strain: Not on file  . Food insecurity    Worry: Not on file    Inability: Not on file  . Transportation needs    Medical: Not on file    Non-medical: Not on file  Tobacco Use  . Smoking status: Current Every Day Smoker    Packs/day: 0.50    Years: 10.00    Pack years: 5.00    Types: Cigarettes  . Smokeless tobacco: Never Used  . Tobacco comment: 2 to 3 per day  Substance and Sexual Activity  . Alcohol use: No    Alcohol/week: 2.0 standard drinks    Types: 2 Glasses of wine per week    Frequency: Never    Comment: 08/04/2017--states no   . Drug use: No  . Sexual activity: Yes    Birth control/protection: None  Lifestyle  . Physical activity    Days per week: Not on file    Minutes per session: Not on file  . Stress: Not on file  Relationships  . Social Herbalist on phone: Not on file    Gets together: Not on file    Attends religious service: Not on file    Active member of club or organization: Not on file    Attends meetings of clubs or organizations: Not on file    Relationship status: Not on file  Other Topics Concern  . Not on file  Social History Narrative  . Not on file     Family History:  The patient's family history includes Asthma in her brother; Diabetes in her brother and sister; Hypertension in her mother. ***  ROS:   Please see the history of present illness.    ROS All other systems reviewed and are negative.   PHYSICAL EXAM:   VS:  LMP 05/19/2013    GEN: Well nourished, well developed, in no acute distress  HEENT: normal  Neck: no JVD, carotid bruits, or masses Cardiac: ***RRR; no murmurs, rubs, or gallops,no edema  Respiratory:  clear to auscultation bilaterally, normal work of breathing GI: soft, nontender, nondistended, + BS MS: no deformity or  atrophy  Skin: warm and dry, no rash Neuro:  Alert and Oriented x 3, Strength and sensation are intact Psych: euthymic mood, full affect  Wt Readings from Last 3 Encounters:  08/11/18 210 lb (95.3 kg)  07/28/18 201 lb (91.2 kg)  06/28/18 199 lb (90.3 kg)      Studies/Labs Reviewed:   EKG:  EKG is ordered today.  The ekg ordered today demonstrates ***  Recent Labs: 11/24/2018: ALT 14; BUN  14; Creatinine, Ser 1.27; Potassium 3.7; Sodium 143   Lipid Panel    Component Value Date/Time   CHOL 122 03/29/2018 1559   TRIG 125 03/29/2018 1559   HDL 44 03/29/2018 1559   CHOLHDL 2.8 03/29/2018 1559   CHOLHDL 4.7 07/19/2017 0556   VLDL 42 (H) 07/19/2017 0556   LDLCALC 53 03/29/2018 1559    Additional studies/ records that were reviewed today include:   Echocardiogram: 07/2017 - Left ventricle: The cavity size was normal. There was severe   concentric hypertrophy. Systolic function was moderately to   severely reduced. The estimated ejection fraction was in the   range of 30% to 35%. Diffuse hypokinesis. Doppler parameters are   consistent with abnormal left ventricular relaxation (grade 1   diastolic dysfunction). Doppler parameters are consistent with   elevated ventricular end-diastolic filling pressure. - Aortic valve: There was trivial regurgitation. - Aortic root: The aortic root was normal in size. - Mitral valve: There was mild regurgitation directed posteriorly. - Left atrium: The atrium was mildly dilated. - Right ventricle: The cavity size was normal. Wall thickness was   normal. Systolic function was normal. - Right atrium: The atrium was normal in size. - Tricuspid valve: There was mild regurgitation. - Pulmonic valve: There was trivial regurgitation. - Pulmonary arteries: Systolic pressure was within the normal   range. - Inferior vena cava: The vessel was normal in size. - Pericardium, extracardiac: There was no pericardial effusion.  Impressions:  - Severe  concentric LVH with diffuse hypokinesis and LVEF 30-35%. A   cardiac MRI is recommended to differentiate hypertensive vs   infiltrative cardiomyopathy.  RIGHT/LEFT HEART CATH AND CORONARY ANGIOGRAPHY 09/2017  Conclusion   Multivessel coronary artery disease, with Syntax I score of 29.  Normal left main  Segmental calcified 85% mid LAD stenosis forming a [place] 011 bifurcation lesion with a large first diagonal.  LAD wraps around left ventricular apex and supplies collaterals to a small diffusely diseased PDA.  The circumflex is a codominant system with the right coronary and has severe disease in the first (90%) and second (70%) obtuse marginal branches beyond moderate to severe tortuosity.  The mid circumflex between the first and second obtuse marginals contains diffuse disease with up to 80% stenosis.  Right coronary is totally occluded proximally with distal continuation supplied by collaterals from the circumflex.  Inferior basal wall severe hypokinesis.  Overall normal EF of 55%.  Normal LVEDP.  Normal pulmonary artery pressures.  Pulmonary capillary wedge pressure 24.  LVEDP is normal.  RECOMMENDATIONS:   Relatively young female with significant comorbidity and multivessel coronary disease with intermediate risk for PCI based on Syntax I score.  Further uptitrate statin intensity  Evaluation for multivessel CABG.       ASSESSMENT & PLAN:    1. CAD s/p CABG  2. HTN  3. HLD - No results found for requested labs within last 8760 hours.  4. ICM  Echo prior to CABG in 07/2017 showed LVEF of 30-35%  5. CKD III  6. Tobacco smoking  Medication Adjustments/Labs and Tests Ordered: Current medicines are reviewed at length with the patient today.  Concerns regarding medicines are outlined above.  Medication changes, Labs and Tests ordered today are listed in the Patient Instructions below. There are no Patient Instructions on file for this visit.   Jarrett Soho, [place]  04/17/2019 11:24 AM    [facility] [place], [place], [place]  [address] Phone: [phone])  [phone]; Fax: [phone]

## 2019-04-19 ENCOUNTER — Ambulatory Visit: Payer: Medicaid Other | Admitting: Adult Health

## 2019-04-19 ENCOUNTER — Encounter

## 2019-05-09 ENCOUNTER — Ambulatory Visit: Payer: Medicaid Other | Admitting: Family Medicine

## 2019-06-17 ENCOUNTER — Other Ambulatory Visit: Payer: Self-pay | Admitting: Family Medicine

## 2019-06-19 ENCOUNTER — Other Ambulatory Visit: Payer: Self-pay | Admitting: Cardiovascular Disease

## 2019-06-19 ENCOUNTER — Other Ambulatory Visit: Payer: Self-pay

## 2019-06-19 MED ORDER — ATORVASTATIN CALCIUM 80 MG PO TABS: ORAL_TABLET | ORAL | 0 refills | Status: DC

## 2019-06-24 ENCOUNTER — Other Ambulatory Visit: Payer: Self-pay | Admitting: Physician Assistant

## 2019-06-25 NOTE — Progress Notes (Signed)
Cardiology Office Note:    Date:  06/26/2019   ID:  RESHMA HOEY, DOB Jun 24, 1966, MRN [id number]  PCP:  [name], FNP  Cardiologist:  Mertie Moores, MD  Electrophysiologist:  None   Referring MD: Tresa Garter, MD   Chief Complaint  Patient presents with  . Coronary Artery Disease     Dec. 10, 2019    Karen Ochoa is a 53 y.o. female with a hx of  CAD and CABG.  I met her in the hospital this past year   She had a CVA on Christmas of 2018.  She had coronary artery bypass grafting in March, 2019. Still has a lot of residual left-sided weakness from her stroke. Living at home.  She denies having any angina pain.  She does have some musculoskeletal soreness when it rains.  Dec. 7, 2020  Karen Ochoa is seen today for follow up visit  Hx of CAD and CABG   No exercise, Wants to get a treadmill .  No cp or dyspnea Still smoking     Past Medical History:  Diagnosis Date  . Asthma 2017  . Fibroids   . GERD (gastroesophageal reflux disease)   . Headache    "weekly" (07/20/2017)  . High cholesterol 07/18/2017  . Hx MRSA infection   . Hypertension   . Insomnia due to medical condition   . Stroke ([place]) 07/18/2017  . TIA (transient ischemic attack)    "I've had several"    Past Surgical History:  Procedure Laterality Date  . CORONARY ARTERY BYPASS GRAFT N/A 10/07/2017   Procedure: CORONARY ARTERY BYPASS GRAFTING (CABG) ON PUMP TIMES 5 USING LEFT INTERNAL MAMMARY ARTERY AND BILATERAL GREATER SAPHENOUS VEIN VIA ENDOHARDEST.;  Surgeon: Gaye Pollack, MD;  Location: [facility];  Service: Open Heart Surgery;  Laterality: N/A;  . RIGHT/LEFT HEART CATH AND CORONARY ANGIOGRAPHY N/A 10/05/2017   Procedure: RIGHT/LEFT HEART CATH AND CORONARY ANGIOGRAPHY;  Surgeon: Belva Crome, MD;  Location: [facility];  Service: Cardiovascular;  Laterality: N/A;  . TEE WITHOUT CARDIOVERSION N/A 10/07/2017   Procedure: TRANSESOPHAGEAL ECHOCARDIOGRAM (TEE);  Surgeon: Gaye Pollack, MD;  Location: [facility];  Service: Open Heart Surgery;  Laterality: N/A;  . WISDOM TOOTH EXTRACTION      Current Medications: Current Meds  Medication Sig  . albuterol (VENTOLIN HFA) 108 (90 Base) MCG/ACT inhaler INHALE 2 PUFFS INTO THE LUNGS EVERY 6 (SIX) HOURS AS NEEDED FOR WHEEZING OR SHORTNESS OF BREATH.  Marland Kitchen amLODipine (NORVASC) 10 MG tablet TAKE 1 TABLET BY MOUTH ONCE DAILY  . aspirin EC 81 MG tablet Take 1 tablet (81 mg total) by mouth daily.  Marland Kitchen atorvastatin (LIPITOR) 80 MG tablet TAKE 1 TABLET BY MOUTH EVERY DAY AT 6 PM. Please keep upcoming appt in December for future refills. Thank you  . azelastine (ASTELIN) 0.1 % nasal spray Place 2 sprays into both nostrils 2 (two) times daily. Use in each nostril as directed  . carvedilol (COREG) 25 MG tablet Take 1 tablet (25 mg total) by mouth 2 (two) times daily with a meal.  . famotidine (PEPCID) 20 MG tablet Take 1 tablet (20 mg total) by mouth 2 (two) times daily.  Marland Kitchen losartan (COZAAR) 25 MG tablet TAKE 1 TABLET(25 MG) BY MOUTH DAILY  . traZODone (DESYREL) 100 MG tablet TAKE 1 TABLET(100 MG) BY MOUTH AT BEDTIME     Allergies:   Penicillins, Sulfonamide derivatives, Clonidine derivatives, Flagyl [metronidazole], and Labetalol   Social History  Socioeconomic History  . Marital status: Single    Spouse name: Not on file  . Number of children: Not on file  . Years of education: Not on file  . Highest education level: Not on file  Occupational History  . Not on file  Social Needs  . Financial resource strain: Not on file  . Food insecurity    Worry: Not on file    Inability: Not on file  . Transportation needs    Medical: Not on file    Non-medical: Not on file  Tobacco Use  . Smoking status: Current Every Day Smoker    Packs/day: 0.50    Years: 10.00    Pack years: 5.00    Types: Cigarettes  . Smokeless tobacco: Never Used  . Tobacco comment: 2 to 3 per day  Substance and Sexual Activity  . Alcohol use: No     Alcohol/week: 2.0 standard drinks    Types: 2 Glasses of wine per week    Frequency: Never    Comment: 08/04/2017--states no   . Drug use: No  . Sexual activity: Yes    Birth control/protection: None  Lifestyle  . Physical activity    Days per week: Not on file    Minutes per session: Not on file  . Stress: Not on file  Relationships  . Social Herbalist on phone: Not on file    Gets together: Not on file    Attends religious service: Not on file    Active member of club or organization: Not on file    Attends meetings of clubs or organizations: Not on file    Relationship status: Not on file  Other Topics Concern  . Not on file  Social History Narrative  . Not on file     Family History: The patient's family history includes Asthma in her brother; Diabetes in her brother and sister; Hypertension in her mother. There is no history of Anesthesia problems.  ROS:   Please see the history of present illness.     All other systems reviewed and are negative.  EKGs/Labs/Other Studies Reviewed:    The following studies were reviewed today:   EKG:   NSR at 71,  Normal ECG   Recent Labs: 11/24/2018: ALT 14; BUN 14; Creatinine, Ser 1.27; Potassium 3.7; Sodium 143  Recent Lipid Panel    Component Value Date/Time   CHOL 122 03/29/2018 1559   TRIG 125 03/29/2018 1559   HDL 44 03/29/2018 1559   CHOLHDL 2.8 03/29/2018 1559   CHOLHDL 4.7 07/19/2017 0556   VLDL 42 (H) 07/19/2017 0556   LDLCALC 53 03/29/2018 1559    Physical Exam:    VS:  BP 100/82   Pulse 71   Ht _0  (1.702 m)   Wt 198 lb (89.8 kg)   LMP 05/19/2013   SpO2 99%   BMI 31.01 kg/m     Wt Readings from Last 3 Encounters:  06/26/19 198 lb (89.8 kg)  08/11/18 210 lb (95.3 kg)  07/28/18 201 lb (91.2 kg)    Physical Exam: Blood pressure 100/82, pulse 71, height _1  (1.702 m), weight 198 lb (89.8 kg), last menstrual period 05/19/2013, SpO2 99 %.  GEN:  Well nourished, well developed in no acute  distress HEENT: Normal NECK: No JVD; No carotid bruits LYMPHATICS: No lymphadenopathy CARDIAC: RRR , no murmurs, rubs, gallops RESPIRATORY:  Clear to auscultation without rales, wheezing or rhonchi  ABDOMEN: Soft, non-tender, non-distended MUSCULOSKELETAL:  No edema; No deformity  SKIN: Warm and dry NEUROLOGIC:  Alert and oriented x 3   ASSESSMENT:    1. Coronary artery disease involving native coronary artery of native heart without angina pectoris   2. Hyperlipidemia LDL goal <70   3. Essential hypertension    PLAN:    In order of problems listed above:  1.  Coronary artery disease:  No angina .   Cont current meds   2.  History of stroke:  Overall doing ok  Still smoking.   We discussed cessation.    Medication Adjustments/Labs and Tests Ordered: Current medicines are reviewed at length with the patient today.  Concerns regarding medicines are outlined above.  Orders Placed This Encounter  Procedures  . Lipid Profile  . Basic Metabolic Panel (BMET)  . Hepatic function panel  . EKG 12-Lead   No orders of the defined types were placed in this encounter.   Patient Instructions  Medication Instructions:  Your physician recommends that you continue on your current medications as directed. Please refer to the Current Medication list given to you today.  *If you need a refill on your cardiac medications before your next appointment, please call your pharmacy*  Lab Work: TODAY - cholesterol, liver panel, basic metabolic panel If you have labs (blood work) drawn today and your tests are completely normal, you will receive your results only by: Marland Kitchen MyChart Message (if you have MyChart) OR . A paper copy in the mail If you have any lab test that is abnormal or we need to change your treatment, we will call you to review the results.   Testing/Procedures: None Ordered   Follow-Up: At [facility], you and your health needs are our priority.  As part of our  continuing mission to provide you with exceptional heart care, we have created designated Provider Care Teams.  These Care Teams include your primary Cardiologist (physician) and Advanced Practice Providers (APPs -  Physician Assistants and Nurse Practitioners) who all work together to provide you with the care you need, when you need it.  Your next appointment:   1 year(s)  The format for your next appointment:   In Person  Provider:   You may see Mertie Moores, MD or one of the following Advanced Practice Providers on your designated Care Team:    Richardson Dopp, PA-C  Vin Scapp[place], [place]  Daune Perch, [place]       Signed, Mertie Moores, MD  06/26/2019 10:32 AM    [place]

## 2019-06-26 ENCOUNTER — Encounter: Payer: Self-pay | Admitting: Cardiovascular Disease

## 2019-06-26 ENCOUNTER — Ambulatory Visit (INDEPENDENT_AMBULATORY_CARE_PROVIDER_SITE_OTHER): Payer: Medicaid Other | Admitting: Cardiovascular Disease

## 2019-06-26 ENCOUNTER — Other Ambulatory Visit: Payer: Self-pay

## 2019-06-26 VITALS — BP 100/82 | HR 71 | Ht 67.0 in | Wt 198.0 lb

## 2019-06-26 DIAGNOSIS — E785 Hyperlipidemia, unspecified: Secondary | ICD-10-CM | POA: Diagnosis not present

## 2019-06-26 DIAGNOSIS — I10 Essential (primary) hypertension: Secondary | ICD-10-CM | POA: Diagnosis not present

## 2019-06-26 DIAGNOSIS — I2510 Atherosclerotic heart disease of native coronary artery without angina pectoris: Secondary | ICD-10-CM

## 2019-06-26 LAB — BASIC METABOLIC PANEL
BUN/Creatinine Ratio: 16 (ref 9–23)
BUN: 20 mg/dL (ref 6–24)
CO2: 22 mmol/L (ref 20–29)
Calcium: 8.9 mg/dL (ref 8.7–10.2)
Chloride: 103 mmol/L (ref 96–106)
Creatinine, Ser: 1.27 mg/dL — ABNORMAL HIGH (ref 0.57–1.00)
GFR calc Af Amer: 55 mL/min/{1.73_m2} — ABNORMAL LOW (ref >59)
GFR calc non Af Amer: 48 mL/min/{1.73_m2} — ABNORMAL LOW (ref >59)
Glucose: 108 mg/dL — ABNORMAL HIGH (ref 65–99)
Potassium: 4 mmol/L (ref 3.5–5.2)
Sodium: 140 mmol/L (ref 134–144)

## 2019-06-26 LAB — HEPATIC FUNCTION PANEL
ALT: 25 IU/L (ref 0–32)
AST: 30 IU/L (ref 0–40)
Albumin: 4 g/dL (ref 3.8–4.9)
Alkaline Phosphatase: 122 IU/L — ABNORMAL HIGH (ref 39–117)
Bilirubin Total: 0.3 mg/dL (ref 0.0–1.2)
Bilirubin, Direct: 0.1 mg/dL (ref 0.00–0.40)
Total Protein: 6.2 g/dL (ref 6.0–8.5)

## 2019-06-26 LAB — LIPID PANEL
Chol/HDL Ratio: 4.4 ratio (ref 0.0–4.4)
Cholesterol, Total: 158 mg/dL (ref 100–199)
HDL: 36 mg/dL — ABNORMAL LOW (ref >39)
LDL Chol Calc (NIH): 87 mg/dL (ref 0–99)
Triglycerides: 208 mg/dL — ABNORMAL HIGH (ref 0–149)
VLDL Cholesterol Cal: 35 mg/dL (ref 5–40)

## 2019-06-26 NOTE — Patient Instructions (Signed)
Medication Instructions:  Your physician recommends that you continue on your current medications as directed. Please refer to the Current Medication list given to you today.  *If you need a refill on your cardiac medications before your next appointment, please call your pharmacy*  Lab Work: TODAY - cholesterol, liver panel, basic metabolic panel If you have labs (blood work) drawn today and your tests are completely normal, you will receive your results only by: . MyChart Message (if you have MyChart) OR . A paper copy in the mail If you have any lab test that is abnormal or we need to change your treatment, we will call you to review the results.   Testing/Procedures: None Ordered   Follow-Up: At [facility], you and your health needs are our priority.  As part of our continuing mission to provide you with exceptional heart care, we have created designated Provider Care Teams.  These Care Teams include your primary Cardiologist (physician) and Advanced Practice Providers (APPs -  Physician Assistants and Nurse Practitioners) who all work together to provide you with the care you need, when you need it.  Your next appointment:   1 year(s)  The format for your next appointment:   In Person  Provider:   You may see Philip Nahser, MD or one of the following Advanced Practice Providers on your designated Care Team:    Scott Weaver, PA-C  Vin Bhagat, PA-C  Janine Hammond, NP    

## 2019-07-04 ENCOUNTER — Ambulatory Visit (INDEPENDENT_AMBULATORY_CARE_PROVIDER_SITE_OTHER): Payer: Medicaid Other | Admitting: Family Medicine

## 2019-07-04 ENCOUNTER — Other Ambulatory Visit: Payer: Self-pay

## 2019-07-04 ENCOUNTER — Encounter: Payer: Self-pay | Admitting: Family Medicine

## 2019-07-04 VITALS — BP 114/85 | HR 82 | Temp 97.5°F | Resp 16 | Ht 67.0 in | Wt 197.0 lb

## 2019-07-04 DIAGNOSIS — F17200 Nicotine dependence, unspecified, uncomplicated: Secondary | ICD-10-CM

## 2019-07-04 DIAGNOSIS — N289 Disorder of kidney and ureter, unspecified: Secondary | ICD-10-CM | POA: Diagnosis not present

## 2019-07-04 DIAGNOSIS — Z8673 Personal history of transient ischemic attack (TIA), and cerebral infarction without residual deficits: Secondary | ICD-10-CM | POA: Diagnosis not present

## 2019-07-04 DIAGNOSIS — E785 Hyperlipidemia, unspecified: Secondary | ICD-10-CM

## 2019-07-04 DIAGNOSIS — I10 Essential (primary) hypertension: Secondary | ICD-10-CM | POA: Diagnosis not present

## 2019-07-04 LAB — POCT URINALYSIS DIPSTICK
Bilirubin, UA: NEGATIVE
Blood, UA: NEGATIVE
Glucose, UA: NEGATIVE
Ketones, UA: NEGATIVE
Leukocytes, UA: NEGATIVE
Nitrite, UA: NEGATIVE
Protein, UA: NEGATIVE
Spec Grav, UA: 1.015 (ref 1.010–1.025)
Urobilinogen, UA: 1 E.U./dL
pH, UA: 6 (ref 5.0–8.0)

## 2019-07-04 NOTE — Patient Instructions (Addendum)
Refrain from taking Ibuprofen or Naproxen transition to high dose Tylenol. Tylenol 500 mg every 6 hours as needed for mild to moderate pain.   - Continue medication, monitor blood pressure at home. Continue DASH diet. Reminder to go to the ER if any CP, SOB, nausea, dizziness, severe HA, changes vision/speech, left arm numbness and tingling and jaw pain.   5-6 small meals throughout the day    Managing Your Hypertension Hypertension is commonly called high blood pressure. This is when the force of your blood pressing against the walls of your arteries is too strong. Arteries are blood vessels that carry blood from your heart throughout your body. Hypertension forces the heart to work harder to pump blood, and may cause the arteries to become narrow or stiff. Having untreated or uncontrolled hypertension can cause heart attack, stroke, kidney disease, and other problems. What are blood pressure readings? A blood pressure reading consists of a higher number over a lower number. Ideally, your blood pressure should be below 120/80. The first ("top") number is called the systolic pressure. It is a measure of the pressure in your arteries as your heart beats. The second ("bottom") number is called the diastolic pressure. It is a measure of the pressure in your arteries as the heart relaxes. What does my blood pressure reading mean? Blood pressure is classified into four stages. Based on your blood pressure reading, your health care provider may use the following stages to determine what type of treatment you need, if any. Systolic pressure and diastolic pressure are measured in a unit called mm Hg. Normal  Systolic pressure: below 123456.  Diastolic pressure: below 80. Elevated  Systolic pressure: [id number].  Diastolic pressure: below 80. Hypertension stage 1  Systolic pressure: 0000000.  Diastolic pressure: [id number]. Hypertension stage 2  Systolic pressure: [id number] or above.  Diastolic pressure: 90  or above. What health risks are associated with hypertension? Managing your hypertension is an important responsibility. Uncontrolled hypertension can lead to:  A heart attack.  A stroke.  A weakened blood vessel (aneurysm).  Heart failure.  Kidney damage.  Eye damage.  Metabolic syndrome.  Memory and concentration problems. What changes can I make to manage my hypertension? Hypertension can be managed by making lifestyle changes and possibly by taking medicines. Your health care provider will help you make a plan to bring your blood pressure within a normal range. Eating and drinking   Eat a diet that is high in fiber and potassium, and low in salt (sodium), added sugar, and fat. An example eating plan is called the DASH (Dietary Approaches to Stop Hypertension) diet. To eat this way: ? Eat plenty of fresh fruits and vegetables. Try to fill half of your plate at each meal with fruits and vegetables. ? Eat whole grains, such as whole wheat pasta, brown rice, or whole grain bread. Fill about one quarter of your plate with whole grains. ? Eat low-fat diary products. ? Avoid fatty cuts of meat, processed or cured meats, and poultry with skin. Fill about one quarter of your plate with lean proteins such as fish, chicken without skin, beans, eggs, and tofu. ? Avoid premade and processed foods. These tend to be higher in sodium, added sugar, and fat.  Reduce your daily sodium intake. Most people with hypertension should eat less than 1,500 mg of sodium a day.  Limit alcohol intake to no more than 1 drink a day for nonpregnant women and 2 drinks a day for men. One drink equals  12 oz of beer, 5 oz of wine, or 1 oz of hard liquor. Lifestyle  Work with your health care provider to maintain a healthy body weight, or to lose weight. Ask what an ideal weight is for you.  Get at least 30 minutes of exercise that causes your heart to beat faster (aerobic exercise) most days of the week.  Activities may include walking, swimming, or biking.  Include exercise to strengthen your muscles (resistance exercise), such as weight lifting, as part of your weekly exercise routine. Try to do these types of exercises for 30 minutes at least 3 days a week.  Do not use any products that contain nicotine or tobacco, such as cigarettes and e-cigarettes. If you need help quitting, ask your health care provider.  Control any long-term (chronic) conditions you have, such as high cholesterol or diabetes. Monitoring  Monitor your blood pressure at home as told by your health care provider. Your personal target blood pressure may vary depending on your medical conditions, your age, and other factors.  Have your blood pressure checked regularly, as often as told by your health care provider. Working with your health care provider  Review all the medicines you take with your health care provider because there may be side effects or interactions.  Talk with your health care provider about your diet, exercise habits, and other lifestyle factors that may be contributing to hypertension.  Visit your health care provider regularly. Your health care provider can help you create and adjust your plan for managing hypertension. Will I need medicine to control my blood pressure? Your health care provider may prescribe medicine if lifestyle changes are not enough to get your blood pressure under control, and if:  Your systolic blood pressure is 130 or higher.  Your diastolic blood pressure is 80 or higher. Take medicines only as told by your health care provider. Follow the directions carefully. Blood pressure medicines must be taken as prescribed. The medicine does not work as well when you skip doses. Skipping doses also puts you at risk for problems. Contact a health care provider if:  You think you are having a reaction to medicines you have taken.  You have repeated (recurrent) headaches.  You feel  dizzy.  You have swelling in your ankles.  You have trouble with your vision. Get help right away if:  You develop a severe headache or confusion.  You have unusual weakness or numbness, or you feel faint.  You have severe pain in your chest or abdomen.  You vomit repeatedly.  You have trouble breathing. Summary  Hypertension is when the force of blood pumping through your arteries is too strong. If this condition is not controlled, it may put you at risk for serious complications.  Your personal target blood pressure may vary depending on your medical conditions, your age, and other factors. For most people, a normal blood pressure is less than 120/80.  Hypertension is managed by lifestyle changes, medicines, or both. Lifestyle changes include weight loss, eating a healthy, low-sodium diet, exercising more, and limiting alcohol. This information is not intended to replace advice given to you by your health care provider. Make sure you discuss any questions you have with your health care provider. Document Released: 03/30/2012 Document Revised: 10/28/2018 Document Reviewed: 06/03/2016 Elsevier Patient Education  2020 [employer].

## 2019-07-04 NOTE — Progress Notes (Signed)
Patient [facility]   Established Patient Office Visit  Subjective:  Patient ID: Karen Ochoa, female    DOB: [birth year]  Age: 53 y.o. MRN: [id number]  CC:  Chief Complaint  Patient presents with  . Hypertension  . Medication Refill    ibuprofen and famodine   . Hyperlipidemia    HPI Karen Ochoa, a 53 year old female with a medical history significant for hypertension, ischemia cardiomyopathy, history of chronic renal insufficiency, coronary artery disease, and history of CVA presents for follow-up of hypertension and hyperlipidemia.  Patient states that she has been doing well and is without complaint today.  She does not check blood pressures at home.  She generally follows a low-salt diet.  She does not exercise due to chronic knee pain and left-sided weakness.  Patient ambulates with a cane.  She denies headache, chest pain, shortness of breath, lower extremity edema, tachypnea, blurred vision, headache, or dizziness.  Patient is followed by cardiology for hypertension.  She has continued medications consistently without interruption. Patient is complaining of bilateral lower extremity pain.  Primarily knees.  Patient says that she typically takes ibuprofen for pain control.  She is requesting refill.  She says that she is not having pain at this time.  Pain typically occurs first thing in the morning when transitioning from sitting to standing.  Past Medical History:  Diagnosis Date  . Asthma 2017  . Fibroids   . GERD (gastroesophageal reflux disease)   . Headache    "weekly" (07/20/2017)  . High cholesterol 07/18/2017  . Hx MRSA infection   . Hypertension   . Insomnia due to medical condition   . Stroke ([place]) 07/18/2017  . TIA (transient ischemic attack)    "I've had several"    Past Surgical History:  Procedure Laterality Date  . CORONARY ARTERY BYPASS GRAFT N/A 10/07/2017   Procedure: CORONARY ARTERY BYPASS GRAFTING (CABG) ON  PUMP TIMES 5 USING LEFT INTERNAL MAMMARY ARTERY AND BILATERAL GREATER SAPHENOUS VEIN VIA ENDOHARDEST.;  Surgeon: Gaye Pollack, MD;  [facility]: [facility];  Service: Open Heart Surgery;  Laterality: N/A;  . RIGHT/LEFT HEART CATH AND CORONARY ANGIOGRAPHY N/A 10/05/2017   Procedure: RIGHT/LEFT HEART CATH AND CORONARY ANGIOGRAPHY;  Surgeon: Belva Crome, MD;  Location: [facility];  Service: Cardiovascular;  Laterality: N/A;  . TEE WITHOUT CARDIOVERSION N/A 10/07/2017   Procedure: TRANSESOPHAGEAL ECHOCARDIOGRAM (TEE);  Surgeon: Gaye Pollack, MD;  Location: [facility];  Service: Open Heart Surgery;  Laterality: N/A;  . WISDOM TOOTH EXTRACTION      Family History  Problem Relation Age of Onset  . Hypertension Mother   . Diabetes Sister   . Asthma Brother   . Diabetes Brother   . Anesthesia problems Neg Hx     Social History   Socioeconomic History  . Marital status: Single    Spouse name: Not on file  . Number of children: Not on file  . Years of education: Not on file  . Highest education level: Not on file  Occupational History  . Not on file  Tobacco Use  . Smoking status: Current Every Day Smoker    Packs/day: 0.50    Years: 10.00    Pack years: 5.00    Types: Cigarettes  . Smokeless tobacco: Never Used  . Tobacco comment: 2 to 3 per day  Substance and Sexual Activity  . Alcohol use: No    Alcohol/week: 2.0 standard drinks  Types: 2 Glasses of wine per week    Comment: 08/04/2017--states no   . Drug use: No  . Sexual activity: Yes    Birth control/protection: None  Other Topics Concern  . Not on file  Social History Narrative  . Not on file   Social Determinants of Health   Financial Resource Strain:   . Difficulty of Paying Living Expenses: Not on file  Food Insecurity:   . Worried About Charity fundraiser in the Last Year: Not on file  . Ran Out of Food in the Last Year: Not on file  Transportation Needs:   . Lack of Transportation (Medical): Not on file    . Lack of Transportation (Non-Medical): Not on file  Physical Activity:   . Days of Exercise per Week: Not on file  . Minutes of Exercise per Session: Not on file  Stress:   . Feeling of Stress : Not on file  Social Connections:   . Frequency of Communication with Friends and Family: Not on file  . Frequency of Social Gatherings with Friends and Family: Not on file  . Attends Religious Services: Not on file  . Active Member of Clubs or Organizations: Not on file  . Attends Archivist Meetings: Not on file  . Marital Status: Not on file  Intimate Partner Violence:   . Fear of Current or Ex-Partner: Not on file  . Emotionally Abused: Not on file  . Physically Abused: Not on file  . Sexually Abused: Not on file    Outpatient Medications Prior to Visit  Medication Sig Dispense Refill  . albuterol (VENTOLIN HFA) 108 (90 Base) MCG/ACT inhaler INHALE 2 PUFFS INTO THE LUNGS EVERY 6 (SIX) HOURS AS NEEDED FOR WHEEZING OR SHORTNESS OF BREATH. 18 g 11  . amLODipine (NORVASC) 10 MG tablet TAKE 1 TABLET BY MOUTH EVERY DAY 90 tablet 3  . aspirin EC 81 MG tablet Take 1 tablet (81 mg total) by mouth daily.    Marland Kitchen atorvastatin (LIPITOR) 80 MG tablet TAKE 1 TABLET BY MOUTH EVERY DAY AT 6 PM. Please keep upcoming appt in December for future refills. Thank you 90 tablet 0  . carvedilol (COREG) 25 MG tablet Take 1 tablet (25 mg total) by mouth 2 (two) times daily with a meal. 60 tablet 4  . famotidine (PEPCID) 20 MG tablet Take 1 tablet (20 mg total) by mouth 2 (two) times daily. 60 tablet 0  . losartan (COZAAR) 25 MG tablet TAKE 1 TABLET(25 MG) BY MOUTH DAILY 90 tablet 0  . traZODone (DESYREL) 100 MG tablet TAKE 1 TABLET(100 MG) BY MOUTH AT BEDTIME 90 tablet 1  . azelastine (ASTELIN) 0.1 % nasal spray Place 2 sprays into both nostrils 2 (two) times daily. Use in each nostril as directed 30 mL 5   No facility-administered medications prior to visit.    Allergies  Allergen Reactions  .  Penicillins Anaphylaxis and Other (See Comments)    Has patient had a PCN reaction causing immediate rash, facial/tongue/throat swelling, SOB or lightheadedness with hypotension: Yes Has patient had a PCN reaction causing severe rash involving mucus membranes or skin necrosis: Yes Has patient had a PCN reaction that required hospitalization Yes Has patient had a PCN reaction occurring within the last 10 years: No If all of the above answers are "NO", then may proceed with Cephalosporin use.   . Sulfonamide Derivatives Anaphylaxis  . Clonidine Derivatives Other (See Comments)    Patient passed out shortly after  ingesting this at a doctor's office on 05/08/16 (was taken in conjunction with a tablet of Labetalol.)  . Flagyl [Metronidazole] Swelling and Other (See Comments)    Skin blisters  . Labetalol Other (See Comments)    Patient passed out shortly after ingesting this at a doctor's office on 05/08/16 (was taken in conjunction with a tablet of Clonidine.)    ROS Review of Systems  Constitutional: Negative for activity change and appetite change.  HENT: Negative.   Eyes: Negative.   Respiratory: Negative.   Cardiovascular: Negative for palpitations and leg swelling.  Genitourinary: Negative.   Musculoskeletal: Positive for arthralgias.  Allergic/Immunologic: Negative.   Neurological: Positive for weakness.  Psychiatric/Behavioral: Negative.       Objective:    Physical Exam  BP 114/85 (BP Location: Left Arm, Patient Position: Sitting, Cuff Size: Normal)   Pulse 82   Temp (!) 97.5 F (36.4 C) (Oral)   Resp 16   Ht 5\' 7"  (1.702 m)   Wt 197 lb (89.4 kg)   LMP 05/19/2013   SpO2 100%   BMI 30.85 kg/m  Wt Readings from Last 3 Encounters:  07/04/19 197 lb (89.4 kg)  06/26/19 198 lb (89.8 kg)  08/11/18 210 lb (95.3 kg)     Health Maintenance Due  Topic Date Due  . PAP SMEAR-Modifier  05/21/2011  . MAMMOGRAM  07/21/2013  . COLONOSCOPY  07/21/2013    There are no  preventive care reminders to display for this patient.  Lab Results  Component Value Date   TSH 0.856 07/20/2017   Lab Results  Component Value Date   WBC 7.6 12/08/2017   HGB 11.7 12/08/2017   HCT 35.3 12/08/2017   MCV 83 12/08/2017   PLT 257 12/08/2017   Lab Results  Component Value Date   NA 140 06/26/2019   K 4.0 06/26/2019   CO2 22 06/26/2019   GLUCOSE 108 (H) 06/26/2019   BUN 20 06/26/2019   CREATININE 1.27 (H) 06/26/2019   BILITOT 0.3 06/26/2019   ALKPHOS 122 (H) 06/26/2019   AST 30 06/26/2019   ALT 25 06/26/2019   PROT 6.2 06/26/2019   ALBUMIN 4.0 06/26/2019   CALCIUM 8.9 06/26/2019   ANIONGAP 10 10/14/2017   Lab Results  Component Value Date   CHOL 158 06/26/2019   Lab Results  Component Value Date   HDL 36 (L) 06/26/2019   Lab Results  Component Value Date   LDLCALC 87 06/26/2019   Lab Results  Component Value Date   TRIG 208 (H) 06/26/2019   Lab Results  Component Value Date   CHOLHDL 4.4 06/26/2019   Lab Results  Component Value Date   HGBA1C 5.8 (H) 10/06/2017      Assessment & Plan:   Problem List Items Addressed This Visit      Cardiovascular and Mediastinum   HTN (hypertension) - Primary (Chronic)   Relevant Orders   Urinalysis Dipstick (Completed)     Genitourinary   Renal insufficiency     Other   S/P CABG x 5   Hyperlipidemia with target LDL less than 70   History of CVA (cerebrovascular accident)     1. Hypertension, unspecified type - Continue medication, monitor blood pressure at home. Continue DASH diet. Reminder to go to the ER if any CP, SOB, nausea, dizziness, severe HA, changes vision/speech, left arm numbness and tingling and jaw pain. Blood pressure Blood pressure is controlled on current medication regimen, no changes warranted on today. BP 114/85 (BP Location:  Left Arm, Patient Position: Sitting, Cuff Size: Normal)   Pulse 82   Temp (!) 97.5 F (36.4 C) (Oral)   Resp 16   Ht 5\' 7"  (1.702 m)   Wt 197  lb (89.4 kg)   LMP 05/19/2013   SpO2 100%   BMI 30.85 kg/m  - Urinalysis Dipstick  2. History of CVA (cerebrovascular accident) Continue aspirin 81 mg daily.  3. Hyperlipidemia with target LDL less than 70 Continue atorvastatin 80 mg daily.  Also recommend low-fat, low carbohydrate diet.  4. Renal insufficiency Discussed medication regimen outlined.  Refrain from nephrotoxic medications like ibuprofen.  Recommend Tylenol 500 mg every 4-6 hours for mild to moderate arthritis pain.   5. Tobacco dependence Smoking cessation instruction/counseling given:  counseled patient on the dangers of tobacco use, advised patient to stop smoking, and reviewed strategies to maximize success    Follow-up: Return in about 3 months (around 10/02/2019).    Donia Pounds  APRN, MSN, FNP-C Patient [facility] [address] [place], [place] [address] [phone]

## 2019-08-14 DIAGNOSIS — Z0271 Encounter for disability determination: Secondary | ICD-10-CM

## 2019-09-11 ENCOUNTER — Other Ambulatory Visit: Payer: Self-pay

## 2019-09-11 DIAGNOSIS — K219 Gastro-esophageal reflux disease without esophagitis: Secondary | ICD-10-CM

## 2019-09-11 MED ORDER — FAMOTIDINE 20 MG PO TABS: 20.0000 mg | ORAL_TABLET | Freq: Two times a day (BID) | ORAL | 3 refills | Status: DC

## 2019-09-20 ENCOUNTER — Other Ambulatory Visit: Payer: Self-pay | Admitting: Family Medicine

## 2019-09-20 DIAGNOSIS — G4700 Insomnia, unspecified: Secondary | ICD-10-CM

## 2019-09-26 ENCOUNTER — Other Ambulatory Visit: Payer: Self-pay | Admitting: Family Medicine

## 2019-09-26 DIAGNOSIS — J449 Chronic obstructive pulmonary disease, unspecified: Secondary | ICD-10-CM

## 2019-09-27 ENCOUNTER — Other Ambulatory Visit: Payer: Self-pay

## 2019-09-27 MED ORDER — LOSARTAN POTASSIUM 25 MG PO TABS: ORAL_TABLET | ORAL | 3 refills | Status: DC

## 2019-10-02 ENCOUNTER — Telehealth: Payer: Self-pay | Admitting: Family Medicine

## 2019-10-02 NOTE — Telephone Encounter (Signed)
Pt was called and reminded of there appointment 

## 2019-10-03 ENCOUNTER — Ambulatory Visit: Payer: Medicaid Other | Admitting: Family Medicine

## 2019-10-03 ENCOUNTER — Other Ambulatory Visit: Payer: Self-pay

## 2019-10-03 ENCOUNTER — Encounter: Payer: Self-pay | Admitting: Family Medicine

## 2019-10-03 ENCOUNTER — Ambulatory Visit (INDEPENDENT_AMBULATORY_CARE_PROVIDER_SITE_OTHER): Payer: Medicaid Other | Admitting: Family Medicine

## 2019-10-03 VITALS — BP 113/95 | HR 81 | Temp 98.2°F | Resp 16 | Ht 67.0 in | Wt 194.0 lb

## 2019-10-03 DIAGNOSIS — F17200 Nicotine dependence, unspecified, uncomplicated: Secondary | ICD-10-CM

## 2019-10-03 DIAGNOSIS — N289 Disorder of kidney and ureter, unspecified: Secondary | ICD-10-CM

## 2019-10-03 DIAGNOSIS — I10 Essential (primary) hypertension: Secondary | ICD-10-CM | POA: Diagnosis not present

## 2019-10-03 NOTE — Patient Instructions (Addendum)
COVID-19 Vaccine Information can be found at: [email or web address].[email or web address] For questions related to vaccine distribution or appointments, please email [email or web address] or call [phone].    - Continue medication, monitor blood pressure at home. Continue DASH diet. Reminder to go to the ER if any CP, SOB, nausea, dizziness, severe HA, changes vision/speech, left arm numbness and tingling and jaw pain.

## 2019-10-03 NOTE — Progress Notes (Signed)
Patient [facility]   Established Patient Office Visit  Subjective:  Patient ID: Karen Ochoa, female    DOB: 1966/07/20  Age: 54 y.o. MRN: [id number]  CC:  Chief Complaint  Patient presents with  . Hypertension     HPI Karen Ochoa, a very pleasant 54 year old female with a medical history significant for hypertension, coronary artery disease, chronic systolic CHF, cardiomyopathy, history of CVA, history of chronic kidney disease stage III and tobacco dependence presents for follow-up of hypertension.  Patient states that she has been doing well overall and is without complaint on today.  She says that she has not been following a low-fat, low-carb diet and has been sedentary.  She has been isolating in her home for COVID-19 prevention. She continues to smoke 5-6 cigarettes/day.  She states that she has attempted to quit in the past without success.  She is not interested in smoking cessation at this time. Hypertension This is a chronic problem. The problem is controlled. Pertinent negatives include no chest pain, headaches, orthopnea, palpitations, peripheral edema, PND, shortness of breath or sweats. There are no associated agents to hypertension. Risk factors for coronary artery disease include dyslipidemia and sedentary lifestyle. Compliance problems include diet.  Hypertensive end-organ damage includes kidney disease, CAD/MI and CVA. There is no history of heart failure or retinopathy.   \      BP (!) 113/95 (BP Location: Left Arm, Patient Position: Sitting, Cuff Size: Large)   Pulse 81   Temp 98.2 F (36.8 C) (Oral)   Resp 16   Ht 5\' 7"  (1.702 m)   Wt 194 lb (88 kg)   LMP 05/19/2013   SpO2 97%   BMI 30.38 kg/m  Past Medical History:  Diagnosis Date  . Asthma 2017  . Fibroids   . GERD (gastroesophageal reflux disease)   . Headache    "weekly" (07/20/2017)  . High cholesterol 07/18/2017  . Hx MRSA infection   . Hypertension    . Insomnia due to medical condition   . Stroke ([place]) 07/18/2017  . TIA (transient ischemic attack)    "I've had several"    Past Surgical History:  Procedure Laterality Date  . CORONARY ARTERY BYPASS GRAFT N/A 10/07/2017   Procedure: CORONARY ARTERY BYPASS GRAFTING (CABG) ON PUMP TIMES 5 USING LEFT INTERNAL MAMMARY ARTERY AND BILATERAL GREATER SAPHENOUS VEIN VIA ENDOHARDEST.;  Surgeon: Gaye Pollack, MD;  Location[facility];  Service: Open Heart Surgery;  Laterality: N/A;  . RIGHT/LEFT HEART CATH AND CORONARY ANGIOGRAPHY N/A 10/05/2017   Procedure: RIGHT/LEFT HEART CATH AND CORONARY ANGIOGRAPHY;  Surgeon: Belva Crome, MD;  Location: [facility];  Service: Cardiovascular;  Laterality: N/A;  . TEE WITHOUT CARDIOVERSION N/A 10/07/2017   Procedure: TRANSESOPHAGEAL ECHOCARDIOGRAM (TEE);  Surgeon: Gaye Pollack, MD;  Location: [facility];  Service: Open Heart Surgery;  Laterality: N/A;  . WISDOM TOOTH EXTRACTION      Family History  Problem Relation Age of Onset  . Hypertension Mother   . Diabetes Sister   . Asthma Brother   . Diabetes Brother   . Anesthesia problems Neg Hx     Social History   Socioeconomic History  . Marital status: Single    Spouse name: Not on file  . Number of children: Not on file  . Years of education: Not on file  . Highest education level: Not on file  Occupational History  . Not on file  Tobacco Use  . Smoking status: Current Every Day Smoker    Packs/day: 0.50    Years: 10.00    Pack years: 5.00    Types: Cigarettes  . Smokeless tobacco: Never Used  . Tobacco comment: 2 to 3 per day  Substance and Sexual Activity  . Alcohol use: No    Alcohol/week: 2.0 standard drinks    Types: 2 Glasses of wine per week    Comment: 08/04/2017--states no   . Drug use: No  . Sexual activity: Yes    Birth control/protection: None  Other Topics Concern  . Not on file  Social History Narrative  . Not on file   Social Determinants of Health   Financial  Resource Strain:   . Difficulty of Paying Living Expenses:   Food Insecurity:   . Worried About Charity fundraiser in the Last Year:   . Arboriculturist in the Last Year:   Transportation Needs:   . Film/video editor (Medical):   Marland Kitchen Lack of Transportation (Non-Medical):   Physical Activity:   . Days of Exercise per Week:   . Minutes of Exercise per Session:   Stress:   . Feeling of Stress :   Social Connections:   . Frequency of Communication with Friends and Family:   . Frequency of Social Gatherings with Friends and Family:   . Attends Religious Services:   . Active Member of Clubs or Organizations:   . Attends Archivist Meetings:   Marland Kitchen Marital Status:   Intimate Partner Violence:   . Fear of Current or Ex-Partner:   . Emotionally Abused:   Marland Kitchen Physically Abused:   . Sexually Abused:     Outpatient Medications Prior to Visit  Medication Sig Dispense Refill  . albuterol (VENTOLIN HFA) 108 (90 Base) MCG/ACT inhaler INHALE 2 PUFFS INTO THE LUNGS EVERY 6 HOURS AS NEEDED FOR WHEEZING OR SHORTNESS OF BREATH 8.5 g 3  . amLODipine (NORVASC) 10 MG tablet TAKE 1 TABLET BY MOUTH EVERY DAY 90 tablet 3  . atorvastatin (LIPITOR) 80 MG tablet TAKE 1 TABLET BY MOUTH EVERY DAY AT 6 PM. Please keep upcoming appt in December for future refills. Thank you 90 tablet 0  . carvedilol (COREG) 25 MG tablet Take 1 tablet (25 mg total) by mouth 2 (two) times daily with a meal. 60 tablet 4  . famotidine (PEPCID) 20 MG tablet Take 1 tablet (20 mg total) by mouth 2 (two) times daily. 60 tablet 3  . losartan (COZAAR) 25 MG tablet TAKE 1 TABLET(25 MG) BY MOUTH DAILY 90 tablet 3  . traZODone (DESYREL) 100 MG tablet TAKE 1 TABLET(100 MG) BY MOUTH AT BEDTIME 90 tablet 1  . aspirin EC 81 MG tablet Take 1 tablet (81 mg total) by mouth daily. (Patient not taking: Reported on 10/03/2019)     No facility-administered medications prior to visit.    Allergies  Allergen Reactions  . Penicillins  Anaphylaxis and Other (See Comments)    Has patient had a PCN reaction causing immediate rash, facial/tongue/throat swelling, SOB or lightheadedness with hypotension: Yes Has patient had a PCN reaction causing severe rash involving mucus membranes or skin necrosis: Yes Has patient had a PCN reaction that required hospitalization Yes Has patient had a PCN reaction occurring within the last 10 years: No If all of the above answers are "NO", then may proceed with Cephalosporin use.   . Sulfonamide Derivatives Anaphylaxis  . Clonidine Derivatives Other (See Comments)  Patient passed out shortly after ingesting this at a doctor's office on 05/08/16 (was taken in conjunction with a tablet of Labetalol.)  . Flagyl [Metronidazole] Swelling and Other (See Comments)    Skin blisters  . Labetalol Other (See Comments)    Patient passed out shortly after ingesting this at a doctor's office on 05/08/16 (was taken in conjunction with a tablet of Clonidine.)    ROS Review of Systems  Constitutional: Negative.   HENT: Negative.   Eyes: Negative.   Respiratory: Negative for shortness of breath.   Cardiovascular: Negative.  Negative for chest pain, palpitations, orthopnea and PND.  Gastrointestinal: Negative.   Genitourinary: Negative.   Musculoskeletal: Negative.   Neurological: Negative.  Negative for headaches.  Hematological: Negative.   Psychiatric/Behavioral: Negative.       Objective:    Physical Exam  Constitutional: She is oriented to person, place, and time. She appears well-developed and well-nourished.  HENT:  Head: Normocephalic and atraumatic.  Eyes: Pupils are equal, round, and reactive to light.  Cardiovascular: Normal rate and regular rhythm.  Pulmonary/Chest: Effort normal and breath sounds normal.  Abdominal: Soft.  Musculoskeletal:        General: Normal range of motion.     Cervical back: Normal range of motion.  Neurological: She is alert and oriented to person,  place, and time.  Skin: Skin is warm and dry.  Psychiatric: She has a normal mood and affect. Her behavior is normal. Judgment and thought content normal.    BP (!) 113/95 (BP Location: Left Arm, Patient Position: Sitting, Cuff Size: Large)   Pulse 81   Temp 98.2 F (36.8 C) (Oral)   Resp 16   Ht 5\' 7"  (1.702 m)   Wt 194 lb (88 kg)   LMP 05/19/2013   SpO2 97%   BMI 30.38 kg/m  Wt Readings from Last 3 Encounters:  10/03/19 194 lb (88 kg)  07/04/19 197 lb (89.4 kg)  06/26/19 198 lb (89.8 kg)     Health Maintenance Due  Topic Date Due  . PAP SMEAR-Modifier  05/21/2011  . MAMMOGRAM  Never done  . COLONOSCOPY  Never done    There are no preventive care reminders to display for this patient.  Lab Results  Component Value Date   TSH 0.856 07/20/2017   Lab Results  Component Value Date   WBC 7.6 12/08/2017   HGB 11.7 12/08/2017   HCT 35.3 12/08/2017   MCV 83 12/08/2017   PLT 257 12/08/2017   Lab Results  Component Value Date   NA 140 06/26/2019   K 4.0 06/26/2019   CO2 22 06/26/2019   GLUCOSE 108 (H) 06/26/2019   BUN 20 06/26/2019   CREATININE 1.27 (H) 06/26/2019   BILITOT 0.3 06/26/2019   ALKPHOS 122 (H) 06/26/2019   AST 30 06/26/2019   ALT 25 06/26/2019   PROT 6.2 06/26/2019   ALBUMIN 4.0 06/26/2019   CALCIUM 8.9 06/26/2019   ANIONGAP 10 10/14/2017   Lab Results  Component Value Date   CHOL 158 06/26/2019   Lab Results  Component Value Date   HDL 36 (L) 06/26/2019   Lab Results  Component Value Date   LDLCALC 87 06/26/2019   Lab Results  Component Value Date   TRIG 208 (H) 06/26/2019   Lab Results  Component Value Date   CHOLHDL 4.4 06/26/2019   Lab Results  Component Value Date   HGBA1C 5.8 (H) 10/06/2017      Assessment & Plan:  Problem List Items Addressed This Visit      Cardiovascular and Mediastinum   HTN (hypertension) - Primary (Chronic)   Relevant Orders   Urinalysis Dipstick      Hypertension, unspecified  type Blood pressure is at goal, no medication changes warranted at this time. Continue medication, monitor blood pressure at home. Continue DASH diet. Reminder to go to the ER if any CP, SOB, nausea, dizziness, severe HA, changes vision/speech, left arm numbness and tingling and jaw pain.    - Basic Metabolic Panel  Tobacco dependence Smoking cessation instruction/counseling given:  counseled patient on the dangers of tobacco use, advised patient to stop smoking, and reviewed strategies to maximize success . Renal insufficiency - Basic Metabolic Panel    Follow-up: Return in about 3 months (around 01/03/2020).   Donia Pounds  APRN, MSN, FNP-C Patient [facility] [address] [place], [place] [address] [phone]

## 2019-10-04 LAB — BASIC METABOLIC PANEL
BUN/Creatinine Ratio: 9 (ref 9–23)
BUN: 12 mg/dL (ref 6–24)
CO2: 22 mmol/L (ref 20–29)
Calcium: 9 mg/dL (ref 8.7–10.2)
Chloride: 100 mmol/L (ref 96–106)
Creatinine, Ser: 1.34 mg/dL — ABNORMAL HIGH (ref 0.57–1.00)
GFR calc Af Amer: 51 mL/min/{1.73_m2} — ABNORMAL LOW (ref >59)
GFR calc non Af Amer: 44 mL/min/{1.73_m2} — ABNORMAL LOW (ref >59)
Glucose: 113 mg/dL — ABNORMAL HIGH (ref 65–99)
Potassium: 4.5 mmol/L (ref 3.5–5.2)
Sodium: 137 mmol/L (ref 134–144)

## 2019-10-05 ENCOUNTER — Telehealth: Payer: Self-pay

## 2019-10-05 NOTE — Telephone Encounter (Signed)
Patient could not be reached at this time by phone. I was not able to leave a message.

## 2019-10-05 NOTE — Telephone Encounter (Signed)
-----   Message from [name], [place] sent at 10/05/2019 10:28 AM EDT ----- Regarding: lab results Please inform patient that creatinine (indicator of kidney functioning) is mildly elevated and consistent with patient's baseline. No medication changes are warranted at this time. Continue medication, monitor blood pressure at home. Continue DASH diet. Reminder to go to the ER if any CP, SOB, nausea, dizziness, severe HA, changes vision/speech, left arm numbness and tingling and jaw pain.   We will follow up in 6 months.    Donia Pounds  APRN, MSN, FNP-C Patient [facility] [address] [place], [place] [address] [phone]

## 2019-10-06 NOTE — Telephone Encounter (Signed)
Letter mailed to patient with results and follow up information.

## 2019-10-12 ENCOUNTER — Ambulatory Visit: Payer: Medicaid Other | Attending: Internal Medicine

## 2019-10-12 DIAGNOSIS — Z23 Encounter for immunization: Secondary | ICD-10-CM

## 2019-10-12 NOTE — Progress Notes (Signed)
   Covid-19 Vaccination Clinic  Name:  Karen Ochoa    MRN: [id number] DOB: 1966/05/25  10/12/2019  [name] was observed post Covid-19 immunization for 30 minutes based on pre-vaccination screening without incident. She was provided with Vaccine Information Sheet and instruction to access the V-Safe system.   [name] was instructed to call 911 with any severe reactions post vaccine: Marland Kitchen Difficulty breathing  . Swelling of face and throat  . A fast heartbeat  . A bad rash all over body  . Dizziness and weakness   Immunizations Administered    Name Date Dose VIS Date Route   Pfizer COVID-19 Vaccine 10/12/2019  4:15 PM 0.3 mL 06/30/2019 Intramuscular   Manufacturer: [place]   Lot: [id number]   [place]: [id number]

## 2019-10-24 ENCOUNTER — Telehealth: Payer: Self-pay | Admitting: Family Medicine

## 2019-10-24 ENCOUNTER — Other Ambulatory Visit: Payer: Self-pay

## 2019-10-24 ENCOUNTER — Other Ambulatory Visit: Payer: Self-pay | Admitting: Family Medicine

## 2019-10-24 DIAGNOSIS — J302 Other seasonal allergic rhinitis: Secondary | ICD-10-CM

## 2019-10-24 MED ORDER — LEVOCETIRIZINE DIHYDROCHLORIDE 5 MG PO TABS: 5.0000 mg | ORAL_TABLET | Freq: Every evening | ORAL | 11 refills | Status: DC

## 2019-10-24 NOTE — Telephone Encounter (Signed)
Called and spoke with patient, she is asking for an rx for seasonal allergies. She says she is having runny nose and watery eyes. She says she has tried Benadryl and did not help. Please advise.

## 2019-10-24 NOTE — Progress Notes (Signed)
Meds ordered this encounter  Medications  . levocetirizine (XYZAL) 5 MG tablet    Sig: Take 1 tablet (5 mg total) by mouth every evening.    Dispense:  30 tablet    Refill:  11    Order Specific Question:   Supervising Provider    Answer:   Tresa Garter [id number]    Donia Pounds  APRN, MSN, FNP-C Patient [facility] [address] [place], [place] [address] [phone]

## 2019-10-24 NOTE — Progress Notes (Signed)
Called and spoke with patient advised that levocetirizine has been sent into pharmacy and to take 1 tablet once daily in the evening. Thanks!

## 2019-11-06 ENCOUNTER — Ambulatory Visit: Payer: Medicaid Other | Attending: Internal Medicine

## 2019-11-06 DIAGNOSIS — Z23 Encounter for immunization: Secondary | ICD-10-CM

## 2019-11-06 NOTE — Progress Notes (Signed)
   Covid-19 Vaccination Clinic  Name:  KEVONA BUSH    MRN: [id number] DOB: 1965/08/29  11/06/2019  Ms. Dolson was observed post Covid-19 immunization for 15 minutes without incident. She was provided with Vaccine Information Sheet and instruction to access the V-Safe system.   Ms. Grauman was instructed to call 911 with any severe reactions post vaccine: Marland Kitchen Difficulty breathing  . Swelling of face and throat  . A fast heartbeat  . A bad rash all over body  . Dizziness and weakness   Immunizations Administered    Name Date Dose VIS Date Route   Pfizer COVID-19 Vaccine 11/06/2019  5:15 PM 0.3 mL 09/13/2018 Intramuscular   Manufacturer: [place]   Lot: [id number]   [place]: [id number]

## 2019-11-21 ENCOUNTER — Ambulatory Visit: Payer: Medicaid Other | Admitting: Family Medicine

## 2020-01-09 ENCOUNTER — Ambulatory Visit: Payer: Medicaid Other | Admitting: Family Medicine

## 2020-01-30 ENCOUNTER — Other Ambulatory Visit: Payer: Self-pay

## 2020-01-30 ENCOUNTER — Ambulatory Visit (INDEPENDENT_AMBULATORY_CARE_PROVIDER_SITE_OTHER): Payer: Medicare Other | Admitting: Family Medicine

## 2020-01-30 ENCOUNTER — Encounter: Payer: Self-pay | Admitting: Family Medicine

## 2020-01-30 VITALS — BP 104/76 | HR 74 | Temp 97.2°F | Ht 67.0 in | Wt 186.0 lb

## 2020-01-30 DIAGNOSIS — E785 Hyperlipidemia, unspecified: Secondary | ICD-10-CM

## 2020-01-30 DIAGNOSIS — G4700 Insomnia, unspecified: Secondary | ICD-10-CM

## 2020-01-30 DIAGNOSIS — F17200 Nicotine dependence, unspecified, uncomplicated: Secondary | ICD-10-CM

## 2020-01-30 DIAGNOSIS — I10 Essential (primary) hypertension: Secondary | ICD-10-CM

## 2020-01-30 MED ORDER — NICOTINE 10 MG IN INHA: 1.0000 | RESPIRATORY_TRACT | 0 refills | Status: DC | PRN

## 2020-01-30 MED ORDER — TRAZODONE HCL 150 MG PO TABS: 150.0000 mg | ORAL_TABLET | Freq: Every evening | ORAL | 2 refills | Status: DC | PRN

## 2020-01-30 NOTE — Progress Notes (Signed)
[name]   Established Patient Office Visit  Subjective:  Patient ID: Karen Ochoa, female    DOB: 07/30/1965  Age: 54 y.o. MRN: [id number]  CC:  Chief Complaint  Patient presents with  . Follow-up    3 month follow up, requesting disability parking placard and get put back on Nicotine patches  . Insomnia    Taking Trazadone and OTC tylenol PM doesnt help with sleep    HPI Karen Ochoa is a 54 year old female with a medical history significant for hypertension, chronic renal insufficiency stage III, CAD, s/p CABG x5, ischemic cardiomyopathy, hyperlipidemia, and history of CVA presents for follow-up of chronic conditions.  Patient is also complaining of ongoing insomnia that is unrelieved by trazodone and Tylenol PM.  Patient typically gets 2-3 hours of sleep per night.  She says that she has difficulty falling asleep and staying asleep.  Patient denies a history of sleep apnea. Patient also has a long history of hypertension that has been controlled on current medication regimen.  She also has a history of CVA with some left-sided weakness.  Patient utilizes a cane to assist with ambulation.  She has been taking all medications consistently.  She does not check blood pressure at home.  She also has not been following a low-fat, low carbohydrate diet or exercising consistently.  She continues to be a chronic everyday tobacco user.  She is attempted to quit in the past without success.  Patient is interested in smoking cessation, she is requesting nicotine patches.   Past Medical History:  Diagnosis Date  . Asthma 2017  . Fibroids   . GERD (gastroesophageal reflux disease)   . Headache    "weekly" (07/20/2017)  . High cholesterol 07/18/2017  . Hx MRSA infection   . Hypertension   . Insomnia due to medical condition   . Stroke ([place]) 07/18/2017  . TIA (transient ischemic attack)    "I've had several"    Past Surgical History:   Procedure Laterality Date  . CORONARY ARTERY BYPASS GRAFT N/A 10/07/2017   Procedure: CORONARY ARTERY BYPASS GRAFTING (CABG) ON PUMP TIMES 5 USING LEFT INTERNAL MAMMARY ARTERY AND BILATERAL GREATER SAPHENOUS VEIN VIA ENDOHARDEST.;  Surgeon: Gaye Pollack, MD;  Location: [facility];  Service: Open Heart Surgery;  Laterality: N/A;  . RIGHT/LEFT HEART CATH AND CORONARY ANGIOGRAPHY N/A 10/05/2017   Procedure: RIGHT/LEFT HEART CATH AND CORONARY ANGIOGRAPHY;  Surgeon: Belva Crome, MD;  Location: [facility];  Service: Cardiovascular;  Laterality: N/A;  . TEE WITHOUT CARDIOVERSION N/A 10/07/2017   Procedure: TRANSESOPHAGEAL ECHOCARDIOGRAM (TEE);  Surgeon: Gaye Pollack, MD;  Location: [facility];  Service: Open Heart Surgery;  Laterality: N/A;  . WISDOM TOOTH EXTRACTION      Family History  Problem Relation Age of Onset  . Hypertension Mother   . Diabetes Sister   . Asthma Brother   . Diabetes Brother   . Anesthesia problems Neg Hx     Social History   Socioeconomic History  . Marital status: Single    Spouse name: Not on file  . Number of children: Not on file  . Years of education: Not on file  . Highest education level: Not on file  Occupational History  . Not on file  Tobacco Use  . Smoking status: Current Every Day Smoker    Packs/day: 0.50    Years: 10.00    Pack years: 5.00    Types: Cigarettes  .  Smokeless tobacco: Never Used  . Tobacco comment: 2 to 3 per day  Vaping Use  . Vaping Use: Never used  Substance and Sexual Activity  . Alcohol use: No    Alcohol/week: 2.0 standard drinks    Types: 2 Glasses of wine per week    Comment: 08/04/2017--states no   . Drug use: No  . Sexual activity: Yes    Birth control/protection: None  Other Topics Concern  . Not on file  Social History Narrative  . Not on file   Social Determinants of Health   Financial Resource Strain:   . Difficulty of Paying Living Expenses:   Food Insecurity:   . Worried About Sales executive in the Last Year:   . Arboriculturist in the Last Year:   Transportation Needs:   . Film/video editor (Medical):   Marland Kitchen Lack of Transportation (Non-Medical):   Physical Activity:   . Days of Exercise per Week:   . Minutes of Exercise per Session:   Stress:   . Feeling of Stress :   Social Connections:   . Frequency of Communication with Friends and Family:   . Frequency of Social Gatherings with Friends and Family:   . Attends Religious Services:   . Active Member of Clubs or Organizations:   . Attends Archivist Meetings:   Marland Kitchen Marital Status:   Intimate Partner Violence:   . Fear of Current or Ex-Partner:   . Emotionally Abused:   Marland Kitchen Physically Abused:   . Sexually Abused:     Outpatient Medications Prior to Visit  Medication Sig Dispense Refill  . albuterol (VENTOLIN HFA) 108 (90 Base) MCG/ACT inhaler INHALE 2 PUFFS INTO THE LUNGS EVERY 6 HOURS AS NEEDED FOR WHEEZING OR SHORTNESS OF BREATH 8.5 g 3  . amLODipine (NORVASC) 10 MG tablet TAKE 1 TABLET BY MOUTH EVERY DAY 90 tablet 3  . atorvastatin (LIPITOR) 80 MG tablet TAKE 1 TABLET BY MOUTH EVERY DAY AT 6 PM. Please keep upcoming appt in December for future refills. Thank you 90 tablet 0  . carvedilol (COREG) 25 MG tablet Take 1 tablet (25 mg total) by mouth 2 (two) times daily with a meal. 60 tablet 4  . famotidine (PEPCID) 20 MG tablet Take 1 tablet (20 mg total) by mouth 2 (two) times daily. 60 tablet 3  . levocetirizine (XYZAL) 5 MG tablet Take 1 tablet (5 mg total) by mouth every evening. 30 tablet 11  . losartan (COZAAR) 25 MG tablet TAKE 1 TABLET(25 MG) BY MOUTH DAILY 90 tablet 3  . traZODone (DESYREL) 100 MG tablet TAKE 1 TABLET(100 MG) BY MOUTH AT BEDTIME 90 tablet 1  . aspirin EC 81 MG tablet Take 1 tablet (81 mg total) by mouth daily. (Patient not taking: Reported on 10/03/2019)     No facility-administered medications prior to visit.    Allergies  Allergen Reactions  . Penicillins Anaphylaxis and  Other (See Comments)    Has patient had a PCN reaction causing immediate rash, facial/tongue/throat swelling, SOB or lightheadedness with hypotension: Yes Has patient had a PCN reaction causing severe rash involving mucus membranes or skin necrosis: Yes Has patient had a PCN reaction that required hospitalization Yes Has patient had a PCN reaction occurring within the last 10 years: No If all of the above answers are "NO", then may proceed with Cephalosporin use.   . Sulfonamide Derivatives Anaphylaxis  . Clonidine Derivatives Other (See Comments)    Patient passed  out shortly after ingesting this at a doctor's office on 05/08/16 (was taken in conjunction with a tablet of Labetalol.)  . Flagyl [Metronidazole] Swelling and Other (See Comments)    Skin blisters  . Labetalol Other (See Comments)    Patient passed out shortly after ingesting this at a doctor's office on 05/08/16 (was taken in conjunction with a tablet of Clonidine.)    ROS Review of Systems  Constitutional: Positive for appetite change. Negative for fatigue.  HENT: Negative.   Eyes: Negative.   Respiratory: Negative.   Cardiovascular: Negative.   Gastrointestinal: Negative.   Endocrine: Negative.   Genitourinary: Negative.   Musculoskeletal: Negative.   Skin: Negative.   Allergic/Immunologic: Negative.   Neurological: Positive for numbness. Negative for dizziness, facial asymmetry and headaches.  Psychiatric/Behavioral: Positive for sleep disturbance.      Objective:    Physical Exam Eyes:     Pupils: Pupils are equal, round, and reactive to light.  Cardiovascular:     Rate and Rhythm: Normal rate and regular rhythm.  Pulmonary:     Effort: Pulmonary effort is normal.  Abdominal:     General: Bowel sounds are normal.  Skin:    General: Skin is warm.  Neurological:     General: No focal deficit present.     Mental Status: She is alert. Mental status is at baseline.     Coordination: Coordination normal.      Gait: Gait abnormal.     Deep Tendon Reflexes: Reflexes normal.  Psychiatric:        Mood and Affect: Mood normal.        Thought Content: Thought content normal.        Judgment: Judgment normal.     BP 104/76 (BP Location: Right Arm, Patient Position: Sitting, Cuff Size: Large)   Pulse 74   Temp (!) 97.2 F (36.2 C)   Ht 5\' 7"  (1.702 m)   Wt 186 lb 0.4 oz (84.4 kg)   LMP 05/19/2013   SpO2 99%   BMI 29.14 kg/m  Wt Readings from Last 3 Encounters:  01/30/20 186 lb 0.4 oz (84.4 kg)  10/03/19 194 lb (88 kg)  07/04/19 197 lb (89.4 kg)     Health Maintenance Due  Topic Date Due  . PAP SMEAR-Modifier  05/21/2011  . MAMMOGRAM  Never done  . COLONOSCOPY  Never done    There are no preventive care reminders to display for this patient.  Lab Results  Component Value Date   TSH 0.856 07/20/2017   Lab Results  Component Value Date   WBC 7.6 12/08/2017   HGB 11.7 12/08/2017   HCT 35.3 12/08/2017   MCV 83 12/08/2017   PLT 257 12/08/2017   Lab Results  Component Value Date   NA 137 10/03/2019   K 4.5 10/03/2019   CO2 22 10/03/2019   GLUCOSE 113 (H) 10/03/2019   BUN 12 10/03/2019   CREATININE 1.34 (H) 10/03/2019   BILITOT 0.3 06/26/2019   ALKPHOS 122 (H) 06/26/2019   AST 30 06/26/2019   ALT 25 06/26/2019   PROT 6.2 06/26/2019   ALBUMIN 4.0 06/26/2019   CALCIUM 9.0 10/03/2019   ANIONGAP 10 10/14/2017   Lab Results  Component Value Date   CHOL 158 06/26/2019   Lab Results  Component Value Date   HDL 36 (L) 06/26/2019   Lab Results  Component Value Date   LDLCALC 87 06/26/2019   Lab Results  Component Value Date   TRIG 208 (H)  06/26/2019   Lab Results  Component Value Date   CHOLHDL 4.4 06/26/2019   Lab Results  Component Value Date   HGBA1C 5.8 (H) 10/06/2017      Assessment & Plan:   Problem List Items Addressed This Visit      Cardiovascular and Mediastinum   HTN (hypertension) (Chronic)     Other   INSOMNIA, CHRONIC   Relevant  Medications   traZODone (DESYREL) 150 MG tablet   Hyperlipidemia with target LDL less than 70    Other Visit Diagnoses    Tobacco dependence    -  Primary   Relevant Medications   nicotine (NICOTROL) 10 MG inhaler      Meds ordered this encounter  Medications  . traZODone (DESYREL) 150 MG tablet    Sig: Take 1 tablet (150 mg total) by mouth at bedtime as needed for sleep.    Dispense:  30 tablet    Refill:  2    Order Specific Question:   Supervising Provider    Answer:   Tresa Garter [id number]  . nicotine (NICOTROL) 10 MG inhaler    Sig: Inhale 1 Cartridge (1 continuous puffing total) into the lungs as needed for smoking cessation.    Dispense:  42 each    Refill:  0    Order Specific Question:   Supervising Provider    Answer:   Angelica Chessman E [[id number]]  7. Essential hypertension - Continue medication, monitor blood pressure at home. Continue DASH diet. Reminder to go to the ER if any CP, SOB, nausea, dizziness, severe HA, changes vision/speech, left arm numbness and tingling and jaw pain.   - Basic Metabolic Panel  2. INSOMNIA, CHRONIC Will increase trazodone to 150 mg at bedtime.  - traZODone (DESYREL) 150 MG tablet; Take 1 tablet (150 mg total) by mouth at bedtime as needed for sleep.  Dispense: 30 tablet; Refill: 2  3. Tobacco dependence Smoking cessation instruction/counseling given:  counseled patient on the dangers of tobacco use, advised patient to stop smoking, and reviewed strategies to maximize success - nicotine (NICOTROL) 10 MG inhaler; Inhale 1 Cartridge (1 continuous puffing total) into the lungs as needed for smoking cessation.  Dispense: 42 each; Refill: 0  4. Hyperlipidemia with target LDL less than 70 The patient is asked to make an attempt to improve diet and exercise patterns to aid in medical management of this problem.   Follow-up: Return in about 3 months (around 05/01/2020).   Donia Pounds  APRN, MSN, FNP-C Patient [facility] [address] [place], [place] [address] [phone]

## 2020-01-30 NOTE — Patient Instructions (Addendum)
Practice good sleep hygiene.  Stick to a sleep schedule, even on weekends. Exercise is great, but not too late in the day Avoid alcoholic drinks before bed Avoid large meals and beverages late before bed Don't take naps after 3 pm. Keep power naps less than 1 hour.  Relax before bed.  Take a hot bath before bed.  Have a good sleeping environment. Get rid of anything in your bedroom that might distract you from sleep.  Adopt good sleeping posture.     Nicotine inhaler What is this medicine? NICOTINE ([place] teen) helps people stop smoking. This medicine replaces the nicotine found in cigarettes and helps to decrease withdrawal effects. It is most effective when used in combination with a stop-smoking program. This medicine may be used for other purposes; ask your health care provider or pharmacist if you have questions. COMMON BRAND NAME(S): Nicotrol What should I tell my health care provider before I take this medicine? They need to know if you have any of these conditions:  diabetes  heart disease, angina, irregular heartbeat or previous heart attack  high blood pressure  lung disease, including asthma  overactive thyroid  pheochromocytoma  seizures or history of seizures  stomach problems or ulcers  an unusual or allergic reaction to nicotine, other medicines, foods, dyes, or preservatives  pregnant or trying to get pregnant  breast-feeding How should I use this medicine? You should stop smoking completely before using the inhaler. Follow the directions carefully. Use exactly as directed. Do not use the inhaler more often than directed. Talk to your pediatrician regarding the use of this medicine in children. Special care may be needed. Overdosage: If you think you have taken too much of this medicine contact a poison control center or emergency room at once. NOTE: This medicine is only for you. Do not share this medicine with others. What if I miss a dose? If you  miss a dose, use it as soon as you can. If it is almost time for your next dose, use only that dose. Do not use double or extra doses. What may interact with this medicine?  medicines for asthma  medicines for high blood pressure  medicines for mental depression This list may not describe all possible interactions. Give your health care provider a list of all the medicines, herbs, non-prescription drugs, or dietary supplements you use. Also tell them if you smoke, drink alcohol, or use illegal drugs. Some items may interact with your medicine. What should I watch for while using this medicine? Always carry the inhaler with you. Do not smoke, chew nicotine gum, or use snuff while you are using this medicine. This reduces the chance of a nicotine overdose. If you are a diabetic and you quit smoking, the effects of insulin may be increased and you may need to reduce your insulin dose. Check with your doctor or health care professional about how you should adjust your insulin dose. What side effects may I notice from receiving this medicine? Side effects that you should report to your doctor or health care professional as soon as possible:  allergic reactions like skin rash, itching or hives, swelling of the face, lips, or tongue  breathing problems  changes in hearing  changes in vision  chest pain  cold sweats  confusion  fast, irregular heartbeat  feeling faint or lightheaded, falls  headache  increased saliva  nausea, vomiting  stomach pain  weakness Side effects that usually do not require medical attention (report  to your doctor or health care professional if they continue or are bothersome):  diarrhea  dry mouth  hiccups  irritability  nervousness or restlessness  trouble sleeping or vivid dreams This list may not describe all possible side effects. Call your doctor for medical advice about side effects. You may report side effects to FDA at  1-800-FDA-1088. Where should I keep my medicine? Keep out of the reach of children. Store at room temperature below 25 degrees C (77 degrees F). Protect from heat and light. Throw away unused medicine after the expiration date. NOTE: This sheet is a summary. It may not cover all possible information. If you have questions about this medicine, talk to your doctor, pharmacist, or health care provider.  2020 Elsevier/Gold Standard (2014-12-31 19:45:36)

## 2020-01-31 LAB — BASIC METABOLIC PANEL
BUN/Creatinine Ratio: 7 — ABNORMAL LOW (ref 9–23)
BUN: 10 mg/dL (ref 6–24)
CO2: 22 mmol/L (ref 20–29)
Calcium: 9.4 mg/dL (ref 8.7–10.2)
Chloride: 99 mmol/L (ref 96–106)
Creatinine, Ser: 1.48 mg/dL — ABNORMAL HIGH (ref 0.57–1.00)
GFR calc Af Amer: 45 mL/min/{1.73_m2} — ABNORMAL LOW (ref >59)
GFR calc non Af Amer: 39 mL/min/{1.73_m2} — ABNORMAL LOW (ref >59)
Glucose: 148 mg/dL — ABNORMAL HIGH (ref 65–99)
Potassium: 3.9 mmol/L (ref 3.5–5.2)
Sodium: 137 mmol/L (ref 134–144)

## 2020-02-06 ENCOUNTER — Telehealth: Payer: Self-pay

## 2020-02-06 NOTE — Telephone Encounter (Signed)
Called, no answer. Left a message to call back. Thanks!  

## 2020-02-06 NOTE — Telephone Encounter (Signed)
-----   Message from [name],  sent at 02/06/2020  1:46 PM EDT ----- Regarding: lab results Please inform patient that creatinine level remains slightly elevated, but is consistent with her baseline.  Control of blood pressure is very important in order to preserve kidney functioning.  Remind patient of the importance of taking medications consistently.  Also, decrease daily tobacco use.  Recommend a low-sodium diet divided over small meals throughout the day.Follow-up in office as scheduled   Donia Pounds  APRN, MSN, FNP-C Patient [facility] [address] [place], [place] [address] [phone]

## 2020-02-08 NOTE — Telephone Encounter (Signed)
Called and spoke with patient, advised that creatine remains elevated but is consistent with baseline. Asked that she continue to take medications consistently and keep next appointment. Thanks!

## 2020-02-14 ENCOUNTER — Telehealth: Payer: Self-pay | Admitting: Family Medicine

## 2020-02-14 NOTE — Telephone Encounter (Signed)
Needs nicotine inhaler approve through medicaid. States that pharm is waiting on you

## 2020-02-19 ENCOUNTER — Other Ambulatory Visit: Payer: Self-pay | Admitting: Family Medicine

## 2020-02-19 ENCOUNTER — Telehealth: Payer: Self-pay | Admitting: Family Medicine

## 2020-02-19 DIAGNOSIS — F17200 Nicotine dependence, unspecified, uncomplicated: Secondary | ICD-10-CM

## 2020-02-19 MED ORDER — NICOTINE 21 MG/24HR TD PT24: 21.0000 mg | MEDICATED_PATCH | TRANSDERMAL | 1 refills | Status: DC

## 2020-02-19 NOTE — Telephone Encounter (Signed)
[place], I submitted a PA to [place] for this on 02/15/2020 and it was denied. Do you want to change this to something else? It was for the nicotine inhaler.

## 2020-02-19 NOTE — Progress Notes (Signed)
Meds ordered this encounter  Medications  . nicotine (NICODERM CQ - DOSED IN MG/24 HOURS) 21 mg/24hr patch    Sig: Place 1 patch (21 mg total) onto the skin daily.    Dispense:  30 patch    Refill:  1    Order Specific Question:   Supervising Provider    Answer:   Tresa Garter [id number]

## 2020-02-24 ENCOUNTER — Emergency Department (HOSPITAL_COMMUNITY): Payer: Medicare Other

## 2020-02-24 ENCOUNTER — Emergency Department (HOSPITAL_COMMUNITY)
Admission: EM | Admit: 2020-02-24 | Discharge: 2020-02-25 | Disposition: A | Discharge status: Home / Self Care | Payer: Medicare Other | Attending: Emergency Medicine | Admitting: Emergency Medicine

## 2020-02-24 ENCOUNTER — Encounter (HOSPITAL_COMMUNITY): Payer: Self-pay

## 2020-02-24 DIAGNOSIS — Y939 Activity, unspecified: Secondary | ICD-10-CM | POA: Diagnosis not present

## 2020-02-24 DIAGNOSIS — M25521 Pain in right elbow: Secondary | ICD-10-CM | POA: Diagnosis not present

## 2020-02-24 DIAGNOSIS — F10129 Alcohol abuse with intoxication, unspecified: Secondary | ICD-10-CM | POA: Insufficient documentation

## 2020-02-24 DIAGNOSIS — M25561 Pain in right knee: Secondary | ICD-10-CM | POA: Insufficient documentation

## 2020-02-24 DIAGNOSIS — Z5321 Procedure and treatment not carried out due to patient leaving prior to being seen by health care provider: Secondary | ICD-10-CM | POA: Diagnosis not present

## 2020-02-24 DIAGNOSIS — Y999 Unspecified external cause status: Secondary | ICD-10-CM | POA: Insufficient documentation

## 2020-02-24 DIAGNOSIS — W19XXXA Unspecified fall, initial encounter: Secondary | ICD-10-CM | POA: Diagnosis not present

## 2020-02-24 DIAGNOSIS — Y92009 Unspecified place in unspecified non-institutional (private) residence as the place of occurrence of the external cause: Secondary | ICD-10-CM | POA: Diagnosis not present

## 2020-02-24 NOTE — ED Notes (Signed)
Please call husband when pt is ready for discharge

## 2020-02-24 NOTE — ED Triage Notes (Signed)
Pt comes via [facility] from home after sustaining a fall after ETOH use. C/o pain to R knee and R elbow

## 2020-02-25 NOTE — ED Notes (Signed)
Pt wanted IV taken out. Pt stated that she is leaving and husband is on his way to come get her.

## 2020-02-25 NOTE — ED Notes (Signed)
No response to name call x2 in lobby

## 2020-04-30 ENCOUNTER — Encounter: Payer: Self-pay | Admitting: Family Medicine

## 2020-04-30 ENCOUNTER — Other Ambulatory Visit: Payer: Self-pay

## 2020-04-30 ENCOUNTER — Ambulatory Visit (INDEPENDENT_AMBULATORY_CARE_PROVIDER_SITE_OTHER): Payer: Medicare HMO | Admitting: Family Medicine

## 2020-04-30 VITALS — BP 124/78 | HR 69 | Temp 97.2°F | Ht 67.0 in | Wt 183.0 lb

## 2020-04-30 DIAGNOSIS — J302 Other seasonal allergic rhinitis: Secondary | ICD-10-CM | POA: Diagnosis not present

## 2020-04-30 DIAGNOSIS — Z8673 Personal history of transient ischemic attack (TIA), and cerebral infarction without residual deficits: Secondary | ICD-10-CM | POA: Diagnosis not present

## 2020-04-30 DIAGNOSIS — Z7409 Other reduced mobility: Secondary | ICD-10-CM

## 2020-04-30 DIAGNOSIS — R449 Unspecified symptoms and signs involving general sensations and perceptions: Secondary | ICD-10-CM

## 2020-04-30 DIAGNOSIS — G4700 Insomnia, unspecified: Secondary | ICD-10-CM

## 2020-04-30 DIAGNOSIS — F17200 Nicotine dependence, unspecified, uncomplicated: Secondary | ICD-10-CM | POA: Diagnosis not present

## 2020-04-30 DIAGNOSIS — E785 Hyperlipidemia, unspecified: Secondary | ICD-10-CM

## 2020-04-30 DIAGNOSIS — I10 Essential (primary) hypertension: Secondary | ICD-10-CM

## 2020-04-30 DIAGNOSIS — K219 Gastro-esophageal reflux disease without esophagitis: Secondary | ICD-10-CM | POA: Diagnosis not present

## 2020-04-30 DIAGNOSIS — I25118 Atherosclerotic heart disease of native coronary artery with other forms of angina pectoris: Secondary | ICD-10-CM | POA: Diagnosis not present

## 2020-04-30 DIAGNOSIS — R531 Weakness: Secondary | ICD-10-CM

## 2020-04-30 MED ORDER — ATORVASTATIN CALCIUM 80 MG PO TABS: ORAL_TABLET | ORAL | 0 refills | Status: DC

## 2020-04-30 MED ORDER — AMLODIPINE BESYLATE 10 MG PO TABS: 10.0000 mg | ORAL_TABLET | Freq: Every day | ORAL | 3 refills | Status: DC

## 2020-04-30 MED ORDER — NICOTINE 21 MG/24HR TD PT24: 21.0000 mg | MEDICATED_PATCH | TRANSDERMAL | 1 refills | Status: DC

## 2020-04-30 MED ORDER — CARVEDILOL 25 MG PO TABS: 25.0000 mg | ORAL_TABLET | Freq: Two times a day (BID) | ORAL | 4 refills | Status: DC

## 2020-04-30 MED ORDER — LEVOCETIRIZINE DIHYDROCHLORIDE 5 MG PO TABS: 5.0000 mg | ORAL_TABLET | Freq: Every evening | ORAL | 11 refills | Status: DC

## 2020-04-30 MED ORDER — LOSARTAN POTASSIUM 25 MG PO TABS: ORAL_TABLET | ORAL | 3 refills | Status: DC

## 2020-04-30 MED ORDER — FAMOTIDINE 20 MG PO TABS: 20.0000 mg | ORAL_TABLET | Freq: Two times a day (BID) | ORAL | 3 refills | Status: DC

## 2020-04-30 MED ORDER — TRAZODONE HCL 150 MG PO TABS: 150.0000 mg | ORAL_TABLET | Freq: Every evening | ORAL | 2 refills | Status: DC | PRN

## 2020-04-30 NOTE — Patient Instructions (Signed)
Karen Ochoa is Market researcher.

## 2020-04-30 NOTE — Progress Notes (Signed)
[facility]   Established Patient Office Visit  Subjective:  Patient ID: Karen Ochoa, female    DOB: Mar 15, 1966  Age: 54 y.o. MRN: [id number]  CC:  Chief Complaint  Patient presents with  . Follow-up    HPI Karen Ochoa is a 54 year old female with a medical history significant for hypertension, chronic renal insufficiency stage III, CAD s/p CABG x5, history of ischemic cardiomyopathy, hyperlipidemia, and history of CVA presents for follow-up of chronic conditions.  Patient says that she has mostly been doing well.  [name] is an every day tobacco user.  She was previously prescribed nicotine patch and has been using consistently without success.  Patient is requesting nicotine inhaler.  She feels that she would be more consistent with inhaler.  Patient has quit in the past with success. Patient continues a low-fat, low carbohydrate diet.  She says that she is not very active due to left-sided weakness.  Patient mostly ambulates with a cane.  She has not had any recent falls.  She denies any headache, chest pain, urinary symptoms, nausea, vomiting, or diarrhea.  Patient continues to endorse some insomnia that has been mostly relieved with trazodone.  Past Medical History:  Diagnosis Date  . Asthma 2017  . Fibroids   . GERD (gastroesophageal reflux disease)   . Headache    "weekly" (07/20/2017)  . High cholesterol 07/18/2017  . Hx MRSA infection   . Hypertension   . Insomnia due to medical condition   . Stroke ([place]) 07/18/2017  . TIA (transient ischemic attack)    "I've had several"    Past Surgical History:  Procedure Laterality Date  . CORONARY ARTERY BYPASS GRAFT N/A 10/07/2017   Procedure: CORONARY ARTERY BYPASS GRAFTING (CABG) ON PUMP TIMES 5 USING LEFT INTERNAL MAMMARY ARTERY AND BILATERAL GREATER SAPHENOUS VEIN VIA ENDOHARDEST.;  Surgeon: Gaye Pollack, MD;  Location: [facility];  Service: Open Heart Surgery;  Laterality:  N/A;  . RIGHT/LEFT HEART CATH AND CORONARY ANGIOGRAPHY N/A 10/05/2017   Procedure: RIGHT/LEFT HEART CATH AND CORONARY ANGIOGRAPHY;  Surgeon: Belva Crome, MD;  Location: [facility];  Service: Cardiovascular;  Laterality: N/A;  . TEE WITHOUT CARDIOVERSION N/A 10/07/2017   Procedure: TRANSESOPHAGEAL ECHOCARDIOGRAM (TEE);  Surgeon: Gaye Pollack, MD;  Location: [facility];  Service: Open Heart Surgery;  Laterality: N/A;  . WISDOM TOOTH EXTRACTION      Family History  Problem Relation Age of Onset  . Hypertension Mother   . Diabetes Sister   . Asthma Brother   . Diabetes Brother   . Anesthesia problems Neg Hx     Social History   Socioeconomic History  . Marital status: Single    Spouse name: Not on file  . Number of children: Not on file  . Years of education: Not on file  . Highest education level: Not on file  Occupational History  . Not on file  Tobacco Use  . Smoking status: Current Every Day Smoker    Packs/day: 0.50    Years: 10.00    Pack years: 5.00    Types: Cigarettes  . Smokeless tobacco: Never Used  . Tobacco comment: 2 to 3 per day  Vaping Use  . Vaping Use: Never used  Substance and Sexual Activity  . Alcohol use: No    Alcohol/week: 2.0 standard drinks    Types: 2 Glasses of wine per week    Comment: 08/04/2017--states no   .  Drug use: No  . Sexual activity: Yes    Birth control/protection: None  Other Topics Concern  . Not on file  Social History Narrative  . Not on file   Social Determinants of Health   Financial Resource Strain:   . Difficulty of Paying Living Expenses: Not on file  Food Insecurity:   . Worried About Charity fundraiser in the Last Year: Not on file  . Ran Out of Food in the Last Year: Not on file  Transportation Needs:   . Lack of Transportation (Medical): Not on file  . Lack of Transportation (Non-Medical): Not on file  Physical Activity:   . Days of Exercise per Week: Not on file  . Minutes of Exercise per Session:  Not on file  Stress:   . Feeling of Stress : Not on file  Social Connections:   . Frequency of Communication with Friends and Family: Not on file  . Frequency of Social Gatherings with Friends and Family: Not on file  . Attends Religious Services: Not on file  . Active Member of Clubs or Organizations: Not on file  . Attends Archivist Meetings: Not on file  . Marital Status: Not on file  Intimate Partner Violence:   . Fear of Current or Ex-Partner: Not on file  . Emotionally Abused: Not on file  . Physically Abused: Not on file  . Sexually Abused: Not on file    Outpatient Medications Prior to Visit  Medication Sig Dispense Refill  . albuterol (VENTOLIN HFA) 108 (90 Base) MCG/ACT inhaler INHALE 2 PUFFS INTO THE LUNGS EVERY 6 HOURS AS NEEDED FOR WHEEZING OR SHORTNESS OF BREATH 8.5 g 3  . amLODipine (NORVASC) 10 MG tablet TAKE 1 TABLET BY MOUTH EVERY DAY 90 tablet 3  . aspirin EC 81 MG tablet Take 1 tablet (81 mg total) by mouth daily.    Marland Kitchen atorvastatin (LIPITOR) 80 MG tablet TAKE 1 TABLET BY MOUTH EVERY DAY AT 6 PM. Please keep upcoming appt in December for future refills. Thank you 90 tablet 0  . carvedilol (COREG) 25 MG tablet Take 1 tablet (25 mg total) by mouth 2 (two) times daily with a meal. 60 tablet 4  . famotidine (PEPCID) 20 MG tablet Take 1 tablet (20 mg total) by mouth 2 (two) times daily. 60 tablet 3  . levocetirizine (XYZAL) 5 MG tablet Take 1 tablet (5 mg total) by mouth every evening. 30 tablet 11  . losartan (COZAAR) 25 MG tablet TAKE 1 TABLET(25 MG) BY MOUTH DAILY 90 tablet 3  . nicotine (NICODERM CQ - DOSED IN MG/24 HOURS) 21 mg/24hr patch Place 1 patch (21 mg total) onto the skin daily. 30 patch 1  . traZODone (DESYREL) 150 MG tablet Take 1 tablet (150 mg total) by mouth at bedtime as needed for sleep. 30 tablet 2   No facility-administered medications prior to visit.    Allergies  Allergen Reactions  . Penicillins Anaphylaxis and Other (See Comments)     Has patient had a PCN reaction causing immediate rash, facial/tongue/throat swelling, SOB or lightheadedness with hypotension: Yes Has patient had a PCN reaction causing severe rash involving mucus membranes or skin necrosis: Yes Has patient had a PCN reaction that required hospitalization Yes Has patient had a PCN reaction occurring within the last 10 years: No If all of the above answers are "NO", then may proceed with Cephalosporin use.   . Sulfonamide Derivatives Anaphylaxis  . Clonidine Derivatives Other (See Comments)  Patient passed out shortly after ingesting this at a doctor's office on 05/08/16 (was taken in conjunction with a tablet of Labetalol.)  . Flagyl [Metronidazole] Swelling and Other (See Comments)    Skin blisters  . Labetalol Other (See Comments)    Patient passed out shortly after ingesting this at a doctor's office on 05/08/16 (was taken in conjunction with a tablet of Clonidine.)    ROS Review of Systems  Constitutional: Negative.   HENT: Negative.   Eyes: Negative.   Respiratory: Negative.   Gastrointestinal: Negative.   Genitourinary: Negative.   Musculoskeletal: Negative.   Skin: Negative.   Neurological: Positive for weakness (Mostly on left side). Negative for facial asymmetry.  Psychiatric/Behavioral: Positive for sleep disturbance.      Objective:    Physical Exam Constitutional:      Appearance: Normal appearance.  HENT:     Head: Normocephalic.     Mouth/Throat:     Mouth: Mucous membranes are moist.  Eyes:     Pupils: Pupils are equal, round, and reactive to light.  Cardiovascular:     Rate and Rhythm: Normal rate and regular rhythm.     Pulses: Normal pulses.  Pulmonary:     Effort: Pulmonary effort is normal.  Abdominal:     General: Bowel sounds are normal.  Skin:    General: Skin is warm.  Neurological:     Mental Status: She is alert and oriented to person, place, and time. Mental status is at baseline.     Motor:  Weakness present.     Gait: Gait abnormal.  Psychiatric:        Mood and Affect: Mood normal.        Behavior: Behavior normal.        Thought Content: Thought content normal.        Judgment: Judgment normal.     BP 124/78 (BP Location: Left Arm, Patient Position: Sitting, Cuff Size: Normal)   Pulse 69   Temp (!) 97.2 F (36.2 C) (Temporal)   Ht 5\' 7"  (1.702 m)   Wt 183 lb (83 kg)   LMP 05/19/2013   SpO2 94%   BMI 28.66 kg/m  Wt Readings from Last 3 Encounters:  04/30/20 183 lb (83 kg)  01/30/20 186 lb 0.4 oz (84.4 kg)  10/03/19 194 lb (88 kg)     Health Maintenance Due  Topic Date Due  . PAP SMEAR-Modifier  05/21/2011    There are no preventive care reminders to display for this patient.  Lab Results  Component Value Date   TSH 0.856 07/20/2017   Lab Results  Component Value Date   WBC 7.6 12/08/2017   HGB 11.7 12/08/2017   HCT 35.3 12/08/2017   MCV 83 12/08/2017   PLT 257 12/08/2017   Lab Results  Component Value Date   NA 137 01/30/2020   K 3.9 01/30/2020   CO2 22 01/30/2020   GLUCOSE 148 (H) 01/30/2020   BUN 10 01/30/2020   CREATININE 1.48 (H) 01/30/2020   BILITOT 0.3 06/26/2019   ALKPHOS 122 (H) 06/26/2019   AST 30 06/26/2019   ALT 25 06/26/2019   PROT 6.2 06/26/2019   ALBUMIN 4.0 06/26/2019   CALCIUM 9.4 01/30/2020   ANIONGAP 10 10/14/2017   Lab Results  Component Value Date   CHOL 158 06/26/2019   Lab Results  Component Value Date   HDL 36 (L) 06/26/2019   Lab Results  Component Value Date   LDLCALC 87 06/26/2019  Lab Results  Component Value Date   TRIG 208 (H) 06/26/2019   Lab Results  Component Value Date   CHOLHDL 4.4 06/26/2019   Lab Results  Component Value Date   HGBA1C 5.8 (H) 10/06/2017      Assessment & Plan:   Problem List Items Addressed This Visit      Cardiovascular and Mediastinum   Coronary artery disease of native artery of native heart with stable angina pectoris ([place])     Other   INSOMNIA,  CHRONIC   Hyperlipidemia with target LDL less than 70    Other Visit Diagnoses    Essential hypertension    -  Primary   Tobacco dependence       Gastroesophageal reflux disease without esophagitis       Seasonal allergies         INSOMNIA, CHRONIC Continue trazodone, no changes in dosage at this time.  Tobacco dependence Patient counseled at length about the dangers of smoking.  Will continue nicotine patches.  Currently nicotine inhaler does not appear to be covered by patient's insurance. - nicotine (NICODERM CQ - DOSED IN MG/24 HOURS) 21 mg/24hr patch; Place 1 patch (21 mg total) onto the skin daily.  Dispense: 30 patch; Refill: 1   Seasonal allergies Continue Xyzal at bedtime.  Avoid known allergens.  Essential hypertension BP 124/78 (BP Location: Left Arm, Patient Position: Sitting, Cuff Size: Normal)   Pulse 69   Temp (!) 97.2 F (36.2 C) (Temporal)   Ht 5\' 7"  (1.702 m)   Wt 183 lb (83 kg)   LMP 05/19/2013   SpO2 94%   BMI 28.66 kg/m Blood pressure well controlled on current medication regimen.  No changes warranted at this time.- Continue medication, monitor blood pressure at home. Continue DASH diet. Reminder to go to the ER if any CP, SOB, nausea, dizziness, severe HA, changes vision/speech, left arm numbness and tingling and jaw pain.    - losartan (COZAAR) 25 MG tablet; TAKE 1 TABLET(25 MG) BY MOUTH DAILY  Dispense: 90 tablet; Refill: 3 - Comprehensive metabolic panel  Hyperlipidemia with target LDL less than 70 Patient has history of CVA.  We will continue daily aspirin and statin therapy.  No changes in dosage is warranted at this time.  Coronary artery disease of native artery of native heart with stable angina pectoris [facility]) Follow-up with cardiology as scheduled  Impaired mobility To have left side deficit as a result of previous CVA. Patient ambulates with a can and is considered to be a high fall risk.  Patient may benefit from home heath physical  therapy. Patient warrants a safety assessment by PT.   - Ambulatory referral to [facility]  History of CVA (cerebrovascular accident)  - Ambulatory referral to [facility]  Left-sided sensory deficit present See above   Left-sided weakness - Ambulatory referral to Home Health   Follow-up: Return in about 3 months (around 07/31/2020).       Donia Pounds  APRN, MSN, FNP-C [facility] [address] [place], [place] [address] [phone]

## 2020-05-01 ENCOUNTER — Telehealth: Payer: Self-pay | Admitting: Family Medicine

## 2020-05-01 LAB — COMPREHENSIVE METABOLIC PANEL
ALT: 31 IU/L (ref 0–32)
AST: 49 IU/L — ABNORMAL HIGH (ref 0–40)
Albumin/Globulin Ratio: 1.5 (ref 1.2–2.2)
Albumin: 4.2 g/dL (ref 3.8–4.9)
Alkaline Phosphatase: 127 IU/L — ABNORMAL HIGH (ref 44–121)
BUN/Creatinine Ratio: 7 — ABNORMAL LOW (ref 9–23)
BUN: 9 mg/dL (ref 6–24)
Bilirubin Total: 0.5 mg/dL (ref 0.0–1.2)
CO2: 23 mmol/L (ref 20–29)
Calcium: 9.5 mg/dL (ref 8.7–10.2)
Chloride: 101 mmol/L (ref 96–106)
Creatinine, Ser: 1.29 mg/dL — ABNORMAL HIGH (ref 0.57–1.00)
GFR calc Af Amer: 53 mL/min/{1.73_m2} — ABNORMAL LOW (ref >59)
GFR calc non Af Amer: 46 mL/min/{1.73_m2} — ABNORMAL LOW (ref >59)
Globulin, Total: 2.8 g/dL (ref 1.5–4.5)
Glucose: 101 mg/dL — ABNORMAL HIGH (ref 65–99)
Potassium: 3.7 mmol/L (ref 3.5–5.2)
Sodium: 140 mmol/L (ref 134–144)
Total Protein: 7 g/dL (ref 6.0–8.5)

## 2020-05-02 ENCOUNTER — Other Ambulatory Visit: Payer: Self-pay | Admitting: Family Medicine

## 2020-05-02 DIAGNOSIS — I10 Essential (primary) hypertension: Secondary | ICD-10-CM

## 2020-05-02 DIAGNOSIS — J302 Other seasonal allergic rhinitis: Secondary | ICD-10-CM

## 2020-05-02 DIAGNOSIS — K219 Gastro-esophageal reflux disease without esophagitis: Secondary | ICD-10-CM

## 2020-05-02 DIAGNOSIS — G4700 Insomnia, unspecified: Secondary | ICD-10-CM

## 2020-05-02 DIAGNOSIS — E785 Hyperlipidemia, unspecified: Secondary | ICD-10-CM

## 2020-05-02 DIAGNOSIS — I25118 Atherosclerotic heart disease of native coronary artery with other forms of angina pectoris: Secondary | ICD-10-CM

## 2020-05-02 MED ORDER — FAMOTIDINE 20 MG PO TABS: 20.0000 mg | ORAL_TABLET | Freq: Two times a day (BID) | ORAL | 3 refills | Status: DC

## 2020-05-02 MED ORDER — LEVOCETIRIZINE DIHYDROCHLORIDE 5 MG PO TABS: 5.0000 mg | ORAL_TABLET | Freq: Every evening | ORAL | 2 refills | Status: DC

## 2020-05-02 MED ORDER — CARVEDILOL 25 MG PO TABS: 25.0000 mg | ORAL_TABLET | Freq: Two times a day (BID) | ORAL | 1 refills | Status: DC

## 2020-05-02 MED ORDER — ATORVASTATIN CALCIUM 80 MG PO TABS: ORAL_TABLET | ORAL | Status: DC

## 2020-05-02 MED ORDER — AMLODIPINE BESYLATE 10 MG PO TABS: 10.0000 mg | ORAL_TABLET | Freq: Every day | ORAL | 3 refills | Status: DC

## 2020-05-02 MED ORDER — TRAZODONE HCL 150 MG PO TABS: 150.0000 mg | ORAL_TABLET | Freq: Every evening | ORAL | 1 refills | Status: DC | PRN

## 2020-05-02 NOTE — Progress Notes (Signed)
Meds ordered this encounter  Medications  . atorvastatin (LIPITOR) 80 MG tablet    Sig: TAKE 1 TABLET BY MOUTH EVERY DAY AT 6 PM. Please keep upcoming appt in December for future refills. Thank you    Dispense:  90 tablet    Refill:  01    **Patient requests 90 days supply**    Order Specific Question:   Supervising Provider    Answer:   Tresa Garter [id number]  . amLODipine (NORVASC) 10 MG tablet    Sig: Take 1 tablet (10 mg total) by mouth daily.    Dispense:  90 tablet    Refill:  3    Order Specific Question:   Supervising Provider    Answer:   Tresa Garter [id number]  . traZODone (DESYREL) 150 MG tablet    Sig: Take 1 tablet (150 mg total) by mouth at bedtime as needed for sleep.    Dispense:  90 tablet    Refill:  1    Order Specific Question:   Supervising Provider    Answer:   Tresa Garter [id number]  . famotidine (PEPCID) 20 MG tablet    Sig: Take 1 tablet (20 mg total) by mouth 2 (two) times daily.    Dispense:  180 tablet    Refill:  3    Order Specific Question:   Supervising Provider    Answer:   Tresa Garter [id number]  . carvedilol (COREG) 25 MG tablet    Sig: Take 1 tablet (25 mg total) by mouth 2 (two) times daily with a meal.    Dispense:  180 tablet    Refill:  1    Order Specific Question:   Supervising Provider    Answer:   Tresa Garter [id number]  . levocetirizine (XYZAL) 5 MG tablet    Sig: Take 1 tablet (5 mg total) by mouth every evening.    Dispense:  90 tablet    Refill:  2    Order Specific Question:   Supervising Provider    Answer:   Tresa Garter [id number]

## 2020-05-02 NOTE — Telephone Encounter (Signed)
Done

## 2020-05-03 ENCOUNTER — Telehealth: Payer: Self-pay

## 2020-05-03 NOTE — Telephone Encounter (Signed)
-----   Message from [name], [place] sent at 05/03/2020  5:34 AM EDT ----- Regarding: lab results Please inform patient that creatinine, indicator of kidney functioning, has improved. No medication changes warranted at this time. - Continue medication, monitor blood pressure at home. Continue DASH diet. Reminder to go to the ER if any CP, SOB, nausea, dizziness, severe HA, changes vision/speech, left arm numbness and tingling and jaw pain.   Follow up in office as scheduled.    Donia Pounds  APRN, MSN, FNP-C [facility] [address] [place], [place] [address] [phone]

## 2020-05-03 NOTE — Telephone Encounter (Signed)
Called patient discussed labs, had no further questions

## 2020-05-10 ENCOUNTER — Encounter: Payer: Self-pay | Admitting: Physician Assistant

## 2020-06-01 DIAGNOSIS — I255 Ischemic cardiomyopathy: Secondary | ICD-10-CM | POA: Diagnosis not present

## 2020-06-01 DIAGNOSIS — N183 Chronic kidney disease, stage 3 unspecified: Secondary | ICD-10-CM | POA: Diagnosis not present

## 2020-06-01 DIAGNOSIS — I130 Hypertensive heart and chronic kidney disease with heart failure and stage 1 through stage 4 chronic kidney disease, or unspecified chronic kidney disease: Secondary | ICD-10-CM | POA: Diagnosis not present

## 2020-06-01 DIAGNOSIS — F32A Depression, unspecified: Secondary | ICD-10-CM | POA: Diagnosis not present

## 2020-06-01 DIAGNOSIS — J449 Chronic obstructive pulmonary disease, unspecified: Secondary | ICD-10-CM | POA: Diagnosis not present

## 2020-06-01 DIAGNOSIS — G4709 Other insomnia: Secondary | ICD-10-CM | POA: Diagnosis not present

## 2020-06-01 DIAGNOSIS — I5022 Chronic systolic (congestive) heart failure: Secondary | ICD-10-CM | POA: Diagnosis not present

## 2020-06-01 DIAGNOSIS — I69354 Hemiplegia and hemiparesis following cerebral infarction affecting left non-dominant side: Secondary | ICD-10-CM | POA: Diagnosis not present

## 2020-06-01 DIAGNOSIS — I2510 Atherosclerotic heart disease of native coronary artery without angina pectoris: Secondary | ICD-10-CM | POA: Diagnosis not present

## 2020-06-04 DIAGNOSIS — N183 Chronic kidney disease, stage 3 unspecified: Secondary | ICD-10-CM | POA: Diagnosis not present

## 2020-06-04 DIAGNOSIS — I130 Hypertensive heart and chronic kidney disease with heart failure and stage 1 through stage 4 chronic kidney disease, or unspecified chronic kidney disease: Secondary | ICD-10-CM | POA: Diagnosis not present

## 2020-06-04 DIAGNOSIS — I255 Ischemic cardiomyopathy: Secondary | ICD-10-CM | POA: Diagnosis not present

## 2020-06-04 DIAGNOSIS — G4709 Other insomnia: Secondary | ICD-10-CM | POA: Diagnosis not present

## 2020-06-04 DIAGNOSIS — I69354 Hemiplegia and hemiparesis following cerebral infarction affecting left non-dominant side: Secondary | ICD-10-CM | POA: Diagnosis not present

## 2020-06-04 DIAGNOSIS — I2510 Atherosclerotic heart disease of native coronary artery without angina pectoris: Secondary | ICD-10-CM | POA: Diagnosis not present

## 2020-06-04 DIAGNOSIS — F32A Depression, unspecified: Secondary | ICD-10-CM | POA: Diagnosis not present

## 2020-06-04 DIAGNOSIS — J449 Chronic obstructive pulmonary disease, unspecified: Secondary | ICD-10-CM | POA: Diagnosis not present

## 2020-06-04 DIAGNOSIS — I5022 Chronic systolic (congestive) heart failure: Secondary | ICD-10-CM | POA: Diagnosis not present

## 2020-06-06 DIAGNOSIS — I2510 Atherosclerotic heart disease of native coronary artery without angina pectoris: Secondary | ICD-10-CM | POA: Diagnosis not present

## 2020-06-06 DIAGNOSIS — I130 Hypertensive heart and chronic kidney disease with heart failure and stage 1 through stage 4 chronic kidney disease, or unspecified chronic kidney disease: Secondary | ICD-10-CM | POA: Diagnosis not present

## 2020-06-06 DIAGNOSIS — I255 Ischemic cardiomyopathy: Secondary | ICD-10-CM | POA: Diagnosis not present

## 2020-06-06 DIAGNOSIS — G4709 Other insomnia: Secondary | ICD-10-CM | POA: Diagnosis not present

## 2020-06-06 DIAGNOSIS — N183 Chronic kidney disease, stage 3 unspecified: Secondary | ICD-10-CM | POA: Diagnosis not present

## 2020-06-06 DIAGNOSIS — J449 Chronic obstructive pulmonary disease, unspecified: Secondary | ICD-10-CM | POA: Diagnosis not present

## 2020-06-06 DIAGNOSIS — F32A Depression, unspecified: Secondary | ICD-10-CM | POA: Diagnosis not present

## 2020-06-06 DIAGNOSIS — I5022 Chronic systolic (congestive) heart failure: Secondary | ICD-10-CM | POA: Diagnosis not present

## 2020-06-06 DIAGNOSIS — I69354 Hemiplegia and hemiparesis following cerebral infarction affecting left non-dominant side: Secondary | ICD-10-CM | POA: Diagnosis not present

## 2020-06-11 DIAGNOSIS — I130 Hypertensive heart and chronic kidney disease with heart failure and stage 1 through stage 4 chronic kidney disease, or unspecified chronic kidney disease: Secondary | ICD-10-CM | POA: Diagnosis not present

## 2020-06-11 DIAGNOSIS — I5022 Chronic systolic (congestive) heart failure: Secondary | ICD-10-CM | POA: Diagnosis not present

## 2020-06-11 DIAGNOSIS — I255 Ischemic cardiomyopathy: Secondary | ICD-10-CM | POA: Diagnosis not present

## 2020-06-11 DIAGNOSIS — N183 Chronic kidney disease, stage 3 unspecified: Secondary | ICD-10-CM | POA: Diagnosis not present

## 2020-06-11 DIAGNOSIS — F32A Depression, unspecified: Secondary | ICD-10-CM | POA: Diagnosis not present

## 2020-06-11 DIAGNOSIS — I69354 Hemiplegia and hemiparesis following cerebral infarction affecting left non-dominant side: Secondary | ICD-10-CM | POA: Diagnosis not present

## 2020-06-11 DIAGNOSIS — I2510 Atherosclerotic heart disease of native coronary artery without angina pectoris: Secondary | ICD-10-CM | POA: Diagnosis not present

## 2020-06-11 DIAGNOSIS — G4709 Other insomnia: Secondary | ICD-10-CM | POA: Diagnosis not present

## 2020-06-11 DIAGNOSIS — J449 Chronic obstructive pulmonary disease, unspecified: Secondary | ICD-10-CM | POA: Diagnosis not present

## 2020-06-18 DIAGNOSIS — I5022 Chronic systolic (congestive) heart failure: Secondary | ICD-10-CM | POA: Diagnosis not present

## 2020-06-18 DIAGNOSIS — I69354 Hemiplegia and hemiparesis following cerebral infarction affecting left non-dominant side: Secondary | ICD-10-CM | POA: Diagnosis not present

## 2020-06-18 DIAGNOSIS — F32A Depression, unspecified: Secondary | ICD-10-CM | POA: Diagnosis not present

## 2020-06-18 DIAGNOSIS — G4709 Other insomnia: Secondary | ICD-10-CM | POA: Diagnosis not present

## 2020-06-18 DIAGNOSIS — N183 Chronic kidney disease, stage 3 unspecified: Secondary | ICD-10-CM | POA: Diagnosis not present

## 2020-06-18 DIAGNOSIS — I2510 Atherosclerotic heart disease of native coronary artery without angina pectoris: Secondary | ICD-10-CM | POA: Diagnosis not present

## 2020-06-18 DIAGNOSIS — I255 Ischemic cardiomyopathy: Secondary | ICD-10-CM | POA: Diagnosis not present

## 2020-06-18 DIAGNOSIS — I130 Hypertensive heart and chronic kidney disease with heart failure and stage 1 through stage 4 chronic kidney disease, or unspecified chronic kidney disease: Secondary | ICD-10-CM | POA: Diagnosis not present

## 2020-06-18 DIAGNOSIS — J449 Chronic obstructive pulmonary disease, unspecified: Secondary | ICD-10-CM | POA: Diagnosis not present

## 2020-06-26 ENCOUNTER — Ambulatory Visit: Payer: Medicaid Other | Admitting: Physician Assistant

## 2020-06-28 ENCOUNTER — Ambulatory Visit: Payer: Medicare HMO | Admitting: Physician Assistant

## 2020-07-04 DIAGNOSIS — J449 Chronic obstructive pulmonary disease, unspecified: Secondary | ICD-10-CM | POA: Diagnosis not present

## 2020-07-04 DIAGNOSIS — F32A Depression, unspecified: Secondary | ICD-10-CM | POA: Diagnosis not present

## 2020-07-04 DIAGNOSIS — I5022 Chronic systolic (congestive) heart failure: Secondary | ICD-10-CM | POA: Diagnosis not present

## 2020-07-04 DIAGNOSIS — I255 Ischemic cardiomyopathy: Secondary | ICD-10-CM | POA: Diagnosis not present

## 2020-07-04 DIAGNOSIS — I2510 Atherosclerotic heart disease of native coronary artery without angina pectoris: Secondary | ICD-10-CM | POA: Diagnosis not present

## 2020-07-04 DIAGNOSIS — I69354 Hemiplegia and hemiparesis following cerebral infarction affecting left non-dominant side: Secondary | ICD-10-CM | POA: Diagnosis not present

## 2020-07-04 DIAGNOSIS — G4709 Other insomnia: Secondary | ICD-10-CM | POA: Diagnosis not present

## 2020-07-04 DIAGNOSIS — N183 Chronic kidney disease, stage 3 unspecified: Secondary | ICD-10-CM | POA: Diagnosis not present

## 2020-07-04 DIAGNOSIS — I130 Hypertensive heart and chronic kidney disease with heart failure and stage 1 through stage 4 chronic kidney disease, or unspecified chronic kidney disease: Secondary | ICD-10-CM | POA: Diagnosis not present

## 2020-07-17 ENCOUNTER — Other Ambulatory Visit: Payer: Self-pay | Admitting: Physician Assistant

## 2020-07-17 ENCOUNTER — Other Ambulatory Visit: Payer: Self-pay | Admitting: Family Medicine

## 2020-07-17 ENCOUNTER — Telehealth: Payer: Self-pay | Admitting: Family Medicine

## 2020-07-17 DIAGNOSIS — N183 Chronic kidney disease, stage 3 unspecified: Secondary | ICD-10-CM | POA: Diagnosis not present

## 2020-07-17 DIAGNOSIS — I2510 Atherosclerotic heart disease of native coronary artery without angina pectoris: Secondary | ICD-10-CM | POA: Diagnosis not present

## 2020-07-17 DIAGNOSIS — I10 Essential (primary) hypertension: Secondary | ICD-10-CM

## 2020-07-17 DIAGNOSIS — J449 Chronic obstructive pulmonary disease, unspecified: Secondary | ICD-10-CM | POA: Diagnosis not present

## 2020-07-17 DIAGNOSIS — I255 Ischemic cardiomyopathy: Secondary | ICD-10-CM | POA: Diagnosis not present

## 2020-07-17 DIAGNOSIS — F32A Depression, unspecified: Secondary | ICD-10-CM | POA: Diagnosis not present

## 2020-07-17 DIAGNOSIS — G4709 Other insomnia: Secondary | ICD-10-CM | POA: Diagnosis not present

## 2020-07-17 DIAGNOSIS — I69354 Hemiplegia and hemiparesis following cerebral infarction affecting left non-dominant side: Secondary | ICD-10-CM | POA: Diagnosis not present

## 2020-07-17 DIAGNOSIS — I5022 Chronic systolic (congestive) heart failure: Secondary | ICD-10-CM | POA: Diagnosis not present

## 2020-07-17 DIAGNOSIS — I130 Hypertensive heart and chronic kidney disease with heart failure and stage 1 through stage 4 chronic kidney disease, or unspecified chronic kidney disease: Secondary | ICD-10-CM | POA: Diagnosis not present

## 2020-07-17 MED ORDER — AMLODIPINE BESYLATE 10 MG PO TABS: 10.0000 mg | ORAL_TABLET | Freq: Every day | ORAL | 3 refills | Status: DC

## 2020-07-17 NOTE — Progress Notes (Signed)
Meds ordered this encounter  Medications  . amLODipine (NORVASC) 10 MG tablet    Sig: Take 1 tablet (10 mg total) by mouth daily.    Dispense:  90 tablet    Refill:  3    Order Specific Question:   Supervising Provider    Answer:   Quentin Angst [[id number]]    Nolon Nations  APRN, MSN, FNP-C [facility] [address] [place], [place] [address] [phone]

## 2020-07-17 NOTE — Telephone Encounter (Signed)
Done

## 2020-07-23 ENCOUNTER — Other Ambulatory Visit: Payer: Self-pay

## 2020-07-23 ENCOUNTER — Ambulatory Visit: Payer: Medicare HMO | Attending: Internal Medicine

## 2020-07-23 DIAGNOSIS — Z23 Encounter for immunization: Secondary | ICD-10-CM

## 2020-07-23 NOTE — Progress Notes (Signed)
   Covid-19 Vaccination Clinic  Name:  Karen Ochoa    MRN: [id number] DOB: April 25, 1966  07/23/2020  [name] was observed post Covid-19 immunization for 15 minutes without incident. She was provided with Vaccine Information Sheet and instruction to access the V-Safe system.   [name] was instructed to call 911 with any severe reactions post vaccine: Marland Kitchen Difficulty breathing  . Swelling of face and throat  . A fast heartbeat  . A bad rash all over body  . Dizziness and weakness   Immunizations Administered    Name Date Dose VIS Date Route   Pfizer COVID-19 Vaccine 07/23/2020  5:01 PM 0.3 mL 05/08/2020 Intramuscular   Manufacturer: [employer], [employer]   Lot: [id number]   NDC: [address]-9

## 2020-07-30 ENCOUNTER — Ambulatory Visit (INDEPENDENT_AMBULATORY_CARE_PROVIDER_SITE_OTHER): Payer: Medicare HMO | Admitting: Family Medicine

## 2020-07-30 ENCOUNTER — Encounter: Payer: Self-pay | Admitting: Family Medicine

## 2020-07-30 ENCOUNTER — Other Ambulatory Visit: Payer: Self-pay

## 2020-07-30 VITALS — BP 109/79 | HR 78 | Temp 96.9°F | Resp 16 | Ht 67.0 in | Wt 186.0 lb

## 2020-07-30 DIAGNOSIS — K581 Irritable bowel syndrome with constipation: Secondary | ICD-10-CM

## 2020-07-30 DIAGNOSIS — F17200 Nicotine dependence, unspecified, uncomplicated: Secondary | ICD-10-CM | POA: Diagnosis not present

## 2020-07-30 DIAGNOSIS — I10 Essential (primary) hypertension: Secondary | ICD-10-CM

## 2020-07-30 DIAGNOSIS — E785 Hyperlipidemia, unspecified: Secondary | ICD-10-CM

## 2020-07-30 MED ORDER — LINACLOTIDE 72 MCG PO CAPS: 72.0000 ug | ORAL_CAPSULE | Freq: Every day | ORAL | 1 refills | Status: DC

## 2020-07-30 NOTE — Progress Notes (Signed)
[facility]    Established Patient Office Visit  Subjective:  Patient ID: Karen Ochoa, female    DOB: 1965/08/23  Age: 56 y.o. MRN: [id number]  CC:  Chief Complaint  Patient presents with  . Medical Management of Chronic Issues    3 month follow up.    HPI Karen Ochoa presents for  Karen Ochoa is a 56 year old female with a medical history significant for hypertension, chronic kidney disease stage III, CAD s/p CABG x5, and history of CVA presents for follow-up of chronic conditions.  Hypertension This is a chronic problem. The problem is controlled. Pertinent negatives include no anxiety, blurred vision, chest pain, headaches, malaise/fatigue, neck pain, orthopnea, palpitations, peripheral edema, PND, shortness of breath or sweats. Risk factors for coronary artery disease include dyslipidemia and smoking/tobacco exposure. The current treatment provides significant improvement. There are no compliance problems.  Hypertensive end-organ damage includes CVA. Identifiable causes of hypertension include chronic renal disease.  Constipation This is a chronic problem. The current episode started more than 1 year ago. The problem is unchanged. Her stool frequency is 1 time per week or less. The stool is described as firm. The patient is not on a high fiber diet. She does not exercise regularly. There has not been adequate water intake. Associated symptoms include flatus. Pertinent negatives include no abdominal pain, anorexia, back pain, bloating, diarrhea, difficulty urinating, fecal incontinence, fever, hemorrhoids, nausea or rectal pain. She has tried laxatives for the symptoms. The treatment provided mild relief.  Nicotine Dependence Presents for follow-up visit. Her urge triggers include company of smokers. The symptoms have been stable. Her first smoke is from 6 to 8 AM. She smokes < 1/2 a pack of cigarettes per day. Compliance with prior  treatments has been poor.  Hyperlipidemia This is a chronic problem. The current episode started more than 1 year ago. The problem is controlled. Exacerbating diseases include chronic renal disease. Factors aggravating her hyperlipidemia include fatty foods and smoking. Pertinent negatives include no chest pain or shortness of breath. There are no compliance problems.     Past Medical History:  Diagnosis Date  . Asthma 2017  . Fibroids   . GERD (gastroesophageal reflux disease)   . Headache    "weekly" (07/20/2017)  . High cholesterol 07/18/2017  . Hx MRSA infection   . Hypertension   . Insomnia due to medical condition   . Stroke ([facility]) 07/18/2017  . TIA (transient ischemic attack)    "I've had several"    Past Surgical History:  Procedure Laterality Date  . CORONARY ARTERY BYPASS GRAFT N/A 10/07/2017   Procedure: CORONARY ARTERY BYPASS GRAFTING (CABG) ON PUMP TIMES 5 USING LEFT INTERNAL MAMMARY ARTERY AND BILATERAL GREATER SAPHENOUS VEIN VIA ENDOHARDEST.;  Surgeon: Alleen Borne, MD;  Location: [facility] OR;  Service: Open Heart Surgery;  Laterality: N/A;  . RIGHT/LEFT HEART CATH AND CORONARY ANGIOGRAPHY N/A 10/05/2017   Procedure: RIGHT/LEFT HEART CATH AND CORONARY ANGIOGRAPHY;  Surgeon: Lyn Records, MD;  Location: [facility];  Service: Cardiovascular;  Laterality: N/A;  . TEE WITHOUT CARDIOVERSION N/A 10/07/2017   Procedure: TRANSESOPHAGEAL ECHOCARDIOGRAM (TEE);  Surgeon: Alleen Borne, MD;  Location: [facility] OR;  Service: Open Heart Surgery;  Laterality: N/A;  . WISDOM TOOTH EXTRACTION      Family History  Problem Relation Age of Onset  . Hypertension Mother   . Diabetes Sister   . Asthma Brother   . Diabetes  Brother   . Anesthesia problems Neg Hx     Social History   Socioeconomic History  . Marital status: Single    Spouse name: Not on file  . Number of children: Not on file  . Years of education: Not on file  . Highest education level: Not on file  Occupational  History  . Not on file  Tobacco Use  . Smoking status: Current Every Day Smoker    Packs/day: 0.50    Years: 10.00    Pack years: 5.00    Types: Cigarettes  . Smokeless tobacco: Never Used  . Tobacco comment: 2 to 3 per day  Vaping Use  . Vaping Use: Never used  Substance and Sexual Activity  . Alcohol use: No    Alcohol/week: 2.0 standard drinks    Types: 2 Glasses of wine per week    Comment: 08/04/2017--states no   . Drug use: No  . Sexual activity: Yes    Birth control/protection: None  Other Topics Concern  . Not on file  Social History Narrative  . Not on file   Social Determinants of Health   Financial Resource Strain: Not on file  Food Insecurity: Not on file  Transportation Needs: Not on file  Physical Activity: Not on file  Stress: Not on file  Social Connections: Not on file  Intimate Partner Violence: Not on file    Outpatient Medications Prior to Visit  Medication Sig Dispense Refill  . albuterol (VENTOLIN HFA) 108 (90 Base) MCG/ACT inhaler INHALE 2 PUFFS INTO THE LUNGS EVERY 6 HOURS AS NEEDED FOR WHEEZING OR SHORTNESS OF BREATH 8.5 g 3  . amLODipine (NORVASC) 10 MG tablet Take 1 tablet (10 mg total) by mouth daily. 90 tablet 3  . aspirin EC 81 MG tablet Take 1 tablet (81 mg total) by mouth daily.    Marland Kitchen atorvastatin (LIPITOR) 80 MG tablet TAKE 1 TABLET BY MOUTH EVERY DAY AT 6 PM. Please keep upcoming appt in December for future refills. Thank you 90 tablet 01  . carvedilol (COREG) 25 MG tablet Take 1 tablet (25 mg total) by mouth 2 (two) times daily with a meal. 180 tablet 1  . famotidine (PEPCID) 20 MG tablet Take 1 tablet (20 mg total) by mouth 2 (two) times daily. 180 tablet 3  . levocetirizine (XYZAL) 5 MG tablet Take 1 tablet (5 mg total) by mouth every evening. 90 tablet 2  . losartan (COZAAR) 25 MG tablet TAKE 1 TABLET(25 MG) BY MOUTH DAILY 90 tablet 3  . nicotine (NICODERM CQ - DOSED IN MG/24 HOURS) 21 mg/24hr patch Place 1 patch (21 mg total) onto the  skin daily. 30 patch 1  . traZODone (DESYREL) 150 MG tablet Take 1 tablet (150 mg total) by mouth at bedtime as needed for sleep. 90 tablet 1   No facility-administered medications prior to visit.    Allergies  Allergen Reactions  . Penicillins Anaphylaxis and Other (See Comments)    Has patient had a PCN reaction causing immediate rash, facial/tongue/throat swelling, SOB or lightheadedness with hypotension: Yes Has patient had a PCN reaction causing severe rash involving mucus membranes or skin necrosis: Yes Has patient had a PCN reaction that required hospitalization Yes Has patient had a PCN reaction occurring within the last 10 years: No If all of the above answers are "NO", then may proceed with Cephalosporin use.   . Sulfonamide Derivatives Anaphylaxis  . Clonidine Derivatives Other (See Comments)    Patient passed out shortly  after ingesting this at a doctor's office on 05/08/16 (was taken in conjunction with a tablet of Labetalol.)  . Flagyl [Metronidazole] Swelling and Other (See Comments)    Skin blisters  . Labetalol Other (See Comments)    Patient passed out shortly after ingesting this at a doctor's office on 05/08/16 (was taken in conjunction with a tablet of Clonidine.)    ROS Review of Systems  Constitutional: Negative for activity change, appetite change, fever and malaise/fatigue.  HENT: Negative.   Eyes: Negative.  Negative for blurred vision.  Respiratory: Negative.  Negative for shortness of breath.   Cardiovascular: Negative.  Negative for chest pain, palpitations, orthopnea and PND.  Gastrointestinal: Positive for constipation and flatus. Negative for abdominal pain, anorexia, bloating, diarrhea, hemorrhoids, nausea and rectal pain.  Endocrine: Negative for polydipsia, polyphagia and polyuria.  Genitourinary: Negative.  Negative for difficulty urinating.  Musculoskeletal: Negative.  Negative for back pain and neck pain.  Skin: Negative.   Neurological:  Positive for weakness. Negative for headaches.  Psychiatric/Behavioral: Negative.       Objective:    Physical Exam Constitutional:      Appearance: Normal appearance.  Cardiovascular:     Rate and Rhythm: Normal rate and regular rhythm.     Pulses: Normal pulses.  Musculoskeletal:        General: Normal range of motion.  Skin:    General: Skin is warm.  Neurological:     General: No focal deficit present.     Mental Status: She is alert. Mental status is at baseline.     BP 109/79   Pulse 78   Temp (!) 96.9 F (36.1 C)   Resp 16   Ht 5\' 7"  (1.702 m)   Wt 186 lb (84.4 kg)   LMP 05/19/2013   SpO2 98%   BMI 29.13 kg/m  Wt Readings from Last 3 Encounters:  07/30/20 186 lb (84.4 kg)  04/30/20 183 lb (83 kg)  01/30/20 186 lb 0.4 oz (84.4 kg)     Health Maintenance Due  Topic Date Due  . PAP SMEAR-Modifier  05/21/2011    There are no preventive care reminders to display for this patient.  Lab Results  Component Value Date   TSH 0.856 07/20/2017   Lab Results  Component Value Date   WBC 7.6 12/08/2017   HGB 11.7 12/08/2017   HCT 35.3 12/08/2017   MCV 83 12/08/2017   PLT 257 12/08/2017   Lab Results  Component Value Date   NA 140 04/30/2020   K 3.7 04/30/2020   CO2 23 04/30/2020   GLUCOSE 101 (H) 04/30/2020   BUN 9 04/30/2020   CREATININE 1.29 (H) 04/30/2020   BILITOT 0.5 04/30/2020   ALKPHOS 127 (H) 04/30/2020   AST 49 (H) 04/30/2020   ALT 31 04/30/2020   PROT 7.0 04/30/2020   ALBUMIN 4.2 04/30/2020   CALCIUM 9.5 04/30/2020   ANIONGAP 10 10/14/2017   Lab Results  Component Value Date   CHOL 158 06/26/2019   Lab Results  Component Value Date   HDL 36 (L) 06/26/2019   Lab Results  Component Value Date   LDLCALC 87 06/26/2019   Lab Results  Component Value Date   TRIG 208 (H) 06/26/2019   Lab Results  Component Value Date   CHOLHDL 4.4 06/26/2019   Lab Results  Component Value Date   HGBA1C 5.8 (H) 10/06/2017      Assessment  & Plan:   Problem List Items Addressed This Visit  Other   Hyperlipidemia with target LDL less than 70   Relevant Orders   Lipid Panel (Completed)    Other Visit Diagnoses    Irritable bowel syndrome with constipation    -  Primary   Relevant Medications   linaclotide (LINZESS) 72 MCG capsule   Tobacco dependence       Essential hypertension       Relevant Orders   Comprehensive metabolic panel (Completed)    1. Irritable bowel syndrome with constipation Will start a trial of linaclotide daily. Patient also advised to increase daily fiber intake. Also, drink 32 ounces of fluids per day.  - linaclotide (LINZESS) 72 MCG capsule; Take 1 capsule (72 mcg total) by mouth daily before breakfast.  Dispense: 90 capsule; Refill: 1  2. Tobacco dependence Smoking cessation instruction/counseling given:  counseled patient on the dangers of tobacco use, advised patient to stop smoking, and reviewed strategies to maximize success   3. Essential hypertension BP 109/79   Pulse 78   Temp (!) 96.9 F (36.1 C)   Resp 16   Ht 5\' 7"  (1.702 m)   Wt 186 lb (84.4 kg)   LMP 05/19/2013   SpO2 98%   BMI 29.13 kg/m  - Continue medication, monitor blood pressure at home. Continue DASH diet. Reminder to go to the ER if any CP, SOB, nausea, dizziness, severe HA, changes vision/speech, left arm numbness and tingling and jaw pain.    - Comprehensive metabolic panel  4. Hyperlipidemia with target LDL less than 70  - Lipid Panel    Follow-up: Return in about 3 months (around 10/28/2020) for hypertension.    Donia Pounds  APRN, MSN, FNP-C [facility] [address] [place], [place] [address] [phone]

## 2020-07-31 LAB — LIPID PANEL
Chol/HDL Ratio: 3.3 ratio (ref 0.0–4.4)
Cholesterol, Total: 129 mg/dL (ref 100–199)
HDL: 39 mg/dL — ABNORMAL LOW (ref >39)
LDL Chol Calc (NIH): 66 mg/dL (ref 0–99)
Triglycerides: 137 mg/dL (ref 0–149)
VLDL Cholesterol Cal: 24 mg/dL (ref 5–40)

## 2020-07-31 LAB — COMPREHENSIVE METABOLIC PANEL
ALT: 28 IU/L (ref 0–32)
AST: 47 IU/L — ABNORMAL HIGH (ref 0–40)
Albumin/Globulin Ratio: 1.6 (ref 1.2–2.2)
Albumin: 4.2 g/dL (ref 3.8–4.9)
Alkaline Phosphatase: 117 IU/L (ref 44–121)
BUN/Creatinine Ratio: 8 — ABNORMAL LOW (ref 9–23)
BUN: 11 mg/dL (ref 6–24)
Bilirubin Total: 0.6 mg/dL (ref 0.0–1.2)
CO2: 22 mmol/L (ref 20–29)
Calcium: 9 mg/dL (ref 8.7–10.2)
Chloride: 99 mmol/L (ref 96–106)
Creatinine, Ser: 1.31 mg/dL — ABNORMAL HIGH (ref 0.57–1.00)
GFR calc Af Amer: 52 mL/min/{1.73_m2} — ABNORMAL LOW (ref >59)
GFR calc non Af Amer: 45 mL/min/{1.73_m2} — ABNORMAL LOW (ref >59)
Globulin, Total: 2.7 g/dL (ref 1.5–4.5)
Glucose: 108 mg/dL — ABNORMAL HIGH (ref 65–99)
Potassium: 4 mmol/L (ref 3.5–5.2)
Sodium: 137 mmol/L (ref 134–144)
Total Protein: 6.9 g/dL (ref 6.0–8.5)

## 2020-08-07 ENCOUNTER — Other Ambulatory Visit: Payer: Self-pay | Admitting: Family Medicine

## 2020-08-07 DIAGNOSIS — E785 Hyperlipidemia, unspecified: Secondary | ICD-10-CM

## 2020-08-14 ENCOUNTER — Telehealth: Payer: Self-pay | Admitting: Family Medicine

## 2020-08-14 ENCOUNTER — Ambulatory Visit (INDEPENDENT_AMBULATORY_CARE_PROVIDER_SITE_OTHER): Payer: Medicare HMO | Admitting: Physician Assistant

## 2020-08-14 ENCOUNTER — Other Ambulatory Visit: Payer: Self-pay

## 2020-08-14 ENCOUNTER — Encounter: Payer: Self-pay | Admitting: Physician Assistant

## 2020-08-14 VITALS — BP 124/82 | HR 74 | Ht 67.0 in | Wt 184.8 lb

## 2020-08-14 DIAGNOSIS — N183 Chronic kidney disease, stage 3 unspecified: Secondary | ICD-10-CM | POA: Diagnosis not present

## 2020-08-14 DIAGNOSIS — I10 Essential (primary) hypertension: Secondary | ICD-10-CM

## 2020-08-14 DIAGNOSIS — I255 Ischemic cardiomyopathy: Secondary | ICD-10-CM | POA: Diagnosis not present

## 2020-08-14 DIAGNOSIS — I25700 Atherosclerosis of coronary artery bypass graft(s), unspecified, with unstable angina pectoris: Secondary | ICD-10-CM

## 2020-08-14 DIAGNOSIS — E785 Hyperlipidemia, unspecified: Secondary | ICD-10-CM | POA: Diagnosis not present

## 2020-08-14 MED ORDER — ASPIRIN EC 81 MG PO TBEC
81.0000 mg | DELAYED_RELEASE_TABLET | Freq: Every day | ORAL | 3 refills | Status: DC
Start: 2020-08-14 — End: 2022-07-15

## 2020-08-14 NOTE — Telephone Encounter (Signed)
-----   Message from [name], [place] sent at 08/14/2020 12:16 PM EST ----- Regarding: lab results Please inform patient that creatinine, indicator of kidney function remains slightly elevated but is consistent with baseline. We will continue tight control of blood pressure. No medication changes warranted at this time.  - Continue medication, monitor blood pressure at home. Continue DASH diet. Reminder to go to the ER if any CP, SOB, nausea, dizziness, severe HA, changes vision/speech, left arm numbness and tingling and jaw pain.  Donia Pounds  APRN, MSN, FNP-C [facility] [address] [place], [place] [address] [phone]

## 2020-08-14 NOTE — Progress Notes (Signed)
Cardiology Office Note:    Date:  08/14/2020   ID:  Karen Ochoa, DOB Jan 22, 1966, MRN [id number]  PCP:  [name], FNP  [facility] Cardiologist:  Mertie Moores, MD  [facility] Electrophysiologist:  None   Chief Complaint: yearly follow up   History of Present Illness:    Karen Ochoa is a 55 y.o. female with a hx of CAD s/p CABG 09/2017 x5, ICM with improved LVEF, CVA with residual L sided weakness, CKD III, tobacco abuse, IBS  HTN and HLD seen for follow up.   2D echo in 07/2017 LVEF 30-35% with global hypokinesis. Cardiac cath3/19/19 showed three-vessel CAD EF 55% with inferior hypokinesis. Subsequent coronary artery bypass grafting x 5.   Left internal mammary graft to the LAD  SVG to diagonal  SVG to OM1  SVG to OM2  SVG to RCA  Here today for follow up.  Patient was doing physical therapy at home for her stroke without any difficulty.  She was just released from PT few weeks ago.  She has exercise bike.  Use cane occasionally.  Denies chest pain, shortness of breath, palpitation, dizziness lower extremity edema or melena.  She is not taking her aspirin.  She reports breathing difficulty when she has late dinner. Hx of GERD and IBS. Currently smokes 3 cigarette/Karen Ochoa.   Past Medical History:  Diagnosis Date  . Asthma 2017  . CAD in native artery    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  . Fibroids   . GERD (gastroesophageal reflux disease)   . Headache    "weekly" (07/20/2017)  . HLD (hyperlipidemia) 07/18/2017  . Hx MRSA infection   . Hypertension   . Insomnia due to medical condition   . Ischemic cardiomyopathy   . Stroke ([place]) 07/18/2017  . TIA (transient ischemic attack)    "I've had several"    Past Surgical History:  Procedure Laterality Date  . CORONARY ARTERY BYPASS GRAFT N/A 10/07/2017   Procedure: CORONARY ARTERY BYPASS GRAFTING (CABG) ON PUMP TIMES 5 USING LEFT INTERNAL MAMMARY ARTERY AND BILATERAL GREATER SAPHENOUS VEIN VIA ENDOHARDEST.;  Surgeon:  Gaye Pollack, MD;  Location: [facility] OR;  Service: Open Heart Surgery;  Laterality: N/A;  . RIGHT/LEFT HEART CATH AND CORONARY ANGIOGRAPHY N/A 10/05/2017   Procedure: RIGHT/LEFT HEART CATH AND CORONARY ANGIOGRAPHY;  Surgeon: Belva Crome, MD;  Location: [facility] CV LAB;  Service: Cardiovascular;  Laterality: N/A;  . TEE WITHOUT CARDIOVERSION N/A 10/07/2017   Procedure: TRANSESOPHAGEAL ECHOCARDIOGRAM (TEE);  Surgeon: Gaye Pollack, MD;  Location: [facility];  Service: Open Heart Surgery;  Laterality: N/A;  . WISDOM TOOTH EXTRACTION      Current Medications: Current Meds  Medication Sig  . albuterol (VENTOLIN HFA) 108 (90 Base) MCG/ACT inhaler INHALE 2 PUFFS INTO THE LUNGS EVERY 6 HOURS AS NEEDED FOR WHEEZING OR SHORTNESS OF BREATH  . amLODipine (NORVASC) 10 MG tablet Take 1 tablet (10 mg total) by mouth daily.  Marland Kitchen aspirin EC 81 MG tablet Take 1 tablet (81 mg total) by mouth daily. Swallow whole.  Marland Kitchen atorvastatin (LIPITOR) 80 MG tablet TAKE 1 TABLET BY MOUTH EVERY Gavrielle Streck AT 6 PM. PLEASE KEEP UPCOMING APPT IN DECEMBER FOR FUTURE REFILLS  . carvedilol (COREG) 25 MG tablet Take 1 tablet (25 mg total) by mouth 2 (two) times daily with a meal.  . famotidine (PEPCID) 20 MG tablet Take 1 tablet (20 mg total) by mouth 2 (two) times daily.  Marland Kitchen levocetirizine (XYZAL) 5 MG tablet  Take 1 tablet (5 mg total) by mouth every evening.  . linaclotide (LINZESS) 72 MCG capsule Take 1 capsule (72 mcg total) by mouth daily before breakfast.  . losartan (COZAAR) 25 MG tablet TAKE 1 TABLET(25 MG) BY MOUTH DAILY  . traZODone (DESYREL) 150 MG tablet Take 1 tablet (150 mg total) by mouth at bedtime as needed for sleep.     Allergies:   Penicillins, Sulfonamide derivatives, Clonidine derivatives, Flagyl [metronidazole], and Labetalol   Social History   Socioeconomic History  . Marital status: Single    Spouse name: Not on file  . Number of children: Not on file  . Years of education: Not on file  . Highest education  level: Not on file  Occupational History  . Not on file  Tobacco Use  . Smoking status: Current Every [place] Smoker    Packs/Renny Gunnarson: 0.50    Years: 10.00    Pack years: 5.00    Types: Cigarettes  . Smokeless tobacco: Never Used  . Tobacco comment: 2 to 3 per Karen Ochoa  Vaping Use  . Vaping Use: Never used  Substance and Sexual Activity  . Alcohol use: No    Alcohol/week: 2.0 standard drinks    Types: 2 Glasses of wine per week    Comment: 08/04/2017--states no   . Drug use: No  . Sexual activity: Yes    Birth control/protection: None  Other Topics Concern  . Not on file  Social History Narrative  . Not on file   Social Determinants of Health   Financial Resource Strain: Not on file  Food Insecurity: Not on file  Transportation Needs: Not on file  Physical Activity: Not on file  Stress: Not on file  Social Connections: Not on file     Family History: The patient's family history includes Asthma in her brother; Diabetes in her brother and sister; Hypertension in her mother. There is no history of Anesthesia problems.    ROS:   Please see the history of present illness.    All other systems reviewed and are negative.   EKGs/Labs/Other Studies Reviewed:    The following studies were reviewed today:  Echo 07/2017 Study Conclusions   - Left ventricle: The cavity size was normal. There was severe  concentric hypertrophy. Systolic function was moderately to  severely reduced. The estimated ejection fraction was in the  range of 30% to 35%. Diffuse hypokinesis. Doppler parameters are  consistent with abnormal left ventricular relaxation (grade 1  diastolic dysfunction). Doppler parameters are consistent with  elevated ventricular end-diastolic filling pressure.  - Aortic valve: There was trivial regurgitation.  - Aortic root: The aortic root was normal in size.  - Mitral valve: There was mild regurgitation directed posteriorly.  - Left atrium: The atrium was mildly  dilated.  - Right ventricle: The cavity size was normal. Wall thickness was  normal. Systolic function was normal.  - Right atrium: The atrium was normal in size.  - Tricuspid valve: There was mild regurgitation.  - Pulmonic valve: There was trivial regurgitation.  - Pulmonary arteries: Systolic pressure was within the normal  range.  - Inferior vena cava: The vessel was normal in size.  - Pericardium, extracardiac: There was no pericardial effusion.   Impressions:   - Severe concentric LVH with diffuse hypokinesis and LVEF 30-35%. A  cardiac MRI is recommended to differentiate hypertensive vs  infiltrative cardiomyopathy.   RIGHT/LEFT HEART CATH AND CORONARY ANGIOGRAPHY  3/201*9    Conclusion   Multivessel coronary  artery disease, with Syntax I score of 29.  Normal left main  Segmental calcified 85% mid LAD stenosis forming a [place] 011 bifurcation lesion with a large first diagonal.  LAD wraps around left ventricular apex and supplies collaterals to a small diffusely diseased PDA.  The circumflex is a codominant system with the right coronary and has severe disease in the first (90%) and second (70%) obtuse marginal branches beyond moderate to severe tortuosity.  The mid circumflex between the first and second obtuse marginals contains diffuse disease with up to 80% stenosis.  Right coronary is totally occluded proximally with distal continuation supplied by collaterals from the circumflex.  Inferior basal wall severe hypokinesis.  Overall normal EF of 55%.  Normal LVEDP.  Normal pulmonary artery pressures.  Pulmonary capillary wedge pressure 24.  LVEDP is normal.  RECOMMENDATIONS:   Relatively young female with significant comorbidity and multivessel coronary disease with intermediate risk for PCI based on Syntax I score.  Further uptitrate statin intensity  Evaluation for multivessel CABG.    EKG:  EKG is ordered today.  The ekg ordered today demonstrates  SR  Recent Labs: 07/30/2020: ALT 28; BUN 11; Creatinine, Ser 1.31; Potassium 4.0; Sodium 137  Recent Lipid Panel    Component Value Date/Time   CHOL 129 07/30/2020 1350   TRIG 137 07/30/2020 1350   HDL 39 (L) 07/30/2020 1350   CHOLHDL 3.3 07/30/2020 1350   CHOLHDL 4.7 07/19/2017 0556   VLDL 42 (H) 07/19/2017 0556   LDLCALC 66 07/30/2020 1350   Physical Exam:    VS:  BP 124/82   Pulse 74   Ht 5\' 7"  (1.702 m)   Wt 184 lb 12.8 oz (83.8 kg)   LMP 05/19/2013   SpO2 97%   BMI 28.94 kg/m     Wt Readings from Last 3 Encounters:  08/14/20 184 lb 12.8 oz (83.8 kg)  07/30/20 186 lb (84.4 kg)  04/30/20 183 lb (83 kg)     GEN: Well nourished, well developed in no acute distress HEENT: Normal NECK: No JVD; No carotid bruits LYMPHATICS: No lymphadenopathy CARDIAC: RRR, no murmurs, rubs, gallops RESPIRATORY:  Clear to auscultation without rales, wheezing or rhonchi  ABDOMEN: Soft, non-tender, non-distended MUSCULOSKELETAL:  No edema; No deformity  SKIN: Warm and dry NEUROLOGIC:  Alert and oriented x 3 PSYCHIATRIC:  Normal affect   ASSESSMENT AND PLAN:    1. CAD s/p CABG x 5 No angina. Continue exercise. Start ASA EC 81mg  qd. Continue statin and BB.   2. ICM - EF was 30-35% echo 07/2017. EF was 55% by cath 09/2017. EF was 50-55% by TEE during CABG 10/07/2017. Euvolemic. Occasional breathing difficult at night is due to GERD/IBS. Recommended early dinner.  - Continue BB and ARB  3. HTN - BP stable and well controlled on current medications.   4. HLD - 07/30/2020: Cholesterol, Total 129; HDL 39; LDL Chol Calc (NIH) 66; Triglycerides 137  - Continue statin   5. CKD III - Most recent lab 07/2020 showed stable renal function  6. Tobacco abuse - Recommended complete cessation.   Medication Adjustments/Labs and Tests Ordered: Current medicines are reviewed at length with the patient today.  Concerns regarding medicines are outlined above.  Orders Placed This Encounter   Procedures  . EKG 12-Lead   Meds ordered this encounter  Medications  . aspirin EC 81 MG tablet    Sig: Take 1 tablet (81 mg total) by mouth daily. Swallow whole.    Dispense:  90  tablet    Refill:  3    Patient Instructions  Medication Instructions:  Your physician has recommended you make the following change in your medication: 1.  START Aspirin 81 mg take 1 daily  *If you need a refill on your cardiac medications before your next appointment, please call your pharmacy*   Lab Work: None ordered  If you have labs (blood work) drawn today and your tests are completely normal, you will receive your results only by: Marland Kitchen MyChart Message (if you have MyChart) OR . A paper copy in the mail If you have any lab test that is abnormal or we need to change your treatment, we will call you to review the results.   Testing/Procedures: None ordered   Follow-Up: At [facility], you and your health needs are our priority.  As part of our continuing mission to provide you with exceptional heart care, we have created designated Provider Care Teams.  These Care Teams include your primary Cardiologist (physician) and Advanced Practice Providers (APPs -  Physician Assistants and Nurse Practitioners) who all work together to provide you with the care you need, when you need it.  We recommend signing up for the patient portal called "MyChart".  Sign up information is provided on this After Visit Summary.  MyChart is used to connect with patients for Virtual Visits (Telemedicine).  Patients are able to view lab/test results, encounter notes, upcoming appointments, etc.  Non-urgent messages can be sent to your provider as well.   To learn more about what you can do with MyChart, go to [email or web address].    Your next appointment:   12 month(s)  The format for your next appointment:   In Person  Provider:   You may see Mertie Moores, MD or one of the following Advanced Practice Providers  on your designated Care Team:    Richardson Dopp, PA-C  Robbie Lis, PA-C    Other Instructions      Signed, Leanor Kail, [place]  08/14/2020 2:16 PM    [place]

## 2020-08-14 NOTE — Patient Instructions (Signed)
Medication Instructions:  Your physician has recommended you make the following change in your medication: 1.  START Aspirin 81 mg take 1 daily  *If you need a refill on your cardiac medications before your next appointment, please call your pharmacy*   Lab Work: None ordered  If you have labs (blood work) drawn today and your tests are completely normal, you will receive your results only by: Marland Kitchen MyChart Message (if you have MyChart) OR . A paper copy in the mail If you have any lab test that is abnormal or we need to change your treatment, we will call you to review the results.   Testing/Procedures: None ordered   Follow-Up: At [facility], you and your health needs are our priority.  As part of our continuing mission to provide you with exceptional heart care, we have created designated Provider Care Teams.  These Care Teams include your primary Cardiologist (physician) and Advanced Practice Providers (APPs -  Physician Assistants and Nurse Practitioners) who all work together to provide you with the care you need, when you need it.  We recommend signing up for the patient portal called "MyChart".  Sign up information is provided on this After Visit Summary.  MyChart is used to connect with patients for Virtual Visits (Telemedicine).  Patients are able to view lab/test results, encounter notes, upcoming appointments, etc.  Non-urgent messages can be sent to your provider as well.   To learn more about what you can do with MyChart, go to [email or web address].    Your next appointment:   12 month(s)  The format for your next appointment:   In Person  Provider:   You may see Mertie Moores, MD or one of the following Advanced Practice Providers on your designated Care Team:    Richardson Dopp, PA-C  Robbie Lis, [place]    Other Instructions

## 2020-08-14 NOTE — Telephone Encounter (Signed)
Patient notified of results, verbally understood. No additional questions.

## 2020-09-02 ENCOUNTER — Other Ambulatory Visit: Payer: Self-pay

## 2020-09-02 DIAGNOSIS — K219 Gastro-esophageal reflux disease without esophagitis: Secondary | ICD-10-CM

## 2020-09-02 MED ORDER — FAMOTIDINE 20 MG PO TABS: 20.0000 mg | ORAL_TABLET | Freq: Two times a day (BID) | ORAL | 3 refills | Status: DC

## 2020-10-29 ENCOUNTER — Encounter: Payer: Self-pay | Admitting: Family Medicine

## 2020-10-29 ENCOUNTER — Other Ambulatory Visit: Payer: Self-pay

## 2020-10-29 ENCOUNTER — Ambulatory Visit (INDEPENDENT_AMBULATORY_CARE_PROVIDER_SITE_OTHER): Payer: Medicare HMO | Admitting: Family Medicine

## 2020-10-29 VITALS — BP 113/76 | HR 78 | Ht 67.0 in | Wt 179.0 lb

## 2020-10-29 DIAGNOSIS — F17200 Nicotine dependence, unspecified, uncomplicated: Secondary | ICD-10-CM | POA: Diagnosis not present

## 2020-10-29 DIAGNOSIS — Z8673 Personal history of transient ischemic attack (TIA), and cerebral infarction without residual deficits: Secondary | ICD-10-CM

## 2020-10-29 DIAGNOSIS — E785 Hyperlipidemia, unspecified: Secondary | ICD-10-CM | POA: Diagnosis not present

## 2020-10-29 DIAGNOSIS — N289 Disorder of kidney and ureter, unspecified: Secondary | ICD-10-CM

## 2020-10-29 DIAGNOSIS — I10 Essential (primary) hypertension: Secondary | ICD-10-CM | POA: Diagnosis not present

## 2020-10-29 NOTE — Progress Notes (Signed)
Patient [facility]   Established Patient Office Visit  Subjective:  Patient ID: Karen Ochoa, female    DOB: 05/26/1966  Age: 55 y.o. MRN: [id number]  CC:  Chief Complaint  Patient presents with  . Hypertension    HPI Karen Ochoa is a 55 year old female with a medical history significant for hypertension, chronic renal insufficiency state III, CAD s/p CABG x 5, history of ischemic cardiomyopathy, hyperlipidemia and history of CVA present for a 6 month follow up of chronic conditions. Patient says that she is doing well. She continues to have a poor appetite and typically eats 1 times per day. Patient says that she typically sleeps until noon and is not hungry upon awakening. She will typically drink juice throughout the day and typically eats dinner with her husband.  She has a history of hypertension. Patient does not check blood pressure at home. She has been taking all prescribed medications consistently. She does not exercise due to left side weakness. She denies any headache, dizziness, chest pain, shortness of breath, or lower extremity swelling. Patient is an everyday tobacco user. She has attempted smoking cessation over the past year without continued success.   Past Medical History:  Diagnosis Date  . Asthma 2017  . CAD in native artery    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  . Fibroids   . GERD (gastroesophageal reflux disease)   . Headache    "weekly" (07/20/2017)  . HLD (hyperlipidemia) 07/18/2017  . Hx MRSA infection   . Hypertension   . Insomnia due to medical condition   . Ischemic cardiomyopathy   . Stroke ([place]) 07/18/2017  . TIA (transient ischemic attack)    "I've had several"    Past Surgical History:  Procedure Laterality Date  . CORONARY ARTERY BYPASS GRAFT N/A 10/07/2017   Procedure: CORONARY ARTERY BYPASS GRAFTING (CABG) ON PUMP TIMES 5 USING LEFT INTERNAL MAMMARY ARTERY AND BILATERAL GREATER SAPHENOUS VEIN VIA ENDOHARDEST.;   Surgeon: Gaye Pollack, MD;  Location: [facility] OR;  Service: Open Heart Surgery;  Laterality: N/A;  . RIGHT/LEFT HEART CATH AND CORONARY ANGIOGRAPHY N/A 10/05/2017   Procedure: RIGHT/LEFT HEART CATH AND CORONARY ANGIOGRAPHY;  Surgeon: Belva Crome, MD;  Location: [facility];  Service: Cardiovascular;  Laterality: N/A;  . TEE WITHOUT CARDIOVERSION N/A 10/07/2017   Procedure: TRANSESOPHAGEAL ECHOCARDIOGRAM (TEE);  Surgeon: Gaye Pollack, MD;  Location: [facility];  Service: Open Heart Surgery;  Laterality: N/A;  . WISDOM TOOTH EXTRACTION      Family History  Problem Relation Age of Onset  . Hypertension Mother   . Diabetes Sister   . Asthma Brother   . Diabetes Brother   . Anesthesia problems Neg Hx     Social History   Socioeconomic History  . Marital status: Single    Spouse name: Not on file  . Number of children: Not on file  . Years of education: Not on file  . Highest education level: Not on file  Occupational History  . Not on file  Tobacco Use  . Smoking status: Current Every Day Smoker    Packs/day: 0.50    Years: 10.00    Pack years: 5.00    Types: Cigarettes  . Smokeless tobacco: Never Used  . Tobacco comment: 2 to 3 per day  Vaping Use  . Vaping Use: Never used  Substance and Sexual Activity  . Alcohol use: No    Alcohol/week: 2.0 standard  drinks    Types: 2 Glasses of wine per week    Comment: 08/04/2017--states no   . Drug use: No  . Sexual activity: Yes    Birth control/protection: None  Other Topics Concern  . Not on file  Social History Narrative  . Not on file   Social Determinants of Health   Financial Resource Strain: Not on file  Food Insecurity: Not on file  Transportation Needs: Not on file  Physical Activity: Not on file  Stress: Not on file  Social Connections: Not on file  Intimate Partner Violence: Not on file    Outpatient Medications Prior to Visit  Medication Sig Dispense Refill  . albuterol (VENTOLIN HFA) 108 (90 Base)  MCG/ACT inhaler INHALE 2 PUFFS INTO THE LUNGS EVERY 6 HOURS AS NEEDED FOR WHEEZING OR SHORTNESS OF BREATH 8.5 g 3  . amLODipine (NORVASC) 10 MG tablet Take 1 tablet (10 mg total) by mouth daily. 90 tablet 3  . aspirin EC 81 MG tablet Take 1 tablet (81 mg total) by mouth daily. Swallow whole. 90 tablet 3  . atorvastatin (LIPITOR) 80 MG tablet TAKE 1 TABLET BY MOUTH EVERY DAY AT 6 PM. PLEASE KEEP UPCOMING APPT IN DECEMBER FOR FUTURE REFILLS 90 tablet 01  . carvedilol (COREG) 25 MG tablet Take 1 tablet (25 mg total) by mouth 2 (two) times daily with a meal. 180 tablet 1  . famotidine (PEPCID) 20 MG tablet Take 1 tablet (20 mg total) by mouth 2 (two) times daily. 180 tablet 3  . levocetirizine (XYZAL) 5 MG tablet Take 1 tablet (5 mg total) by mouth every evening. 90 tablet 2  . linaclotide (LINZESS) 72 MCG capsule Take 1 capsule (72 mcg total) by mouth daily before breakfast. 90 capsule 1  . losartan (COZAAR) 25 MG tablet TAKE 1 TABLET(25 MG) BY MOUTH DAILY 90 tablet 3  . traZODone (DESYREL) 150 MG tablet Take 1 tablet (150 mg total) by mouth at bedtime as needed for sleep. 90 tablet 1   No facility-administered medications prior to visit.    Allergies  Allergen Reactions  . Penicillins Anaphylaxis and Other (See Comments)    Has patient had a PCN reaction causing immediate rash, facial/tongue/throat swelling, SOB or lightheadedness with hypotension: Yes Has patient had a PCN reaction causing severe rash involving mucus membranes or skin necrosis: Yes Has patient had a PCN reaction that required hospitalization Yes Has patient had a PCN reaction occurring within the last 10 years: No If all of the above answers are "NO", then may proceed with Cephalosporin use.   . Sulfonamide Derivatives Anaphylaxis  . Clonidine Derivatives Other (See Comments)    Patient passed out shortly after ingesting this at a doctor's office on 05/08/16 (was taken in conjunction with a tablet of Labetalol.)  . Flagyl  [Metronidazole] Swelling and Other (See Comments)    Skin blisters  . Labetalol Other (See Comments)    Patient passed out shortly after ingesting this at a doctor's office on 05/08/16 (was taken in conjunction with a tablet of Clonidine.)    ROS Review of Systems  Constitutional: Negative.   HENT: Negative.   Eyes: Negative.   Respiratory: Negative.   Cardiovascular: Negative.   Gastrointestinal: Negative.   Endocrine: Negative.   Genitourinary: Negative.   Musculoskeletal: Negative.   Psychiatric/Behavioral: Negative.       Objective:    Physical Exam Constitutional:      Appearance: Normal appearance.  Cardiovascular:     Rate and Rhythm:  Normal rate and regular rhythm.     Pulses: Normal pulses.  Pulmonary:     Effort: Pulmonary effort is normal.  Abdominal:     General: Abdomen is flat. Bowel sounds are normal.  Musculoskeletal:        General: Normal range of motion.  Skin:    General: Skin is warm.  Neurological:     General: No focal deficit present.     Mental Status: She is alert. Mental status is at baseline.  Psychiatric:        Mood and Affect: Mood normal.        Thought Content: Thought content normal.        Judgment: Judgment normal.     BP 113/76   Pulse 78   Ht 5\' 7"  (1.702 m)   Wt 179 lb (81.2 kg)   LMP 05/19/2013   BMI 28.04 kg/m  Wt Readings from Last 3 Encounters:  10/29/20 179 lb (81.2 kg)  08/14/20 184 lb 12.8 oz (83.8 kg)  07/30/20 186 lb (84.4 kg)     Health Maintenance Due  Topic Date Due  . PAP SMEAR-Modifier  05/21/2011    There are no preventive care reminders to display for this patient.  Lab Results  Component Value Date   TSH 0.856 07/20/2017   Lab Results  Component Value Date   WBC 7.6 12/08/2017   HGB 11.7 12/08/2017   HCT 35.3 12/08/2017   MCV 83 12/08/2017   PLT 257 12/08/2017   Lab Results  Component Value Date   NA 137 07/30/2020   K 4.0 07/30/2020   CO2 22 07/30/2020   GLUCOSE 108 (H)  07/30/2020   BUN 11 07/30/2020   CREATININE 1.31 (H) 07/30/2020   BILITOT 0.6 07/30/2020   ALKPHOS 117 07/30/2020   AST 47 (H) 07/30/2020   ALT 28 07/30/2020   PROT 6.9 07/30/2020   ALBUMIN 4.2 07/30/2020   CALCIUM 9.0 07/30/2020   ANIONGAP 10 10/14/2017   Lab Results  Component Value Date   CHOL 129 07/30/2020   Lab Results  Component Value Date   HDL 39 (L) 07/30/2020   Lab Results  Component Value Date   LDLCALC 66 07/30/2020   Lab Results  Component Value Date   TRIG 137 07/30/2020   Lab Results  Component Value Date   CHOLHDL 3.3 07/30/2020   Lab Results  Component Value Date   HGBA1C 5.8 (H) 10/06/2017      Assessment & Plan:   Problem List Items Addressed This Visit      Genitourinary   Renal insufficiency   Relevant Orders   Basic Metabolic Panel     Other   Hyperlipidemia with target LDL less than 70   History of CVA (cerebrovascular accident) - Primary    Other Visit Diagnoses    Tobacco dependence       Essential hypertension         1. History of CVA (cerebrovascular accident) Continue atorvastatin 80 mg daily and aspirin 81 mg daily  2. Hyperlipidemia with target LDL less than 70 The patient is asked to make an attempt to improve diet and exercise patterns to aid in medical management of this problem.   3. Renal insufficiency  - Basic Metabolic Panel  4. Tobacco dependence Smoking cessation instruction/counseling given:  counseled patient on the dangers of tobacco use, advised patient to stop smoking, and reviewed strategies to maximize success  5. Essential hypertension BP 113/76   Pulse 78  Ht 5\' 7"  (1.702 m)   Wt 179 lb (81.2 kg)   LMP 05/19/2013   BMI 28.04 kg/m  - Continue medication, monitor blood pressure at home. Continue DASH diet. Reminder to go to the ER if any CP, SOB, nausea, dizziness, severe HA, changes vision/speech, left arm numbness and tingling and jaw pain.    - Basic Metabolic Panel  Follow-up:  Return in about 3 months (around 01/28/2021) for gynecological examination.    Donia Pounds  APRN, MSN, FNP-C [facility] [address] [place], [place] [id number] [phone]

## 2020-10-29 NOTE — Patient Instructions (Signed)
Preventing High Cholesterol Cholesterol is a white, waxy substance similar to fat that the human body needs to help build cells. The liver makes all the cholesterol that a person's body needs. Having high cholesterol (hypercholesterolemia) increases your risk for heart disease and stroke. Extra or excess cholesterol comes from the food that you eat. High cholesterol can often be prevented with diet and lifestyle changes. If you already have high cholesterol, you can control it with diet, lifestyle changes, and medicines. How can high cholesterol affect me? If you have high cholesterol, fatty deposits (plaques) may build up on the walls of your blood vessels. The blood vessels that carry blood away from your heart are called arteries. Plaques make the arteries narrower and stiffer. This in turn can:  Restrict or block blood flow and cause blood clots to form.  Increase your risk for heart attack and stroke. What can increase my risk for high cholesterol? This condition is more likely to develop in people who:  Eat foods that are high in saturated fat or cholesterol. Saturated fat is mostly found in foods that come from animal sources.  Are overweight.  Are not getting enough exercise.  Have a family history of high cholesterol (familial hypercholesterolemia). What actions can I take to prevent this? Nutrition  Eat less saturated fat.  Avoid trans fats (partially hydrogenated oils). These are often found in margarine and in some baked goods, fried foods, and snacks bought in packages.  Avoid precooked or cured meat, such as bacon, sausages, or meat loaves.  Avoid foods and drinks that have added sugars.  Eat more fruits, vegetables, and whole grains.  Choose healthy sources of protein, such as fish, poultry, lean cuts of red meat, beans, peas, lentils, and nuts.  Choose healthy sources of fat, such as: ? Nuts. ? Vegetable oils, especially olive oil. ? Fish that have healthy fats,  such as omega-3 fatty acids. These fish include mackerel or salmon.   Lifestyle  Lose weight if you are overweight. Maintaining a healthy body mass index (BMI) can help prevent or control high cholesterol. It can also lower your risk for diabetes and high blood pressure. Ask your health care provider to help you with a diet and exercise plan to lose weight safely.  Do not use any products that contain nicotine or tobacco, such as cigarettes, e-cigarettes, and chewing tobacco. If you need help quitting, ask your health care provider. Alcohol use  Do not drink alcohol if: ? Your health care provider tells you not to drink. ? You are pregnant, may be pregnant, or are planning to become pregnant.  If you drink alcohol: ? Limit how much you use to:  0-1 drink a day for women.  0-2 drinks a day for men. ? Be aware of how much alcohol is in your drink. In the [place] one drink equals one 12 oz bottle of beer (355 mL), one 5 oz glass of wine (148 mL), or one 1 oz glass of hard liquor (44 mL). Activity  Get enough exercise. Do exercises as told by your health care provider.  Each week, do at least 150 minutes of exercise that takes a medium level of effort (moderate-intensity exercise). This kind of exercise: ? Makes your heart beat faster while allowing you to still be able to talk. ? Can be done in short sessions several times a day or longer sessions a few times a week. For example, on 5 days each week, you could walk fast or ride   your bike 3 times a day for 10 minutes each time.   Medicines  Your health care provider may recommend medicines to help lower cholesterol. This may be a medicine to lower the amount of cholesterol that your liver makes. You may need medicine if: ? Diet and lifestyle changes have not lowered your cholesterol enough. ? You have high cholesterol and other risk factors for heart disease or stroke.  Take over-the-counter and prescription medicines only as told by your  health care provider. General information  Manage your risk factors for high cholesterol. Talk with your health care provider about all your risk factors and how to lower your risk.  Manage other conditions that you have, such as diabetes or high blood pressure (hypertension).  Have blood tests to check your cholesterol levels at regular points in time as told by your health care provider.  Keep all follow-up visits as told by your health care provider. This is important. Where to find more information  American Heart Association: www.heart.org  National Heart, Lung, and Blood Institute: www.nhlbi.nih.gov Summary  High cholesterol increases your risk for heart disease and stroke. By keeping your cholesterol level low, you can reduce your risk for these conditions.  High cholesterol can often be prevented with diet and lifestyle changes.  Work with your health care provider to manage your risk factors, and have your blood tested regularly. This information is not intended to replace advice given to you by your health care provider. Make sure you discuss any questions you have with your health care provider. Document Revised: 04/18/2019 Document Reviewed: 04/18/2019 Elsevier Patient Education  2021 Elsevier Inc.  

## 2020-10-30 LAB — BASIC METABOLIC PANEL
BUN/Creatinine Ratio: 9 (ref 9–23)
BUN: 10 mg/dL (ref 6–24)
CO2: 23 mmol/L (ref 20–29)
Calcium: 9 mg/dL (ref 8.7–10.2)
Chloride: 99 mmol/L (ref 96–106)
Creatinine, Ser: 1.09 mg/dL — ABNORMAL HIGH (ref 0.57–1.00)
Glucose: 97 mg/dL (ref 65–99)
Potassium: 3.7 mmol/L (ref 3.5–5.2)
Sodium: 138 mmol/L (ref 134–144)
eGFR: 59 mL/min/{1.73_m2} — ABNORMAL LOW (ref >59)

## 2020-11-11 ENCOUNTER — Other Ambulatory Visit: Payer: Self-pay | Admitting: Family Medicine

## 2020-11-11 ENCOUNTER — Telehealth: Payer: Self-pay

## 2020-11-11 DIAGNOSIS — G4700 Insomnia, unspecified: Secondary | ICD-10-CM

## 2020-11-11 MED ORDER — TRAZODONE HCL 150 MG PO TABS: 150.0000 mg | ORAL_TABLET | Freq: Every evening | ORAL | 2 refills | Status: DC | PRN

## 2020-11-11 NOTE — Telephone Encounter (Signed)
Trazodone  For [employer] on [address]

## 2020-11-14 ENCOUNTER — Other Ambulatory Visit: Payer: Self-pay | Admitting: Family Medicine

## 2020-11-14 DIAGNOSIS — I10 Essential (primary) hypertension: Secondary | ICD-10-CM

## 2020-11-14 DIAGNOSIS — I25118 Atherosclerotic heart disease of native coronary artery with other forms of angina pectoris: Secondary | ICD-10-CM

## 2020-11-15 ENCOUNTER — Other Ambulatory Visit: Payer: Self-pay | Admitting: Family Medicine

## 2020-11-15 DIAGNOSIS — E785 Hyperlipidemia, unspecified: Secondary | ICD-10-CM

## 2020-12-26 ENCOUNTER — Ambulatory Visit (INDEPENDENT_AMBULATORY_CARE_PROVIDER_SITE_OTHER): Payer: Medicare HMO | Admitting: Nurse Practitioner

## 2020-12-26 VITALS — BP 146/74 | HR 77 | Temp 97.3°F | Resp 18 | Ht 67.0 in | Wt 178.0 lb

## 2020-12-26 DIAGNOSIS — F17200 Nicotine dependence, unspecified, uncomplicated: Secondary | ICD-10-CM

## 2020-12-26 DIAGNOSIS — Z0000 Encounter for general adult medical examination without abnormal findings: Secondary | ICD-10-CM

## 2020-12-26 NOTE — Patient Instructions (Addendum)
[name] , Thank you for taking time to come for your Medicare Wellness Visit. I appreciate your ongoing commitment to your health goals. Please review the following plan we discussed and let me know if I can assist you in the future.   These are the goals we discussed:  Goals     . DIET - EAT MORE FRUITS AND VEGETABLES        This is a list of the screening recommended for you and due dates:  Health Maintenance  Topic Date Due  . Pneumococcal Vaccination (1 - PCV) Never done  . Pap Smear  05/21/2011  . Zoster (Shingles) Vaccine (1 of 2) Never done  . COVID-19 Vaccine (4 - Booster for Pfizer series) 11/20/2020  . Mammogram  02/08/2021*  . Colon Cancer Screening  02/08/2021*  . Tetanus Vaccine  09/07/2027  . Hepatitis C Screening: USPSTF Recommendation to screen - Ages 59-79 yo.  Completed  . HIV Screening  Completed  . HPV Vaccine  Aged Out  *Topic was postponed. The date shown is not the original due date.    Fat and Cholesterol Restricted Eating Plan Eating a diet that limits fat and cholesterol may help lower your risk for heart disease and other conditions. Your body needs fat and cholesterol for basic functions, but eating too much of these things can be harmful to your health. Your health care provider may order lab tests to check your blood fat (lipid) and cholesterol levels. This helps your health care provider understand your risk for certain conditions and whether you need to make diet changes. Work with your health care provider or dietitian to make an eating plan that is right for you. Your plan includes: Limit your fat intake to ______% or less of your total calories a day. Limit your saturated fat intake to ______% or less of your total calories a day. Limit the amount of cholesterol in your diet to less than _________mg a day. Eat ___________ g of fiber a day. What are tips for following this plan? General guidelines If you are overweight, work with your health  care provider to lose weight safely. Losing just 5-10% of your body weight can improve your overall health and help prevent diseases such as diabetes and heart disease. Avoid: Foods with added sugar. Fried foods. Foods that contain partially hydrogenated oils, including stick margarine, some tub margarines, cookies, crackers, and other baked goods. Limit alcohol intake to no more than 1 drink a day for nonpregnant women and 2 drinks a day for men. One drink equals 12 oz of beer, 5 oz of wine, or 1 oz of hard liquor.   Reading food labels Check food labels for: Trans fats, partially hydrogenated oils, or high amounts of saturated fat. Avoid foods that contain saturated fat and trans fat. The amount of cholesterol in each serving. Try to eat no more than 200 mg of cholesterol each day. The amount of fiber in each serving. Try to eat at least 20-30 g of fiber each day. Choose foods with healthy fats, such as: Monounsaturated and polyunsaturated fats. These include olive and canola oil, flaxseeds, walnuts, almonds, and seeds. Omega-3 fats. These are found in foods such as salmon, mackerel, sardines, tuna, flaxseed oil, and ground flaxseeds. Choose grain products that have whole grains. Look for the word "whole" as the first word in the ingredient list. Cooking Cook foods using methods other than frying. Baking, boiling, grilling, and broiling are some healthy options. Eat more home-cooked  food and less restaurant, buffet, and fast food. Avoid cooking using saturated fats. Animal sources of saturated fats include meats, butter, and cream. Plant sources of saturated fats include palm oil, palm kernel oil, and coconut oil. Meal planning At meals, imagine dividing your plate into fourths: Fill one-half of your plate with vegetables and green salads. Fill one-fourth of your plate with whole grains. Fill one-fourth of your plate with lean protein foods. Eat fish that is high in omega-3 fats at least  two times a week. Eat more foods that contain fiber, such as whole grains, beans, apples, broccoli, carrots, peas, and barley. These foods help promote healthy cholesterol levels in the blood.   Recommended foods Grains Whole grains, such as whole wheat or whole grain breads, crackers, cereals, and pasta. Unsweetened oatmeal, bulgur, barley, quinoa, or brown rice. Corn or whole wheat flour tortillas. Vegetables Fresh or frozen vegetables (raw, steamed, roasted, or grilled). Green salads. Fruits All fresh, canned (in natural juice), or frozen fruits. Meats and other protein foods Ground beef (85% or leaner), grass-fed beef, or beef trimmed of fat. Skinless chicken or [place]. Ground chicken or [place]. Pork trimmed of fat. All fish and seafood. Egg whites. Dried beans, peas, or lentils. Unsalted nuts or seeds. Unsalted canned beans. Natural nut butters without added sugar and oil. Dairy Low-fat or nonfat dairy products, such as skim or 1% milk, 2% or reduced-fat cheeses, low-fat and fat-free ricotta or cottage cheese, or plain low-fat and nonfat yogurt. Fats and oils Tub margarine without trans fats. Light or reduced-fat mayonnaise and salad dressings. Avocado. Olive, canola, sesame, or safflower oils. The items listed above may not be a complete list of foods and beverages you can eat. Contact a dietitian for more information. Foods to avoid Grains White bread. White pasta. White rice. Cornbread. Bagels, pastries, and croissants. Crackers and snack foods that contain trans fat and hydrogenated oils. Vegetables Vegetables cooked in cheese, cream, or butter sauce. Fried vegetables. Fruits Canned fruit in heavy syrup. Fruit in cream or butter sauce. Fried fruit. Meats and other protein foods Fatty cuts of meat. Ribs, chicken wings, bacon, sausage, bologna, salami, chitterlings, fatback, hot dogs, bratwurst, and packaged lunch meats. Liver and organ meats. Whole eggs and egg yolks. Chicken and  [place] with skin. Fried meat. Dairy Whole or 2% milk, cream, half-and-half, and cream cheese. Whole milk cheeses. Whole-fat or sweetened yogurt. Full-fat cheeses. Nondairy creamers and whipped toppings. Processed cheese, cheese spreads, and cheese curds. Beverages Alcohol. Sugar-sweetened drinks such as sodas, lemonade, and fruit drinks. Fats and oils Butter, stick margarine, lard, shortening, ghee, or bacon fat. Coconut, palm kernel, and palm oils. Sweets and desserts Corn syrup, sugars, honey, and molasses. Candy. Jam and jelly. Syrup. Sweetened cereals. Cookies, pies, cakes, donuts, muffins, and ice cream. The items listed above may not be a complete list of foods and beverages you should avoid. Contact a dietitian for more information. Summary Your body needs fat and cholesterol for basic functions. However, eating too much of these things can be harmful to your health. Work with your health care provider and dietitian to follow a diet low in fat and cholesterol. Doing this may help lower your risk for heart disease and other conditions. Choose healthy fats, such as monounsaturated and polyunsaturated fats, and foods high in omega-3 fatty acids. Eat fiber-rich foods, such as whole grains, beans, peas, fruits, and vegetables. Limit or avoid alcohol, fried foods, and foods high in saturated fats, partially hydrogenated oils, and sugar. This information  is not intended to replace advice given to you by your health care provider. Make sure you discuss any questions you have with your health care provider. Document Revised: 03/06/2020 Document Reviewed: 11/08/2019 Elsevier Patient Education  2021 [place] Prevention in the Home, Adult Falls can cause injuries and can happen to people of all ages. There are many things you can do to make your home safe and to help prevent falls. Ask for help when making these changes. What actions can I take to prevent falls? General  Instructions Use good lighting in all rooms. Replace any light bulbs that burn out. Turn on the lights in dark areas. Use night-lights. Keep items that you use often in easy-to-reach places. Lower the shelves around your home if needed. Set up your furniture so you have a clear path. Avoid moving your furniture around. Do not have throw rugs or other things on the floor that can make you trip. Avoid walking on wet floors. If any of your floors are uneven, fix them. Add color or contrast paint or tape to clearly mark and help you see: Grab bars or handrails. First and last steps of staircases. Where the edge of each step is. If you use a stepladder: Make sure that it is fully opened. Do not climb a closed stepladder. Make sure the sides of the stepladder are locked in place. Ask someone to hold the stepladder while you use it. Know where your pets are when moving through your home. What can I do in the bathroom? Keep the floor dry. Clean up any water on the floor right away. Remove soap buildup in the tub or shower. Use nonskid mats or decals on the floor of the tub or shower. Attach bath mats securely with double-sided, nonslip rug tape. If you need to sit down in the shower, use a plastic, nonslip stool. Install grab bars by the toilet and in the tub and shower. Do not use towel bars as grab bars.      What can I do in the bedroom? Make sure that you have a light by your bed that is easy to reach. Do not use any sheets or blankets for your bed that hang to the floor. Have a firm chair with side arms that you can use for support when you get dressed. What can I do in the kitchen? Clean up any spills right away. If you need to reach something above you, use a step stool with a grab bar. Keep electrical cords out of the way. Do not use floor polish or wax that makes floors slippery. What can I do with my stairs? Do not leave any items on the stairs. Make sure that you have a light  switch at the top and the bottom of the stairs. Make sure that there are handrails on both sides of the stairs. Fix handrails that are broken or loose. Install nonslip stair treads on all your stairs. Avoid having throw rugs at the top or bottom of the stairs. Choose a carpet that does not hide the edge of the steps on the stairs. Check carpeting to make sure that it is firmly attached to the stairs. Fix carpet that is loose or worn. What can I do on the outside of my home? Use bright outdoor lighting. Fix the edges of walkways and driveways and fix any cracks. Remove anything that might make you trip as you walk through a door, such as a raised step or threshold.  Trim any bushes or trees on paths to your home. Check to see if handrails are loose or broken and that both sides of all steps have handrails. Install guardrails along the edges of any raised decks and porches. Clear paths of anything that can make you trip, such as tools or rocks. Have leaves, snow, or ice cleared regularly. Use sand or salt on paths during winter. Clean up any spills in your garage right away. This includes grease or oil spills. What other actions can I take? Wear shoes that: Have a low heel. Do not wear high heels. Have rubber bottoms. Feel good on your feet and fit well. Are closed at the toe. Do not wear open-toe sandals. Use tools that help you move around if needed. These include: Canes. Walkers. Scooters. Crutches. Review your medicines with your doctor. Some medicines can make you feel dizzy. This can increase your chance of falling. Ask your doctor what else you can do to help prevent falls. Where to find more information Centers for Disease Control and Prevention, [place]: http://www.wolf.info/ National Institute on Aging: http://kim-miller.com/ Contact a doctor if: You are afraid of falling at home. You feel weak, drowsy, or dizzy at home. You fall at home. Summary There are many simple things that you can  do to make your home safe and to help prevent falls. Ways to make your home safe include removing things that can make you trip and installing grab bars in the bathroom. Ask for help when making these changes in your home. This information is not intended to replace advice given to you by your health care provider. Make sure you discuss any questions you have with your health care provider. Document Revised: 02/07/2020 Document Reviewed: 02/07/2020 Elsevier Patient Education  [place] Maintenance, Female Adopting a healthy lifestyle and getting preventive care are important in promoting health and wellness. Ask your health care provider about: The right schedule for you to have regular tests and exams. Things you can do on your own to prevent diseases and keep yourself healthy. What should I know about diet, weight, and exercise? Eat a healthy diet Eat a diet that includes plenty of vegetables, fruits, low-fat dairy products, and lean protein. Do not eat a lot of foods that are high in solid fats, added sugars, or sodium.   Maintain a healthy weight Body mass index (BMI) is used to identify weight problems. It estimates body fat based on height and weight. Your health care provider can help determine your BMI and help you achieve or maintain a healthy weight. Get regular exercise Get regular exercise. This is one of the most important things you can do for your health. Most adults should: Exercise for at least 150 minutes each week. The exercise should increase your heart rate and make you sweat (moderate-intensity exercise). Do strengthening exercises at least twice a week. This is in addition to the moderate-intensity exercise. Spend less time sitting. Even light physical activity can be beneficial. Watch cholesterol and blood lipids Have your blood tested for lipids and cholesterol at 57 years of age, then have this test every 5 years. Have your cholesterol levels  checked more often if: Your lipid or cholesterol levels are high. You are older than 57 years of age. You are at high risk for heart disease. What should I know about cancer screening? Depending on your health history and family history, you may need to have cancer screening at various ages. This may include screening for:  Breast cancer. Cervical cancer. Colorectal cancer. Skin cancer. Lung cancer. What should I know about heart disease, diabetes, and high blood pressure? Blood pressure and heart disease High blood pressure causes heart disease and increases the risk of stroke. This is more likely to develop in people who have high blood pressure readings, are of African descent, or are overweight. Have your blood pressure checked: Every 3-5 years if you are 3-70 years of age. Every year if you are 98 years old or older. Diabetes Have regular diabetes screenings. This checks your fasting blood sugar level. Have the screening done: Once every three years after age 31 if you are at a normal weight and have a low risk for diabetes. More often and at a younger age if you are overweight or have a high risk for diabetes. What should I know about preventing infection? Hepatitis B If you have a higher risk for hepatitis B, you should be screened for this virus. Talk with your health care provider to find out if you are at risk for hepatitis B infection. Hepatitis C Testing is recommended for: Everyone born from [birth year] through 1967. Anyone with known risk factors for hepatitis C. Sexually transmitted infections (STIs) Get screened for STIs, including gonorrhea and chlamydia, if: You are sexually active and are younger than 57 years of age. You are older than 57 years of age and your health care provider tells you that you are at risk for this type of infection. Your sexual activity has changed since you were last screened, and you are at increased risk for chlamydia or gonorrhea. Ask your  health care provider if you are at risk. Ask your health care provider about whether you are at high risk for HIV. Your health care provider may recommend a prescription medicine to help prevent HIV infection. If you choose to take medicine to prevent HIV, you should first get tested for HIV. You should then be tested every 3 months for as long as you are taking the medicine. Pregnancy If you are about to stop having your period (premenopausal) and you may become pregnant, seek counseling before you get pregnant. Take 400 to 800 micrograms (mcg) of folic acid every day if you become pregnant. Ask for birth control (contraception) if you want to prevent pregnancy. Osteoporosis and menopause Osteoporosis is a disease in which the bones lose minerals and strength with aging. This can result in bone fractures. If you are 28 years old or older, or if you are at risk for osteoporosis and fractures, ask your health care provider if you should: Be screened for bone loss. Take a calcium or vitamin D supplement to lower your risk of fractures. Be given hormone replacement therapy (HRT) to treat symptoms of menopause. Follow these instructions at home: Lifestyle Do not use any products that contain nicotine or tobacco, such as cigarettes, e-cigarettes, and chewing tobacco. If you need help quitting, ask your health care provider. Do not use street drugs. Do not share needles. Ask your health care provider for help if you need support or information about quitting drugs. Alcohol use Do not drink alcohol if: Your health care provider tells you not to drink. You are pregnant, may be pregnant, or are planning to become pregnant. If you drink alcohol: Limit how much you use to 0-1 drink a day. Limit intake if you are breastfeeding. Be aware of how much alcohol is in your drink. In the [place] one drink equals one 12 oz bottle of beer (355  mL), one 5 oz glass of wine (148 mL), or one 1 oz glass of hard liquor  (44 mL). General instructions Schedule regular health, dental, and eye exams. Stay current with your vaccines. Tell your health care provider if: You often feel depressed. You have ever been abused or do not feel safe at home. Summary Adopting a healthy lifestyle and getting preventive care are important in promoting health and wellness. Follow your health care provider's instructions about healthy diet, exercising, and getting tested or screened for diseases. Follow your health care provider's instructions on monitoring your cholesterol and blood pressure. This information is not intended to replace advice given to you by your health care provider. Make sure you discuss any questions you have with your health care provider. Document Revised: 06/29/2018 Document Reviewed: 06/29/2018 Elsevier Patient Education  2021 [place] A mammogram is a low energy X-ray of the breasts that is done to check for abnormal changes. This procedure can screen for and detect any changes that may indicate breast cancer. Mammograms are regularly done on women. A man may have a mammogram if he has a lump or swelling in his breast. A mammogram can also identify other changes and variations in the breast, such as: Inflammation of the breast tissue (mastitis). An infected area that contains a collection of pus (abscess). A fluid-filled sac (cyst). Fibrocystic changes. This is when breast tissue becomes denser, which can make the tissue feel rope-like or uneven under the skin. Tumors that are not cancerous (benign). Tell a health care provider: About any allergies you have. If you have breast implants. If you have had previous breast disease, biopsy, or surgery. If you are breastfeeding. If you are younger than age 69. If you have a family history of breast cancer. Whether you are pregnant or may be pregnant. What are the risks? Generally, this is a safe procedure. However, problems may occur,  including: Exposure to radiation. Radiation levels are very low with this test. The results being misinterpreted. The need for further tests. The inability of the mammogram to detect certain cancers. What happens before the procedure? Schedule your test about 1-2 weeks after your menstrual period if you are still menstruating. This is usually when your breasts are the least tender. If you have had a mammogram done at a different facility in the past, get the mammogram X-rays or have them sent to your current exam facility. The new and old images will be compared. Wash your breasts and underarms on the day of the test. Do not wear deodorants, perfumes, lotions, or powders anywhere on your body on the day of the test. Remove any jewelry from your neck. Wear clothes that you can change into and out of easily. What happens during the procedure? You will undress from the waist up and put on a gown that opens in the front. You will stand in front of the X-ray machine. Each breast will be placed between two plastic or glass plates. The plates will compress your breast for a few seconds. Try to stay as relaxed as possible during the procedure. This does not cause any harm to your breasts and any discomfort you feel will be very brief. X-rays will be taken from different angles of each breast. The procedure may vary among health care providers and hospitals.   What happens after the procedure? The mammogram will be examined by a specialist (radiologist). You may need to repeat certain parts of the test, depending on the quality  of the images. This is commonly done if the radiologist needs a better view of the breast tissue. You may resume your normal activities. It is up to you to get the results of your procedure. Ask your health care provider, or the department that is doing the procedure, when your results will be ready. Summary A mammogram is a low energy X-ray of the breasts that is done to check  for abnormal changes. A man may have a mammogram if he has a lump or swelling in his breast. If you have had a mammogram done at a different facility in the past, get the mammogram X-rays or have them sent to your current exam facility in order to compare them. Schedule your test about 1-2 weeks after your menstrual period if you are still menstruating. For this test, each breast will be placed between two plastic or glass plates. The plates will compress your breast for a few seconds. Ask when your test results will be ready. Make sure you get your test results. This information is not intended to replace advice given to you by your health care provider. Make sure you discuss any questions you have with your health care provider. Document Revised: 02/24/2018 Document Reviewed: 02/24/2018 Elsevier Patient Education  [place].  Steps to Quit Smoking Smoking tobacco is the leading cause of preventable death. It can affect almost every organ in the body. Smoking puts you and those around you at risk for developing many serious chronic diseases. Quitting smoking can be difficult, but it is one of the best things that you can do for your health. It is never too late to quit. How do I get ready to quit? When you decide to quit smoking, create a plan to help you succeed. Before you quit: Pick a date to quit. Set a date within the next 2 weeks to give you time to prepare. Write down the reasons why you are quitting. Keep this list in places where you will see it often. Tell your family, friends, and co-workers that you are quitting. Support from your loved ones can make quitting easier. Talk with your health care provider about your options for quitting smoking. Find out what treatment options are covered by your health insurance. Identify people, places, things, and activities that make you want to smoke (triggers). Avoid them. What first steps can I take to quit smoking? Throw away all  cigarettes at home, at work, and in your car. Throw away smoking accessories, such as Scientist, research (medical). Clean your car. Make sure to empty the ashtray. Clean your home, including curtains and carpets. What strategies can I use to quit smoking? Talk with your health care provider about combining strategies, such as taking medicines while you are also receiving in-person counseling. Using these two strategies together makes you more likely to succeed in quitting than if you used either strategy on its own. If you are pregnant or breastfeeding, talk with your health care provider about finding counseling or other support strategies to quit smoking. Do not take medicine to help you quit smoking unless your health care provider tells you to do so. To quit smoking: Quit right away Quit smoking completely, instead of gradually reducing how much you smoke over a period of time. Research shows that stopping smoking right away is more successful than gradually quitting. Attend in-person counseling to help you build problem-solving skills. You are more likely to succeed in quitting if you attend counseling sessions regularly. Even short  sessions of 10 minutes can be effective. Take medicine You may take medicines to help you quit smoking. Some medicines require a prescription and some you can purchase over-the-counter. Medicines may have nicotine in them to replace the nicotine in cigarettes. Medicines may: Help to stop cravings. Help to relieve withdrawal symptoms. Your health care provider may recommend: Nicotine patches, gum, or lozenges. Nicotine inhalers or sprays. Non-nicotine medicine that is taken by mouth. Find resources Find resources and support systems that can help you to quit smoking and remain smoke-free after you quit. These resources are most helpful when you use them often. They include: Online chats with a Social worker. Telephone quitlines. Printed Furniture conservator/restorer. Support  groups or group counseling. Text messaging programs. Mobile phone apps or applications. Use apps that can help you stick to your quit plan by providing reminders, tips, and encouragement. There are many free apps for mobile devices as well as websites. Examples include Quit Guide from the [employer] and smokefree.gov   What things can I do to make it easier to quit? Reach out to your family and friends for support and encouragement. Call telephone quitlines (1-800-QUIT-NOW), reach out to support groups, or work with a counselor for support. Ask people who smoke to avoid smoking around you. Avoid places that trigger you to smoke, such as bars, parties, or smoke-break areas at work. Spend time with people who do not smoke. Lessen the stress in your life. Stress can be a smoking trigger for some people. To lessen stress, try: Exercising regularly. Doing deep-breathing exercises. Doing yoga. Meditating. Performing a body scan. This involves closing your eyes, scanning your body from head to toe, and noticing which parts of your body are particularly tense. Try to relax the muscles in those areas.   How will I feel when I quit smoking? Day 1 to 3 weeks Within the first 24 hours of quitting smoking, you may start to feel withdrawal symptoms. These symptoms are usually most noticeable 2-3 days after quitting, but they usually do not last for more than 2-3 weeks. You may experience these symptoms: Mood swings. Restlessness, anxiety, or irritability. Trouble concentrating. Dizziness. Strong cravings for sugary foods and nicotine. Mild weight gain. Constipation. Nausea. Coughing or a sore throat. Changes in how the medicines that you take for unrelated issues work in your body. Depression. Trouble sleeping (insomnia). Week 3 and afterward After the first 2-3 weeks of quitting, you may start to notice more positive results, such as: Improved sense of smell and taste. Decreased coughing and sore  throat. Slower heart rate. Lower blood pressure. Clearer skin. The ability to breathe more easily. Fewer sick days. Quitting smoking can be very challenging. Do not get discouraged if you are not successful the first time. Some people need to make many attempts to quit before they achieve long-term success. Do your best to stick to your quit plan, and talk with your health care provider if you have any questions or concerns. Summary Smoking tobacco is the leading cause of preventable death. Quitting smoking is one of the best things that you can do for your health. When you decide to quit smoking, create a plan to help you succeed. Quit smoking right away, not slowly over a period of time. When you start quitting, seek help from your health care provider, family, or friends. This information is not intended to replace advice given to you by your health care provider. Make sure you discuss any questions you have with your health care provider.  Document Revised: 03/31/2019 Document Reviewed: 09/24/2018 Elsevier Patient Education  [place].  Steps to Quit Smoking Smoking tobacco is the leading cause of preventable death. It can affect almost every organ in the body. Smoking puts you and people around you at risk for many serious, long-lasting (chronic) diseases. Quitting smoking can be hard, but it is one of the best things that you can do for your health. It is never too late to quit. How do I get ready to quit? When you decide to quit smoking, make a plan to help you succeed. Before you quit: Pick a date to quit. Set a date within the next 2 weeks to give you time to prepare. Write down the reasons why you are quitting. Keep this list in places where you will see it often. Tell your family, friends, and co-workers that you are quitting. Their support is important. Talk with your doctor about the choices that may help you quit. Find out if your health insurance will pay for these  treatments. Know the people, places, things, and activities that make you want to smoke (triggers). Avoid them. What first steps can I take to quit smoking? Throw away all cigarettes at home, at work, and in your car. Throw away the things that you use when you smoke, such as ashtrays and lighters. Clean your car. Make sure to empty the ashtray. Clean your home, including curtains and carpets. What can I do to help me quit smoking? Talk with your doctor about taking medicines and seeing a counselor at the same time. You are more likely to succeed when you do both. If you are pregnant or breastfeeding, talk with your doctor about counseling or other ways to quit smoking. Do not take medicine to help you quit smoking unless your doctor tells you to do so. To quit smoking: Quit right away Quit smoking totally, instead of slowly cutting back on how much you smoke over a period of time. Go to counseling. You are more likely to quit if you go to counseling sessions regularly. Take medicine You may take medicines to help you quit. Some medicines need a prescription, and some you can buy over-the-counter. Some medicines may contain a drug called nicotine to replace the nicotine in cigarettes. Medicines may: Help you to stop having the desire to smoke (cravings). Help to stop the problems that come when you stop smoking (withdrawal symptoms). Your doctor may ask you to use: Nicotine patches, gum, or lozenges. Nicotine inhalers or sprays. Non-nicotine medicine that is taken by mouth. Find resources Find resources and other ways to help you quit smoking and remain smoke-free after you quit. These resources are most helpful when you use them often. They include: Online chats with a Social worker. Phone quitlines. Printed Furniture conservator/restorer. Support groups or group counseling. Text messaging programs. Mobile phone apps. Use apps on your mobile phone or tablet that can help you stick to your quit plan.  There are many free apps for mobile phones and tablets as well as websites. Examples include Quit Guide from the [employer] and smokefree.gov   What things can I do to make it easier to quit? Talk to your family and friends. Ask them to support and encourage you. Call a phone quitline (1-800-QUIT-NOW), reach out to support groups, or work with a Social worker. Ask people who smoke to not smoke around you. Avoid places that make you want to smoke, such as: Bars. Parties. Smoke-break areas at work. Spend time with people who  do not smoke. Lower the stress in your life. Stress can make you want to smoke. Try these things to help your stress: Getting regular exercise. Doing deep-breathing exercises. Doing yoga. Meditating. Doing a body scan. To do this, close your eyes, focus on one area of your body at a time from head to toe. Notice which parts of your body are tense. Try to relax the muscles in those areas.   How will I feel when I quit smoking? Day 1 to 3 weeks Within the first 24 hours, you may start to have some problems that come from quitting tobacco. These problems are very bad 2-3 days after you quit, but they do not often last for more than 2-3 weeks. You may get these symptoms: Mood swings. Feeling restless, nervous, angry, or annoyed. Trouble concentrating. Dizziness. Strong desire for high-sugar foods and nicotine. Weight gain. Trouble pooping (constipation). Feeling like you may vomit (nausea). Coughing or a sore throat. Changes in how the medicines that you take for other issues work in your body. Depression. Trouble sleeping (insomnia). Week 3 and afterward After the first 2-3 weeks of quitting, you may start to notice more positive results, such as: Better sense of smell and taste. Less coughing and sore throat. Slower heart rate. Lower blood pressure. Clearer skin. Better breathing. Fewer sick days. Quitting smoking can be hard. Do not give up if you fail the first time.  Some people need to try a few times before they succeed. Do your best to stick to your quit plan, and talk with your doctor if you have any questions or concerns. Summary Smoking tobacco is the leading cause of preventable death. Quitting smoking can be hard, but it is one of the best things that you can do for your health. When you decide to quit smoking, make a plan to help you succeed. Quit smoking right away, not slowly over a period of time. When you start quitting, seek help from your doctor, family, or friends. This information is not intended to replace advice given to you by your health care provider. Make sure you discuss any questions you have with your health care provider. Document Revised: 03/31/2019 Document Reviewed: 09/24/2018 Elsevier Patient Education  [place].

## 2020-12-26 NOTE — Progress Notes (Signed)
Subjective:   Karen Ochoa is a 55 y.o. female who presents for Medicare Annual (Subsequent) preventive examination.  Review of Systems    Review of Systems  Constitutional: Negative.   HENT: Negative.    Eyes: Negative.   Respiratory: Negative.    Cardiovascular: Negative.   Gastrointestinal: Negative.   Genitourinary: Negative.   Musculoskeletal: Negative.   Skin: Negative.   Neurological: Negative.   Endo/Heme/Allergies: Negative.   Psychiatric/Behavioral: Negative.     Cardiac Risk Factors include: advanced age (>87men, >82 women);dyslipidemia;hypertension;sedentary lifestyle;smoking/ tobacco exposure     Objective:    Today's Vitals   12/26/20 1309  BP: (!) 146/74  Pulse: 77  Resp: 18  Temp: (!) 97.3 F (36.3 C)  SpO2: 100%  Weight: 178 lb (80.7 kg)  Height: 5\' 7"  (1.702 m)   Body mass index is 27.88 kg/m.  Advanced Directives 12/26/2020 10/06/2017 10/05/2017 07/20/2017 07/18/2017 01/21/2017 11/29/2016  Does Patient Have a Medical Advance Directive? No No No No No No No  Would patient like information on creating a medical advance directive? No - Patient declined No - Patient declined - No - Patient declined - No - Patient declined -    Current Medications (verified) Outpatient Encounter Medications as of 12/26/2020  Medication Sig   albuterol (VENTOLIN HFA) 108 (90 Base) MCG/ACT inhaler INHALE 2 PUFFS INTO THE LUNGS EVERY 6 HOURS AS NEEDED FOR WHEEZING OR SHORTNESS OF BREATH   amLODipine (NORVASC) 10 MG tablet Take 1 tablet (10 mg total) by mouth daily.   aspirin EC 81 MG tablet Take 1 tablet (81 mg total) by mouth daily. Swallow whole.   atorvastatin (LIPITOR) 80 MG tablet TAKE 1 TABLET BY MOUTH EVERY DAY AT 6 PM.   carvedilol (COREG) 25 MG tablet TAKE 1 TABLET(25 MG) BY MOUTH TWICE DAILY WITH A MEAL   famotidine (PEPCID) 20 MG tablet Take 1 tablet (20 mg total) by mouth 2 (two) times daily.   levocetirizine (XYZAL) 5 MG tablet Take 1 tablet (5 mg total) by mouth  every evening.   linaclotide (LINZESS) 72 MCG capsule Take 1 capsule (72 mcg total) by mouth daily before breakfast.   losartan (COZAAR) 25 MG tablet TAKE 1 TABLET(25 MG) BY MOUTH DAILY   traZODone (DESYREL) 150 MG tablet Take 1 tablet (150 mg total) by mouth at bedtime as needed for sleep.   No facility-administered encounter medications on file as of 12/26/2020.    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   Fibroids    GERD (gastroesophageal reflux disease)    Headache    "weekly" (07/20/2017)   HLD (hyperlipidemia) 07/18/2017   Hx MRSA infection    Hypertension    Insomnia due to medical condition    Ischemic cardiomyopathy    Stroke ([place]) 07/18/2017   TIA (transient ischemic attack)    "I've had several"   Past Surgical History:  Procedure Laterality Date   CORONARY ARTERY BYPASS GRAFT N/A 10/07/2017   Procedure: CORONARY ARTERY BYPASS GRAFTING (CABG) ON PUMP TIMES 5 USING LEFT INTERNAL MAMMARY ARTERY AND BILATERAL GREATER SAPHENOUS VEIN VIA ENDOHARDEST.;  Surgeon: Gaye Pollack, MD;  Location: [facility] OR;  Service: Open Heart Surgery;  Laterality: N/A;   RIGHT/LEFT HEART CATH AND CORONARY ANGIOGRAPHY N/A 10/05/2017   Procedure: RIGHT/LEFT HEART CATH AND CORONARY ANGIOGRAPHY;  Surgeon: Belva Crome, MD;  Location: [facility];  Service: Cardiovascular;  Laterality: N/A;   TEE WITHOUT CARDIOVERSION N/A 10/07/2017   Procedure: TRANSESOPHAGEAL ECHOCARDIOGRAM (TEE);  Surgeon: Gaye Pollack, MD;  Location: [facility];  Service: Open Heart Surgery;  Laterality: N/A;   WISDOM TOOTH EXTRACTION     Family History  Problem Relation Age of Onset   Hypertension Mother    Diabetes Sister    Asthma Brother    Diabetes Brother    Anesthesia problems Neg Hx    Social History   Socioeconomic History    Marital status: Single    Spouse name: Not on file   Number of children: Not on file   Years of education: Not on file   Highest education level: Not on file  Occupational History   Not on file  Tobacco Use   Smoking status: Every Day    Packs/day: 0.50    Years: 10.00    Pack years: 5.00    Types: Cigarettes   Smokeless tobacco: Never   Tobacco comments:    2 to 3 per day  Vaping Use   Vaping Use: Never used  Substance and Sexual Activity   Alcohol use: No    Alcohol/week: 2.0 standard drinks    Types: 2 Glasses of wine per week    Comment: 08/04/2017--states no    Drug use: No   Sexual activity: Yes    Birth control/protection: None  Other Topics Concern   Not on file  Social History Narrative   Not on file   Social Determinants of Health   Financial Resource Strain: Low Risk    Difficulty of Paying Living Expenses: Not very hard  Food Insecurity: No Food Insecurity   Worried About Charity fundraiser in the Last Year: Never true   Ran Out of Food in the Last Year: Never true  Transportation Needs: No Transportation Needs   Lack of Transportation (Medical): No   Lack of Transportation (Non-Medical): No  Physical Activity: Insufficiently Active   Days of Exercise per Week: 3 days   Minutes of Exercise per Session: 10 min  Stress: No Stress Concern Present   Feeling of Stress : Not at all  Social Connections: Socially Integrated   Frequency of Communication with Friends and Family: More than three times a week   Frequency of Social Gatherings with Friends and Family: Once a week   Attends Religious Services: More than 4 times per year   Active Member of [employer] or Organizations: Yes   Attends Music therapist: More than 4 times per year   Marital Status: Married    Tobacco Counseling Ready to quit: Not Answered Counseling given: Not Answered Tobacco comments: 2 to 3 per day   Clinical Intake:  Pre-visit preparation completed: No  Pain :  No/denies pain     BMI - recorded: 27.88 Nutritional Status: BMI 25 -29 Overweight Nutritional Risks: None Diabetes: No  How often do you need to have someone help you when you read instructions, pamphlets, or other written materials from your doctor or pharmacy?: 1 - Never What is the last grade level you completed in school?: 12th  Diabetic?no  Interpreter Needed?: No      Activities of Daily Living In your present state of health, do you have any difficulty performing the following activities: 12/26/2020 04/30/2020  Hearing? N N  Vision? Y Y  Difficulty concentrating or making decisions? N N  Walking or  climbing stairs? Y Y  Dressing or bathing? N Y  Comment - sometimes  Doing errands, shopping? N Y  Conservation officer, nature and eating ? Y -  Using the Toilet? N -  In the past six months, have you accidently leaked urine? N -  Do you have problems with loss of bowel control? N -  Managing your Medications? N -  Managing your Finances? N -  Housekeeping or managing your Housekeeping? N -  Some recent data might be hidden    Patient Care Team: [name], FNP as PCP - General (Family Medicine) Nahser, Wonda Cheng, MD as PCP - Cardiology (Cardiology)  Indicate any recent Medical Services you may have received from other than [facility] providers in the past year (date may be approximate).     Assessment:   This is a routine wellness examination for [place].  Dietary issues and exercise activities discussed: Current Exercise Habits: Home exercise routine, Type of exercise: Other - see comments (exercise bike), Time (Minutes): 10, Frequency (Times/Week): 3, Weekly Exercise (Minutes/Week): 30, Intensity: Moderate, Exercise limited by: neurologic condition(s)   Goals Addressed             This Visit's Progress    DIET - EAT MORE FRUITS AND VEGETABLES         Depression Screen PHQ 2/9 Scores 12/26/2020 10/03/2019 07/04/2019 08/11/2018 05/11/2018 03/10/2018 09/06/2017  PHQ - 2  Score 0 0 0 0 5 2 4   PHQ- 9 Score - - - - 8 - -    Fall Risk Fall Risk  12/26/2020 01/30/2020 10/03/2019 07/04/2019 08/11/2018  Falls in the past year? 0 0 0 0 0  Number falls in past yr: 0 0 - - -  Injury with Fall? 0 0 - - -  Risk for fall due to : No Fall Risks - - - -  Follow up Education provided - - - -    FALL RISK PREVENTION PERTAINING TO THE HOME:  Any stairs in or around the home? No  If so, are there any without handrails?  na Home free of loose throw rugs in walkways, pet beds, electrical cords, etc? Yes  Adequate lighting in your home to reduce risk of falls? Yes   ASSISTIVE DEVICES UTILIZED TO PREVENT FALLS:  Life alert? No  Use of a cane, walker or w/c? Yes  Grab bars in the bathroom? No  Shower chair or bench in shower? No  Elevated toilet seat or a handicapped toilet? No   TIMED UP AND GO:  Was the test performed? Yes .  Length of time to ambulate 10 feet: 3 sec.   Gait steady and fast with assistive device  Cognitive Function: MMSE - Mini Mental State Exam 12/26/2020  Orientation to time 5  Orientation to Place 5  Registration 3  Attention/ Calculation 5  Recall 3  Language- name 2 objects 2  Language- repeat 1  Language- follow 3 step command 3  Language- read & follow direction 1  Write a sentence 1  Copy design 1  Total score 30        Immunizations Immunization History  Administered Date(s) Administered   PFIZER(Purple Top)SARS-COV-2 Vaccination 10/12/2019, 11/06/2019, 07/23/2020   Pneumococcal Polysaccharide-23 08/04/2017   Tdap 09/06/2017    TDAP status: Up to date  Flu Vaccine status: Due, Education has been provided regarding the importance of this vaccine. Advised may receive this vaccine at local pharmacy or Health Dept. Aware to provide a copy of the vaccination  record if obtained from local pharmacy or Health Dept. Verbalized acceptance and understanding.  Pneumococcal vaccine status: Up to date  Covid-19 vaccine status:  Completed vaccines  Qualifies for Shingles Vaccine? Yes   Zostavax completed No   Shingrix Completed?: No.    Education has been provided regarding the importance of this vaccine. Patient has been advised to call insurance company to determine out of pocket expense if they have not yet received this vaccine. Advised may also receive vaccine at local pharmacy or Health Dept. Verbalized acceptance and understanding.  Screening Tests Health Maintenance  Topic Date Due   Pneumococcal Vaccine 72-45 Years old (1 - PCV) Never done   PAP SMEAR-Modifier  05/21/2011   Zoster Vaccines- Shingrix (1 of 2) Never done   COVID-19 Vaccine (4 - Booster for Pfizer series) 11/20/2020   MAMMOGRAM  02/08/2021 (Originally 07/21/2013)   COLONOSCOPY (Pts 45-66yrs Insurance coverage will need to be confirmed)  02/08/2021 (Originally 07/21/2008)   TETANUS/TDAP  09/07/2027   Hepatitis C Screening  Completed   HIV Screening  Completed   HPV VACCINES  Aged Out    Health Maintenance  Health Maintenance Due  Topic Date Due   Pneumococcal Vaccine 33-71 Years old (1 - PCV) Never done   PAP SMEAR-Modifier  05/21/2011   Zoster Vaccines- Shingrix (1 of 2) Never done   COVID-19 Vaccine (4 - Booster for Pfizer series) 11/20/2020    Colorectal cancer screening: Type of screening: Cologuard. Completed  . Repeat every 1-3 years  Mammogram status: Needs to be ordered.  Repeat every year    Lung Cancer Screening: (Low Dose CT Chest recommended if Age 3-80 years, 30 pack-year currently smoking OR have quit w/in 15years.) does qualify.   Lung Cancer Screening Referral: placed  Additional Screening:  Hepatitis C Screening: does qualify; Completed   Vision Screening: Recommended annual ophthalmology exams for early detection of glaucoma and other disorders of the eye. Is the patient up to date with their annual eye exam?  No  Who is the provider or what is the name of the office in which the patient attends annual eye  exams? Does not have one If pt is not established with a provider, would they like to be referred to a provider to establish care? Yes .   Dental Screening: Recommended annual dental exams for proper oral hygiene  Community Resource Referral / Chronic Care Management: CRR required this visit?  No   CCM required this visit?  No      Plan:     I have personally reviewed and noted the following in the patient's chart:   Medical and social history Use of alcohol, tobacco or illicit drugs  Current medications and supplements including opioid prescriptions.  Functional ability and status Nutritional status Physical activity Advanced directives List of other physicians Hospitalizations, surgeries, and ER visits in previous 12 months Vitals Screenings to include cognitive, depression, and falls Referrals and appointments  In addition, I have reviewed and discussed with patient certain preventive protocols, quality metrics, and best practice recommendations. A written personalized care plan for preventive services as well as general preventive health recommendations were provided to patient.     Fenton Foy, NP   12/26/2020

## 2021-01-06 ENCOUNTER — Other Ambulatory Visit: Payer: Self-pay | Admitting: Family Medicine

## 2021-01-06 DIAGNOSIS — I10 Essential (primary) hypertension: Secondary | ICD-10-CM

## 2021-01-06 MED ORDER — LOSARTAN POTASSIUM 25 MG PO TABS: ORAL_TABLET | ORAL | 3 refills | Status: DC

## 2021-01-29 ENCOUNTER — Telehealth (INDEPENDENT_AMBULATORY_CARE_PROVIDER_SITE_OTHER): Payer: Medicare HMO | Admitting: Nurse Practitioner

## 2021-01-29 DIAGNOSIS — U071 COVID-19: Secondary | ICD-10-CM | POA: Diagnosis not present

## 2021-01-29 MED ORDER — MOLNUPIRAVIR EUA 200MG CAPSULE: 4.0000 | ORAL_CAPSULE | Freq: Two times a day (BID) | ORAL | 0 refills | Status: AC

## 2021-01-29 NOTE — Progress Notes (Signed)
Virtual Visit via Telephone Note  I connected with Karen Ochoa on 01/30/21 at 10:00 AM EDT by telephone and verified that I am speaking with the correct person using two identifiers.  Location: Patient: home Provider: office   I discussed the limitations, risks, security and privacy concerns of performing an evaluation and management service by telephone and the availability of in person appointments. I also discussed with the patient that there may be a patient responsible charge related to this service. The patient expressed understanding and agreed to proceed.   History of Present Illness:  Patient presents today for post-COVID care clinic visit through televisit.  Symptoms started this past Saturday.  Was at a funeral last Thursday and several people that were at the funeral who tested positive for COVID.  Patient has been positive yesterday.  She also went and had her fourth booster yesterday even though she was symptomatic.  She denies any significant fever or significant shortness of breath. Denies f/c/s, n/v/d, hemoptysis, PND, chest pain or edema.      Observations/Objective:   Vitals with BMI 12/26/2020 10/29/2020 08/14/2020  Height 5\' 7"  5\' 7"  5\' 7"   Weight 178 lbs 179 lbs 184 lbs 13 oz  BMI 27.87 70.78 67.54  Systolic 492 010 071  Diastolic 74 76 82  Pulse 77 78 74      Assessment and Plan:  Covid 19 Cough:   Stay well hydrated  Stay active  Deep breathing exercises   May take tylenol for fever or pain    You are being prescribed MOLNUPIRAVIR for COVID-19 infection.     Please call the pharmacy or go through the drive through vs going inside if you are picking up the mediation yourself to prevent further spread. If prescribed to a [facility] affiliated pharmacy, a pharmacist will bring the medication out to your car.   ADMINISTRATION INSTRUCTIONS: Take with or without food. Swallow the tablets whole. Don't chew, crush, or break the medications  because it might not work as well  For each dose of the medication, you should be taking FOUR tablets at one time, TWICE a day   Finish your full five-day course of Molnupiravir even if you feel better before you're done. Stopping this medication too early can make it less effective to prevent severe illness related to [place].    Molnupiravir is prescribed for YOU ONLY. Don't share it with others, even if they have similar symptoms as you. This medication might not be right for everyone.   Make sure to take steps to protect yourself and others while you're taking this medication in order to get well soon and to prevent others from getting sick with COVID-19.     COMMON SIDE EFFECTS: Diarrhea Nausea  Dizziness    If your COVID-19 symptoms get worse, get medical help right away. Call 911 if you experience symptoms such as worsening cough, trouble breathing, chest pain that doesn't go away, confusion, a hard time staying awake, and pale or blue-colored skin. This medication won't prevent all COVID-19 cases from getting worse.      Follow up:  Follow up if needed      I discussed the assessment and treatment plan with the patient. The patient was provided an opportunity to ask questions and all were answered. The patient agreed with the plan and demonstrated an understanding of the instructions.   The patient was advised to call back or seek an in-person evaluation if the symptoms worsen or if the  condition fails to improve as anticipated.  I provided 23 minutes of non-face-to-face time during this encounter.   Fenton Foy, NP

## 2021-01-29 NOTE — Patient Instructions (Addendum)
Covid 19 Cough:   Stay well hydrated  Stay active  Deep breathing exercises   May take tylenol for fever or pain    You are being prescribed MOLNUPIRAVIR for COVID-19 infection.     Please call the pharmacy or go through the drive through vs going inside if you are picking up the mediation yourself to prevent further spread. If prescribed to a [facility] affiliated pharmacy, a pharmacist will bring the medication out to your car.   ADMINISTRATION INSTRUCTIONS: Take with or without food. Swallow the tablets whole. Don't chew, crush, or break the medications because it might not work as well  For each dose of the medication, you should be taking FOUR tablets at one time, TWICE a day   Finish your full five-day course of Molnupiravir even if you feel better before you're done. Stopping this medication too early can make it less effective to prevent severe illness related to [place].    Molnupiravir is prescribed for YOU ONLY. Don't share it with others, even if they have similar symptoms as you. This medication might not be right for everyone.   Make sure to take steps to protect yourself and others while you're taking this medication in order to get well soon and to prevent others from getting sick with COVID-19.     COMMON SIDE EFFECTS: Diarrhea Nausea  Dizziness    If your COVID-19 symptoms get worse, get medical help right away. Call 911 if you experience symptoms such as worsening cough, trouble breathing, chest pain that doesn't go away, confusion, a hard time staying awake, and pale or blue-colored skin. This medication won't prevent all COVID-19 cases from getting worse.      Follow up:  Follow up if needed

## 2021-02-04 ENCOUNTER — Ambulatory Visit: Payer: Medicare HMO | Admitting: Family Medicine

## 2021-02-04 ENCOUNTER — Other Ambulatory Visit: Payer: Self-pay

## 2021-02-04 DIAGNOSIS — K581 Irritable bowel syndrome with constipation: Secondary | ICD-10-CM

## 2021-02-04 MED ORDER — LINACLOTIDE 72 MCG PO CAPS: 72.0000 ug | ORAL_CAPSULE | Freq: Every day | ORAL | 1 refills | Status: DC

## 2021-04-01 ENCOUNTER — Other Ambulatory Visit: Payer: Self-pay

## 2021-04-01 ENCOUNTER — Encounter: Payer: Self-pay | Admitting: Family Medicine

## 2021-04-01 ENCOUNTER — Telehealth (INDEPENDENT_AMBULATORY_CARE_PROVIDER_SITE_OTHER): Payer: Medicaid Other | Admitting: Family Medicine

## 2021-04-01 ENCOUNTER — Telehealth: Payer: Self-pay | Admitting: Clinical

## 2021-04-01 ENCOUNTER — Ambulatory Visit: Payer: Medicare HMO | Admitting: Family Medicine

## 2021-04-01 DIAGNOSIS — R531 Weakness: Secondary | ICD-10-CM

## 2021-04-01 DIAGNOSIS — I10 Essential (primary) hypertension: Secondary | ICD-10-CM

## 2021-04-01 DIAGNOSIS — Z5900 Homelessness unspecified: Secondary | ICD-10-CM

## 2021-04-01 DIAGNOSIS — E785 Hyperlipidemia, unspecified: Secondary | ICD-10-CM

## 2021-04-01 DIAGNOSIS — Z7409 Other reduced mobility: Secondary | ICD-10-CM

## 2021-04-01 DIAGNOSIS — F17200 Nicotine dependence, unspecified, uncomplicated: Secondary | ICD-10-CM
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
## 2021-04-16 ENCOUNTER — Encounter: Payer: Self-pay | Admitting: Family Medicine

## 2021-05-09 ENCOUNTER — Ambulatory Visit: Payer: Medicaid Other | Admitting: Nurse Practitioner

## 2021-05-20 ENCOUNTER — Other Ambulatory Visit: Payer: Self-pay

## 2021-05-20 DIAGNOSIS — J302 Other seasonal allergic rhinitis: Secondary | ICD-10-CM

## 2021-05-20 MED ORDER — LEVOCETIRIZINE DIHYDROCHLORIDE 5 MG PO TABS: 5.0000 mg | ORAL_TABLET | Freq: Every evening | ORAL | 2 refills | Status: DC

## 2021-08-05 ENCOUNTER — Other Ambulatory Visit: Payer: Self-pay

## 2021-08-05 ENCOUNTER — Encounter: Payer: Self-pay | Admitting: Family Medicine

## 2021-08-05 ENCOUNTER — Ambulatory Visit (INDEPENDENT_AMBULATORY_CARE_PROVIDER_SITE_OTHER): Payer: Medicaid Other | Admitting: Family Medicine

## 2021-08-05 VITALS — BP 108/83 | HR 75 | Temp 97.6°F | Ht 67.0 in | Wt 174.6 lb

## 2021-08-05 DIAGNOSIS — N289 Disorder of kidney and ureter, unspecified: Secondary | ICD-10-CM

## 2021-08-05 DIAGNOSIS — F17200 Nicotine dependence, unspecified, uncomplicated: Secondary | ICD-10-CM

## 2021-08-05 DIAGNOSIS — R5383 Other fatigue: Secondary | ICD-10-CM | POA: Diagnosis not present

## 2021-08-05 DIAGNOSIS — Z8673 Personal history of transient ischemic attack (TIA), and cerebral infarction without residual deficits: Secondary | ICD-10-CM

## 2021-08-05 DIAGNOSIS — I10 Essential (primary) hypertension: Secondary | ICD-10-CM | POA: Diagnosis not present

## 2021-08-05 DIAGNOSIS — E785 Hyperlipidemia, unspecified: Secondary | ICD-10-CM | POA: Diagnosis not present

## 2021-08-05 DIAGNOSIS — R531 Weakness: Secondary | ICD-10-CM

## 2021-08-05 NOTE — Progress Notes (Signed)
Patient [facility]   Established Patient Office Visit  Subjective:  Patient ID: Karen Ochoa, female    DOB: 12-09-1965  Age: 56 y.o. MRN: [id number]  CC:  Chief Complaint  Patient presents with   Follow-up    Pt is here today for her follow up visit and to discuss her iron level being checked. Pt is requesting a rollator walker.    HPI Karen Ochoa is a very pleasant 56 year old female with a medical history significant for essential hypertension, coronary artery disease, history of chronic systolic CHF, tobacco dependence, and history of CVA presents for follow-up of chronic conditions. Patient states that she is mostly been doing well and is without complaint on today.  She continues to have left-sided weakness related to previous CVA.  Patient states that she was evaluated by her home care provider who suggested walker to assist with ambulation.  Patient has been utilizing a cane over the past several years. Patient has a history of hypertension.  She does not check blood pressures at home.  She has been taking amlodipine and losartan consistently.  She does not exercise nor follow a low-fat diet.  She denies any lower extremity swelling, chest pain, heart palpitations, or tachypnea.  Patient has a family history of heart disease without sudden death.  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   Fibroids    GERD (gastroesophageal reflux disease)    Headache    "weekly" (07/20/2017)   HLD (hyperlipidemia) 07/18/2017   Hx MRSA infection    Hypertension    Insomnia due to medical condition    Ischemic cardiomyopathy    Stroke ([place]) 07/18/2017   TIA (transient ischemic attack)    "I've had several"    Past Surgical History:  Procedure Laterality Date   CORONARY ARTERY BYPASS GRAFT N/A 10/07/2017   Procedure: CORONARY ARTERY BYPASS GRAFTING (CABG) ON PUMP TIMES 5 USING LEFT  INTERNAL MAMMARY ARTERY AND BILATERAL GREATER SAPHENOUS VEIN VIA ENDOHARDEST.;  Surgeon: Gaye Pollack, MD;  Location: [facility];  Service: Open Heart Surgery;  Laterality: N/A;   RIGHT/LEFT HEART CATH AND CORONARY ANGIOGRAPHY N/A 10/05/2017   Procedure: RIGHT/LEFT HEART CATH AND CORONARY ANGIOGRAPHY;  Surgeon: Belva Crome, MD;  Location: [facility];  Service: Cardiovascular;  Laterality: N/A;   TEE WITHOUT CARDIOVERSION N/A 10/07/2017   Procedure: TRANSESOPHAGEAL ECHOCARDIOGRAM (TEE);  Surgeon: Gaye Pollack, MD;  Location: [facility];  Service: Open Heart Surgery;  Laterality: N/A;   WISDOM TOOTH EXTRACTION      Family History  Problem Relation Age of Onset   Hypertension Mother    Diabetes Sister    Asthma Brother    Diabetes Brother    Anesthesia problems Neg Hx     Social History   Socioeconomic History   Marital status: Single    Spouse name: Not on file   Number of children: Not on file   Years of education: Not on file   Highest education level: Not on file  Occupational History   Not on file  Tobacco Use   Smoking status: Every Day    Packs/day: 0.50    Years: 10.00    Pack years: 5.00    Types: Cigarettes   Smokeless tobacco: Never   Tobacco comments:    2 to 3 per day  Vaping Use  Vaping Use: Never used  Substance and Sexual Activity   Alcohol use: No    Alcohol/week: 2.0 standard drinks    Types: 2 Glasses of wine per week    Comment: 08/04/2017--states no    Drug use: No   Sexual activity: Yes    Birth control/protection: None  Other Topics Concern   Not on file  Social History Narrative   Not on file   Social Determinants of Health   Financial Resource Strain: Low Risk    Difficulty of Paying Living Expenses: Not very hard  Food Insecurity: No Food Insecurity   Worried About Charity fundraiser in the Last Year: Never true   Ran Out of Food in the Last Year: Never true  Transportation Needs: No Transportation Needs   Lack of Transportation  (Medical): No   Lack of Transportation (Non-Medical): No  Physical Activity: Insufficiently Active   Days of Exercise per Week: 3 days   Minutes of Exercise per Session: 10 min  Stress: No Stress Concern Present   Feeling of Stress : Not at all  Social Connections: Socially Integrated   Frequency of Communication with Friends and Family: More than three times a week   Frequency of Social Gatherings with Friends and Family: Once a week   Attends Religious Services: More than 4 times per year   Active Member of [employer] or Organizations: Yes   Attends Music therapist: More than 4 times per year   Marital Status: Married  Human resources officer Violence: Not At Risk   Fear of Current or Ex-Partner: No   Emotionally Abused: No   Physically Abused: No   Sexually Abused: No    Outpatient Medications Prior to Visit  Medication Sig Dispense Refill   albuterol (VENTOLIN HFA) 108 (90 Base) MCG/ACT inhaler INHALE 2 PUFFS INTO THE LUNGS EVERY 6 HOURS AS NEEDED FOR WHEEZING OR SHORTNESS OF BREATH 8.5 g 3   amLODipine (NORVASC) 10 MG tablet Take 1 tablet (10 mg total) by mouth daily. 90 tablet 3   atorvastatin (LIPITOR) 80 MG tablet TAKE 1 TABLET BY MOUTH EVERY DAY AT 6 PM. 90 tablet 01   carvedilol (COREG) 25 MG tablet TAKE 1 TABLET(25 MG) BY MOUTH TWICE DAILY WITH A MEAL 60 tablet 2   famotidine (PEPCID) 20 MG tablet Take 1 tablet (20 mg total) by mouth 2 (two) times daily. 180 tablet 3   levocetirizine (XYZAL) 5 MG tablet Take 1 tablet (5 mg total) by mouth every evening. 90 tablet 2   linaclotide (LINZESS) 72 MCG capsule Take 1 capsule (72 mcg total) by mouth daily before breakfast. 90 capsule 1   losartan (COZAAR) 25 MG tablet TAKE 1 TABLET(25 MG) BY MOUTH DAILY 90 tablet 3   traZODone (DESYREL) 150 MG tablet Take 1 tablet (150 mg total) by mouth at bedtime as needed for sleep. 90 tablet 2   aspirin EC 81 MG tablet Take 1 tablet (81 mg total) by mouth daily. Swallow whole. (Patient not  taking: Reported on 04/01/2021) 90 tablet 3   No facility-administered medications prior to visit.    Allergies  Allergen Reactions   Penicillins Anaphylaxis and Other (See Comments)    Has patient had a PCN reaction causing immediate rash, facial/tongue/throat swelling, SOB or lightheadedness with hypotension: Yes Has patient had a PCN reaction causing severe rash involving mucus membranes or skin necrosis: Yes Has patient had a PCN reaction that required hospitalization Yes Has patient had a PCN reaction occurring within  the last 10 years: No If all of the above answers are "NO", then may proceed with Cephalosporin use.    Sulfonamide Derivatives Anaphylaxis   Clonidine Derivatives Other (See Comments)    Patient passed out shortly after ingesting this at a doctor's office on 05/08/16 (was taken in conjunction with a tablet of Labetalol.)   Flagyl [Metronidazole] Swelling and Other (See Comments)    Skin blisters   Labetalol Other (See Comments)    Patient passed out shortly after ingesting this at a doctor's office on 05/08/16 (was taken in conjunction with a tablet of Clonidine.)    ROS Review of Systems  Constitutional: Negative.   HENT: Negative.    Eyes: Negative.   Respiratory: Negative.  Negative for shortness of breath.   Cardiovascular: Negative.   Gastrointestinal: Negative.   Endocrine: Negative.   Genitourinary: Negative.   Musculoskeletal: Negative.   Neurological: Negative.   Psychiatric/Behavioral: Negative.       Objective:    Physical Exam Constitutional:      Appearance: Normal appearance.  Eyes:     Pupils: Pupils are equal, round, and reactive to light.  Cardiovascular:     Rate and Rhythm: Normal rate and regular rhythm.     Pulses: Normal pulses.  Pulmonary:     Effort: Pulmonary effort is normal.  Abdominal:     General: Bowel sounds are normal.  Skin:    General: Skin is warm.  Neurological:     General: No focal deficit present.      Mental Status: She is alert. Mental status is at baseline.  Psychiatric:        Mood and Affect: Mood normal.        Behavior: Behavior normal.        Thought Content: Thought content normal.        Judgment: Judgment normal.    BP 108/83    Pulse 75    Temp 97.6 F (36.4 C)    Ht '5\' 7"'  (1.702 m)    Wt 174 lb 9.6 oz (79.2 kg)    LMP 05/19/2013    SpO2 99%    BMI 27.35 kg/m  Wt Readings from Last 3 Encounters:  08/05/21 174 lb 9.6 oz (79.2 kg)  12/26/20 178 lb (80.7 kg)  10/29/20 179 lb (81.2 kg)     Health Maintenance Due  Topic Date Due   COLONOSCOPY (Pts 45-52yr Insurance coverage will need to be confirmed)  Never done   PAP SMEAR-Modifier  05/21/2011   MAMMOGRAM  Never done   Zoster Vaccines- Shingrix (1 of 2) Never done   Pneumococcal Vaccine [id number] old (2 - PCV) 08/04/2018   COVID-19 Vaccine (4 - Booster for Pfizer series) 09/17/2020    There are no preventive care reminders to display for this patient.  Lab Results  Component Value Date   TSH 0.856 07/20/2017   Lab Results  Component Value Date   WBC 7.6 12/08/2017   HGB 11.7 12/08/2017   HCT 35.3 12/08/2017   MCV 83 12/08/2017   PLT 257 12/08/2017   Lab Results  Component Value Date   NA 138 10/29/2020   K 3.7 10/29/2020   CO2 23 10/29/2020   GLUCOSE 97 10/29/2020   BUN 10 10/29/2020   CREATININE 1.09 (H) 10/29/2020   BILITOT 0.6 07/30/2020   ALKPHOS 117 07/30/2020   AST 47 (H) 07/30/2020   ALT 28 07/30/2020   PROT 6.9 07/30/2020   ALBUMIN 4.2 07/30/2020   CALCIUM  9.0 10/29/2020   ANIONGAP 10 10/14/2017   EGFR 59 (L) 10/29/2020   Lab Results  Component Value Date   CHOL 129 07/30/2020   Lab Results  Component Value Date   HDL 39 (L) 07/30/2020   Lab Results  Component Value Date   LDLCALC 66 07/30/2020   Lab Results  Component Value Date   TRIG 137 07/30/2020   Lab Results  Component Value Date   CHOLHDL 3.3 07/30/2020   Lab Results  Component Value Date   HGBA1C 5.8 (H)  10/06/2017      Assessment & Plan:   Problem List Items Addressed This Visit       Genitourinary   Renal insufficiency     Other   Hyperlipidemia with target LDL less than 70   History of CVA (cerebrovascular accident)   Other Visit Diagnoses     Essential hypertension    -  Primary   Tobacco dependence       Left-sided weakness       Relevant Orders   For home use only DME 4 wheeled rolling walker with seat      1. Essential hypertension BP 108/83    Pulse 75    Temp 97.6 F (36.4 C)    Ht '5\' 7"'  (1.702 m)    Wt 174 lb 9.6 oz (79.2 kg)    LMP 05/19/2013    SpO2 99%    BMI 27.35 kg/m  - Continue medication, monitor blood pressure at home. Continue DASH diet.  Reminder to go to the ER if any CP, SOB, nausea, dizziness, severe HA, changes vision/speech, left arm numbness and tingling and jaw pain.   - Basic Metabolic Panel  2. Hyperlipidemia with target LDL less than 70  - Lipid Panel  3. Renal insufficiency -Basic metabolic panel  4. Tobacco dependence Smoking cessation instruction/counseling given:  counseled patient on the dangers of tobacco use, advised patient to stop smoking, and reviewed strategies to maximize success   5. Left-sided weakness  - For home use only DME 4 wheeled rolling walker with seat  6. History of CVA (cerebrovascular accident) Discussed the importance of baby aspirin daily and will continue atorvastatin 80 mg with dinner.  7. Other fatigue  - Anemia panel     Follow-up: Return in about 6 months (around 02/02/2022).    Donia Pounds  APRN, MSN, FNP-C Patient [facility] [address] [place], [place] [address] [phone]

## 2021-08-06 ENCOUNTER — Encounter: Payer: Self-pay | Admitting: Family Medicine

## 2021-08-06 LAB — BASIC METABOLIC PANEL
BUN/Creatinine Ratio: 13 (ref 9–23)
BUN: 14 mg/dL (ref 6–24)
CO2: 22 mmol/L (ref 20–29)
Calcium: 9.4 mg/dL (ref 8.7–10.2)
Chloride: 106 mmol/L (ref 96–106)
Creatinine, Ser: 1.08 mg/dL — ABNORMAL HIGH (ref 0.57–1.00)
Glucose: 101 mg/dL — ABNORMAL HIGH (ref 70–99)
Potassium: 4.1 mmol/L (ref 3.5–5.2)
Sodium: 143 mmol/L (ref 134–144)
eGFR: 60 mL/min/{1.73_m2} (ref >59)

## 2021-08-06 LAB — LIPID PANEL
Chol/HDL Ratio: 4 ratio (ref 0.0–4.4)
Cholesterol, Total: 177 mg/dL (ref 100–199)
HDL: 44 mg/dL (ref >39)
LDL Chol Calc (NIH): 106 mg/dL — ABNORMAL HIGH (ref 0–99)
Triglycerides: 151 mg/dL — ABNORMAL HIGH (ref 0–149)
VLDL Cholesterol Cal: 27 mg/dL (ref 5–40)

## 2021-08-06 LAB — ANEMIA PANEL
Ferritin: 92 ng/mL (ref 15–150)
Hematocrit: 41 % (ref 34.0–46.6)
Iron Saturation: 34 % (ref 15–55)
Iron: 137 ug/dL (ref 27–159)
Retic Ct Pct: 1.6 % (ref 0.6–2.6)
Total Iron Binding Capacity: 404 ug/dL (ref 250–450)
UIBC: 267 ug/dL (ref 131–425)
Vitamin B-12: 232 pg/mL (ref 232–1245)

## 2021-08-06 NOTE — Progress Notes (Unsigned)
Karen Ochoa is a 56 year old female with a medical history significant for hyperlipidemia, essential hypertension, and history of CVA that presented for follow-up of chronic conditions. All laboratory values were reviewed.  Triglycerides slightly elevated at 151 and LDL elevated at 106.  We will continue Lipitor 80 mg with dinner and daily aspirin.  Also, recommend a low-fat, low-sodium diet divided over small meals throughout the day.   Patient will follow-up in 3 months as scheduled.   Donia Pounds  APRN, MSN, FNP-C Patient [facility] [address] [place], [place] [address] [phone]

## 2021-08-12 ENCOUNTER — Other Ambulatory Visit: Payer: Self-pay

## 2021-08-12 ENCOUNTER — Telehealth: Payer: Self-pay

## 2021-08-12 DIAGNOSIS — I10 Essential (primary) hypertension: Secondary | ICD-10-CM

## 2021-08-12 DIAGNOSIS — G4700 Insomnia, unspecified: Secondary | ICD-10-CM

## 2021-08-12 DIAGNOSIS — K581 Irritable bowel syndrome with constipation: Secondary | ICD-10-CM

## 2021-08-12 MED ORDER — TRAZODONE HCL 150 MG PO TABS: 150.0000 mg | ORAL_TABLET | Freq: Every evening | ORAL | 2 refills | Status: DC | PRN

## 2021-08-12 MED ORDER — AMLODIPINE BESYLATE 10 MG PO TABS: 10.0000 mg | ORAL_TABLET | Freq: Every day | ORAL | 1 refills | Status: DC

## 2021-08-12 MED ORDER — LINACLOTIDE 72 MCG PO CAPS: 72.0000 ug | ORAL_CAPSULE | Freq: Every day | ORAL | 1 refills | Status: DC

## 2021-08-12 NOTE — Telephone Encounter (Signed)
[facility] [employer] thru [place]  Fax [phone]- [phone]

## 2021-08-12 NOTE — Telephone Encounter (Signed)
Amlodipine refills were sent to the pharmacy. The other two medication refills have to be address by the provider.

## 2021-08-19 ENCOUNTER — Other Ambulatory Visit: Payer: Self-pay

## 2021-08-19 DIAGNOSIS — G4700 Insomnia, unspecified: Secondary | ICD-10-CM

## 2021-08-19 MED ORDER — TRAZODONE HCL 150 MG PO TABS: 150.0000 mg | ORAL_TABLET | Freq: Every evening | ORAL | 2 refills | Status: DC | PRN

## 2021-09-04 ENCOUNTER — Encounter: Payer: Self-pay | Admitting: Cardiovascular Disease

## 2021-09-04 NOTE — Progress Notes (Signed)
Cardiology Office Note:    Date:  09/05/2021   ID:  Karen Ochoa, DOB 10/09/1965, MRN [id number]  PCP:  [name], FNP  Cardiologist:  Mertie Moores, MD  Electrophysiologist:  None   Referring MD: [name], FNP   Chief Complaint  Patient presents with   Coronary Artery Disease   Hypertension          Dec. 10, 2019    Karen Ochoa is a 56 y.o. female with a hx of  CAD and CABG.  I met her in the hospital this past year   She had a CVA on Christmas of 2018.  She had coronary artery bypass grafting in March, 2019. Still has a lot of residual left-sided weakness from her stroke. Living at home.  She denies having any angina pain.  She does have some musculoskeletal soreness when it rains.  Dec. 7, 2020  Karen Ochoa is seen today for follow up visit  Hx of CAD and CABG   No exercise, Wants to get a treadmill .  No cp or dyspnea Still smoking   Feb. 17, 2023 Karen Ochoa is seen for follow up of CAD / CABG  Has had a stroke shortly before her CABG  ,  now uses a cane, She only takes her atorvastatin on an intermittent basis.  The tablets are so large and since she is had a stroke she has difficulty swallowing.  I suggested that we try her on rosuvastatin which is a much smaller tablet.  We will stop the atorvastatin and put her on rosuvastatin 10 mg a day.  We will check labs in 3 months.    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   Fibroids    GERD (gastroesophageal reflux disease)    Headache    "weekly" (07/20/2017)   HLD (hyperlipidemia) 07/18/2017   Hx MRSA infection    Hypertension    Insomnia due to medical condition    Ischemic cardiomyopathy    Stroke ([place]) 07/18/2017   TIA (transient ischemic attack)    "I've had several"    Past Surgical History:  Procedure Laterality Date   CORONARY ARTERY BYPASS GRAFT N/A 10/07/2017   Procedure: CORONARY ARTERY BYPASS GRAFTING (CABG) ON PUMP  TIMES 5 USING LEFT INTERNAL MAMMARY ARTERY AND BILATERAL GREATER SAPHENOUS VEIN VIA ENDOHARDEST.;  Surgeon: Gaye Pollack, MD;  Location: [facility];  Service: Open Heart Surgery;  Laterality: N/A;   RIGHT/LEFT HEART CATH AND CORONARY ANGIOGRAPHY N/A 10/05/2017   Procedure: RIGHT/LEFT HEART CATH AND CORONARY ANGIOGRAPHY;  Surgeon: Belva Crome, MD;  Location: [facility];  Service: Cardiovascular;  Laterality: N/A;   TEE WITHOUT CARDIOVERSION N/A 10/07/2017   Procedure: TRANSESOPHAGEAL ECHOCARDIOGRAM (TEE);  Surgeon: Gaye Pollack, MD;  Location: [facility];  Service: Open Heart Surgery;  Laterality: N/A;   WISDOM TOOTH EXTRACTION      Current Medications: Current Meds  Medication Sig   albuterol (VENTOLIN HFA) 108 (90 Base) MCG/ACT inhaler INHALE 2 PUFFS INTO THE LUNGS EVERY 6 HOURS AS NEEDED FOR WHEEZING OR SHORTNESS OF BREATH   amLODipine (NORVASC) 10 MG tablet Take 1 tablet (10 mg total) by mouth daily.   carvedilol (COREG) 25 MG tablet TAKE 1 TABLET(25 MG) BY MOUTH TWICE DAILY WITH A MEAL   famotidine (PEPCID) 20 MG tablet Take 1 tablet (20 mg total) by mouth 2 (two) times  daily.   levocetirizine (XYZAL) 5 MG tablet Take 1 tablet (5 mg total) by mouth every evening.   linaclotide (LINZESS) 72 MCG capsule Take 1 capsule (72 mcg total) by mouth daily before breakfast.   losartan (COZAAR) 25 MG tablet TAKE 1 TABLET(25 MG) BY MOUTH DAILY   rosuvastatin (CRESTOR) 10 MG tablet Take 1 tablet (10 mg total) by mouth daily.   traZODone (DESYREL) 150 MG tablet Take 1 tablet (150 mg total) by mouth at bedtime as needed for sleep.   [DISCONTINUED] atorvastatin (LIPITOR) 80 MG tablet TAKE 1 TABLET BY MOUTH EVERY DAY AT 6 PM.     Allergies:   Penicillins, Sulfonamide derivatives, Clonidine derivatives, Flagyl [metronidazole], and Labetalol   Social History   Socioeconomic History   Marital status: Single    Spouse name: Not on file   Number of children: Not on file   Years of education: Not on  file   Highest education level: Not on file  Occupational History   Not on file  Tobacco Use   Smoking status: Every Day    Packs/day: 0.50    Years: 10.00    Pack years: 5.00    Types: Cigarettes   Smokeless tobacco: Never   Tobacco comments:    2 to 3 per day  Vaping Use   Vaping Use: Never used  Substance and Sexual Activity   Alcohol use: No    Alcohol/week: 2.0 standard drinks    Types: 2 Glasses of wine per week    Comment: 08/04/2017--states no    Drug use: No   Sexual activity: Yes    Birth control/protection: None  Other Topics Concern   Not on file  Social History Narrative   Not on file   Social Determinants of Health   Financial Resource Strain: Low Risk    Difficulty of Paying Living Expenses: Not very hard  Food Insecurity: No Food Insecurity   Worried About Charity fundraiser in the Last Year: Never true   Ran Out of Food in the Last Year: Never true  Transportation Needs: No Transportation Needs   Lack of Transportation (Medical): No   Lack of Transportation (Non-Medical): No  Physical Activity: Insufficiently Active   Days of Exercise per Week: 3 days   Minutes of Exercise per Session: 10 min  Stress: No Stress Concern Present   Feeling of Stress : Not at all  Social Connections: Socially Integrated   Frequency of Communication with Friends and Family: More than three times a week   Frequency of Social Gatherings with Friends and Family: Once a week   Attends Religious Services: More than 4 times per year   Active Member of [employer] or Organizations: Yes   Attends Music therapist: More than 4 times per year   Marital Status: Married     Family History: The patient's family history includes Asthma in her brother; Diabetes in her brother and sister; Hypertension in her mother. There is no history of Anesthesia problems.  ROS:   Please see the history of present illness.     All other systems reviewed and are  negative.  EKGs/Labs/Other Studies Reviewed:    The following studies were reviewed today:   EKG:    Feb. 17, 2023:   NSR no ST or T wave with no ST or T wave changes   Recent Labs: 08/05/2021: BUN 14; Creatinine, Ser 1.08; Potassium 4.1; Sodium 143  Recent Lipid Panel    Component Value Date/Time  CHOL 177 08/05/2021 1238   TRIG 151 (H) 08/05/2021 1238   HDL 44 08/05/2021 1238   CHOLHDL 4.0 08/05/2021 1238   CHOLHDL 4.7 07/19/2017 0556   VLDL 42 (H) 07/19/2017 0556   LDLCALC 106 (H) 08/05/2021 1238    Physical Exam:    VS:  BP 110/68 (BP Location: Left Arm, Patient Position: Sitting, Cuff Size: Normal)    Pulse 80    Ht '5\' 7"'  (1.702 m)    Wt 181 lb (82.1 kg)    LMP 05/19/2013    SpO2 98%    BMI 28.35 kg/m     Wt Readings from Last 3 Encounters:  09/05/21 181 lb (82.1 kg)  08/05/21 174 lb 9.6 oz (79.2 kg)  12/26/20 178 lb (80.7 kg)    Physical Exam: Blood pressure 110/68, pulse 80, height '5\' 7"'  (1.702 m), weight 181 lb (82.1 kg), last menstrual period 05/19/2013, SpO2 98 %.  GEN:  Well nourished, well developed in no acute distress HEENT: Normal NECK: No JVD; No carotid bruits LYMPHATICS: No lymphadenopathy CARDIAC: RRR , no murmurs, rubs, gallops RESPIRATORY:  Clear to auscultation without rales, wheezing or rhonchi  ABDOMEN: Soft, non-tender, non-distended MUSCULOSKELETAL:  No edema; No deformity  SKIN: Warm and dry NEUROLOGIC:  Alert and oriented x 3    ASSESSMENT:    1. Coronary artery disease involving coronary bypass graft of native heart with unstable angina pectoris ([place])   2. Hyperlipidemia LDL goal <70     PLAN:       1.  Coronary artery disease:   no angina  She still smokes.   Encouraged her to stop   2.  History of stroke:     3.  Hyperlipidemia: She does not take her atorvastatin on a regular basis.  She has had a stroke and so swallowing the large tablet is difficult for her at times.  We will stop the atorvastatin and put her on  rosuvastatin 10 mg tablets.  This is a much smaller tablet.  We will check lab lipids, ALT, basic metabolic profile in 3 months.   Medication Adjustments/Labs and Tests Ordered: Current medicines are reviewed at length with the patient today.  Concerns regarding medicines are outlined above.  Orders Placed This Encounter  Procedures   Lipid panel   ALT   Basic metabolic panel   EKG 48-GQBV   Meds ordered this encounter  Medications   rosuvastatin (CRESTOR) 10 MG tablet    Sig: Take 1 tablet (10 mg total) by mouth daily.    Dispense:  90 tablet    Refill:  3      Patient Instructions  Medication Instructions:  Your physician has recommended you make the following change in your medication:  1) STOP Atorvastatin 2) START Rosuvastatin 72m once daily  *If you need a refill on your cardiac medications before your next appointment, please call your pharmacy*   Lab Work: In 3 months: Lipids, ALT, BMP If you have labs (blood work) drawn today and your tests are completely normal, you will receive your results only by: [place](if you have MyChart) OR A paper copy in the mail If you have any lab test that is abnormal or we need to change your treatment, we will call you to review the results.   Testing/Procedures: None   Follow-Up: [facility] and your [facility] needs are our [facility].  As part of our continuing mission to provide you with exceptional heart care, we have created  designated Provider Care Teams.  These Care Teams include your primary Cardiologist (physician) and Advanced Practice Providers (APPs -  Physician Assistants and Nurse Practitioners) who all work together to provide you with the care you need, when you need it.  We recommend signing up for the patient portal called "MyChart".  Sign up information is provided on this After Visit Summary.  MyChart is used to connect with patients for Virtual Visits (Telemedicine).  Patients are able to view  lab/test results, encounter notes, upcoming appointments, etc.  Non-urgent messages can be sent to your provider as well.   To learn more about what you can do with MyChart, go to [email or web address].    Your next appointment:   1 year(s)  The format for your next appointment:   In Person  Provider:   Mertie Moores, MD, or PA or NP    Signed, Mertie Moores, MD  09/05/2021 1:21 PM    [place]

## 2021-09-05 ENCOUNTER — Ambulatory Visit (INDEPENDENT_AMBULATORY_CARE_PROVIDER_SITE_OTHER): Payer: Medicare Other | Admitting: Cardiovascular Disease

## 2021-09-05 ENCOUNTER — Encounter: Payer: Self-pay | Admitting: Cardiovascular Disease

## 2021-09-05 ENCOUNTER — Other Ambulatory Visit: Payer: Self-pay

## 2021-09-05 VITALS — BP 110/68 | HR 80 | Ht 67.0 in | Wt 181.0 lb

## 2021-09-05 DIAGNOSIS — I25700 Atherosclerosis of coronary artery bypass graft(s), unspecified, with unstable angina pectoris: Secondary | ICD-10-CM

## 2021-09-05 DIAGNOSIS — E785 Hyperlipidemia, unspecified: Secondary | ICD-10-CM | POA: Diagnosis not present

## 2021-09-05 MED ORDER — ROSUVASTATIN CALCIUM 10 MG PO TABS: 10.0000 mg | ORAL_TABLET | Freq: Every day | ORAL | 3 refills | Status: DC

## 2021-09-05 NOTE — Patient Instructions (Signed)
Medication Instructions:  Your physician has recommended you make the following change in your medication:  1) STOP Atorvastatin 2) START Rosuvastatin 10mg  once daily  *If you need a refill on your cardiac medications before your next appointment, please call your pharmacy*   Lab Work: In 3 months: Lipids, ALT, BMP If you have labs (blood work) drawn today and your tests are completely normal, you will receive your results only by: [facility] (if you have MyChart) OR A paper copy in the mail If you have any lab test that is abnormal or we need to change your treatment, we will call you to review the results.   Testing/Procedures: None   Follow-Up: At [facility], you and your health needs are our priority.  As part of our continuing mission to provide you with exceptional heart care, we have created designated Provider Care Teams.  These Care Teams include your primary Cardiologist (physician) and Advanced Practice Providers (APPs -  Physician Assistants and Nurse Practitioners) who all work together to provide you with the care you need, when you need it.  We recommend signing up for the patient portal called "MyChart".  Sign up information is provided on this After Visit Summary.  MyChart is used to connect with patients for Virtual Visits (Telemedicine).  Patients are able to view lab/test results, encounter notes, upcoming appointments, etc.  Non-urgent messages can be sent to your provider as well.   To learn more about what you can do with MyChart, go to [email or web address].    Your next appointment:   1 year(s)  The format for your next appointment:   In Person  Provider:   Mertie Moores, MD, or PA or NP

## 2021-09-25 ENCOUNTER — Telehealth: Payer: Self-pay

## 2021-09-25 NOTE — Telephone Encounter (Addendum)
Patient contacted to schedule mammogram.  ? ?RE: Mobile Mammo event located at: ? ?[email or web address]  [facility]  ?      ?[address]    ?[place] [address]    ? ?Date: April 7th at 4:20pm ? ?

## 2021-10-17 ENCOUNTER — Other Ambulatory Visit: Payer: Self-pay | Admitting: Family Medicine

## 2021-10-17 DIAGNOSIS — Z139 Encounter for screening, unspecified: Secondary | ICD-10-CM

## 2021-10-23 ENCOUNTER — Other Ambulatory Visit: Payer: Self-pay | Admitting: Family Medicine

## 2021-10-23 DIAGNOSIS — I10 Essential (primary) hypertension: Secondary | ICD-10-CM

## 2021-10-23 NOTE — Telephone Encounter (Signed)
Patient requested to cancel the appt.  ?

## 2021-10-24 ENCOUNTER — Other Ambulatory Visit: Payer: Self-pay | Admitting: Family Medicine

## 2021-10-24 DIAGNOSIS — I10 Essential (primary) hypertension: Secondary | ICD-10-CM

## 2021-10-24 DIAGNOSIS — I25118 Atherosclerotic heart disease of native coronary artery with other forms of angina pectoris: Secondary | ICD-10-CM

## 2021-10-27 ENCOUNTER — Encounter: Payer: Self-pay | Admitting: Cardiovascular Disease

## 2021-11-04 ENCOUNTER — Encounter: Payer: Medicaid Other | Admitting: Family Medicine

## 2021-11-05 ENCOUNTER — Ambulatory Visit (INDEPENDENT_AMBULATORY_CARE_PROVIDER_SITE_OTHER): Payer: Medicare Other | Admitting: Nurse Practitioner

## 2021-11-05 ENCOUNTER — Encounter: Payer: Self-pay | Admitting: Nurse Practitioner

## 2021-11-05 VITALS — BP 117/88 | HR 78 | Temp 98.0°F | Ht 67.0 in | Wt 175.4 lb

## 2021-11-05 DIAGNOSIS — Z01419 Encounter for gynecological examination (general) (routine) without abnormal findings: Secondary | ICD-10-CM

## 2021-11-05 DIAGNOSIS — Z113 Encounter for screening for infections with a predominantly sexual mode of transmission: Secondary | ICD-10-CM | POA: Diagnosis not present

## 2021-11-05 LAB — POCT URINALYSIS DIP (CLINITEK)
Bilirubin, UA: NEGATIVE
Blood, UA: NEGATIVE
Glucose, UA: NEGATIVE mg/dL
Ketones, POC UA: NEGATIVE mg/dL
Leukocytes, UA: NEGATIVE
Nitrite, UA: NEGATIVE
Spec Grav, UA: 1.01 (ref 1.010–1.025)
Urobilinogen, UA: 1 E.U./dL
pH, UA: 5.5 (ref 5.0–8.0)

## 2021-11-05 NOTE — Assessment & Plan Note (Signed)
-   NuSwab Vaginitis Plus (VG+) ? ?2. Encounter for gynecological examination without abnormal finding ? ?- Cytology - PAP([place]) ? ?3. Well woman exam with routine gynecological exam ? ? ?Follow up: ? ?Follow up in 3 months ?

## 2021-11-05 NOTE — Patient Instructions (Addendum)
1. Screening examination for STD (sexually transmitted disease) ? ?- NuSwab Vaginitis Plus (VG+) ? ?2. Encounter for gynecological examination without abnormal finding ? ?- Cytology - PAP([place]) ? ?3. Well woman exam with routine gynecological exam ? ? ?Follow up: ? ?Follow up in 3 months ? ? ?Health Maintenance, Female ?Adopting a healthy lifestyle and getting preventive care are important in promoting health and wellness. Ask your health care provider about: ?The right schedule for you to have regular tests and exams. ?Things you can do on your own to prevent diseases and keep yourself healthy. ?What should I know about diet, weight, and exercise? ?Eat a healthy diet ? ?Eat a diet that includes plenty of vegetables, fruits, low-fat dairy products, and lean protein. ?Do not eat a lot of foods that are high in solid fats, added sugars, or sodium. ?Maintain a healthy weight ?Body mass index (BMI) is used to identify weight problems. It estimates body fat based on height and weight. Your health care provider can help determine your BMI and help you achieve or maintain a healthy weight. ?Get regular exercise ?Get regular exercise. This is one of the most important things you can do for your health. Most adults should: ?Exercise for at least 150 minutes each week. The exercise should increase your heart rate and make you sweat (moderate-intensity exercise). ?Do strengthening exercises at least twice a week. This is in addition to the moderate-intensity exercise. ?Spend less time sitting. Even light physical activity can be beneficial. ?Watch cholesterol and blood lipids ?Have your blood tested for lipids and cholesterol at 58 years of age, then have this test every 5 years. ?Have your cholesterol levels checked more often if: ?Your lipid or cholesterol levels are high. ?You are older than 58 years of age. ?You are at high risk for heart disease. ?What should I know about cancer screening? ?Depending on your health  history and family history, you may need to have cancer screening at various ages. This may include screening for: ?Breast cancer. ?Cervical cancer. ?Colorectal cancer. ?Skin cancer. ?Lung cancer. ?What should I know about heart disease, diabetes, and high blood pressure? ?Blood pressure and heart disease ?High blood pressure causes heart disease and increases the risk of stroke. This is more likely to develop in people who have high blood pressure readings or are overweight. ?Have your blood pressure checked: ?Every 3-5 years if you are 20-69 years of age. ?Every year if you are 19 years old or older. ?Diabetes ?Have regular diabetes screenings. This checks your fasting blood sugar level. Have the screening done: ?Once every three years after age 47 if you are at a normal weight and have a low risk for diabetes. ?More often and at a younger age if you are overweight or have a high risk for diabetes. ?What should I know about preventing infection? ?Hepatitis B ?If you have a higher risk for hepatitis B, you should be screened for this virus. Talk with your health care provider to find out if you are at risk for hepatitis B infection. ?Hepatitis C ?Testing is recommended for: ?Everyone born from [birth year] through 1967. ?Anyone with known risk factors for hepatitis C. ?Sexually transmitted infections (STIs) ?Get screened for STIs, including gonorrhea and chlamydia, if: ?You are sexually active and are younger than 58 years of age. ?You are older than 58 years of age and your health care provider tells you that you are at risk for this type of infection. ?Your sexual activity has changed since you  were last screened, and you are at increased risk for chlamydia or gonorrhea. Ask your health care provider if you are at risk. ?Ask your health care provider about whether you are at high risk for HIV. Your health care provider may recommend a prescription medicine to help prevent HIV infection. If you choose to take medicine to  prevent HIV, you should first get tested for HIV. You should then be tested every 3 months for as long as you are taking the medicine. ?Pregnancy ?If you are about to stop having your period (premenopausal) and you may become pregnant, seek counseling before you get pregnant. ?Take 400 to 800 micrograms (mcg) of folic acid every day if you become pregnant. ?Ask for birth control (contraception) if you want to prevent pregnancy. ?Osteoporosis and menopause ?Osteoporosis is a disease in which the bones lose minerals and strength with aging. This can result in bone fractures. If you are 49 years old or older, or if you are at risk for osteoporosis and fractures, ask your health care provider if you should: ?Be screened for bone loss. ?Take a calcium or vitamin D supplement to lower your risk of fractures. ?Be given hormone replacement therapy (HRT) to treat symptoms of menopause. ?Follow these instructions at home: ?Alcohol use ?Do not drink alcohol if: ?Your health care provider tells you not to drink. ?You are pregnant, may be pregnant, or are planning to become pregnant. ?If you drink alcohol: ?Limit how much you have to: ?0-1 drink a day. ?Know how much alcohol is in your drink. In the [place] one drink equals one 12 oz bottle of beer (355 mL), one 5 oz glass of wine (148 mL), or one 1? oz glass of hard liquor (44 mL). ?Lifestyle ?Do not use any products that contain nicotine or tobacco. These products include cigarettes, chewing tobacco, and vaping devices, such as e-cigarettes. If you need help quitting, ask your health care provider. ?Do not use street drugs. ?Do not share needles. ?Ask your health care provider for help if you need support or information about quitting drugs. ?General instructions ?Schedule regular health, dental, and eye exams. ?Stay current with your vaccines. ?Tell your health care provider if: ?You often feel depressed. ?You have ever been abused or do not feel safe at  home. ?Summary ?Adopting a healthy lifestyle and getting preventive care are important in promoting health and wellness. ?Follow your health care provider's instructions about healthy diet, exercising, and getting tested or screened for diseases. ?Follow your health care provider's instructions on monitoring your cholesterol and blood pressure. ?This information is not intended to replace advice given to you by your health care provider. Make sure you discuss any questions you have with your health care provider. ?Document Revised: 11/25/2020 Document Reviewed: 11/25/2020 ?Elsevier Patient Education ? [place]. ? ?

## 2021-11-05 NOTE — Progress Notes (Signed)
$'@Patient'x$  ID: Karen Ochoa, female    DOB: 1966/02/17, 56 y.o.   MRN: [id number] ? ?Chief Complaint  ?Patient presents with  ? Follow-up  ?  Pt is here for 3 month follow up and pap smear. No issues or concerns  ? ? ?Referring provider: ?[name], FNP ? ? ? ?HPI ? ?Patient presents for pap. She states that it has been 10 years since her last pap. No new issues or complaints. Would like STD screening. Patient states that overall she has been doing well. Denies f/c/s, n/v/d, hemoptysis, PND, chest pain or edema. ? ? ? ? ? ?Allergies  ?Allergen Reactions  ? Penicillins Anaphylaxis and Other (See Comments)  ?  Has patient had a PCN reaction causing immediate rash, facial/tongue/throat swelling, SOB or lightheadedness with hypotension: Yes ?Has patient had a PCN reaction causing severe rash involving mucus membranes or skin necrosis: Yes ?Has patient had a PCN reaction that required hospitalization Yes ?Has patient had a PCN reaction occurring within the last 10 years: No ?If all of the above answers are "NO", then may proceed with Cephalosporin use. ?  ? Sulfonamide Derivatives Anaphylaxis  ? Clonidine Derivatives Other (See Comments)  ?  Patient passed out shortly after ingesting this at a doctor's office on 05/08/16 (was taken in conjunction with a tablet of Labetalol.)  ? Flagyl [Metronidazole] Swelling and Other (See Comments)  ?  Skin blisters  ? Labetalol Other (See Comments)  ?  Patient passed out shortly after ingesting this at a doctor's office on 05/08/16 (was taken in conjunction with a tablet of Clonidine.)  ? ? ?Immunization History  ?Administered Date(s) Administered  ? PFIZER(Purple Top)SARS-COV-2 Vaccination 10/12/2019, 11/06/2019, 07/23/2020  ? Pneumococcal Polysaccharide-23 08/04/2017  ? Tdap 09/06/2017  ? ? ?Past Medical History:  ?Diagnosis Date  ? Asthma 2017  ? CAD in native artery   ? 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 Fibroids   ? GERD (gastroesophageal reflux disease)   ? Headache   ? "weekly"  (07/20/2017)  ? HLD (hyperlipidemia) 07/18/2017  ? Hx MRSA infection   ? Hypertension   ? Insomnia due to medical condition   ? Ischemic cardiomyopathy   ? Stroke ([place]) 07/18/2017  ? TIA (transient ischemic attack)   ? "I've had several"  ? ? ?Tobacco History: ?Social History  ? ?Tobacco Use  ?Smoking Status Every Day  ? Packs/day: 0.50  ? Years: 10.00  ? Pack years: 5.00  ? Types: Cigarettes  ?Smokeless Tobacco Never  ?Tobacco Comments  ? 2 to 3 per day  ? ?Ready to quit: Not Answered ?Counseling given: Not Answered ?Tobacco comments: 2 to 3 per day ? ? ?Outpatient Encounter Medications as of 11/05/2021  ?Medication Sig  ? albuterol (VENTOLIN HFA) 108 (90 Base) MCG/ACT inhaler INHALE 2 PUFFS INTO THE LUNGS EVERY 6 HOURS AS NEEDED FOR WHEEZING OR SHORTNESS OF BREATH  ? amLODipine (NORVASC) 10 MG tablet Take 1 tablet (10 mg total) by mouth daily.  ? carvedilol (COREG) 25 MG tablet TAKE 1 TABLET BY MOUTH TWICE  DAILY WITH A MEAL  ? famotidine (PEPCID) 20 MG tablet Take 1 tablet (20 mg total) by mouth 2 (two) times daily.  ? levocetirizine (XYZAL) 5 MG tablet Take 1 tablet (5 mg total) by mouth every evening.  ? linaclotide (LINZESS) 72 MCG capsule Take 1 capsule (72 mcg total) by mouth daily before breakfast.  ? losartan (COZAAR) 25 MG tablet TAKE 1 TABLET(25 MG) BY MOUTH DAILY  ?  rosuvastatin (CRESTOR) 10 MG tablet Take 1 tablet (10 mg total) by mouth daily.  ? traZODone (DESYREL) 150 MG tablet Take 1 tablet (150 mg total) by mouth at bedtime as needed for sleep.  ? aspirin EC 81 MG tablet Take 1 tablet (81 mg total) by mouth daily. Swallow whole. (Patient not taking: Reported on 11/05/2021)  ? ?No facility-administered encounter medications on file as of 11/05/2021.  ? ? ? ?Review of Systems ? ?Review of Systems  ?Constitutional: Negative.   ?HENT: Negative.    ?Cardiovascular: Negative.   ?Gastrointestinal: Negative.   ?Allergic/Immunologic: Negative.   ?Neurological: Negative.   ?Psychiatric/Behavioral: Negative.      ? ? ? ?Physical Exam ? ?BP 117/88 (BP Location: Right Arm, Patient Position: Sitting, Cuff Size: Normal)   Pulse 78   Temp 98 ?F (36.7 ?C)   Ht '5\' 7"'$  (1.702 m)   Wt 175 lb 6 oz (79.5 kg)   LMP 05/19/2013   SpO2 100%   BMI 27.47 kg/m?  ? ?Wt Readings from Last 5 Encounters:  ?11/05/21 175 lb 6 oz (79.5 kg)  ?09/05/21 181 lb (82.1 kg)  ?08/05/21 174 lb 9.6 oz (79.2 kg)  ?12/26/20 178 lb (80.7 kg)  ?10/29/20 179 lb (81.2 kg)  ? ? ? ?Physical Exam ?Vitals and nursing note reviewed. Exam conducted with a chaperone present.  ?Constitutional:   ?   General: She is not in acute distress. ?   Appearance: She is well-developed.  ?Cardiovascular:  ?   Rate and Rhythm: Normal rate and regular rhythm.  ?Pulmonary:  ?   Effort: Pulmonary effort is normal.  ?   Breath sounds: Normal breath sounds.  ?Abdominal:  ?   Hernia: There is no hernia in the left inguinal area or right inguinal area.  ?Genitourinary: ?   Comments: Pap performed. Patient tolerated well. No abnormal findings on exam. Vagina and cervix appear normal. No bleeding or drainage noted. ?Lymphadenopathy:  ?   Lower Body: No right inguinal adenopathy. No left inguinal adenopathy.  ?Neurological:  ?   Mental Status: She is alert and oriented to person, place, and time.  ? ? ? ?Lab Results: ? ?CBC ?   ?Component Value Date/Time  ? WBC 7.6 12/08/2017 1411  ? WBC 9.3 10/14/2017 0347  ? RBC 4.28 12/08/2017 1411  ? RBC 2.88 (L) 10/14/2017 0347  ? HGB 11.7 12/08/2017 1411  ? HCT 41.0 08/05/2021 1238  ? PLT 257 12/08/2017 1411  ? MCV 83 12/08/2017 1411  ? MCH 27.3 12/08/2017 1411  ? MCH 28.8 10/14/2017 0347  ? MCHC 33.1 12/08/2017 1411  ? MCHC 31.7 10/14/2017 0347  ? RDW 17.1 (H) 12/08/2017 1411  ? LYMPHSABS 2.3 08/04/2017 1406  ? MONOABS 0.5 07/18/2017 1850  ? EOSABS 0.1 08/04/2017 1406  ? BASOSABS 0.0 08/04/2017 1406  ? ? ?BMET ?   ?Component Value Date/Time  ? NA 143 08/05/2021 1238  ? K 4.1 08/05/2021 1238  ? CL 106 08/05/2021 1238  ? CO2 22 08/05/2021 1238  ?  GLUCOSE 101 (H) 08/05/2021 1238  ? GLUCOSE 115 (H) 10/14/2017 0347  ? BUN 14 08/05/2021 1238  ? CREATININE 1.08 (H) 08/05/2021 1238  ? CALCIUM 9.4 08/05/2021 1238  ? GFRNONAA 45 (L) 07/30/2020 1350  ? GFRAA 52 (L) 07/30/2020 1350  ? ? ?BNP ?   ?Component Value Date/Time  ? BNP 306.8 (H) 07/20/2017 1646  ? ? ?ProBNP ?No results found for: PROBNP ? ?Imaging: ?No results found. ? ? ?Assessment &  Plan:  ? ?Screening examination for STD (sexually transmitted disease) ?- NuSwab Vaginitis Plus (VG+) ? ?2. Encounter for gynecological examination without abnormal finding ? ?- Cytology - PAP([place]) ? ?3. Well woman exam with routine gynecological exam ? ? ?Follow up: ? ?Follow up in 3 months ? ? ? ? ?Fenton Foy, NP ?11/05/2021 ? ?

## 2021-11-06 NOTE — Addendum Note (Signed)
Addended by: Schuyler Amor on: 11/06/2021 09:05 AM ? ? Modules accepted: Orders ? ?

## 2021-11-06 NOTE — Addendum Note (Signed)
Addended by: Fenton Foy on: 11/06/2021 09:40 AM ? ? Modules accepted: Orders ? ?

## 2021-11-06 NOTE — Addendum Note (Signed)
Addended by: Blair Heys L on: 11/06/2021 03:04 PM ? ? Modules accepted: Orders ? ?

## 2021-11-06 NOTE — Addendum Note (Signed)
Addended by: Blair Heys L on: 11/06/2021 03:28 PM ? ? Modules accepted: Orders ? ?

## 2021-11-06 NOTE — Addendum Note (Signed)
Addended by: Blair Heys L on: 11/06/2021 03:40 PM ? ? Modules accepted: Orders ? ?

## 2021-11-06 NOTE — Addendum Note (Signed)
Addended by: Blair Heys L on: 11/06/2021 03:39 PM ? ? Modules accepted: Orders ? ?

## 2021-11-07 ENCOUNTER — Other Ambulatory Visit: Payer: Self-pay | Admitting: Nurse Practitioner

## 2021-11-07 DIAGNOSIS — B9689 Other specified bacterial agents as the cause of diseases classified elsewhere: Secondary | ICD-10-CM

## 2021-11-07 MED ORDER — CLINDAMYCIN HCL 300 MG PO CAPS: 300.0000 mg | ORAL_CAPSULE | Freq: Two times a day (BID) | ORAL | 0 refills | Status: AC

## 2021-11-08 LAB — NUSWAB VAGINITIS PLUS (VG+)
Atopobium vaginae: HIGH Score — AB
BVAB 2: HIGH Score — AB
Candida albicans, NAA: NEGATIVE
Candida glabrata, NAA: NEGATIVE
Chlamydia trachomatis, NAA: NEGATIVE
Megasphaera 1: HIGH Score — AB
Neisseria gonorrhoeae, NAA: NEGATIVE
Trich vag by NAA: NEGATIVE

## 2021-11-10 ENCOUNTER — Other Ambulatory Visit: Payer: Self-pay | Admitting: Nurse Practitioner

## 2021-11-12 LAB — PAP IG, CT-NG TV HSV 1/2 NAA
Chlamydia, Nuc. Acid Amp: NEGATIVE
Gonococcus, Nuc. Acid Amp: NEGATIVE
HSV 1 NAA: NEGATIVE
HSV 2 NAA: NEGATIVE
Trich vag by NAA: NEGATIVE

## 2021-12-04 ENCOUNTER — Other Ambulatory Visit: Payer: Medicare Other

## 2021-12-04 DIAGNOSIS — I25700 Atherosclerosis of coronary artery bypass graft(s), unspecified, with unstable angina pectoris: Secondary | ICD-10-CM | POA: Diagnosis not present

## 2021-12-04 DIAGNOSIS — E785 Hyperlipidemia, unspecified: Secondary | ICD-10-CM

## 2021-12-04 LAB — BASIC METABOLIC PANEL
BUN/Creatinine Ratio: 10 (ref 9–23)
BUN: 11 mg/dL (ref 6–24)
CO2: 22 mmol/L (ref 20–29)
Calcium: 8.8 mg/dL (ref 8.7–10.2)
Chloride: 102 mmol/L (ref 96–106)
Creatinine, Ser: 1.14 mg/dL — ABNORMAL HIGH (ref 0.57–1.00)
Glucose: 99 mg/dL (ref 70–99)
Potassium: 4.3 mmol/L (ref 3.5–5.2)
Sodium: 138 mmol/L (ref 134–144)
eGFR: 56 mL/min/{1.73_m2} — ABNORMAL LOW (ref >59)

## 2021-12-04 LAB — LIPID PANEL
Chol/HDL Ratio: 2.6 ratio (ref 0.0–4.4)
Cholesterol, Total: 119 mg/dL (ref 100–199)
HDL: 46 mg/dL (ref >39)
LDL Chol Calc (NIH): 46 mg/dL (ref 0–99)
Triglycerides: 160 mg/dL — ABNORMAL HIGH (ref 0–149)
VLDL Cholesterol Cal: 27 mg/dL (ref 5–40)

## 2021-12-04 LAB — ALT: ALT: 26 IU/L (ref 0–32)

## 2021-12-16 ENCOUNTER — Other Ambulatory Visit: Payer: Self-pay | Admitting: Family Medicine

## 2021-12-16 DIAGNOSIS — K581 Irritable bowel syndrome with constipation: Secondary | ICD-10-CM

## 2022-01-06 ENCOUNTER — Other Ambulatory Visit: Payer: Self-pay | Admitting: Family Medicine

## 2022-01-06 DIAGNOSIS — I10 Essential (primary) hypertension: Secondary | ICD-10-CM

## 2022-02-04 ENCOUNTER — Other Ambulatory Visit: Payer: Self-pay | Admitting: Family Medicine

## 2022-02-04 DIAGNOSIS — J302 Other seasonal allergic rhinitis: Secondary | ICD-10-CM

## 2022-02-10 ENCOUNTER — Ambulatory Visit: Payer: Medicare Other | Admitting: Family Medicine

## 2022-03-03 ENCOUNTER — Ambulatory Visit: Payer: Medicare Other | Admitting: Family Medicine

## 2022-03-11 ENCOUNTER — Encounter: Payer: Self-pay | Admitting: Nurse Practitioner

## 2022-03-11 ENCOUNTER — Telehealth (INDEPENDENT_AMBULATORY_CARE_PROVIDER_SITE_OTHER): Payer: Medicare Other | Admitting: Nurse Practitioner

## 2022-03-11 DIAGNOSIS — K581 Irritable bowel syndrome with constipation: Secondary | ICD-10-CM

## 2022-03-11 DIAGNOSIS — K219 Gastro-esophageal reflux disease without esophagitis: Secondary | ICD-10-CM | POA: Diagnosis not present

## 2022-03-11 DIAGNOSIS — J302 Other seasonal allergic rhinitis: Secondary | ICD-10-CM | POA: Diagnosis not present

## 2022-03-11 DIAGNOSIS — I10 Essential (primary) hypertension: Secondary | ICD-10-CM | POA: Diagnosis not present

## 2022-03-11 DIAGNOSIS — G4700 Insomnia, unspecified: Secondary | ICD-10-CM

## 2022-03-11 DIAGNOSIS — I25118 Atherosclerotic heart disease of native coronary artery with other forms of angina pectoris: Secondary | ICD-10-CM | POA: Diagnosis not present

## 2022-03-11 MED ORDER — AMLODIPINE BESYLATE 10 MG PO TABS: 10.0000 mg | ORAL_TABLET | Freq: Every day | ORAL | 2 refills | Status: DC

## 2022-03-11 MED ORDER — LOSARTAN POTASSIUM 25 MG PO TABS: ORAL_TABLET | ORAL | 3 refills | Status: DC

## 2022-03-11 MED ORDER — CARVEDILOL 25 MG PO TABS: 25.0000 mg | ORAL_TABLET | Freq: Two times a day (BID) | ORAL | 2 refills | Status: DC

## 2022-03-11 MED ORDER — ROSUVASTATIN CALCIUM 10 MG PO TABS: 10.0000 mg | ORAL_TABLET | Freq: Every day | ORAL | 3 refills | Status: DC

## 2022-03-11 MED ORDER — TRAZODONE HCL 150 MG PO TABS: 150.0000 mg | ORAL_TABLET | Freq: Every evening | ORAL | 2 refills | Status: DC | PRN

## 2022-03-11 MED ORDER — LEVOCETIRIZINE DIHYDROCHLORIDE 5 MG PO TABS: 5.0000 mg | ORAL_TABLET | Freq: Every evening | ORAL | 2 refills | Status: DC

## 2022-03-11 MED ORDER — FAMOTIDINE 20 MG PO TABS: 20.0000 mg | ORAL_TABLET | Freq: Two times a day (BID) | ORAL | 3 refills | Status: DC

## 2022-03-11 MED ORDER — LINACLOTIDE 72 MCG PO CAPS: ORAL_CAPSULE | ORAL | 1 refills | Status: DC

## 2022-03-11 NOTE — Progress Notes (Signed)
Virtual Visit via Telephone Note  I connected with Karen Ochoa on 03/11/22 at 11:20 AM EDT by telephone and verified that I am speaking with the correct person using two identifiers.  Location: Patient: home Provider: office   I discussed the limitations, risks, security and privacy concerns of performing an evaluation and management service by telephone and the availability of in person appointments. I also discussed with the patient that there may be a patient responsible charge related to this service. The patient expressed understanding and agreed to proceed.   History of Present Illness:  ARADHYA SHELLENBARGER is a very pleasant 56 year old female with a medical history significant for essential hypertension, coronary artery disease, history of chronic systolic CHF, tobacco dependence, and history of CVA   Patient presents today for a follow up on hypertension. States that she does need medication refills. She states that she has been doing well. Reports some loss of appetite. Follows a low salt diet, but is very sedentary. We discussed that starting a walking routine could help increase appetite. Denies f/c/s, n/v/d, hemoptysis, PND, leg swelling Denies chest pain or edema     Observations/Objective:     11/05/2021    3:11 PM 09/05/2021   11:37 AM 08/05/2021   12:02 PM  Vitals with BMI  Height '5\' 7"'$  '5\' 7"'$  '5\' 7"'$   Weight 175 lbs 6 oz 181 lbs 174 lbs 10 oz  BMI 27.46 97.41 63.84  Systolic 536 468 032  Diastolic 88 68 83  Pulse 78 80 75      Assessment and Plan:  1. Essential hypertension  - amLODipine (NORVASC) 10 MG tablet; Take 1 tablet (10 mg total) by mouth daily.  Dispense: 30 tablet; Refill: 2 - carvedilol (COREG) 25 MG tablet; Take 1 tablet (25 mg total) by mouth 2 (two) times daily with a meal.  Dispense: 60 tablet; Refill: 2 - losartan (COZAAR) 25 MG tablet; TAKE TAKE 1 TABLET BY MOUTH  DAILY  Dispense: 80 tablet; Refill: 3  2. Coronary artery disease of native  artery of native heart with stable angina pectoris (HCC)  - carvedilol (COREG) 25 MG tablet; Take 1 tablet (25 mg total) by mouth 2 (two) times daily with a meal.  Dispense: 60 tablet; Refill: 2  3. Irritable bowel syndrome with constipation  - linaclotide (LINZESS) 72 MCG capsule; TAKE 1 CAPSULE BY MOUTH DAILY  BEFORE BREAKFAST  Dispense: 90 capsule; Refill: 1  4. INSOMNIA, CHRONIC  - traZODone (DESYREL) 150 MG tablet; Take 1 tablet (150 mg total) by mouth at bedtime as needed for sleep.  Dispense: 90 tablet; Refill: 2  5. Gastroesophageal reflux disease without esophagitis  - famotidine (PEPCID) 20 MG tablet; Take 1 tablet (20 mg total) by mouth 2 (two) times daily.  Dispense: 180 tablet; Refill: 3  6. Seasonal allergies  - levocetirizine (XYZAL) 5 MG tablet; Take 1 tablet (5 mg total) by mouth every evening.  Dispense: 90 tablet; Refill: 2  Follow up:  Follow up in 3 months or sooner if needed    I discussed the assessment and treatment plan with the patient. The patient was provided an opportunity to ask questions and all were answered. The patient agreed with the plan and demonstrated an understanding of the instructions.   The patient was advised to call back or seek an in-person evaluation if the symptoms worsen or if the condition fails to improve as anticipated.  I provided 22 minutes of non-face-to-face time during this encounter.   Kenney Houseman  Leodis Binet, NP

## 2022-03-11 NOTE — Patient Instructions (Signed)
1. Essential hypertension  - amLODipine (NORVASC) 10 MG tablet; Take 1 tablet (10 mg total) by mouth daily.  Dispense: 30 tablet; Refill: 2 - carvedilol (COREG) 25 MG tablet; Take 1 tablet (25 mg total) by mouth 2 (two) times daily with a meal.  Dispense: 60 tablet; Refill: 2 - losartan (COZAAR) 25 MG tablet; TAKE TAKE 1 TABLET BY MOUTH  DAILY  Dispense: 80 tablet; Refill: 3  2. Coronary artery disease of native artery of native heart with stable angina pectoris (HCC)  - carvedilol (COREG) 25 MG tablet; Take 1 tablet (25 mg total) by mouth 2 (two) times daily with a meal.  Dispense: 60 tablet; Refill: 2  3. Irritable bowel syndrome with constipation  - linaclotide (LINZESS) 72 MCG capsule; TAKE 1 CAPSULE BY MOUTH DAILY  BEFORE BREAKFAST  Dispense: 90 capsule; Refill: 1  4. INSOMNIA, CHRONIC  - traZODone (DESYREL) 150 MG tablet; Take 1 tablet (150 mg total) by mouth at bedtime as needed for sleep.  Dispense: 90 tablet; Refill: 2  5. Gastroesophageal reflux disease without esophagitis  - famotidine (PEPCID) 20 MG tablet; Take 1 tablet (20 mg total) by mouth 2 (two) times daily.  Dispense: 180 tablet; Refill: 3  6. Seasonal allergies  - levocetirizine (XYZAL) 5 MG tablet; Take 1 tablet (5 mg total) by mouth every evening.  Dispense: 90 tablet; Refill: 2  Follow up:  Follow up in 3 months or sooner if needed

## 2022-04-22 ENCOUNTER — Other Ambulatory Visit: Payer: Self-pay

## 2022-04-22 DIAGNOSIS — I10 Essential (primary) hypertension: Secondary | ICD-10-CM

## 2022-04-22 MED ORDER — AMLODIPINE BESYLATE 10 MG PO TABS: 10.0000 mg | ORAL_TABLET | Freq: Every day | ORAL | 2 refills | Status: DC

## 2022-04-24 ENCOUNTER — Other Ambulatory Visit: Payer: Self-pay | Admitting: Nurse Practitioner

## 2022-04-24 DIAGNOSIS — I10 Essential (primary) hypertension: Secondary | ICD-10-CM

## 2022-04-24 MED ORDER — AMLODIPINE BESYLATE 10 MG PO TABS: 10.0000 mg | ORAL_TABLET | Freq: Every day | ORAL | 2 refills | Status: DC

## 2022-05-01 ENCOUNTER — Other Ambulatory Visit: Payer: Self-pay

## 2022-05-01 DIAGNOSIS — I10 Essential (primary) hypertension: Secondary | ICD-10-CM

## 2022-05-01 MED ORDER — AMLODIPINE BESYLATE 10 MG PO TABS: 10.0000 mg | ORAL_TABLET | Freq: Every day | ORAL | 1 refills | Status: DC

## 2022-06-11 ENCOUNTER — Other Ambulatory Visit: Payer: Self-pay | Admitting: Nurse Practitioner

## 2022-06-11 DIAGNOSIS — I10 Essential (primary) hypertension: Secondary | ICD-10-CM

## 2022-06-11 DIAGNOSIS — I25118 Atherosclerotic heart disease of native coronary artery with other forms of angina pectoris: Secondary | ICD-10-CM

## 2022-06-17 ENCOUNTER — Encounter: Payer: Self-pay | Admitting: Nurse Practitioner

## 2022-06-17 ENCOUNTER — Ambulatory Visit (INDEPENDENT_AMBULATORY_CARE_PROVIDER_SITE_OTHER): Payer: Medicare Other | Admitting: Nurse Practitioner

## 2022-06-17 VITALS — BP 122/78 | HR 78 | Ht 63.0 in | Wt 183.0 lb

## 2022-06-17 DIAGNOSIS — Z1231 Encounter for screening mammogram for malignant neoplasm of breast: Secondary | ICD-10-CM | POA: Diagnosis not present

## 2022-06-17 DIAGNOSIS — I25118 Atherosclerotic heart disease of native coronary artery with other forms of angina pectoris: Secondary | ICD-10-CM

## 2022-06-17 DIAGNOSIS — Z1211 Encounter for screening for malignant neoplasm of colon: Secondary | ICD-10-CM | POA: Diagnosis not present

## 2022-06-17 DIAGNOSIS — F17200 Nicotine dependence, unspecified, uncomplicated: Secondary | ICD-10-CM

## 2022-06-17 DIAGNOSIS — I10 Essential (primary) hypertension: Secondary | ICD-10-CM | POA: Diagnosis not present

## 2022-06-17 DIAGNOSIS — K581 Irritable bowel syndrome with constipation: Secondary | ICD-10-CM

## 2022-06-17 MED ORDER — LINACLOTIDE 72 MCG PO CAPS: ORAL_CAPSULE | ORAL | 1 refills | Status: DC

## 2022-06-17 MED ORDER — LOSARTAN POTASSIUM 25 MG PO TABS: ORAL_TABLET | ORAL | 3 refills | Status: DC

## 2022-06-17 MED ORDER — CARVEDILOL 25 MG PO TABS: 25.0000 mg | ORAL_TABLET | Freq: Two times a day (BID) | ORAL | 2 refills | Status: DC

## 2022-06-17 NOTE — Patient Instructions (Addendum)
1. Colon cancer screening  - Ambulatory referral to Gastroenterology  2. Encounter for screening mammogram for malignant neoplasm of breast  - MM Digital Screening  3. Current smoker  - Ambulatory Referral Lung Cancer Screening  Pulmonary  4. Essential hypertension  - carvedilol (COREG) 25 MG tablet; Take 1 tablet (25 mg total) by mouth 2 (two) times daily with a meal.  Dispense: 60 tablet; Refill: 2 - losartan (COZAAR) 25 MG tablet; TAKE TAKE 1 TABLET BY MOUTH  DAILY  Dispense: 80 tablet; Refill: 3  5. Coronary artery disease of native artery of native heart with stable angina pectoris (HCC)  - carvedilol (COREG) 25 MG tablet; Take 1 tablet (25 mg total) by mouth 2 (two) times daily with a meal.  Dispense: 60 tablet; Refill: 2  6. Irritable bowel syndrome with constipation  - linaclotide (LINZESS) 72 MCG capsule; TAKE 1 CAPSULE BY MOUTH DAILY  BEFORE BREAKFAST  Dispense: 90 capsule; Refill: 1  Follow up:  Follow up in 6 months

## 2022-06-17 NOTE — Progress Notes (Signed)
$'@Patient'Y$  ID: Karen Ochoa, female    DOB: 25-Jan-1966, 56 y.o.   MRN: [id number]  Chief Complaint  Patient presents with   Hypertension    Referring provider: [name], FNP   HPI  Karen Ochoa is a very pleasant 56 year old female with a medical history significant for essential hypertension, coronary artery disease, history of chronic systolic CHF, tobacco dependence, and history of CVA   Patient presents today for a follow up on hypertension. States that she does need medication refills. She states that she has been doing well. Follows a low salt diet, but is very sedentary. Denies f/c/s, n/v/d, hemoptysis, PND, leg swelling Denies chest pain or edema  Note:Declines labs today   Allergies  Allergen Reactions   Penicillins Anaphylaxis and Other (See Comments)    Has patient had a PCN reaction causing immediate rash, facial/tongue/throat swelling, SOB or lightheadedness with hypotension: Yes Has patient had a PCN reaction causing severe rash involving mucus membranes or skin necrosis: Yes Has patient had a PCN reaction that required hospitalization Yes Has patient had a PCN reaction occurring within the last 10 years: No If all of the above answers are "NO", then may proceed with Cephalosporin use.    Sulfonamide Derivatives Anaphylaxis   Clonidine Derivatives Other (See Comments)    Patient passed out shortly after ingesting this at a doctor's office on 05/08/16 (was taken in conjunction with a tablet of Labetalol.)   Flagyl [Metronidazole] Swelling and Other (See Comments)    Skin blisters   Labetalol Other (See Comments)    Patient passed out shortly after ingesting this at a doctor's office on 05/08/16 (was taken in conjunction with a tablet of Clonidine.)    Immunization History  Administered Date(s) Administered   PFIZER(Purple Top)SARS-COV-2 Vaccination 10/12/2019, 11/06/2019, 07/23/2020   Pneumococcal Polysaccharide-23 08/04/2017   Tdap 09/06/2017     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   Fibroids    GERD (gastroesophageal reflux disease)    Headache    "weekly" (07/20/2017)   HLD (hyperlipidemia) 07/18/2017   Hx MRSA infection    Hypertension    Insomnia due to medical condition    Ischemic cardiomyopathy    Stroke ([place]) 07/18/2017   TIA (transient ischemic attack)    "I've had several"    Tobacco History: Social History   Tobacco Use  Smoking Status Every Day   Packs/day: 1.00   Years: 10.00   Total pack years: 10.00   Types: Cigarettes  Smokeless Tobacco Never  Tobacco Comments   2 to 3 per day   Ready to quit: Not Answered Counseling given: Not Answered Tobacco comments: 2 to 3 per day   Outpatient Encounter Medications as of 06/17/2022  Medication Sig   albuterol (VENTOLIN HFA) 108 (90 Base) MCG/ACT inhaler INHALE 2 PUFFS INTO THE LUNGS EVERY 6 HOURS AS NEEDED FOR WHEEZING OR SHORTNESS OF BREATH   amLODipine (NORVASC) 10 MG tablet Take 1 tablet (10 mg total) by mouth daily.   famotidine (PEPCID) 20 MG tablet Take 1 tablet (20 mg total) by mouth 2 (two) times daily.   levocetirizine (XYZAL) 5 MG tablet Take 1 tablet (5 mg total) by mouth every evening.   rosuvastatin (CRESTOR) 10 MG tablet Take 1 tablet (10 mg total) by mouth daily.   traZODone (DESYREL) 150 MG tablet Take 1 tablet (150 mg total) by mouth at bedtime  as needed for sleep.   [DISCONTINUED] carvedilol (COREG) 25 MG tablet Take 1 tablet (25 mg total) by mouth 2 (two) times daily with a meal.   [DISCONTINUED] linaclotide (LINZESS) 72 MCG capsule TAKE 1 CAPSULE BY MOUTH DAILY  BEFORE BREAKFAST   [DISCONTINUED] losartan (COZAAR) 25 MG tablet TAKE TAKE 1 TABLET BY MOUTH  DAILY   aspirin EC 81 MG tablet Take 1 tablet (81 mg total) by mouth daily. Swallow whole. (Patient not taking: Reported on 11/05/2021)   carvedilol (COREG) 25 MG tablet Take 1 tablet (25 mg total) by mouth 2 (two) times  daily with a meal.   linaclotide (LINZESS) 72 MCG capsule TAKE 1 CAPSULE BY MOUTH DAILY  BEFORE BREAKFAST   losartan (COZAAR) 25 MG tablet TAKE TAKE 1 TABLET BY MOUTH  DAILY   No facility-administered encounter medications on file as of 06/17/2022.     Review of Systems  Review of Systems  Constitutional: Negative.   HENT: Negative.    Cardiovascular: Negative.   Gastrointestinal: Negative.   Allergic/Immunologic: Negative.   Neurological: Negative.   Psychiatric/Behavioral: Negative.         Physical Exam  BP 122/78   Pulse 78   Ht '5\' 3"'$  (1.6 m)   Wt 183 lb (83 kg)   LMP 05/19/2013   SpO2 96%   BMI 32.42 kg/m   Wt Readings from Last 5 Encounters:  06/17/22 183 lb (83 kg)  11/05/21 175 lb 6 oz (79.5 kg)  09/05/21 181 lb (82.1 kg)  08/05/21 174 lb 9.6 oz (79.2 kg)  12/26/20 178 lb (80.7 kg)     Physical Exam Vitals and nursing note reviewed.  Constitutional:      General: She is not in acute distress.    Appearance: She is well-developed.  Cardiovascular:     Rate and Rhythm: Normal rate and regular rhythm.  Pulmonary:     Effort: Pulmonary effort is normal.     Breath sounds: Normal breath sounds.  Neurological:     Mental Status: She is alert and oriented to person, place, and time.      Assessment & Plan:   Colon cancer screening - Ambulatory referral to Gastroenterology  2. Encounter for screening mammogram for malignant neoplasm of breast  - MM Digital Screening  3. Current smoker  - Ambulatory Referral Lung Cancer Screening [facility]  4. Essential hypertension  - carvedilol (COREG) 25 MG tablet; Take 1 tablet (25 mg total) by mouth 2 (two) times daily with a meal.  Dispense: 60 tablet; Refill: 2 - losartan (COZAAR) 25 MG tablet; TAKE TAKE 1 TABLET BY MOUTH  DAILY  Dispense: 80 tablet; Refill: 3  5. Coronary artery disease of native artery of native heart with stable angina pectoris (HCC)  - carvedilol (COREG) 25 MG tablet;  Take 1 tablet (25 mg total) by mouth 2 (two) times daily with a meal.  Dispense: 60 tablet; Refill: 2  6. Irritable bowel syndrome with constipation  - linaclotide (LINZESS) 72 MCG capsule; TAKE 1 CAPSULE BY MOUTH DAILY  BEFORE BREAKFAST  Dispense: 90 capsule; Refill: 1  Follow up:  Follow up in 6 months     Fenton Foy, NP 06/17/2022

## 2022-06-17 NOTE — Assessment & Plan Note (Signed)
-   Ambulatory referral to Gastroenterology  2. Encounter for screening mammogram for malignant neoplasm of breast  - MM Digital Screening  3. Current smoker  - Ambulatory Referral Lung Cancer Screening [facility]  4. Essential hypertension  - carvedilol (COREG) 25 MG tablet; Take 1 tablet (25 mg total) by mouth 2 (two) times daily with a meal.  Dispense: 60 tablet; Refill: 2 - losartan (COZAAR) 25 MG tablet; TAKE TAKE 1 TABLET BY MOUTH  DAILY  Dispense: 80 tablet; Refill: 3  5. Coronary artery disease of native artery of native heart with stable angina pectoris (HCC)  - carvedilol (COREG) 25 MG tablet; Take 1 tablet (25 mg total) by mouth 2 (two) times daily with a meal.  Dispense: 60 tablet; Refill: 2  6. Irritable bowel syndrome with constipation  - linaclotide (LINZESS) 72 MCG capsule; TAKE 1 CAPSULE BY MOUTH DAILY  BEFORE BREAKFAST  Dispense: 90 capsule; Refill: 1  Follow up:  Follow up in 6 months

## 2022-07-15 ENCOUNTER — Ambulatory Visit (INDEPENDENT_AMBULATORY_CARE_PROVIDER_SITE_OTHER): Payer: Medicare Other

## 2022-07-15 VITALS — Ht 67.0 in | Wt 182.0 lb

## 2022-07-15 DIAGNOSIS — F17200 Nicotine dependence, unspecified, uncomplicated: Secondary | ICD-10-CM

## 2022-07-15 DIAGNOSIS — N951 Menopausal and female climacteric states: Secondary | ICD-10-CM

## 2022-07-15 DIAGNOSIS — Z0000 Encounter for general adult medical examination without abnormal findings: Secondary | ICD-10-CM | POA: Diagnosis not present

## 2022-07-15 DIAGNOSIS — Z1211 Encounter for screening for malignant neoplasm of colon: Secondary | ICD-10-CM

## 2022-07-15 NOTE — Patient Instructions (Signed)
[name] , Thank you for taking time to come for your Medicare Wellness Visit. I appreciate your ongoing commitment to your health goals. Please review the following plan we discussed and let me know if I can assist you in the future.   These are the goals we discussed:  Goals      DIET - EAT MORE FRUITS AND VEGETABLES        This is a list of the screening recommended for you and due dates:  Health Maintenance  Topic Date Due   Colon Cancer Screening  Never done   Pap Smear  05/21/2011   Mammogram  Never done   Zoster (Shingles) Vaccine (1 of 2) Never done   COVID-19 Vaccine (4 - 2023-24 season) 03/20/2022   Medicare Annual Wellness Visit  07/16/2023   DTaP/Tdap/Td vaccine (2 - Td or Tdap) 09/07/2027   Hepatitis C Screening: USPSTF Recommendation to screen - Ages 62-79 yo.  Completed   HIV Screening  Completed   HPV Vaccine  Aged Out    Advanced directives: Will mail a acopy  Conditions/risks identified: Trip risk as she uses cane   Next appointment: Follow up in one year for your annual wellness visit. 07/19/2023  Preventive Care 40-64 Years, Female Preventive care refers to lifestyle choices and visits with your health care provider that can promote health and wellness. What does preventive care include? A yearly physical exam. This is also called an annual well check. Dental exams once or twice a year. Routine eye exams. Ask your health care provider how often you should have your eyes checked. Personal lifestyle choices, including: Daily care of your teeth and gums. Regular physical activity. Eating a healthy diet. Avoiding tobacco and drug use. Limiting alcohol use. Practicing safe sex. Taking low-dose aspirin daily starting at age 62. Taking vitamin and mineral supplements as recommended by your health care provider. What happens during an annual well check? The services and screenings done by your health care provider during your annual well check will depend  on your age, overall health, lifestyle risk factors, and family history of disease. Counseling  Your health care provider may ask you questions about your: Alcohol use. Tobacco use. Drug use. Emotional well-being. Home and relationship well-being. Sexual activity. Eating habits. Work and work Statistician. Method of birth control. Menstrual cycle. Pregnancy history. Screening  You may have the following tests or measurements: Height, weight, and BMI. Blood pressure. Lipid and cholesterol levels. These may be checked every 5 years, or more frequently if you are over 85 years old. Skin check. Lung cancer screening. You may have this screening every year starting at age 71 if you have a 30-pack-year history of smoking and currently smoke or have quit within the past 15 years. Fecal occult blood test (FOBT) of the stool. You may have this test every year starting at age 52. Flexible sigmoidoscopy or colonoscopy. You may have a sigmoidoscopy every 5 years or a colonoscopy every 10 years starting at age 28. Hepatitis C blood test. Hepatitis B blood test. Sexually transmitted disease (STD) testing. Diabetes screening. This is done by checking your blood sugar (glucose) after you have not eaten for a while (fasting). You may have this done every 1-3 years. Mammogram. This may be done every 1-2 years. Talk to your health care provider about when you should start having regular mammograms. This may depend on whether you have a family history of breast cancer. BRCA-related cancer screening. This may be done if you  have a family history of breast, ovarian, tubal, or peritoneal cancers. Pelvic exam and Pap test. This may be done every 3 years starting at age 86. Starting at age 79, this may be done every 5 years if you have a Pap test in combination with an HPV test. Bone density scan. This is done to screen for osteoporosis. You may have this scan if you are at high risk for osteoporosis. Discuss  your test results, treatment options, and if necessary, the need for more tests with your health care provider. Vaccines  Your health care provider may recommend certain vaccines, such as: Influenza vaccine. This is recommended every year. Tetanus, diphtheria, and acellular pertussis (Tdap, Td) vaccine. You may need a Td booster every 10 years. Zoster vaccine. You may need this after age 57. Pneumococcal 13-valent conjugate (PCV13) vaccine. You may need this if you have certain conditions and were not previously vaccinated. Pneumococcal polysaccharide (PPSV23) vaccine. You may need one or two doses if you smoke cigarettes or if you have certain conditions. Talk to your health care provider about which screenings and vaccines you need and how often you need them. This information is not intended to replace advice given to you by your health care provider. Make sure you discuss any questions you have with your health care provider. Document Released: 08/02/2015 Document Revised: 03/25/2016 Document Reviewed: 05/07/2015 Elsevier Interactive Patient Education  2017 [employer] Prevention in the Home Falls can cause injuries. They can happen to people of all ages. There are many things you can do to make your home safe and to help prevent falls. What can I do on the outside of my home? Regularly fix the edges of walkways and driveways and fix any cracks. Remove anything that might make you trip as you walk through a door, such as a raised step or threshold. Trim any bushes or trees on the path to your home. Use bright outdoor lighting. Clear any walking paths of anything that might make someone trip, such as rocks or tools. Regularly check to see if handrails are loose or broken. Make sure that both sides of any steps have handrails. Any raised decks and porches should have guardrails on the edges. Have any leaves, snow, or ice cleared regularly. Use sand or salt on walking paths  during winter. Clean up any spills in your garage right away. This includes oil or grease spills. What can I do in the bathroom? Use night lights. Install grab bars by the toilet and in the tub and shower. Do not use towel bars as grab bars. Use non-skid mats or decals in the tub or shower. If you need to sit down in the shower, use a plastic, non-slip stool. Keep the floor dry. Clean up any water that spills on the floor as soon as it happens. Remove soap buildup in the tub or shower regularly. Attach bath mats securely with double-sided non-slip rug tape. Do not have throw rugs and other things on the floor that can make you trip. What can I do in the bedroom? Use night lights. Make sure that you have a light by your bed that is easy to reach. Do not use any sheets or blankets that are too big for your bed. They should not hang down onto the floor. Have a firm chair that has side arms. You can use this for support while you get dressed. Do not have throw rugs and other things on the floor that  can make you trip. What can I do in the kitchen? Clean up any spills right away. Avoid walking on wet floors. Keep items that you use a lot in easy-to-reach places. If you need to reach something above you, use a strong step stool that has a grab bar. Keep electrical cords out of the way. Do not use floor polish or wax that makes floors slippery. If you must use wax, use non-skid floor wax. Do not have throw rugs and other things on the floor that can make you trip. What can I do with my stairs? Do not leave any items on the stairs. Make sure that there are handrails on both sides of the stairs and use them. Fix handrails that are broken or loose. Make sure that handrails are as long as the stairways. Check any carpeting to make sure that it is firmly attached to the stairs. Fix any carpet that is loose or worn. Avoid having throw rugs at the top or bottom of the stairs. If you do have throw  rugs, attach them to the floor with carpet tape. Make sure that you have a light switch at the top of the stairs and the bottom of the stairs. If you do not have them, ask someone to add them for you. What else can I do to help prevent falls? Wear shoes that: Do not have high heels. Have rubber bottoms. Are comfortable and fit you well. Are closed at the toe. Do not wear sandals. If you use a stepladder: Make sure that it is fully opened. Do not climb a closed stepladder. Make sure that both sides of the stepladder are locked into place. Ask someone to hold it for you, if possible. Clearly mark and make sure that you can see: Any grab bars or handrails. First and last steps. Where the edge of each step is. Use tools that help you move around (mobility aids) if they are needed. These include: Canes. Walkers. Scooters. Crutches. Turn on the lights when you go into a dark area. Replace any light bulbs as soon as they burn out. Set up your furniture so you have a clear path. Avoid moving your furniture around. If any of your floors are uneven, fix them. If there are any pets around you, be aware of where they are. Review your medicines with your doctor. Some medicines can make you feel dizzy. This can increase your chance of falling. Ask your doctor what other things that you can do to help prevent falls. This information is not intended to replace advice given to you by your health care provider. Make sure you discuss any questions you have with your health care provider. Document Released: 05/02/2009 Document Revised: 12/12/2015 Document Reviewed: 08/10/2014 Elsevier Interactive Patient Education  2017 [employer].

## 2022-07-15 NOTE — Progress Notes (Addendum)
I connected with  Karen Ochoa on 07/21/22 by a audio enabled telemedicine application and verified that I am speaking with the correct person using two identifiers.  Patient Location: Home  Provider Location: Home Office  I discussed the limitations of evaluation and management by telemedicine. The patient expressed understanding and agreed to proceed.  Subjective:   Karen Ochoa is a 56 y.o. female who presents for Medicare Annual (Subsequent) preventive examination.  Review of Systems    Defer to PCP  Cardiac Risk Factors include: advanced age (>59mn, >>48women);dyslipidemia;hypertension;sedentary lifestyle;obesity (BMI >30kg/m2)     Objective:    Today's Vitals   07/15/22 1049  Weight: 182 lb (82.6 kg)  Height: '5\' 7"'$  (1.702 m)   Body mass index is 28.51 kg/m.     07/15/2022   10:56 AM 12/26/2020    1:21 PM 10/06/2017    6:00 AM 10/05/2017    9:04 AM 07/20/2017    4:59 PM 07/18/2017    2:30 PM 01/21/2017    1:28 PM  Advanced Directives  Does Patient Have a Medical Advance Directive? No No No No No No No  Would patient like information on creating a medical advance directive? Yes (MAU/Ambulatory/Procedural Areas - Information given) No - Patient declined No - Patient declined  No - Patient declined  No - Patient declined    Current Medications (verified) Outpatient Encounter Medications as of 07/15/2022  Medication Sig   albuterol (VENTOLIN HFA) 108 (90 Base) MCG/ACT inhaler INHALE 2 PUFFS INTO THE LUNGS EVERY 6 HOURS AS NEEDED FOR WHEEZING OR SHORTNESS OF BREATH   amLODipine (NORVASC) 10 MG tablet Take 1 tablet (10 mg total) by mouth daily.   carvedilol (COREG) 25 MG tablet Take 1 tablet (25 mg total) by mouth 2 (two) times daily with a meal.   famotidine (PEPCID) 20 MG tablet Take 1 tablet (20 mg total) by mouth 2 (two) times daily.   levocetirizine (XYZAL) 5 MG tablet Take 1 tablet (5 mg total) by mouth every evening.   linaclotide (LINZESS) 72 MCG capsule TAKE 1  CAPSULE BY MOUTH DAILY  BEFORE BREAKFAST   losartan (COZAAR) 25 MG tablet TAKE TAKE 1 TABLET BY MOUTH  DAILY   rosuvastatin (CRESTOR) 10 MG tablet Take 1 tablet (10 mg total) by mouth daily.   traZODone (DESYREL) 150 MG tablet Take 1 tablet (150 mg total) by mouth at bedtime as needed for sleep.   [DISCONTINUED] aspirin EC 81 MG tablet Take 1 tablet (81 mg total) by mouth daily. Swallow whole. (Patient not taking: Reported on 11/05/2021)   No facility-administered encounter medications on file as of 07/15/2022.    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   Fibroids    GERD (gastroesophageal reflux disease)    Headache    "weekly" (07/20/2017)   HLD (hyperlipidemia) 07/18/2017   Hx MRSA infection    Hypertension    Insomnia due to medical condition    Ischemic cardiomyopathy    Stroke ([facility] 07/18/2017   TIA (transient ischemic attack)    "I've had several"   Past Surgical History:  Procedure Laterality Date   CORONARY ARTERY BYPASS GRAFT N/A 10/07/2017   Procedure: CORONARY ARTERY BYPASS GRAFTING (CABG) ON PUMP TIMES 5 USING LEFT INTERNAL MAMMARY ARTERY AND BILATERAL GREATER SAPHENOUS VEIN VIA ENDOHARDEST.;  Surgeon:  Gaye Pollack, MD;  Location: [facility] OR;  Service: Open Heart Surgery;  Laterality: N/A;   RIGHT/LEFT HEART CATH AND CORONARY ANGIOGRAPHY N/A 10/05/2017   Procedure: RIGHT/LEFT HEART CATH AND CORONARY ANGIOGRAPHY;  Surgeon: Belva Crome, MD;  Location: [facility];  Service: Cardiovascular;  Laterality: N/A;   TEE WITHOUT CARDIOVERSION N/A 10/07/2017   Procedure: TRANSESOPHAGEAL ECHOCARDIOGRAM (TEE);  Surgeon: Gaye Pollack, MD;  Location: [place];  Service: Open Heart Surgery;  Laterality: N/A;   WISDOM TOOTH EXTRACTION     Family History  Problem Relation Age of Onset    Hypertension Mother    Diabetes Sister    Asthma Brother    Diabetes Brother    Anesthesia problems Neg Hx    Social History   Socioeconomic History   Marital status: Single    Spouse name: Not on file   Number of children: Not on file   Years of education: Not on file   Highest education level: Not on file  Occupational History   Not on file  Tobacco Use   Smoking status: Every Day    Packs/day: 1.00    Years: 10.00    Total pack years: 10.00    Types: Cigarettes   Smokeless tobacco: Never   Tobacco comments:    2 to 3 per day  Vaping Use   Vaping Use: Never used  Substance and Sexual Activity   Alcohol use: No    Alcohol/week: 2.0 standard drinks of alcohol    Types: 2 Glasses of wine per week    Comment: 08/04/2017--states no    Drug use: No   Sexual activity: Yes    Birth control/protection: None  Other Topics Concern   Not on file  Social History Narrative   ** Merged History Encounter **       Social Determinants of Health   Financial Resource Strain: Low Risk  (07/15/2022)   Overall Financial Resource Strain (CARDIA)    Difficulty of Paying Living Expenses: Not hard at all  Food Insecurity: No Food Insecurity (07/15/2022)   Hunger Vital Sign    Worried About Running Out of Food in the Last Year: Never true    Ran Out of Food in the Last Year: Never true  Transportation Needs: No Transportation Needs (07/15/2022)   PRAPARE - Hydrologist (Medical): No    Lack of Transportation (Non-Medical): No  Physical Activity: Inactive (07/15/2022)   Exercise Vital Sign    Days of Exercise per Week: 0 days    Minutes of Exercise per Session: 0 min  Stress: No Stress Concern Present (07/15/2022)   Harmony    Feeling of Stress : Not at all  Social Connections: [place] (07/15/2022)   Social Connection and Isolation Panel [NHANES]    Frequency of  Communication with Friends and Family: More than three times a week    Frequency of Social Gatherings with Friends and Family: Once a week    Attends Religious Services: More than 4 times per year    Active Member of [employer] or Organizations: Yes    Attends Music therapist: More than 4 times per year    Marital Status: Married    Tobacco Counseling Ready to quit: Not Answered Counseling given: Not Answered Tobacco comments: 2 to 3 per day   Clinical Intake:  Pre-visit preparation completed: Yes  Pain : No/denies pain  Nutritional Status: BMI 25 -29 Overweight Nutritional Risks: None Diabetes: No  How often do you need to have someone help you when you read instructions, pamphlets, or other written materials from your doctor or pharmacy?: 1 - Never What is the last grade level you completed in school?: some college  Diabetic?no  Interpreter Needed?: No  Information entered by :: nikki m,cma   Activities of Daily Living    07/15/2022   10:56 AM  In your present state of health, do you have any difficulty performing the following activities:  Hearing? 0  Vision? 0  Difficulty concentrating or making decisions? 0  Walking or climbing stairs? 1  Dressing or bathing? 0  Doing errands, shopping? 0  Preparing Food and eating ? N  Using the Toilet? N  In the past six months, have you accidently leaked urine? N  Do you have problems with loss of bowel control? N  Managing your Medications? N  Managing your Finances? N  Housekeeping or managing your Housekeeping? N    Patient Care Team: Fenton Foy, NP as PCP - General (Pulmonary Disease) Nahser, Wonda Cheng, MD as PCP - Cardiology (Cardiology)  Indicate any recent Medical Services you may have received from other than [facility] providers in the past year (date may be approximate).     Assessment:   This is a routine wellness examination for Karen Ochoa.  Hearing/Vision screen No results  found.  Dietary issues and exercise activities discussed: Current Exercise Habits: The patient does not participate in regular exercise at present, Exercise limited by: None identified   Goals Addressed   None   Depression Screen    07/15/2022   10:55 AM 11/05/2021    3:10 PM 08/05/2021   12:05 PM 04/01/2021   12:44 PM 12/26/2020    1:15 PM 10/03/2019   11:28 AM 07/04/2019    8:32 AM  PHQ 2/9 Scores  PHQ - 2 Score 0 0 0 2 0 0 0  PHQ- 9 Score    4       Fall Risk    07/15/2022   10:56 AM 11/05/2021    3:10 PM 08/05/2021   12:05 PM 04/01/2021   12:44 PM 12/26/2020    1:22 PM  Fall Risk   Falls in the past year? 0 0  0 0  Number falls in past yr: 0 0 0 0 0  Injury with Fall? 0 0 0 0 0  Risk for fall due to : No Fall Risks No Fall Risks   No Fall Risks  Follow up Falls evaluation completed Falls evaluation completed   Education provided    FALL RISK PREVENTION PERTAINING TO THE HOME:  Any stairs in or around the home? No  If so, are there any without handrails? No  Home free of loose throw rugs in walkways, pet beds, electrical cords, etc? No  Adequate lighting in your home to reduce risk of falls? Yes   ASSISTIVE DEVICES UTILIZED TO PREVENT FALLS:  Life alert? No  Use of a cane, walker or w/c? Yes - cane Grab bars in the bathroom? No  Shower chair or bench in shower? No  Elevated toilet seat or a handicapped toilet? No   TIMED UP AND GO:  Was the test performed? No .  Length of time to ambulate 10 feet: n/a sec.     Cognitive Function:    12/26/2020    1:25 PM  MMSE - Mini Mental State Exam  Orientation to time  5  Orientation to Place 5  Registration 3  Attention/ Calculation 5  Recall 3  Language- name 2 objects 2  Language- repeat 1  Language- follow 3 step command 3  Language- read & follow direction 1  Write a sentence 1  Copy design 1  Total score 30        07/15/2022   10:57 AM  6CIT Screen  What Year? 0 points  What month? 0 points  What  time? 0 points  Count back from 20 0 points  Months in reverse 0 points  Repeat phrase 0 points  Total Score 0 points    Immunizations Immunization History  Administered Date(s) Administered   PFIZER(Purple Top)SARS-COV-2 Vaccination 10/12/2019, 11/06/2019, 07/23/2020   Pneumococcal Polysaccharide-23 08/04/2017   Tdap 09/06/2017    TDAP status: Up to date  Flu Vaccine status: Due, Education has been provided regarding the importance of this vaccine. Advised may receive this vaccine at local pharmacy or Health Dept. Aware to provide a copy of the vaccination record if obtained from local pharmacy or Health Dept. Verbalized acceptance and understanding.  Pneumococcal vaccine status: Due, Education has been provided regarding the importance of this vaccine. Advised may receive this vaccine at local pharmacy or Health Dept. Aware to provide a copy of the vaccination record if obtained from local pharmacy or Health Dept. Verbalized acceptance and understanding.  Covid-19 vaccine status: Completed vaccines  Qualifies for Shingles Vaccine? Yes   Zostavax completed No   Shingrix Completed?: No.    Education has been provided regarding the importance of this vaccine. Patient has been advised to call insurance company to determine out of pocket expense if they have not yet received this vaccine. Advised may also receive vaccine at local pharmacy or Health Dept. Verbalized acceptance and understanding.  Screening Tests Health Maintenance  Topic Date Due   COLONOSCOPY (Pts 45-79yr Insurance coverage will need to be confirmed)  Never done   PAP SMEAR-Modifier  05/21/2011   MAMMOGRAM  Never done   Zoster Vaccines- Shingrix (1 of 2) Never done   COVID-19 Vaccine (4 - 2023-24 season) 03/20/2022   Medicare Annual Wellness (AWV)  07/16/2023   DTaP/Tdap/Td (2 - Td or Tdap) 09/07/2027   Hepatitis C Screening  Completed   HIV Screening  Completed   HPV VACCINES  Aged Out    Health  Maintenance  Health Maintenance Due  Topic Date Due   COLONOSCOPY (Pts 45-423yrInsurance coverage will need to be confirmed)  Never done   PAP SMEAR-Modifier  05/21/2011   MAMMOGRAM  Never done   Zoster Vaccines- Shingrix (1 of 2) Never done   COVID-19 Vaccine (4 - 2023-24 season) 03/20/2022    Colorectal cancer screening: Referral to GI placed 07/15/22. Pt aware the office will call re: appt.  Mammogram status: Completed Scheduled 08/11/22. Repeat every year  Bone Density status: Ordered 07/15/22. Pt provided with contact info and advised to call to schedule appt.  Lung Cancer Screening: (Low Dose CT Chest recommended if Age 58-80ears, 30 pack-year currently smoking OR have quit w/in 15years.) does qualify.   Lung Cancer Screening Referral: 07/15/22  Additional Screening:  Hepatitis C Screening: does qualify; Completed 09/16/17   Vision Screening: Recommended annual ophthalmology exams for early detection of glaucoma and other disorders of the eye. Is the patient up to date with their annual eye exam?  Yes  Who is the provider or what is the name of the office in which the patient attends annual eye  exams? [facility] If pt is not established with a provider, would they like to be referred to a provider to establish care? No .   Dental Screening: Recommended annual dental exams for proper oral hygiene  Community Resource Referral / Chronic Care Management: CRR required this visit?  No   CCM required this visit?  No      Plan:     I have personally reviewed and noted the following in the patient's chart:   Medical and social history Use of alcohol, tobacco or illicit drugs  Current medications and supplements including opioid prescriptions. Patient is not currently taking opioid prescriptions. Functional ability and status Nutritional status Physical activity Advanced directives List of other physicians Hospitalizations, surgeries, and ER visits in previous 12  months Vitals Screenings to include cognitive, depression, and falls Referrals and appointments  In addition, I have reviewed and discussed with patient certain preventive protocols, quality metrics, and best practice recommendations. A written personalized care plan for preventive services as well as general preventive health recommendations were provided to patient.     Angus Seller, CMA   07/15/2022   Nurse Notes: Non face to face minutes spent 30. Discussed increasing activity as she wants to lose weight. Will discuss medication at next visit. She needs referrals for colonoscopy, lung screen and Dexa. Place this encounter.    I have reviewed and agree with above annual wellness documentation  Attestation signed by Fenton Foy at 08/05/22  I have reviewed and agree with above annual wellness documentation  Attestation signed by Fenton Foy at 08/05/22

## 2022-07-23 ENCOUNTER — Other Ambulatory Visit: Payer: Self-pay | Admitting: Nurse Practitioner

## 2022-07-23 DIAGNOSIS — I10 Essential (primary) hypertension: Secondary | ICD-10-CM

## 2022-08-04 ENCOUNTER — Other Ambulatory Visit: Payer: Self-pay

## 2022-08-04 DIAGNOSIS — I10 Essential (primary) hypertension: Secondary | ICD-10-CM

## 2022-08-04 MED ORDER — AMLODIPINE BESYLATE 10 MG PO TABS: 10.0000 mg | ORAL_TABLET | Freq: Every day | ORAL | 1 refills | Status: DC

## 2022-08-11 ENCOUNTER — Ambulatory Visit: Payer: Medicare Other

## 2022-09-04 ENCOUNTER — Other Ambulatory Visit: Payer: Self-pay | Admitting: Nurse Practitioner

## 2022-09-04 DIAGNOSIS — I10 Essential (primary) hypertension: Secondary | ICD-10-CM

## 2022-09-04 DIAGNOSIS — I25118 Atherosclerotic heart disease of native coronary artery with other forms of angina pectoris: Secondary | ICD-10-CM

## 2022-10-01 ENCOUNTER — Ambulatory Visit: Payer: Self-pay | Admitting: Nurse Practitioner

## 2022-10-02 ENCOUNTER — Encounter: Payer: Self-pay | Admitting: Nurse Practitioner

## 2022-10-02 ENCOUNTER — Ambulatory Visit (INDEPENDENT_AMBULATORY_CARE_PROVIDER_SITE_OTHER): Payer: 59 | Admitting: Nurse Practitioner

## 2022-10-02 VITALS — BP 130/76 | HR 73 | Temp 97.1°F | Ht 67.0 in | Wt 188.8 lb

## 2022-10-02 DIAGNOSIS — W19XXXA Unspecified fall, initial encounter: Secondary | ICD-10-CM | POA: Diagnosis not present

## 2022-10-02 DIAGNOSIS — R413 Other amnesia: Secondary | ICD-10-CM | POA: Diagnosis not present

## 2022-10-02 DIAGNOSIS — R55 Syncope and collapse: Secondary | ICD-10-CM

## 2022-10-02 DIAGNOSIS — G4700 Insomnia, unspecified: Secondary | ICD-10-CM

## 2022-10-02 DIAGNOSIS — Z8673 Personal history of transient ischemic attack (TIA), and cerebral infarction without residual deficits: Secondary | ICD-10-CM | POA: Diagnosis not present

## 2022-10-02 NOTE — Progress Notes (Unsigned)
@Patient  ID: Karen Ochoa, female    DOB: Dec 02, 1965, 57 y.o.   MRN: [id number]  Chief Complaint  Patient presents with   [facility] [address] tooth was knocked out per pt she feels off balance    Referring provider: Fenton Foy, NP   HPI  Patient presents today due to recent fall.  She states that she has been slightly but usually catches herself.  This time she fell outside in her driveway.  She does not remember the fall and does not remember anything until the next day when she went to a friend's house and they told her she was missing her front tooth.  She has an appointment with a dentist set up.  She states is a possibility she could have slipped in the mud.  Patient states that she is wondering if her blood sugars may have been low because she only eats 1 meal a day.  We discussed that she does need to eat 6 small meals per day to keep her blood sugar level stable.  Patient does have a history of TIA she does have a history of presyncopal episodes.  She does have insomnia.  We will refer her to neurology for further evaluation. Denies f/c/s, n/v/d, hemoptysis, PND, leg swelling Denies chest pain or edema    May have slid in mud  Only eats once daily    Allergies  Allergen Reactions   Penicillins Anaphylaxis and Other (See Comments)    Has patient had a PCN reaction causing immediate rash, facial/tongue/throat swelling, SOB or lightheadedness with hypotension: Yes Has patient had a PCN reaction causing severe rash involving mucus membranes or skin necrosis: Yes Has patient had a PCN reaction that required hospitalization Yes Has patient had a PCN reaction occurring within the last 10 years: No If all of the above answers are "NO", then may proceed with Cephalosporin use.    Sulfonamide Derivatives Anaphylaxis   Clonidine Derivatives Other (See Comments)    Patient passed out shortly after ingesting this at a doctor's office on 05/08/16 (was taken in conjunction with a  tablet of Labetalol.)   Flagyl [Metronidazole] Swelling and Other (See Comments)    Skin blisters   Labetalol Other (See Comments)    Patient passed out shortly after ingesting this at a doctor's office on 05/08/16 (was taken in conjunction with a tablet of Clonidine.)    Immunization History  Administered Date(s) Administered   PFIZER(Purple Top)SARS-COV-2 Vaccination 10/12/2019, 11/06/2019, 07/23/2020   Pneumococcal Polysaccharide-23 08/04/2017   Tdap 09/06/2017    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   Fibroids    GERD (gastroesophageal reflux disease)    Headache    "weekly" (07/20/2017)   HLD (hyperlipidemia) 07/18/2017   Hx MRSA infection    Hypertension    Insomnia due to medical condition    Ischemic cardiomyopathy    Stroke ([place]) 07/18/2017   TIA (transient ischemic attack)    "I've had several"    Tobacco History: Social History   Tobacco Use  Smoking Status Every Day   Packs/day: 1.00   Years: 10.00   Additional pack years: 0.00   Total pack years: 10.00   Types: Cigarettes  Smokeless Tobacco Never  Tobacco Comments   2 to 3 per day   Ready to quit: Not Answered Counseling given: Not Answered Tobacco comments: 2 to 3 per day  Outpatient Encounter Medications as of 10/02/2022  Medication Sig   albuterol (VENTOLIN HFA) 108 (90 Base) MCG/ACT inhaler INHALE 2 PUFFS INTO THE LUNGS EVERY 6 HOURS AS NEEDED FOR WHEEZING OR SHORTNESS OF BREATH   amLODipine (NORVASC) 10 MG tablet Take 1 tablet (10 mg total) by mouth daily.   carvedilol (COREG) 25 MG tablet TAKE 1 TABLET BY MOUTH TWICE  DAILY WITH A MEAL   famotidine (PEPCID) 20 MG tablet Take 1 tablet (20 mg total) by mouth 2 (two) times daily.   levocetirizine (XYZAL) 5 MG tablet Take 1 tablet (5 mg total) by mouth every evening.   linaclotide (LINZESS) 72 MCG capsule TAKE 1 CAPSULE BY MOUTH DAILY  BEFORE BREAKFAST   losartan (COZAAR) 25 MG tablet  TAKE TAKE 1 TABLET BY MOUTH  DAILY   rosuvastatin (CRESTOR) 10 MG tablet Take 1 tablet (10 mg total) by mouth daily.   traZODone (DESYREL) 150 MG tablet Take 1 tablet (150 mg total) by mouth at bedtime as needed for sleep.   No facility-administered encounter medications on file as of 10/02/2022.     Review of Systems  Review of Systems  Constitutional: Negative.   HENT: Negative.    Cardiovascular: Negative.   Gastrointestinal: Negative.   Allergic/Immunologic: Negative.   Neurological: Negative.   Psychiatric/Behavioral:  Positive for confusion.        Physical Exam  BP 130/76   Pulse 73   Temp (!) 97.1 F (36.2 C)   Ht 5\' 7"  (1.702 m)   Wt 188 lb 12.8 oz (85.6 kg)   LMP 05/19/2013   SpO2 100%   BMI 29.57 kg/m   Wt Readings from Last 5 Encounters:  10/02/22 188 lb 12.8 oz (85.6 kg)  07/15/22 182 lb (82.6 kg)  06/17/22 183 lb (83 kg)  11/05/21 175 lb 6 oz (79.5 kg)  09/05/21 181 lb (82.1 kg)     Physical Exam Vitals and nursing note reviewed.  Constitutional:      General: She is not in acute distress.    Appearance: She is well-developed.  Cardiovascular:     Rate and Rhythm: Normal rate and regular rhythm.  Pulmonary:     Effort: Pulmonary effort is normal.     Breath sounds: Normal breath sounds.  Neurological:     Mental Status: She is alert and oriented to person, place, and time.      Lab Results:  CBC    Component Value Date/Time   WBC 7.0 10/02/2022 1558   WBC 9.3 10/14/2017 0347   RBC 4.15 10/02/2022 1558   RBC 2.88 (L) 10/14/2017 0347   HGB 13.2 10/02/2022 1558   HCT 37.9 10/02/2022 1558   PLT 164 10/02/2022 1558   MCV 91 10/02/2022 1558   MCH 31.8 10/02/2022 1558   MCH 28.8 10/14/2017 0347   MCHC 34.8 10/02/2022 1558   MCHC 31.7 10/14/2017 0347   RDW 13.9 10/02/2022 1558   LYMPHSABS 2.3 08/04/2017 1406   MONOABS 0.5 07/18/2017 1850   EOSABS 0.1 08/04/2017 1406   BASOSABS 0.0 08/04/2017 1406    BMET    Component Value  Date/Time   NA 141 10/02/2022 1558   K 3.8 10/02/2022 1558   CL 103 10/02/2022 1558   CO2 19 (L) 10/02/2022 1558   GLUCOSE 112 (H) 10/02/2022 1558   GLUCOSE 115 (H) 10/14/2017 0347   BUN 7 10/02/2022 1558   CREATININE 1.04 (H) 10/02/2022 1558   CALCIUM 8.5 (L) 10/02/2022 1558   GFRNONAA 45 (L)  07/30/2020 1350   GFRAA 52 (L) 07/30/2020 1350    BNP    Component Value Date/Time   BNP 306.8 (H) 07/20/2017 1646    ProBNP No results found for: "PROBNP"  Imaging: No results found.   Assessment & Plan:   Insomnia - Ambulatory referral to Neurology  2. Fall, initial encounter  - Ambulatory referral to Neurology  3. Pre-syncope  - Ambulatory referral to Neurology  4. History of TIAs  - Ambulatory referral to Neurology  5. Memory loss  - Ambulatory referral to Neurology   Follow up:  Follow up in 1 months - falls     Fenton Foy, NP 10/05/2022

## 2022-10-02 NOTE — Patient Instructions (Addendum)
1. Insomnia, unspecified type  - Ambulatory referral to Neurology  2. Fall, initial encounter  - Ambulatory referral to Neurology  3. Pre-syncope  - Ambulatory referral to Neurology  4. History of TIAs  - Ambulatory referral to Neurology  5. Memory loss  - Ambulatory referral to Neurology   Follow up:  Follow up in 1 months - falls

## 2022-10-03 LAB — COMPREHENSIVE METABOLIC PANEL
ALT: 40 IU/L — ABNORMAL HIGH (ref 0–32)
AST: 59 IU/L — ABNORMAL HIGH (ref 0–40)
Albumin/Globulin Ratio: 1.7 (ref 1.2–2.2)
Albumin: 4.5 g/dL (ref 3.8–4.9)
Alkaline Phosphatase: 153 IU/L — ABNORMAL HIGH (ref 44–121)
BUN/Creatinine Ratio: 7 — ABNORMAL LOW (ref 9–23)
BUN: 7 mg/dL (ref 6–24)
Bilirubin Total: 0.4 mg/dL (ref 0.0–1.2)
CO2: 19 mmol/L — ABNORMAL LOW (ref 20–29)
Calcium: 8.5 mg/dL — ABNORMAL LOW (ref 8.7–10.2)
Chloride: 103 mmol/L (ref 96–106)
Creatinine, Ser: 1.04 mg/dL — ABNORMAL HIGH (ref 0.57–1.00)
Globulin, Total: 2.7 g/dL (ref 1.5–4.5)
Glucose: 112 mg/dL — ABNORMAL HIGH (ref 70–99)
Potassium: 3.8 mmol/L (ref 3.5–5.2)
Sodium: 141 mmol/L (ref 134–144)
Total Protein: 7.2 g/dL (ref 6.0–8.5)
eGFR: 62 mL/min/{1.73_m2} (ref >59)

## 2022-10-03 LAB — CBC
Hematocrit: 37.9 % (ref 34.0–46.6)
Hemoglobin: 13.2 g/dL (ref 11.1–15.9)
MCH: 31.8 pg (ref 26.6–33.0)
MCHC: 34.8 g/dL (ref 31.5–35.7)
MCV: 91 fL (ref 79–97)
Platelets: 164 10*3/uL (ref 150–450)
RBC: 4.15 x10E6/uL (ref 3.77–5.28)
RDW: 13.9 % (ref 11.7–15.4)
WBC: 7 10*3/uL (ref 3.4–10.8)

## 2022-10-05 ENCOUNTER — Encounter: Payer: Self-pay | Admitting: Nurse Practitioner

## 2022-10-05 DIAGNOSIS — G4700 Insomnia, unspecified: Secondary | ICD-10-CM | POA: Insufficient documentation

## 2022-10-05 NOTE — Assessment & Plan Note (Signed)
-   Ambulatory referral to Neurology  2. Fall, initial encounter  - Ambulatory referral to Neurology  3. Pre-syncope  - Ambulatory referral to Neurology  4. History of TIAs  - Ambulatory referral to Neurology  5. Memory loss  - Ambulatory referral to Neurology   Follow up:  Follow up in 1 months - falls

## 2022-10-14 ENCOUNTER — Telehealth: Payer: Self-pay

## 2022-10-15 NOTE — Telephone Encounter (Signed)
Called pt and she would like to be referrer to weigh and wellness clinic. Thanks

## 2022-10-19 ENCOUNTER — Other Ambulatory Visit: Payer: Self-pay | Admitting: Nurse Practitioner

## 2022-10-19 DIAGNOSIS — E6609 Other obesity due to excess calories: Secondary | ICD-10-CM

## 2022-10-22 ENCOUNTER — Other Ambulatory Visit: Payer: Self-pay | Admitting: Nurse Practitioner

## 2022-10-22 DIAGNOSIS — K219 Gastro-esophageal reflux disease without esophagitis: Secondary | ICD-10-CM

## 2022-11-04 ENCOUNTER — Ambulatory Visit: Payer: 59 | Admitting: Nurse Practitioner

## 2022-11-13 ENCOUNTER — Telehealth: Payer: Self-pay

## 2022-11-13 NOTE — Telephone Encounter (Signed)
Called pt to see if she would like to be scheduled for a mammo.  No answer and lvm for pt to call back. KH

## 2022-11-16 ENCOUNTER — Other Ambulatory Visit: Payer: Self-pay | Admitting: Nurse Practitioner

## 2022-11-16 DIAGNOSIS — I10 Essential (primary) hypertension: Secondary | ICD-10-CM

## 2022-11-16 DIAGNOSIS — I25118 Atherosclerotic heart disease of native coronary artery with other forms of angina pectoris: Secondary | ICD-10-CM

## 2022-11-20 ENCOUNTER — Telehealth: Payer: Self-pay

## 2022-11-23 NOTE — Telephone Encounter (Signed)
Called pt and left a message with this information. Gh

## 2022-11-27 ENCOUNTER — Other Ambulatory Visit: Payer: Self-pay | Admitting: Nurse Practitioner

## 2022-11-27 DIAGNOSIS — I10 Essential (primary) hypertension: Secondary | ICD-10-CM

## 2022-11-28 ENCOUNTER — Other Ambulatory Visit: Payer: Self-pay | Admitting: Nurse Practitioner

## 2022-11-28 DIAGNOSIS — G4700 Insomnia, unspecified: Secondary | ICD-10-CM

## 2022-11-28 DIAGNOSIS — K581 Irritable bowel syndrome with constipation: Secondary | ICD-10-CM

## 2022-11-30 NOTE — Telephone Encounter (Signed)
Please advise KH 

## 2022-12-10 ENCOUNTER — Inpatient Hospital Stay: Admission: RE | Admit: 2022-12-10 | Payer: 59 | Source: Ambulatory Visit

## 2022-12-16 ENCOUNTER — Ambulatory Visit: Payer: Self-pay | Admitting: Nurse Practitioner

## 2022-12-17 ENCOUNTER — Ambulatory Visit: Payer: Medicaid Other | Admitting: Neurology

## 2022-12-19 HISTORY — PX: DENTAL SURGERY: SHX609

## 2022-12-23 ENCOUNTER — Ambulatory Visit: Payer: 59 | Admitting: Nurse Practitioner

## 2022-12-24 ENCOUNTER — Ambulatory Visit: Payer: 59 | Admitting: Nurse Practitioner

## 2022-12-25 ENCOUNTER — Telehealth: Payer: Self-pay | Admitting: Nurse Practitioner

## 2022-12-25 NOTE — Telephone Encounter (Signed)
Caller & Relationship to patient:  MRN #  [id number]   Call Back Number:   Date of Last Office Visit: 12/23/2022     Date of Next Office Visit: Visit date not found    Medication(s) to be Refilled: [email or web address]  Preferred Pharmacy:   ** Please notify patient to allow 48-72 hours to process** **Let patient know to contact pharmacy at the end of the day to make sure medication is ready. ** **If patient has not been seen in a year or longer, book an appointment **Advise to use MyChart for refill requests OR to contact their pharmacy

## 2022-12-28 ENCOUNTER — Other Ambulatory Visit: Payer: Self-pay

## 2022-12-28 ENCOUNTER — Other Ambulatory Visit: Payer: Self-pay | Admitting: Nurse Practitioner

## 2022-12-28 DIAGNOSIS — J302 Other seasonal allergic rhinitis: Secondary | ICD-10-CM

## 2022-12-28 MED ORDER — LEVOCETIRIZINE DIHYDROCHLORIDE 5 MG PO TABS: 5.0000 mg | ORAL_TABLET | Freq: Every evening | ORAL | 2 refills | Status: DC

## 2022-12-31 ENCOUNTER — Ambulatory Visit: Payer: Medicaid Other | Admitting: Neurology

## 2023-01-15 ENCOUNTER — Ambulatory Visit (HOSPITAL_COMMUNITY)
Admission: EM | Admit: 2023-01-15 | Discharge: 2023-01-15 | Disposition: A | Discharge status: Home / Self Care | Payer: 59

## 2023-01-15 ENCOUNTER — Encounter (HOSPITAL_COMMUNITY): Payer: Self-pay

## 2023-01-15 DIAGNOSIS — Z0130 Encounter for examination of blood pressure without abnormal findings: Secondary | ICD-10-CM

## 2023-01-15 NOTE — Discharge Instructions (Signed)
Your blood pressure is normal today. Your blood pressure 120/82.

## 2023-01-15 NOTE — ED Provider Notes (Signed)
[facility]    CSN: [id number] Arrival date & time: 01/15/23  1605      History   Chief Complaint Chief Complaint  Patient presents with   Headache   Fatigue    HPI Karen Ochoa is a 57 y.o. female.   HPI   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   Fibroids    GERD (gastroesophageal reflux disease)    Headache    "weekly" (07/20/2017)   HLD (hyperlipidemia) 07/18/2017   Hx MRSA infection    Hypertension    Insomnia due to medical condition    Ischemic cardiomyopathy    Stroke (HCC) 07/18/2017   TIA (transient ischemic attack)    "I've had several"    Patient Active Problem List   Diagnosis Date Noted   Insomnia 10/05/2022   Colon cancer screening 06/17/2022   Screening examination for STD (sexually transmitted disease) 11/05/2021   Encounter for Medicare annual wellness exam 12/26/2020   Hyperlipidemia with target LDL less than 70 03/29/2018   Ischemic cardiomyopathy 12/08/2017   History of CVA (cerebrovascular accident) 12/08/2017   S/P CABG x 5 10/07/2017   Coronary artery disease 10/06/2017   CAD (coronary artery disease) 10/05/2017   Coronary artery disease of native artery of native heart with stable angina pectoris (HCC)    Cardiomyopathy- etioloogy not yet determined, suspect HTN CM 07/21/2017   Chronic renal insufficiency, stage III (moderate) (HCC) 07/21/2017   Carotid artery disease (HCC) 07/21/2017   Chronic systolic CHF (congestive heart failure) (HCC)    White matter abnormality on MRI of brain    Cerebrovascular accident (CVA) (HCC) 07/19/2017   Renal insufficiency 07/19/2017   Current every day smoker 06/18/2009   History of syncope 2010 06/18/2009   INSOMNIA, CHRONIC 05/27/2009   HTN (hypertension) 05/27/2009   History of TIAs 05/27/2009   HEADACHE, CHRONIC, HX OF 05/27/2009    Past Surgical History:  Procedure Laterality Date   CORONARY ARTERY BYPASS GRAFT N/A  10/07/2017   Procedure: CORONARY ARTERY BYPASS GRAFTING (CABG) ON PUMP TIMES 5 USING LEFT INTERNAL MAMMARY ARTERY AND BILATERAL GREATER SAPHENOUS VEIN VIA ENDOHARDEST.;  Surgeon: Alleen Borne, MD;  Location: [facility] OR;  Service: Open Heart Surgery;  Laterality: N/A;   DENTAL SURGERY  12/2022   RIGHT/LEFT HEART CATH AND CORONARY ANGIOGRAPHY N/A 10/05/2017   Procedure: RIGHT/LEFT HEART CATH AND CORONARY ANGIOGRAPHY;  Surgeon: Lyn Records, MD;  Location: [facility];  Service: Cardiovascular;  Laterality: N/A;   TEE WITHOUT CARDIOVERSION N/A 10/07/2017   Procedure: TRANSESOPHAGEAL ECHOCARDIOGRAM (TEE);  Surgeon: Alleen Borne, MD;  Location: [facility] OR;  Service: Open Heart Surgery;  Laterality: N/A;   WISDOM TOOTH EXTRACTION      OB History     Gravida  4   Para  0   Term  0   Preterm  0   AB  4   Living         SAB  4   IAB  0   Ectopic  0   Multiple      Live Births               Home Medications    Prior to Admission medications   Medication Sig Start Date End Date Taking? Authorizing Provider  amLODipine (NORVASC) 10 MG tablet TAKE 1 TABLET BY MOUTH DAILY 11/30/22  Yes Ivonne Andrew,  NP  carvedilol (COREG) 25 MG tablet TAKE 1 TABLET BY MOUTH TWICE  DAILY WITH A MEAL 11/17/22  Yes Ivonne Andrew, NP  famotidine (PEPCID) 20 MG tablet TAKE 1 TABLET BY MOUTH TWICE  DAILY 10/23/22  Yes Ivonne Andrew, NP  ibuprofen (ADVIL) 800 MG tablet Take 800 mg by mouth every 6 (six) hours as needed for headache. 12/16/22  Yes [provider]  levocetirizine (XYZAL) 5 MG tablet Take 1 tablet (5 mg total) by mouth every evening. 12/28/22  Yes Ivonne Andrew, NP  LINZESS 72 MCG capsule TAKE 1 CAPSULE BY MOUTH DAILY  BEFORE BREAKFAST 11/30/22  Yes Ivonne Andrew, NP  losartan (COZAAR) 25 MG tablet TAKE TAKE 1 TABLET BY MOUTH  DAILY 06/17/22  Yes Ivonne Andrew, NP  rosuvastatin (CRESTOR) 10 MG tablet TAKE 1 TABLET BY MOUTH DAILY 10/23/22  Yes Ivonne Andrew, NP   traZODone (DESYREL) 150 MG tablet TAKE 1 TABLET BY MOUTH AT  BEDTIME AS NEEDED FOR SLEEP 11/30/22  Yes Ivonne Andrew, NP  albuterol (VENTOLIN HFA) 108 (90 Base) MCG/ACT inhaler INHALE 2 PUFFS INTO THE LUNGS EVERY 6 HOURS AS NEEDED FOR WHEEZING OR SHORTNESS OF BREATH 09/26/19   Massie Maroon, FNP    Family History Family History  Problem Relation Age of Onset   Hypertension Mother    Diabetes Sister    Asthma Brother    Diabetes Brother    Anesthesia problems Neg Hx     Social History Social History   Tobacco Use   Smoking status: Every Day    Packs/day: 1.00    Years: 10.00    Additional pack years: 0.00    Total pack years: 10.00    Types: Cigarettes   Smokeless tobacco: Never   Tobacco comments:    2 to 3 per day  Vaping Use   Vaping Use: Never used  Substance Use Topics   Alcohol use: No    Alcohol/week: 2.0 standard drinks of alcohol    Types: 2 Glasses of wine per week    Comment: 08/04/2017--states no    Drug use: No     Allergies   Penicillins, Sulfonamide derivatives, Clonidine derivatives, Flagyl [metronidazole], and Labetalol   Review of Systems Review of Systems   Physical Exam Triage Vital Signs ED Triage Vitals  Enc Vitals Group     BP 01/15/23 1612 120/82     Pulse Rate 01/15/23 1612 74     Resp 01/15/23 1612 16     Temp 01/15/23 1612 98.3 F (36.8 C)     Temp Source 01/15/23 1612 Oral     SpO2 01/15/23 1612 100 %     Weight 01/15/23 1714 183 lb (83 kg)     Height 01/15/23 1714 5\' 7"  (1.702 m)     Head Circumference --      Peak Flow --      Pain Score 01/15/23 1712 9     Pain Loc --      Pain Edu? --      Excl. in GC? --    No data found.  Updated Vital Signs BP 120/82 (BP Location: Right Arm)   Pulse 74   Temp 98.3 F (36.8 C) (Oral)   Resp 16   Ht 5\' 7"  (1.702 m)   Wt 183 lb (83 kg)   LMP 05/19/2013   SpO2 100%   BMI 28.66 kg/m   Visual Acuity Right Eye Distance:   Left Eye Distance:  Bilateral Distance:     Right Eye Near:   Left Eye Near:    Bilateral Near:     Physical Exam   UC Treatments / Results  Labs (all labs ordered are listed, but only abnormal results are displayed) Labs Reviewed - No data to display  EKG   Radiology No results found.  Procedures Procedures (including critical care time)  Medications Ordered in UC Medications - No data to display  Initial Impression / Assessment and Plan / UC Course  I have reviewed the triage vital signs and the nursing notes.  Pertinent labs & imaging results that were available during my care of the patient were reviewed by me and considered in my medical decision making (see chart for details).     *** Final Clinical Impressions(s) / UC Diagnoses   Final diagnoses:  None   Discharge Instructions   None    ED Prescriptions   None    PDMP not reviewed this encounter.

## 2023-01-15 NOTE — ED Triage Notes (Signed)
Patient reports she has been having a headache x 2 days and feeling tired . Patient states symptoms worse when she is outin the sun.  Patient reports a history of a stroke a couple of years ago and has left sided weakness. Patient denies any new deficits or weaknesses. Patient then said that she just wanted her BP checked. BP-120/82.

## 2023-01-25 ENCOUNTER — Other Ambulatory Visit: Payer: Self-pay | Admitting: Nurse Practitioner

## 2023-01-25 DIAGNOSIS — I10 Essential (primary) hypertension: Secondary | ICD-10-CM

## 2023-01-25 DIAGNOSIS — I25118 Atherosclerotic heart disease of native coronary artery with other forms of angina pectoris: Secondary | ICD-10-CM

## 2023-01-26 ENCOUNTER — Telehealth: Payer: Self-pay

## 2023-01-26 NOTE — Telephone Encounter (Signed)
Lvm for pt to call back to be scheduled for her mammo or she can [facility] to [facility] at [phone].  KH

## 2023-02-09 ENCOUNTER — Telehealth: Payer: Self-pay

## 2023-02-09 NOTE — Telephone Encounter (Signed)
Lvm for pt to call back to advise if she would like to have a mammo done here 8/6 or 9/3. If so she will need to be scheduled. KH

## 2023-02-18 ENCOUNTER — Encounter: Payer: Self-pay | Admitting: Nurse Practitioner

## 2023-02-18 ENCOUNTER — Ambulatory Visit (INDEPENDENT_AMBULATORY_CARE_PROVIDER_SITE_OTHER): Payer: 59 | Admitting: Nurse Practitioner

## 2023-02-18 VITALS — BP 125/89 | HR 72 | Temp 97.7°F | Wt 186.4 lb

## 2023-02-18 DIAGNOSIS — F32A Depression, unspecified: Secondary | ICD-10-CM | POA: Diagnosis not present

## 2023-02-18 DIAGNOSIS — J449 Chronic obstructive pulmonary disease, unspecified: Secondary | ICD-10-CM

## 2023-02-18 DIAGNOSIS — I25118 Atherosclerotic heart disease of native coronary artery with other forms of angina pectoris: Secondary | ICD-10-CM

## 2023-02-18 DIAGNOSIS — K581 Irritable bowel syndrome with constipation: Secondary | ICD-10-CM

## 2023-02-18 DIAGNOSIS — I10 Essential (primary) hypertension: Secondary | ICD-10-CM | POA: Diagnosis not present

## 2023-02-18 DIAGNOSIS — G4700 Insomnia, unspecified: Secondary | ICD-10-CM

## 2023-02-18 DIAGNOSIS — R7301 Impaired fasting glucose: Secondary | ICD-10-CM | POA: Diagnosis not present

## 2023-02-18 DIAGNOSIS — Z13220 Encounter for screening for lipoid disorders: Secondary | ICD-10-CM | POA: Diagnosis not present

## 2023-02-18 DIAGNOSIS — F419 Anxiety disorder, unspecified: Secondary | ICD-10-CM

## 2023-02-18 DIAGNOSIS — K219 Gastro-esophageal reflux disease without esophagitis: Secondary | ICD-10-CM

## 2023-02-18 DIAGNOSIS — R630 Anorexia: Secondary | ICD-10-CM

## 2023-02-18 DIAGNOSIS — Z1211 Encounter for screening for malignant neoplasm of colon: Secondary | ICD-10-CM

## 2023-02-18 MED ORDER — ALBUTEROL SULFATE HFA 108 (90 BASE) MCG/ACT IN AERS
INHALATION_SPRAY | RESPIRATORY_TRACT | 3 refills | Status: AC
Start: 2023-02-18 — End: ?

## 2023-02-18 MED ORDER — LINACLOTIDE 72 MCG PO CAPS: 72.0000 ug | ORAL_CAPSULE | Freq: Every day | ORAL | 3 refills | Status: DC

## 2023-02-18 MED ORDER — AMLODIPINE BESYLATE 10 MG PO TABS: 10.0000 mg | ORAL_TABLET | Freq: Every day | ORAL | 2 refills | Status: DC

## 2023-02-18 MED ORDER — TRAZODONE HCL 150 MG PO TABS
150.0000 mg | ORAL_TABLET | Freq: Every evening | ORAL | 3 refills | Status: DC | PRN
Start: 2023-02-18 — End: 2023-12-23

## 2023-02-18 MED ORDER — IBUPROFEN 800 MG PO TABS: 800.0000 mg | ORAL_TABLET | Freq: Four times a day (QID) | ORAL | 2 refills | Status: AC | PRN

## 2023-02-18 MED ORDER — FAMOTIDINE 20 MG PO TABS: 20.0000 mg | ORAL_TABLET | Freq: Two times a day (BID) | ORAL | 1 refills | Status: DC

## 2023-02-18 MED ORDER — CARVEDILOL 25 MG PO TABS
25.0000 mg | ORAL_TABLET | Freq: Two times a day (BID) | ORAL | 2 refills | Status: DC
Start: 2023-02-18 — End: 2023-10-28

## 2023-02-18 MED ORDER — ROSUVASTATIN CALCIUM 10 MG PO TABS: 10.0000 mg | ORAL_TABLET | Freq: Every day | ORAL | 1 refills | Status: DC

## 2023-02-18 MED ORDER — MEGESTROL ACETATE 20 MG PO TABS: 20.0000 mg | ORAL_TABLET | Freq: Every day | ORAL | 0 refills | Status: AC

## 2023-02-18 MED ORDER — LOSARTAN POTASSIUM 25 MG PO TABS
ORAL_TABLET | ORAL | 3 refills | Status: DC
Start: 2023-02-18 — End: 2024-01-12

## 2023-02-18 NOTE — Progress Notes (Signed)
@Patient  ID: Karen Ochoa, female    DOB: October 20, 1965, 57 y.o.   MRN: [id number]  Chief Complaint  Patient presents with   Follow-up    Lack of appetite can go at least 2 days without eating     Referring provider: Ivonne Andrew, NP   HPI  Karen Ochoa is a very pleasant 57 year old female with a medical history significant for essential hypertension, coronary artery disease, history of chronic systolic CHF, tobacco dependence, and history of CVA    Patient presents today for a follow up on hypertension. States that she does need medication refills. She states that she has been doing well. Follows a low salt diet, but is very sedentary.  Patient states that over the last few months she has had loss of appetite.  Her weight does remain stable.  She states that she has not eaten anything since yesterday.  She states that her stomach growls like she is hungry but she cannot make herself eat anything.  We will place a referral to GI for further evaluation does need colon cancer screening as well.  Denies f/c/s, n/v/d, hemoptysis, PND, leg swelling Denies chest pain or edema      Allergies  Allergen Reactions   Penicillins Anaphylaxis and Other (See Comments)    Has patient had a PCN reaction causing immediate rash, facial/tongue/throat swelling, SOB or lightheadedness with hypotension: Yes Has patient had a PCN reaction causing severe rash involving mucus membranes or skin necrosis: Yes Has patient had a PCN reaction that required hospitalization Yes Has patient had a PCN reaction occurring within the last 10 years: No If all of the above answers are "NO", then may proceed with Cephalosporin use.    Sulfonamide Derivatives Anaphylaxis   Clonidine Derivatives Other (See Comments)    Patient passed out shortly after ingesting this at a doctor's office on 05/08/16 (was taken in conjunction with a tablet of Labetalol.)   Flagyl [Metronidazole] Swelling and Other (See Comments)     Skin blisters   Labetalol Other (See Comments)    Patient passed out shortly after ingesting this at a doctor's office on 05/08/16 (was taken in conjunction with a tablet of Clonidine.)    Immunization History  Administered Date(s) Administered   PFIZER(Purple Top)SARS-COV-2 Vaccination 10/12/2019, 11/06/2019, 07/23/2020   Pneumococcal Polysaccharide-23 08/04/2017   Tdap 09/06/2017    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   Fibroids    GERD (gastroesophageal reflux disease)    Headache    "weekly" (07/20/2017)   HLD (hyperlipidemia) 07/18/2017   Hx MRSA infection    Hypertension    Insomnia due to medical condition    Ischemic cardiomyopathy    Stroke (HCC) 07/18/2017   TIA (transient ischemic attack)    "I've had several"    Tobacco History: Social History   Tobacco Use  Smoking Status Every Day   Current packs/day: 1.00   Average packs/day: 1 pack/day for 10.0 years (10.0 ttl pk-yrs)   Types: Cigarettes  Smokeless Tobacco Never  Tobacco Comments   2 to 3 per day   Ready to quit: Not Answered Counseling given: Not Answered Tobacco comments: 2 to 3 per day   Outpatient Encounter Medications as of 02/18/2023  Medication Sig   megestrol (MEGACE) 20 MG tablet Take 1 tablet (20 mg total) by mouth daily.   [DISCONTINUED] albuterol (VENTOLIN HFA) 108 (90 Base)  MCG/ACT inhaler INHALE 2 PUFFS INTO THE LUNGS EVERY 6 HOURS AS NEEDED FOR WHEEZING OR SHORTNESS OF BREATH   [DISCONTINUED] amLODipine (NORVASC) 10 MG tablet TAKE 1 TABLET BY MOUTH DAILY   [DISCONTINUED] carvedilol (COREG) 25 MG tablet TAKE 1 TABLET BY MOUTH TWICE  DAILY WITH A MEAL   [DISCONTINUED] famotidine (PEPCID) 20 MG tablet TAKE 1 TABLET BY MOUTH TWICE  DAILY   [DISCONTINUED] ibuprofen (ADVIL) 800 MG tablet Take 800 mg by mouth every 6 (six) hours as needed for headache.   [DISCONTINUED] LINZESS 72 MCG capsule TAKE 1 CAPSULE BY MOUTH DAILY  BEFORE  BREAKFAST   [DISCONTINUED] losartan (COZAAR) 25 MG tablet TAKE TAKE 1 TABLET BY MOUTH  DAILY   [DISCONTINUED] rosuvastatin (CRESTOR) 10 MG tablet TAKE 1 TABLET BY MOUTH DAILY   [DISCONTINUED] traZODone (DESYREL) 150 MG tablet TAKE 1 TABLET BY MOUTH AT  BEDTIME AS NEEDED FOR SLEEP   albuterol (VENTOLIN HFA) 108 (90 Base) MCG/ACT inhaler INHALE 2 PUFFS INTO THE LUNGS EVERY 6 HOURS AS NEEDED FOR WHEEZING OR SHORTNESS OF BREATH   amLODipine (NORVASC) 10 MG tablet Take 1 tablet (10 mg total) by mouth daily.   carvedilol (COREG) 25 MG tablet Take 1 tablet (25 mg total) by mouth 2 (two) times daily with a meal.   famotidine (PEPCID) 20 MG tablet Take 1 tablet (20 mg total) by mouth 2 (two) times daily.   ibuprofen (ADVIL) 800 MG tablet Take 1 tablet (800 mg total) by mouth every 6 (six) hours as needed for headache.   levocetirizine (XYZAL) 5 MG tablet Take 1 tablet (5 mg total) by mouth every evening. (Patient not taking: Reported on 02/18/2023)   linaclotide (LINZESS) 72 MCG capsule Take 1 capsule (72 mcg total) by mouth daily before breakfast.   losartan (COZAAR) 25 MG tablet TAKE TAKE 1 TABLET BY MOUTH  DAILY   rosuvastatin (CRESTOR) 10 MG tablet Take 1 tablet (10 mg total) by mouth daily.   traZODone (DESYREL) 150 MG tablet Take 1 tablet (150 mg total) by mouth at bedtime as needed. for sleep   No facility-administered encounter medications on file as of 02/18/2023.     Review of Systems  Review of Systems  Constitutional: Negative.   HENT: Negative.    Cardiovascular: Negative.   Gastrointestinal: Negative.   Allergic/Immunologic: Negative.   Neurological: Negative.   Psychiatric/Behavioral: Negative.         Physical Exam  BP 125/89   Pulse 72   Temp 97.7 F (36.5 C) (Oral)   Wt 186 lb 6.4 oz (84.6 kg)   LMP 05/19/2013   SpO2 99%   BMI 29.19 kg/m   Wt Readings from Last 5 Encounters:  02/18/23 186 lb 6.4 oz (84.6 kg)  01/15/23 183 lb (83 kg)  10/02/22 188 lb 12.8 oz (85.6  kg)  07/15/22 182 lb (82.6 kg)  06/17/22 183 lb (83 kg)     Physical Exam Vitals and nursing note reviewed.  Constitutional:      General: She is not in acute distress.    Appearance: She is well-developed.  Cardiovascular:     Rate and Rhythm: Normal rate and regular rhythm.  Pulmonary:     Effort: Pulmonary effort is normal.     Breath sounds: Normal breath sounds.  Neurological:     Mental Status: She is alert and oriented to person, place, and time.      Lab Results:  CBC    Component Value Date/Time   WBC 7.0 10/02/2022  1558   WBC 9.3 10/14/2017 0347   RBC 4.15 10/02/2022 1558   RBC 2.88 (L) 10/14/2017 0347   HGB 13.2 10/02/2022 1558   HCT 37.9 10/02/2022 1558   PLT 164 10/02/2022 1558   MCV 91 10/02/2022 1558   MCH 31.8 10/02/2022 1558   MCH 28.8 10/14/2017 0347   MCHC 34.8 10/02/2022 1558   MCHC 31.7 10/14/2017 0347   RDW 13.9 10/02/2022 1558   LYMPHSABS 2.3 08/04/2017 1406   MONOABS 0.5 07/18/2017 1850   EOSABS 0.1 08/04/2017 1406   BASOSABS 0.0 08/04/2017 1406    BMET    Component Value Date/Time   NA 141 10/02/2022 1558   K 3.8 10/02/2022 1558   CL 103 10/02/2022 1558   CO2 19 (L) 10/02/2022 1558   GLUCOSE 112 (H) 10/02/2022 1558   GLUCOSE 115 (H) 10/14/2017 0347   BUN 7 10/02/2022 1558   CREATININE 1.04 (H) 10/02/2022 1558   CALCIUM 8.5 (L) 10/02/2022 1558   GFRNONAA 45 (L) 07/30/2020 1350   GFRAA 52 (L) 07/30/2020 1350    BNP    Component Value Date/Time   BNP 306.8 (H) 07/20/2017 1646     Assessment & Plan:   Chronic obstructive pulmonary disease (HCC) - albuterol (VENTOLIN HFA) 108 (90 Base) MCG/ACT inhaler; INHALE 2 PUFFS INTO THE LUNGS EVERY 6 HOURS AS NEEDED FOR WHEEZING OR SHORTNESS OF BREATH  Dispense: 8.5 g; Refill: 3  2. Essential hypertension  - amLODipine (NORVASC) 10 MG tablet; Take 1 tablet (10 mg total) by mouth daily.  Dispense: 100 tablet; Refill: 2 - carvedilol (COREG) 25 MG tablet; Take 1 tablet (25 mg total)  by mouth 2 (two) times daily with a meal.  Dispense: 200 tablet; Refill: 2 - losartan (COZAAR) 25 MG tablet; TAKE TAKE 1 TABLET BY MOUTH  DAILY  Dispense: 80 tablet; Refill: 3 - CBC - Comprehensive metabolic panel - Lipid Panel  3. Coronary artery disease of native artery of native heart with stable angina pectoris (HCC)  - carvedilol (COREG) 25 MG tablet; Take 1 tablet (25 mg total) by mouth 2 (two) times daily with a meal.  Dispense: 200 tablet; Refill: 2  4. Gastroesophageal reflux disease without esophagitis  - famotidine (PEPCID) 20 MG tablet; Take 1 tablet (20 mg total) by mouth 2 (two) times daily.  Dispense: 200 tablet; Refill: 1  5. Irritable bowel syndrome with constipation  - linaclotide (LINZESS) 72 MCG capsule; Take 1 capsule (72 mcg total) by mouth daily before breakfast.  Dispense: 90 capsule; Refill: 3  6. INSOMNIA, CHRONIC  - traZODone (DESYREL) 150 MG tablet; Take 1 tablet (150 mg total) by mouth at bedtime as needed. for sleep  Dispense: 90 tablet; Refill: 3  7. Colon cancer screening  - Ambulatory referral to Gastroenterology  8. Lipid screening  - Lipid Panel  9. Anxiety and depression   10. Impaired fasting glucose  - Hemoglobin A1c  11. Loss of appetite for more than 2 weeks  - Ambulatory referral to Gastroenterology  Follow up:  Follow up in 3 months     Ivonne Andrew, NP 02/18/2023

## 2023-02-18 NOTE — Patient Instructions (Signed)
1. Chronic obstructive pulmonary disease, unspecified COPD type (HCC)  - albuterol (VENTOLIN HFA) 108 (90 Base) MCG/ACT inhaler; INHALE 2 PUFFS INTO THE LUNGS EVERY 6 HOURS AS NEEDED FOR WHEEZING OR SHORTNESS OF BREATH  Dispense: 8.5 g; Refill: 3  2. Essential hypertension  - amLODipine (NORVASC) 10 MG tablet; Take 1 tablet (10 mg total) by mouth daily.  Dispense: 100 tablet; Refill: 2 - carvedilol (COREG) 25 MG tablet; Take 1 tablet (25 mg total) by mouth 2 (two) times daily with a meal.  Dispense: 200 tablet; Refill: 2 - losartan (COZAAR) 25 MG tablet; TAKE TAKE 1 TABLET BY MOUTH  DAILY  Dispense: 80 tablet; Refill: 3 - CBC - Comprehensive metabolic panel - Lipid Panel  3. Coronary artery disease of native artery of native heart with stable angina pectoris (HCC)  - carvedilol (COREG) 25 MG tablet; Take 1 tablet (25 mg total) by mouth 2 (two) times daily with a meal.  Dispense: 200 tablet; Refill: 2  4. Gastroesophageal reflux disease without esophagitis  - famotidine (PEPCID) 20 MG tablet; Take 1 tablet (20 mg total) by mouth 2 (two) times daily.  Dispense: 200 tablet; Refill: 1  5. Irritable bowel syndrome with constipation  - linaclotide (LINZESS) 72 MCG capsule; Take 1 capsule (72 mcg total) by mouth daily before breakfast.  Dispense: 90 capsule; Refill: 3  6. INSOMNIA, CHRONIC  - traZODone (DESYREL) 150 MG tablet; Take 1 tablet (150 mg total) by mouth at bedtime as needed. for sleep  Dispense: 90 tablet; Refill: 3  7. Colon cancer screening  - Ambulatory referral to Gastroenterology  8. Lipid screening  - Lipid Panel  9. Anxiety and depression   10. Impaired fasting glucose  - Hemoglobin A1c  11. Loss of appetite for more than 2 weeks  - Ambulatory referral to Gastroenterology  Follow up:  Follow up in 3 months

## 2023-02-18 NOTE — Assessment & Plan Note (Signed)
-   albuterol (VENTOLIN HFA) 108 (90 Base) MCG/ACT inhaler; INHALE 2 PUFFS INTO THE LUNGS EVERY 6 HOURS AS NEEDED FOR WHEEZING OR SHORTNESS OF BREATH  Dispense: 8.5 g; Refill: 3  2. Essential hypertension  - amLODipine (NORVASC) 10 MG tablet; Take 1 tablet (10 mg total) by mouth daily.  Dispense: 100 tablet; Refill: 2 - carvedilol (COREG) 25 MG tablet; Take 1 tablet (25 mg total) by mouth 2 (two) times daily with a meal.  Dispense: 200 tablet; Refill: 2 - losartan (COZAAR) 25 MG tablet; TAKE TAKE 1 TABLET BY MOUTH  DAILY  Dispense: 80 tablet; Refill: 3 - CBC - Comprehensive metabolic panel - Lipid Panel  3. Coronary artery disease of native artery of native heart with stable angina pectoris (HCC)  - carvedilol (COREG) 25 MG tablet; Take 1 tablet (25 mg total) by mouth 2 (two) times daily with a meal.  Dispense: 200 tablet; Refill: 2  4. Gastroesophageal reflux disease without esophagitis  - famotidine (PEPCID) 20 MG tablet; Take 1 tablet (20 mg total) by mouth 2 (two) times daily.  Dispense: 200 tablet; Refill: 1  5. Irritable bowel syndrome with constipation  - linaclotide (LINZESS) 72 MCG capsule; Take 1 capsule (72 mcg total) by mouth daily before breakfast.  Dispense: 90 capsule; Refill: 3  6. INSOMNIA, CHRONIC  - traZODone (DESYREL) 150 MG tablet; Take 1 tablet (150 mg total) by mouth at bedtime as needed. for sleep  Dispense: 90 tablet; Refill: 3  7. Colon cancer screening  - Ambulatory referral to Gastroenterology  8. Lipid screening  - Lipid Panel  9. Anxiety and depression   10. Impaired fasting glucose  - Hemoglobin A1c  11. Loss of appetite for more than 2 weeks  - Ambulatory referral to Gastroenterology  Follow up:  Follow up in 3 months

## 2023-02-19 ENCOUNTER — Other Ambulatory Visit: Payer: Self-pay | Admitting: Nurse Practitioner

## 2023-02-19 DIAGNOSIS — R945 Abnormal results of liver function studies: Secondary | ICD-10-CM

## 2023-02-19 DIAGNOSIS — N289 Disorder of kidney and ureter, unspecified: Secondary | ICD-10-CM

## 2023-03-11 ENCOUNTER — Ambulatory Visit (INDEPENDENT_AMBULATORY_CARE_PROVIDER_SITE_OTHER): Payer: 59 | Admitting: Neurology

## 2023-03-11 ENCOUNTER — Encounter: Payer: Self-pay | Admitting: Neurology

## 2023-03-11 VITALS — BP 110/68 | HR 60 | Ht 67.0 in | Wt 185.0 lb

## 2023-03-11 DIAGNOSIS — G4700 Insomnia, unspecified: Secondary | ICD-10-CM | POA: Diagnosis not present

## 2023-03-11 DIAGNOSIS — Z8673 Personal history of transient ischemic attack (TIA), and cerebral infarction without residual deficits: Secondary | ICD-10-CM

## 2023-03-11 DIAGNOSIS — Z72821 Inadequate sleep hygiene: Secondary | ICD-10-CM

## 2023-03-11 DIAGNOSIS — I69954 Hemiplegia and hemiparesis following unspecified cerebrovascular disease affecting left non-dominant side: Secondary | ICD-10-CM

## 2023-03-11 NOTE — Patient Instructions (Signed)
I had a long discussion with the patient regarding her longstanding problem of insomnia and discussed importance of good sleep hygiene as well as her remote stroke and residual left hemiparesis.  She can continue to use trazodone 150 mg at bedtime.  I recommend she start taking aspirin 81 mg daily for stroke prevention and maintain aggressive risk factor modification with strict control of hypertension with blood pressure goal below 140/90, lipids with LDL cholesterol goal below 70 mg percent and diabetes with hemoglobin A1c goal below 6.5%.  She was encouraged to use her cane at all time and we discussed fall safety precautions.  Check screening carotid ultrasound and transcranial Doppler studies.  Return for follow-up in the future with my nurse practitioner in 3 months or call earlier if necessary.  Quality Sleep Information, Adult Quality sleep is important for your mental and physical health. It also improves your quality of life. Quality sleep means you: Are asleep for most of the time you are in bed. Fall asleep within 30 minutes. Wake up no more than once a night. Are awake for no longer than 20 minutes if you do wake up during the night. Most adults need 7-8 hours of quality sleep each night. How can poor sleep affect me? If you do not get enough quality sleep, you may have: Mood swings. Daytime sleepiness. Decreased alertness, reaction time, and concentration. Sleep disorders, such as insomnia and sleep apnea. Difficulty with: Solving problems. Coping with stress. Paying attention. These issues may affect your performance and productivity at work, school, and home. Lack of sleep may also put you at higher risk for accidents, suicide, and risky behaviors. If you do not get quality sleep, you may also be at higher risk for several health problems, including: Infections. Type 2 diabetes. Heart disease. High blood pressure. Obesity. Worsening of long-term conditions, like arthritis,  kidney disease, depression, Parkinson's disease, and epilepsy. What actions can I take to get more quality sleep? Sleep schedule and routine Stick to a sleep schedule. Go to sleep and wake up at about the same time each day. Do not try to sleep less on weekdays and make up for lost sleep on weekends. This does not work. Limit naps during the day to 30 minutes or less. Do not take naps in the late afternoon. Make time to relax before bed. Reading, listening to music, or taking a hot bath promotes quality sleep. Make your bedroom a place that promotes quality sleep. Keep your bedroom dark, quiet, and at a comfortable room temperature. Make sure your bed is comfortable. Avoid using electronic devices that give off bright blue light for 30 minutes before bedtime. Your brain perceives bright blue light as sunlight. This includes television, phones, and computers. If you are lying awake in bed for longer than 20 minutes, get up and do a relaxing activity until you feel sleepy. Lifestyle     Try to get at least 30 minutes of exercise on most days. Do not exercise 2-3 hours before going to bed. Do not use any products that contain nicotine or tobacco. These products include cigarettes, chewing tobacco, and vaping devices, such as e-cigarettes. If you need help quitting, ask your health care provider. Do not drink caffeinated beverages for at least 8 hours before going to bed. Coffee, tea, and some sodas contain caffeine. Do not drink alcohol or eat large meals close to bedtime. Try to get at least 30 minutes of sunlight every day. Morning sunlight is best. Medical concerns Work  with your health care provider to treat medical conditions that may affect sleeping, such as: Nasal obstruction. Snoring. Sleep apnea and other sleep disorders. Talk to your health care provider if you think any of your prescription medicines may cause you to have difficulty falling or staying asleep. If you have sleep  problems, talk with a sleep consultant. If you think you have a sleep disorder, talk with your health care provider about getting evaluated by a specialist. Where to find more information Sleep Foundation: sleepfoundation.org American Academy of Sleep Medicine: aasm.org Centers for Disease Control and Prevention (CDC): TonerPromos.no Contact a health care provider if: You have trouble getting to sleep or staying asleep. You often wake up very early in the morning and cannot get back to sleep. You have daytime sleepiness. You have daytime sleep attacks of suddenly falling asleep and sudden muscle weakness (narcolepsy). You have a tingling sensation in your legs with a strong urge to move your legs (restless legs syndrome). You stop breathing briefly during sleep (sleep apnea). You think you have a sleep disorder or are taking a medicine that is affecting your quality of sleep. Summary Most adults need 7-8 hours of quality sleep each night. Getting enough quality sleep is important for your mental and physical health. Make your bedroom a place that promotes quality sleep, and avoid things that may cause you to have poor sleep, such as alcohol, caffeine, smoking, or large meals. Talk to your health care provider if you have trouble falling asleep or staying asleep. This information is not intended to replace advice given to you by your health care provider. Make sure you discuss any questions you have with your health care provider. Document Revised: 10/29/2021 Document Reviewed: 10/29/2021 Elsevier Patient Education  2024 [employer]  Insomnia Insomnia is a sleep disorder that makes it difficult to fall asleep or stay asleep. Insomnia can cause fatigue, low energy, difficulty concentrating, mood swings, and poor performance at work or school. There are three different ways to classify insomnia: Difficulty falling asleep. Difficulty staying asleep. Waking up too early in the morning. Any type of  insomnia can be long-term (chronic) or short-term (acute). Both are common. Short-term insomnia usually lasts for 3 months or less. Chronic insomnia occurs at least three times a week for longer than 3 months. What are the causes? Insomnia may be caused by another condition, situation, or substance, such as: Having certain mental health conditions, such as anxiety and depression. Using caffeine, alcohol, tobacco, or drugs. Having gastrointestinal conditions, such as gastroesophageal reflux disease (GERD). Having certain medical conditions. These include: Asthma. Alzheimer's disease. Stroke. Chronic pain. An overactive thyroid gland (hyperthyroidism). Other sleep disorders, such as restless legs syndrome and sleep apnea. Menopause. Sometimes, the cause of insomnia may not be known. What increases the risk? Risk factors for insomnia include: Gender. Females are affected more often than males. Age. Insomnia is more common as people get older. Stress and certain medical and mental health conditions. Lack of exercise. Having an irregular work schedule. This may include working night shifts and traveling between different time zones. What are the signs or symptoms? If you have insomnia, the main symptom is having trouble falling asleep or having trouble staying asleep. This may lead to other symptoms, such as: Feeling tired or having low energy. Feeling nervous about going to sleep. Not feeling rested in the morning. Having trouble concentrating. Feeling irritable, anxious, or depressed. How is this diagnosed? This condition may be diagnosed based on: Your symptoms and  medical history. Your health care provider may ask about: Your sleep habits. Any medical conditions you have. Your mental health. A physical exam. How is this treated? Treatment for insomnia depends on the cause. Treatment may focus on treating an underlying condition that is causing the insomnia. Treatment may also  include: Medicines to help you sleep. Counseling or therapy. Lifestyle adjustments to help you sleep better. Follow these instructions at home: Eating and drinking  Limit or avoid alcohol, caffeinated beverages, and products that contain nicotine and tobacco, especially close to bedtime. These can disrupt your sleep. Do not eat a large meal or eat spicy foods right before bedtime. This can lead to digestive discomfort that can make it hard for you to sleep. Sleep habits  Keep a sleep diary to help you and your health care provider figure out what could be causing your insomnia. Write down: When you sleep. When you wake up during the night. How well you sleep and how rested you feel the next day. Any side effects of medicines you are taking. What you eat and drink. Make your bedroom a dark, comfortable place where it is easy to fall asleep. Put up shades or blackout curtains to block light from outside. Use a white noise machine to block noise. Keep the temperature cool. Limit screen use before bedtime. This includes: Not watching TV. Not using your smartphone, tablet, or computer. Stick to a routine that includes going to bed and waking up at the same times every day and night. This can help you fall asleep faster. Consider making a quiet activity, such as reading, part of your nighttime routine. Try to avoid taking naps during the day so that you sleep better at night. Get out of bed if you are still awake after 15 minutes of trying to sleep. Keep the lights down, but try reading or doing a quiet activity. When you feel sleepy, go back to bed. General instructions Take over-the-counter and prescription medicines only as told by your health care provider. Exercise regularly as told by your health care provider. However, avoid exercising in the hours right before bedtime. Use relaxation techniques to manage stress. Ask your health care provider to suggest some techniques that may work  well for you. These may include: Breathing exercises. Routines to release muscle tension. Visualizing peaceful scenes. Make sure that you drive carefully. Do not drive if you feel very sleepy. Keep all follow-up visits. This is important. Contact a health care provider if: You are tired throughout the day. You have trouble in your daily routine due to sleepiness. You continue to have sleep problems, or your sleep problems get worse. Get help right away if: You have thoughts about hurting yourself or someone else. Get help right away if you feel like you may hurt yourself or others, or have thoughts about taking your own life. Go to your nearest emergency room or: Call 911. Call the National Suicide Prevention Lifeline at (709) 460-7598 or 988. This is open 24 hours a day. Text the Crisis Text Line at (952)250-0083. Summary Insomnia is a sleep disorder that makes it difficult to fall asleep or stay asleep. Insomnia can be long-term (chronic) or short-term (acute). Treatment for insomnia depends on the cause. Treatment may focus on treating an underlying condition that is causing the insomnia. Keep a sleep diary to help you and your health care provider figure out what could be causing your insomnia. This information is not intended to replace advice given to you by your  health care provider. Make sure you discuss any questions you have with your health care provider. Document Revised: 06/16/2021 Document Reviewed: 06/16/2021 Elsevier Patient Education  2024 [employer].

## 2023-03-11 NOTE — Progress Notes (Signed)
[facility] [address] [place]. [place] [address] [phone]       OFFICE CONSULT NOTE  Karen Ochoa Date of Birth:  [birth year] Medical Record Number:  [id number]   Referring MD: Irene Shipper, NP  Reason for Referral: Insomnia  HPI: Karen Ochoa is a 57 year old pleasant African-American lady seen today for office consultation visit for insomnia and sleep difficulties.  History is obtained from the patient and review of electronic medical records and I personally reviewed pertinent available imaging films in PACS.  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  Patient states she started having sleep difficulties following her stroke.  She can barely sleep 4 to 5 hours.  She has tried different over-the-counter medications which have not worked.  Melatonin and Benadryl did not work.  Most recently she has been on trazodone she initially started at 50 mg with dose has been incrementally increased to now 150 mg which gives her about 4 to 5 hours of sleep.  She reports poor sleep hygiene and watches television right till she is about to fall asleep.  She does not note any daytime sleepiness and does not 5 during the day.  She denies any symptoms suggestive of restless legs or sleep apnea.  She continues to have mild residual left hemiparesis from her pontine stroke from 2018 but is able to ambulate with a cane she remains on aspirin for stroke prevention and on Crestor.  Last lipid profile on 02/18/2023 showed LDL-cholesterol to be optimal at 46 mg percent and hemoglobin A1c 5.6.  She has not had any recurrent stroke or TIA symptoms for last 5 years. Prior visit 2019  Karen Ochoa: Karen Ochoa is a pleasant 44 year african american lady who is referred for initial consultation visit today.   History is obtained from the patient as well as review of electronic medical records.  I have personally reviewed imaging films.  She was admitted to [facility] on 07/19/2017 with sudden onset of left-sided weakness for a couple of days prior to presentation.  She was outside the time window for intervention.  She did give history of several mini strokes in the past going back to 2007.  An MRI scan of the brain personally reviewed by me done on 07/18/2017 showed a tiny punctate right posterior paramedian pontine diffusion positive lesion felt to be of acute lacunar infarct.  There were numerous foci of T2/flair hyperintensities present throughout subcortical, periventricular white matter as well as involving the brainstem, corpus callosum bilateral thalami and cerebellar white matter as well.  No enhancing lesions were noted.  A question of demyelinating disease was raised but given patient's prior history and uncontrolled risk factors extensive chronic small vessel disease was felt to be more likely.  MRA of the brain showed no large vessel occlusion but showed severe proximal left P2 stenosis.  Transthoracic echo showed concentric left ventricular hypertrophy with diffuse hypokinesis and diminished left ventricular ejection fraction of 30 to 35%.  Cardiac catheterization on 10/05/2017 showed severe multivessel coronary artery disease for which he underwent five-vessel coronary artery bypass surgery.  She has done well from surgical standpoint and does not have any exertional angina or chest pain.  She still has some mild residual left leg weakness.  She has to use a cane to walk.  She is careful and she  has not had any falls or injuries.  She remains on aspirin which is tolerating well without bruising or bleeding.  She states her blood pressure runs quite good and today in fact it is low at 96/7 3.  She is also tolerating Lipitor well without muscle aches and pains.  She states she had a history of mini  strokes in 2007 and review of electronic medical records reveals an MRI at that time which an ultrasound extensive changes of chronic small vessel disease.  She denies history suggestive of multiple sclerosis in the form of episode of vision loss, diplopia, vertigo, excessive fatigue or tiredness or bladder control problems.  She used to smoke 1 pack/day for greater than 10 years but quit after her cardiac surgery.   Interval history 07/28/2018: Patient is being seen today for 62-month follow-up visit and is accompanied by her husband.  She does report to have residual left-sided weakness/numbness, balance difficulties and cognitive deficits. Her husband assists with ADL's and IADL's. She continues to do some exercises at home but does endorse increased weakness if stands for too long. She continues to ambulate with cane at all times due to balance difficulties. She denies any recent falls.  She has not returned to working at this time as she was previously working through Clinical biochemist.  She is fearful of being unable to type on a computer due to her left hand weakness along with inability to perform job tasks due to her cognitive deficits.  She also states with increased stress, activity or fatigue, her symptoms can worsen.  States she has attempted to apply for disability but has been declined and currently in review by an attorney.  She continues on aspirin without side effects of bleeding or bruising.  Continues on atorvastatin without side effects myalgias.  Blood pressure today satisfactory at 116/81.  No further concerns at this time.  Denies new or worsening stroke/TIA symptoms.   ROS:   14 system review of systems is positive for sleeping difficulty, insomnia, weakness, imbalance, gait difficulty all other systems negative  PMH:  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   Fibroids    GERD (gastroesophageal reflux disease)    Headache     "weekly" (07/20/2017)   HLD (hyperlipidemia) 07/18/2017   Hx MRSA infection    Hypertension    Insomnia due to medical condition    Ischemic cardiomyopathy    Stroke (HCC) 07/18/2017   TIA (transient ischemic attack)    "I've had several"    Social History:  Social History   Socioeconomic History   Marital status: Single    Spouse name: Not on file   Number of children: Not on file   Years of education: Not on file   Highest education level: Not on file  Occupational History   Not on file  Tobacco Use   Smoking status: Every Day    Current packs/day: 1.00    Average packs/day: 1 pack/day for 10.0 years (10.0 ttl pk-yrs)    Types: Cigarettes   Smokeless tobacco: Never   Tobacco comments:    2 to 3 per day  Vaping Use   Vaping status: Never Used  Substance and Sexual Activity   Alcohol use: No    Alcohol/week: 2.0 standard drinks of alcohol    Types: 2 Glasses of wine per week    Comment: 08/04/2017--states no  Drug use: No   Sexual activity: Yes    Birth control/protection: None  Other Topics Concern   Not on file  Social History Narrative   ** Merged History Encounter **       Social Determinants of Health   Financial Resource Strain: Low Risk  (07/15/2022)   Overall Financial Resource Strain (CARDIA)    Difficulty of Paying Living Expenses: Not hard at all  Food Insecurity: No Food Insecurity (07/15/2022)   Hunger Vital Sign    Worried About Running Out of Food in the Last Year: Never true    Ran Out of Food in the Last Year: Never true  Transportation Needs: No Transportation Needs (07/15/2022)   PRAPARE - Administrator, Civil Service (Medical): No    Lack of Transportation (Non-Medical): No  Physical Activity: Inactive (07/15/2022)   Exercise Vital Sign    Days of Exercise per Week: 0 days    Minutes of Exercise per Session: 0 min  Stress: No Stress Concern Present (07/15/2022)   [employer] of Occupational Health - Occupational  Stress Questionnaire    Feeling of Stress : Not at all  Social Connections: Socially Integrated (07/15/2022)   Social Connection and Isolation Panel [NHANES]    Frequency of Communication with Friends and Family: More than three times a week    Frequency of Social Gatherings with Friends and Family: Once a week    Attends Religious Services: More than 4 times per year    Active Member of [employer] or Organizations: Yes    Attends Engineer, structural: More than 4 times per year    Marital Status: Married  Catering manager Violence: Not At Risk (07/15/2022)   Humiliation, Afraid, Rape, and Kick questionnaire    Fear of Current or Ex-Partner: No    Emotionally Abused: No    Physically Abused: No    Sexually Abused: No    Medications:   Current Outpatient Medications on File Prior to Visit  Medication Sig Dispense Refill   albuterol (VENTOLIN HFA) 108 (90 Base) MCG/ACT inhaler INHALE 2 PUFFS INTO THE LUNGS EVERY 6 HOURS AS NEEDED FOR WHEEZING OR SHORTNESS OF BREATH 8.5 g 3   amLODipine (NORVASC) 10 MG tablet Take 1 tablet (10 mg total) by mouth daily. 100 tablet 2   carvedilol (COREG) 25 MG tablet Take 1 tablet (25 mg total) by mouth 2 (two) times daily with a meal. 200 tablet 2   famotidine (PEPCID) 20 MG tablet Take 1 tablet (20 mg total) by mouth 2 (two) times daily. 200 tablet 1   ibuprofen (ADVIL) 800 MG tablet Take 1 tablet (800 mg total) by mouth every 6 (six) hours as needed for headache. 30 tablet 2   levocetirizine (XYZAL) 5 MG tablet Take 1 tablet (5 mg total) by mouth every evening. 90 tablet 2   linaclotide (LINZESS) 72 MCG capsule Take 1 capsule (72 mcg total) by mouth daily before breakfast. 90 capsule 3   losartan (COZAAR) 25 MG tablet TAKE TAKE 1 TABLET BY MOUTH  DAILY 80 tablet 3   megestrol (MEGACE) 20 MG tablet Take 1 tablet (20 mg total) by mouth daily. 30 tablet 0   rosuvastatin (CRESTOR) 10 MG tablet Take 1 tablet (10 mg total) by mouth daily. 100 tablet 1    traZODone (DESYREL) 150 MG tablet Take 1 tablet (150 mg total) by mouth at bedtime as needed. for sleep 90 tablet 3   No current facility-administered medications on file prior  to visit.    Allergies:   Allergies  Allergen Reactions   Penicillins Anaphylaxis and Other (See Comments)    Has patient had a PCN reaction causing immediate rash, facial/tongue/throat swelling, SOB or lightheadedness with hypotension: Yes Has patient had a PCN reaction causing severe rash involving mucus membranes or skin necrosis: Yes Has patient had a PCN reaction that required hospitalization Yes Has patient had a PCN reaction occurring within the last 10 years: No If all of the above answers are "NO", then may proceed with Cephalosporin use.    Sulfonamide Derivatives Anaphylaxis   Clonidine Derivatives Other (See Comments)    Patient passed out shortly after ingesting this at a doctor's office on 05/08/16 (was taken in conjunction with a tablet of Labetalol.)   Flagyl [Metronidazole] Swelling and Other (See Comments)    Skin blisters   Labetalol Other (See Comments)    Patient passed out shortly after ingesting this at a doctor's office on 05/08/16 (was taken in conjunction with a tablet of Clonidine.)    Physical Exam General: well developed, well nourished, seated, in no evident distress Head: head normocephalic and atraumatic.   Neck: supple with no carotid or supraclavicular bruits Cardiovascular: regular rate and rhythm, no murmurs Musculoskeletal: no deformity Skin:  no rash/petichiae Vascular:  Normal pulses all extremities  Neurologic Exam Mental Status: Awake and fully alert. Oriented to place and time. Recent and remote memory intact. Attention span, concentration and fund of knowledge appropriate. Mood and affect appropriate.  Cranial Nerves: Fundoscopic exam reveals sharp disc margins. Pupils equal, briskly reactive to light. Extraocular movements full without nystagmus. Visual fields full  to confrontation. Hearing intact. Facial sensation intact. Face, tongue, palate moves normally and symmetrically.  Motor: Spastic left hemiparesis with 4/5 strength with weakness of left grip and intrinsic hand muscles as well as left hip flexors and ankle dorsiflexors.  Left foot drop.  Left grip is weak.  Tone is increased on the left.   Sensory.: i mildly diminished on left hemibody to touch , pinprick , position and vibratory sensation.  Coordination: Rapid alternating movements normal in all extremities. Finger-to-nose and heel-to-shin performed accurately bilaterally. Gait and Station: Arises from chair without difficulty. Stance is normal.  Spastic hemiplegic gait with left foot drop uses a cane.  Drags left foot. Reflexes: 2+ and asymmetric and brisker on the left. Toes downgoing.   NIHSS  3 Modified Rankin  2   ASSESSMENT: 57 year old African-American lady with longstanding insomnia and sleep difficulties with remote history of right pontine infarct in 2019 with mild residual spastic left hemiparesis.  Vascular risk factors of diabetes, hypertension, hyperlipidemia and prior stroke.     PLAN:I had a long discussion with the patient regarding her longstanding problem of insomnia and discussed importance of good sleep hygiene as well as her remote stroke and residual left hemiparesis.  She can continue to use trazodone 150 mg at bedtime.  I recommend she start taking aspirin 81 mg daily for stroke prevention and maintain aggressive risk factor modification with strict control of hypertension with blood pressure goal below 140/90, lipids with LDL cholesterol goal below 70 mg percent and diabetes with hemoglobin A1c goal below 6.5%.  She was encouraged to use her cane at all time and we discussed fall safety precautions.  Check screening carotid ultrasound and transcranial Doppler studies.  Return for follow-up in the future with my nurse practitioner in 3 months or call earlier if necessary.   Greater than 50% time during this  45-minute consultation was spent on counseling and coordination of care about her chronic sleeping difficulties and remote pontine stroke and hemiparesis and answering questions  Delia Heady, MD Note: This document was prepared with digital dictation and possible smart phrase technology. Any transcriptional errors that result from this process are unintentional.

## 2023-03-16 ENCOUNTER — Ambulatory Visit (HOSPITAL_COMMUNITY): Admission: RE | Admit: 2023-03-16 | Payer: 59 | Source: Ambulatory Visit

## 2023-03-16 ENCOUNTER — Ambulatory Visit (HOSPITAL_COMMUNITY)
Admission: RE | Admit: 2023-03-16 | Discharge: 2023-03-16 | Disposition: A | Discharge status: Home / Self Care | Payer: 59 | Source: Ambulatory Visit | Attending: Neurology | Admitting: Neurology

## 2023-03-16 DIAGNOSIS — Z8673 Personal history of transient ischemic attack (TIA), and cerebral infarction without residual deficits: Secondary | ICD-10-CM

## 2023-03-22 NOTE — Progress Notes (Signed)
Kindly inform the patient that transcranial Doppler study shows normal mean flow velocities in all blood vessels identified in the brain.  Nothing to worry about

## 2023-03-22 NOTE — Progress Notes (Signed)
 Kindly inform the patient that carotid ultrasound study shows no significant narrowing of either carotid arteries in the neck on both sides.

## 2023-03-23 ENCOUNTER — Telehealth: Payer: Self-pay | Admitting: Neurology

## 2023-03-23 NOTE — Telephone Encounter (Signed)
Called the husband as he is listed on DPR. Reviewed the results of the carotid US and TCD and advised of the normal findings.  "Kindly inform the patient that carotid ultrasound study shows no significant narrowing of either carotid arteries in the neck on both sides."  Husband verbalized understanding and had no further questions.

## 2023-03-23 NOTE — Telephone Encounter (Signed)
-----   Message from Delia Heady sent at 03/22/2023  8:50 PM EDT ----- Karen Ochoa inform the patient that transcranial Doppler study shows normal mean flow velocities in all blood vessels identified in the brain.  Nothing to worry about

## 2023-04-08 ENCOUNTER — Telehealth: Payer: Self-pay

## 2023-04-08 NOTE — Telephone Encounter (Signed)
Pt was made an appointment due to vomiting for four weeks . Kh

## 2023-04-09 ENCOUNTER — Encounter: Payer: Self-pay | Admitting: Nurse Practitioner

## 2023-04-09 ENCOUNTER — Ambulatory Visit (INDEPENDENT_AMBULATORY_CARE_PROVIDER_SITE_OTHER): Payer: 59 | Admitting: Nurse Practitioner

## 2023-04-09 ENCOUNTER — Emergency Department (HOSPITAL_COMMUNITY)
Admission: EM | Admit: 2023-04-09 | Discharge: 2023-04-10 | Disposition: A | Discharge status: Home / Self Care | Payer: 59 | Attending: Emergency Medicine | Admitting: Emergency Medicine

## 2023-04-09 ENCOUNTER — Emergency Department (HOSPITAL_COMMUNITY): Payer: 59

## 2023-04-09 ENCOUNTER — Other Ambulatory Visit: Payer: Self-pay

## 2023-04-09 ENCOUNTER — Encounter (HOSPITAL_COMMUNITY): Payer: Self-pay

## 2023-04-09 VITALS — BP 95/62 | HR 70 | Resp 16 | Ht 67.0 in | Wt 184.4 lb

## 2023-04-09 DIAGNOSIS — K449 Diaphragmatic hernia without obstruction or gangrene: Secondary | ICD-10-CM | POA: Diagnosis not present

## 2023-04-09 DIAGNOSIS — R630 Anorexia: Secondary | ICD-10-CM | POA: Diagnosis not present

## 2023-04-09 DIAGNOSIS — Z7982 Long term (current) use of aspirin: Secondary | ICD-10-CM | POA: Insufficient documentation

## 2023-04-09 DIAGNOSIS — R109 Unspecified abdominal pain: Secondary | ICD-10-CM | POA: Diagnosis not present

## 2023-04-09 DIAGNOSIS — Z79899 Other long term (current) drug therapy: Secondary | ICD-10-CM | POA: Diagnosis not present

## 2023-04-09 DIAGNOSIS — R112 Nausea with vomiting, unspecified: Secondary | ICD-10-CM | POA: Insufficient documentation

## 2023-04-09 DIAGNOSIS — R110 Nausea: Secondary | ICD-10-CM | POA: Diagnosis not present

## 2023-04-09 DIAGNOSIS — I10 Essential (primary) hypertension: Secondary | ICD-10-CM | POA: Diagnosis not present

## 2023-04-09 LAB — COMPREHENSIVE METABOLIC PANEL
ALT: 29 U/L (ref 0–44)
AST: 53 U/L — ABNORMAL HIGH (ref 15–41)
Albumin: 3.8 g/dL (ref 3.5–5.0)
Alkaline Phosphatase: 94 U/L (ref 38–126)
Anion gap: 10 (ref 5–15)
BUN: 8 mg/dL (ref 6–20)
CO2: 24 mmol/L (ref 22–32)
Calcium: 8.6 mg/dL — ABNORMAL LOW (ref 8.9–10.3)
Chloride: 102 mmol/L (ref 98–111)
Creatinine, Ser: 1.15 mg/dL — ABNORMAL HIGH (ref 0.44–1.00)
GFR, Estimated: 55 mL/min — ABNORMAL LOW (ref >60)
Glucose, Bld: 114 mg/dL — ABNORMAL HIGH (ref 70–99)
Potassium: 3.1 mmol/L — ABNORMAL LOW (ref 3.5–5.1)
Sodium: 136 mmol/L (ref 135–145)
Total Bilirubin: 0.7 mg/dL (ref 0.3–1.2)
Total Protein: 7.1 g/dL (ref 6.5–8.1)

## 2023-04-09 LAB — CBC
HCT: 37 % (ref 36.0–46.0)
Hemoglobin: 12.1 g/dL (ref 12.0–15.0)
MCH: 29.7 pg (ref 26.0–34.0)
MCHC: 32.7 g/dL (ref 30.0–36.0)
MCV: 90.9 fL (ref 80.0–100.0)
Platelets: 188 10*3/uL (ref 150–400)
RBC: 4.07 MIL/uL (ref 3.87–5.11)
RDW: 15.2 % (ref 11.5–15.5)
WBC: 6.8 10*3/uL (ref 4.0–10.5)
nRBC: 0 % (ref 0.0–0.2)

## 2023-04-09 LAB — LIPASE, BLOOD: Lipase: 27 U/L (ref 11–51)

## 2023-04-09 MED ORDER — SODIUM CHLORIDE 0.9 % IV BOLUS
1000.0000 mL | Freq: Once | INTRAVENOUS | Status: AC
  Administered 2023-04-09: 1000 mL via INTRAVENOUS

## 2023-04-09 MED ORDER — ONDANSETRON 4 MG PO TBDP: 4.0000 mg | ORAL_TABLET | Freq: Three times a day (TID) | ORAL | 0 refills | Status: DC | PRN

## 2023-04-09 NOTE — Patient Instructions (Signed)
1. Nausea  - ondansetron (ZOFRAN-ODT) 4 MG disintegrating tablet; Take 1 tablet (4 mg total) by mouth every 8 (eight) hours as needed.  Dispense: 20 tablet; Refill: 0  -Patient was advised that she needs to go to the emergency room for further evaluation and to get fluids for rehydration.  Patient is agreeable.  Nurse escorted patient via wheelchair to the emergency room  Follow up:  Follow up after hospital discharge

## 2023-04-09 NOTE — ED Provider Notes (Signed)
[place] EMERGENCY DEPARTMENT AT [facility] Provider Note   CSN: [id number] Arrival date & time: 04/09/23  1341     History {Add pertinent medical, surgical, social history, OB history to HPI:1} Chief Complaint  Patient presents with   Emesis    Karen Ochoa is a 57 y.o. female.  HPI   Patient with medical history including hypertension, CVA with residual left-sided weakness, presenting with complaints of decreased appetite and occasional nausea vomiting.  Patient states that over the last 3 to 4 weeks she has been having decreased appetite, states that she just does not feel hungry, she states that occasionally when she eats she will have some nausea and vomiting, denies any bloody emesis or coffee-ground emesis, states that she has been able to drink, has been drinking ensures, she states that she has not had a good bowel movement in 1 week's time, she denies any urinary symptoms, no associated stomach pains, she denies any fevers chills cough congestion general body aches.  She is currently having no complaints at this time, she was informed by her primary doctor to come to the emergency department for further assessment.  Home Medications Prior to Admission medications   Medication Sig Start Date End Date Taking? Authorizing Provider  albuterol (VENTOLIN HFA) 108 (90 Base) MCG/ACT inhaler INHALE 2 PUFFS INTO THE LUNGS EVERY 6 HOURS AS NEEDED FOR WHEEZING OR SHORTNESS OF BREATH 02/18/23   Ivonne Andrew, NP  amLODipine (NORVASC) 10 MG tablet Take 1 tablet (10 mg total) by mouth daily. 02/18/23   Ivonne Andrew, NP  aspirin EC 81 MG tablet Take 81 mg by mouth daily. Swallow whole.    [provider]  carvedilol (COREG) 25 MG tablet Take 1 tablet (25 mg total) by mouth 2 (two) times daily with a meal. 02/18/23   Ivonne Andrew, NP  famotidine (PEPCID) 20 MG tablet Take 1 tablet (20 mg total) by mouth 2 (two) times daily. 02/18/23   Ivonne Andrew, NP  ibuprofen  (ADVIL) 800 MG tablet Take 1 tablet (800 mg total) by mouth every 6 (six) hours as needed for headache. 02/18/23   Ivonne Andrew, NP  levocetirizine (XYZAL) 5 MG tablet Take 1 tablet (5 mg total) by mouth every evening. 12/28/22   Ivonne Andrew, NP  linaclotide [facility]) 72 MCG capsule Take 1 capsule (72 mcg total) by mouth daily before breakfast. 02/18/23   Ivonne Andrew, NP  losartan (COZAAR) 25 MG tablet TAKE TAKE 1 TABLET BY MOUTH  DAILY 02/18/23   Ivonne Andrew, NP  ondansetron (ZOFRAN-ODT) 4 MG disintegrating tablet Take 1 tablet (4 mg total) by mouth every 8 (eight) hours as needed. 04/09/23   Ivonne Andrew, NP  rosuvastatin (CRESTOR) 10 MG tablet Take 1 tablet (10 mg total) by mouth daily. 02/18/23   Ivonne Andrew, NP  traZODone (DESYREL) 150 MG tablet Take 1 tablet (150 mg total) by mouth at bedtime as needed. for sleep 02/18/23   Ivonne Andrew, NP      Allergies    Penicillins, Sulfonamide derivatives, Clonidine derivatives, Flagyl [metronidazole], and Labetalol    Review of Systems   Review of Systems  Constitutional:  Negative for chills and fever.  Respiratory:  Negative for shortness of breath.   Cardiovascular:  Negative for chest pain.  Gastrointestinal:  Positive for constipation, nausea and vomiting. Negative for abdominal pain.  Neurological:  Negative for headaches.    Physical Exam Updated Vital Signs  BP 111/79 (BP Location: Left Arm)   Pulse 67   Temp 97.9 F (36.6 C) (Oral)   Resp 18   LMP 05/19/2013   SpO2 97%  Physical Exam Vitals and nursing note reviewed.  Constitutional:      General: She is not in acute distress.    Appearance: She is not ill-appearing.  HENT:     Head: Normocephalic and atraumatic.     Nose: No congestion.  Eyes:     Conjunctiva/sclera: Conjunctivae normal.  Cardiovascular:     Rate and Rhythm: Normal rate and regular rhythm.     Pulses: Normal pulses.     Heart sounds: No murmur heard.    No friction rub. No gallop.   Pulmonary:     Effort: No respiratory distress.     Breath sounds: No wheezing, rhonchi or rales.  Abdominal:     Palpations: Abdomen is soft.     Tenderness: There is no abdominal tenderness. There is no right CVA tenderness or left CVA tenderness.     Comments: Abdomen nondistended, soft, no guarding rebound has peritoneal sign, she has nonfocal eyes tenderness in the abdomen, very minimal on my exam.  She has a nonsurgical abdomen.  Skin:    General: Skin is warm and dry.  Neurological:     Mental Status: She is alert.     Comments: Cranial nerves II through XII grossly intact no difficulty with word finding following two-step commands there is no unilateral weakness present my examination.  Psychiatric:        Mood and Affect: Mood normal.     ED Results / Procedures / Treatments   Labs (all labs ordered are listed, but only abnormal results are displayed) Labs Reviewed  COMPREHENSIVE METABOLIC PANEL - Abnormal; Notable for the following components:      Result Value   Potassium 3.1 (*)    Glucose, Bld 114 (*)    Creatinine, Ser 1.15 (*)    Calcium 8.6 (*)    AST 53 (*)    GFR, Estimated 55 (*)    All other components within normal limits  LIPASE, BLOOD  CBC  URINALYSIS, ROUTINE W REFLEX MICROSCOPIC  MAGNESIUM    EKG None  Radiology No results found.  Procedures Procedures  {Document cardiac monitor, telemetry assessment procedure when appropriate:1}  Medications Ordered in ED Medications  sodium chloride 0.9 % bolus 1,000 mL (has no administration in time range)    ED Course/ Medical Decision Making/ A&P   {   Click here for ABCD2, HEART and other calculatorsREFRESH Note before signing :1}                              Medical Decision Making Amount and/or Complexity of Data Reviewed Labs: ordered. Radiology: ordered.   This patient presents to the ED for concern of nausea vomiting, this involves an extensive number of treatment options, and is a  complaint that carries with it a high risk of complications and morbidity.  The differential diagnosis includes bowel obstruction, volvulus, intra-abdominal mass, metabolic abnormality    Additional history obtained:  Additional history obtained from N/A External records from outside source obtained and reviewed including PCP notes   Co morbidities that complicate the patient evaluation  Hypertension,  Social Determinants of Health:   N/A    Lab Tests:  I Ordered, and personally interpreted labs.  The pertinent results include: CBC is unremarkable, CMP reveals  potassium 3.1, glucose 114, creatinine 1.15, calcium 0.6, AST 53, lipase 27,   Imaging Studies ordered:  I ordered imaging studies including CT ab pelvis without contrast I independently visualized and interpreted imaging which showed *** I agree with the radiologist interpretation   Cardiac Monitoring:  The patient was maintained on a cardiac monitor.  I personally viewed and interpreted the cardiac monitored which showed an underlying rhythm of: N/A   Medicines ordered and prescription drug management:  I ordered medication including fluids I have reviewed the patients home medicines and have made adjustments as needed  Critical Interventions:  N/A   Reevaluation:  Presents with decreased appetite occasional nausea and vomiting, triage obtain basic lab workup which I personally reviewed, shows a slight decrease in her potassium, will add on a magnesium, due to her decreased appetite and decreased bowel movements, and nonspecific abdominal pain will sit down for CT abdomen pelvis for possible malignancy.  Will also provide with some fluids she has a slight AKI appears slightly dry on my exam.    Consultations Obtained:  I requested consultation with the ***,  and discussed lab and imaging findings as well as pertinent plan - they recommend: ***    Test Considered:  ***    Rule  out ****    Dispostion and problem list  After consideration of the diagnostic results and the patients response to treatment, I feel that the patent would benefit from ***.       {Document critical care time when appropriate:1} {Document review of labs and clinical decision tools ie heart score, Chads2Vasc2 etc:1}  {Document your independent review of radiology images, and any outside records:1} {Document your discussion with family members, caretakers, and with consultants:1} {Document social determinants of health affecting pt's care:1} {Document your decision making why or why not admission, treatments were needed:1} Final Clinical Impression(s) / ED Diagnoses Final diagnoses:  None    Rx / DC Orders ED Discharge Orders     None

## 2023-04-09 NOTE — Progress Notes (Signed)
Subjective   Patient ID: Karen Ochoa, female    DOB: 29-Mar-1966, 57 y.o.   MRN: [id number]  Chief Complaint  Patient presents with   Emesis    For 4 weeks, unable to eat solids, has been drinking ensure    Referring provider: Ivonne Andrew, NP  ATLEY CARDONE is a 57 y.o. 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  She states that she has had decreased appetite for the past 4 weeks.  She states that for the past 4 days she has been vomiting and having nausea.  She states that she really has not ate anything in the past 4 days and has barely drink anything.  We discussed that we will need to take her down to the emergency room for further evaluation bleeding and some fluids.  Patient's blood pressure is low in office today.  Patient states that she has been weak and has fallen multiple times over the past couple weeks. Denies hemoptysis, PND, leg swelling Denies chest pain or edema     Allergies  Allergen Reactions   Penicillins Anaphylaxis and Other (See Comments)    Has patient had a PCN reaction causing immediate rash, facial/tongue/throat swelling, SOB or lightheadedness with hypotension: Yes Has patient had a PCN reaction causing severe rash involving mucus membranes or skin necrosis: Yes Has patient had a PCN reaction that required hospitalization Yes Has patient had a PCN reaction occurring within the last 10 years: No If all of the above answers are "NO", then may proceed  with Cephalosporin use.    Sulfonamide Derivatives Anaphylaxis   Clonidine Derivatives Other (See Comments)    Patient passed out shortly after ingesting this at a doctor's office on 05/08/16 (was taken in conjunction with a tablet of Labetalol.)   Flagyl [Metronidazole] Swelling and Other (See Comments)    Skin blisters   Labetalol Other (See Comments)    Patient passed out shortly after ingesting this at a doctor's office on 05/08/16 (was taken in conjunction with a tablet of Clonidine.)    Immunization History  Administered Date(s) Administered   PFIZER(Purple Top)SARS-COV-2 Vaccination 10/12/2019, 11/06/2019, 07/23/2020   Pneumococcal Polysaccharide-23 08/04/2017   Tdap 09/06/2017    Tobacco History: Social History   Tobacco Use  Smoking Status Every Day   Current packs/day: 1.00   Average packs/day: 1 pack/day for 10.0 years (10.0 ttl pk-yrs)   Types: Cigarettes  Smokeless Tobacco Never  Tobacco Comments   2 to 3 per day   Ready to quit: Not Answered Counseling given: Not Answered Tobacco comments: 2 to 3 per day   Outpatient Encounter Medications as of 04/09/2023  Medication Sig   albuterol (VENTOLIN HFA) 108 (90 Base) MCG/ACT inhaler INHALE 2 PUFFS INTO THE LUNGS EVERY 6 HOURS AS NEEDED FOR WHEEZING OR SHORTNESS OF BREATH   amLODipine (NORVASC) 10 MG tablet Take 1 tablet (10 mg total) by mouth daily.   aspirin EC 81 MG tablet Take 81 mg by mouth  daily. Swallow whole.   carvedilol (COREG) 25 MG tablet Take 1 tablet (25 mg total) by mouth 2 (two) times daily with a meal.   famotidine (PEPCID) 20 MG tablet Take 1 tablet (20 mg total) by mouth 2 (two) times daily.   ibuprofen (ADVIL) 800 MG tablet Take 1 tablet (800 mg total) by mouth every 6 (six) hours as needed for headache.   levocetirizine (XYZAL) 5 MG tablet Take 1 tablet (5 mg total) by mouth every evening.   linaclotide (LINZESS) 72 MCG capsule Take 1 capsule (72 mcg total) by mouth daily before breakfast.    losartan (COZAAR) 25 MG tablet TAKE TAKE 1 TABLET BY MOUTH  DAILY   ondansetron (ZOFRAN-ODT) 4 MG disintegrating tablet Take 1 tablet (4 mg total) by mouth every 8 (eight) hours as needed.   rosuvastatin (CRESTOR) 10 MG tablet Take 1 tablet (10 mg total) by mouth daily.   traZODone (DESYREL) 150 MG tablet Take 1 tablet (150 mg total) by mouth at bedtime as needed. for sleep   No facility-administered encounter medications on file as of 04/09/2023.    Review of Systems  Review of Systems  Constitutional:  Positive for appetite change.  HENT: Negative.    Cardiovascular: Negative.   Gastrointestinal:  Positive for nausea and vomiting.  Allergic/Immunologic: Negative.   Neurological: Negative.   Psychiatric/Behavioral: Negative.       Objective:   BP 95/62   Pulse 70   Resp 16   Ht 5\' 7"  (1.702 m)   Wt 184 lb 6.4 oz (83.6 kg)   LMP 05/19/2013   SpO2 100%   BMI 28.88 kg/m   Wt Readings from Last 5 Encounters:  04/09/23 184 lb 6.4 oz (83.6 kg)  03/11/23 185 lb (83.9 kg)  02/18/23 186 lb 6.4 oz (84.6 kg)  01/15/23 183 lb (83 kg)  10/02/22 188 lb 12.8 oz (85.6 kg)     Physical Exam Vitals and nursing note reviewed.  Constitutional:      General: She is not in acute distress.    Appearance: She is well-developed.  Cardiovascular:     Rate and Rhythm: Normal rate and regular rhythm.  Pulmonary:     Effort: Pulmonary effort is normal.     Breath sounds: Normal breath sounds.  Neurological:     Mental Status: She is alert and oriented to person, place, and time.       Assessment & Plan:   Nausea -     Ondansetron; Take 1 tablet (4 mg total) by mouth every 8 (eight) hours as needed.  Dispense: 20 tablet; Refill: 0   Patient Instructions  1. Nausea  - ondansetron (ZOFRAN-ODT) 4 MG disintegrating tablet; Take 1 tablet (4 mg total) by mouth every 8 (eight) hours as needed.  Dispense: 20 tablet; Refill: 0  -Patient was advised that she needs to go to the emergency  room for further evaluation and to get fluids for rehydration.  Patient is agreeable.  Nurse escorted patient via wheelchair to the emergency room  Follow up:  Follow up after hospital discharge    Return if symptoms worsen or fail to improve.   Ivonne Andrew, NP 04/09/2023

## 2023-04-09 NOTE — ED Triage Notes (Signed)
Patient stated she has been on and off vomiting for 4 weeks. Only has been able to drink Ensures. No diarrhea. No abdominal pain. Her doctor wants her to have CT scan of her stomach.

## 2023-04-10 DIAGNOSIS — R112 Nausea with vomiting, unspecified: Secondary | ICD-10-CM | POA: Diagnosis not present

## 2023-04-10 LAB — MAGNESIUM: Magnesium: 2.4 mg/dL (ref 1.7–2.4)

## 2023-04-10 MED ORDER — ONDANSETRON HCL 4 MG PO TABS: 4.0000 mg | ORAL_TABLET | Freq: Four times a day (QID) | ORAL | 0 refills | Status: DC

## 2023-04-10 NOTE — ED Notes (Addendum)
Patient only received of NS. Patient refused any more stating "its making me go to the bathroom to much, can't afford a accident".

## 2023-04-10 NOTE — Discharge Instructions (Signed)
Lab workup and imaging are reassuring, I recommend a bland diet, and small meals throughout the day, but ischemia medication called Zofran please take as prescribed.  You are slightly dehydrated my exam I recommend staying hydrated please drink plenty of fluids.  I like you to follow-up with GI for further evaluation  Come back to the emergency department if you develop chest pain, shortness of breath, severe abdominal pain, uncontrolled nausea, vomiting, diarrhea.

## 2023-04-15 ENCOUNTER — Ambulatory Visit: Payer: Self-pay | Admitting: Nurse Practitioner

## 2023-04-23 ENCOUNTER — Other Ambulatory Visit: Payer: Self-pay | Admitting: Nurse Practitioner

## 2023-04-23 DIAGNOSIS — Z1212 Encounter for screening for malignant neoplasm of rectum: Secondary | ICD-10-CM

## 2023-04-23 DIAGNOSIS — Z1211 Encounter for screening for malignant neoplasm of colon: Secondary | ICD-10-CM

## 2023-04-29 DIAGNOSIS — N1831 Chronic kidney disease, stage 3a: Secondary | ICD-10-CM | POA: Diagnosis not present

## 2023-04-29 DIAGNOSIS — I129 Hypertensive chronic kidney disease with stage 1 through stage 4 chronic kidney disease, or unspecified chronic kidney disease: Secondary | ICD-10-CM | POA: Diagnosis not present

## 2023-04-29 DIAGNOSIS — I2510 Atherosclerotic heart disease of native coronary artery without angina pectoris: Secondary | ICD-10-CM | POA: Diagnosis not present

## 2023-04-29 DIAGNOSIS — I5020 Unspecified systolic (congestive) heart failure: Secondary | ICD-10-CM | POA: Diagnosis not present

## 2023-04-29 DIAGNOSIS — F17200 Nicotine dependence, unspecified, uncomplicated: Secondary | ICD-10-CM | POA: Diagnosis not present

## 2023-04-29 DIAGNOSIS — R7303 Prediabetes: Secondary | ICD-10-CM | POA: Diagnosis not present

## 2023-04-30 LAB — LAB REPORT - SCANNED
Albumin, Urine POC: 3.5
Creatinine, POC: 55.2 mg/dL
EGFR: 61
Microalb Creat Ratio: 6

## 2023-05-03 ENCOUNTER — Other Ambulatory Visit: Payer: Self-pay | Admitting: Nephrology

## 2023-05-03 DIAGNOSIS — N1831 Chronic kidney disease, stage 3a: Secondary | ICD-10-CM

## 2023-05-10 ENCOUNTER — Ambulatory Visit
Admission: RE | Admit: 2023-05-10 | Discharge: 2023-05-10 | Disposition: A | Discharge status: Home / Self Care | Payer: 59 | Source: Ambulatory Visit | Attending: Nephrology | Admitting: Nephrology

## 2023-05-10 DIAGNOSIS — N1831 Chronic kidney disease, stage 3a: Secondary | ICD-10-CM

## 2023-05-10 DIAGNOSIS — N189 Chronic kidney disease, unspecified: Secondary | ICD-10-CM | POA: Diagnosis not present

## 2023-05-11 ENCOUNTER — Telehealth: Payer: Self-pay

## 2023-05-11 NOTE — Telephone Encounter (Signed)
Pt advised she is not interested in the cologuard. She is concerned about the pick up of the sample. KH

## 2023-05-12 ENCOUNTER — Other Ambulatory Visit: Payer: Self-pay | Admitting: Nurse Practitioner

## 2023-05-12 DIAGNOSIS — Z1211 Encounter for screening for malignant neoplasm of colon: Secondary | ICD-10-CM

## 2023-05-13 NOTE — Telephone Encounter (Signed)
Pt was advised Karen Ochoa 

## 2023-05-14 ENCOUNTER — Telehealth: Payer: Self-pay

## 2023-05-14 NOTE — Telephone Encounter (Signed)
Transition Care Management Follow-up Telephone Call Date of discharge and from where: Karen Ochoa 9/21 How have you been since you were released from the hospital? Doing ok and has followed up with PCP Any questions or concerns? No  Items Reviewed: Did the pt receive and understand the discharge instructions provided? Yes  Medications obtained and verified? Yes  Other? No  Any new allergies since your discharge? No  Dietary orders reviewed? No Do you have support at home? No     Follow up appointments reviewed:  PCP Hospital f/u appt confirmed? Yes  Scheduled to see  on  @. Specialist Hospital f/u appt confirmed? No  Scheduled to see  on  @ . Are transportation arrangements needed? No  If their condition worsens, is the pt aware to call PCP or go to the Emergency Dept.? Yes Was the patient provided with contact information for the PCP's office or ED? Yes Was to pt encouraged to call back with questions or concerns? Yes

## 2023-05-20 ENCOUNTER — Ambulatory Visit: Payer: 59 | Admitting: Gastroenterology

## 2023-05-27 ENCOUNTER — Encounter: Payer: Self-pay | Admitting: Adult Health

## 2023-05-27 ENCOUNTER — Telehealth: Payer: Self-pay | Admitting: Adult Health

## 2023-05-27 NOTE — Telephone Encounter (Signed)
Unable to reach pt over the phone, no VM. Sent letter in mail informing pt of need to reschedule 07/12/23 appt - NP out

## 2023-06-04 ENCOUNTER — Ambulatory Visit: Payer: Self-pay | Admitting: Nurse Practitioner

## 2023-06-14 ENCOUNTER — Telehealth: Payer: Self-pay

## 2023-06-14 NOTE — Telephone Encounter (Signed)
Copied from CRM [id number]. Topic: Appointments - Appointment Scheduling >> Jun 14, 2023 11:07 AM Amy B wrote: Patient/patient representative is calling to schedule an appointment for a colonoscopy and mammogram.  Please call [phone].  Pt was called and scheduled for her appointment. She will be advised of GI and mammogram at that time. Kh

## 2023-06-22 ENCOUNTER — Telehealth: Payer: Self-pay

## 2023-06-22 NOTE — Telephone Encounter (Signed)
Copied from CRM [id number]. Topic: General - Call Back - No Documentation >> Jun 22, 2023 12:13 PM Tiffany H wrote: Reason for CRM: Patient called back in response to phone call. No note left. Unable to resolve.  Returned pt call. No reason for call found. Pt was fine. KH

## 2023-06-29 ENCOUNTER — Encounter: Payer: Self-pay | Admitting: Nurse Practitioner

## 2023-06-29 ENCOUNTER — Ambulatory Visit: Payer: 59 | Admitting: Nurse Practitioner

## 2023-06-29 VITALS — BP 90/60 | HR 79 | Temp 96.4°F | Wt 184.2 lb

## 2023-06-29 DIAGNOSIS — I10 Essential (primary) hypertension: Secondary | ICD-10-CM | POA: Diagnosis not present

## 2023-06-29 DIAGNOSIS — R7301 Impaired fasting glucose: Secondary | ICD-10-CM | POA: Diagnosis not present

## 2023-06-29 LAB — GLUCOSE, POCT (MANUAL RESULT ENTRY): POC Glucose: 137 mg/dL — AB (ref 70–99)

## 2023-06-29 NOTE — Patient Instructions (Addendum)
1. Essential hypertension  - CBC - Comprehensive metabolic panel  Follow up:  Follow up in 3 months  Please go to the ED for fluids if feeling weak later this afternoon.  Patient states that she is feeling much improved after eating a snack in office today.

## 2023-06-29 NOTE — Progress Notes (Signed)
Subjective   Patient ID: Karen Ochoa, female    DOB: [birth year], 57 y.o.   MRN: [id number]  Chief Complaint  Patient presents with   over all health    Referring provider: Ivonne Andrew, NP  Karen Ochoa is a 57 y.o. 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  Patient states that she is weak and her blood pressure was low upon arrival to the office today.  Patient admits that she has not been eating well since she had her teeth removed.  Today she only drink 1 Ensure all day.  Patient is dehydrated.  She did drink 2 apple juices in office today and we did give her a bag of chips to snack on.  We discussed that she does need to eat 6 small meals per day to keep blood sugars and blood pressure stable.  She does need to stay well-hydrated.  We discussed that she does need to eat a meal when she leaves here today.  If she is still feeling weak later this afternoon she does need to go to the emergency room to get fluids.  She states that she will go if not feeling improved.  She states though that she is feeling improved after eating a snack and drinking apple juice.  Denies f/c/s, n/v/d, hemoptysis, PND, leg swelling Denies chest pain or edema      Allergies  Allergen Reactions   Penicillins Anaphylaxis and Other (See Comments)    Has patient had a PCN reaction causing immediate rash, facial/tongue/throat swelling, SOB or lightheadedness with hypotension: Yes Has patient had a PCN  reaction causing severe rash involving mucus membranes or skin necrosis: Yes Has patient had a PCN reaction that required hospitalization Yes Has patient had a PCN reaction occurring within the last 10 years: No If all of the above answers are "NO", then may proceed with Cephalosporin use.    Sulfonamide Derivatives Anaphylaxis   Clonidine Derivatives Other (See Comments)    Patient passed out shortly after ingesting this at a doctor's office on 05/08/16 (was taken in conjunction with a tablet of Labetalol.)   Flagyl [Metronidazole] Swelling and Other (See Comments)    Skin blisters   Labetalol Other (See Comments)    Patient passed out shortly after ingesting this at a doctor's office on 05/08/16 (was taken in conjunction with a tablet of Clonidine.)    Immunization History  Administered Date(s) Administered   PFIZER(Purple Top)SARS-COV-2 Vaccination 10/12/2019, 11/06/2019, 07/23/2020   Pneumococcal Polysaccharide-23 08/04/2017   Tdap 09/06/2017    Tobacco History: Social History   Tobacco Use  Smoking Status Every Day   Current packs/day: 1.00   Average packs/day: 1 pack/day for 10.0 years (10.0 ttl pk-yrs)   Types: Cigarettes  Smokeless Tobacco Never  Tobacco Comments   2 to 3 per day   Ready to quit: Not Answered Counseling given: Not Answered Tobacco comments: 2 to 3 per day   Outpatient Encounter Medications as of 06/29/2023  Medication Sig   albuterol (VENTOLIN HFA) 108 (90 Base) MCG/ACT inhaler INHALE 2 PUFFS INTO THE LUNGS EVERY 6 HOURS AS NEEDED FOR WHEEZING OR SHORTNESS OF BREATH   amLODipine (NORVASC) 10 MG tablet Take 1 tablet (10 mg total) by mouth daily.   carvedilol (COREG) 25 MG tablet Take 1 tablet (25 mg total) by mouth 2 (two) times daily with a meal.   famotidine (PEPCID) 20 MG tablet Take 1 tablet (20 mg total) by mouth 2 (two) times daily.   levocetirizine (XYZAL) 5 MG tablet Take 1 tablet (5 mg total) by mouth every evening.   linaclotide (LINZESS)  72 MCG capsule Take 1 capsule (72 mcg total) by mouth daily before breakfast.   losartan (COZAAR) 25 MG tablet TAKE TAKE 1 TABLET BY MOUTH  DAILY   ondansetron (ZOFRAN) 4 MG tablet Take 1 tablet (4 mg total) by mouth every 6 (six) hours.   ondansetron (ZOFRAN-ODT) 4 MG disintegrating tablet Take 1 tablet (4 mg total) by mouth every 8 (eight) hours as needed.   rosuvastatin (CRESTOR) 10 MG tablet Take 1 tablet (10 mg total) by mouth daily.   traZODone (DESYREL) 150 MG tablet Take 1 tablet (150 mg total) by mouth at bedtime as needed. for sleep   aspirin EC 81 MG tablet Take 81 mg by mouth daily. Swallow whole. (Patient not taking: Reported on 06/29/2023)   ibuprofen (ADVIL) 800 MG tablet Take 1 tablet (800 mg total) by mouth every 6 (six) hours as needed for headache. (Patient not taking: Reported on 06/29/2023)   No facility-administered encounter medications on file as of 06/29/2023.    Review of Systems  Review of Systems  Constitutional: Negative.   HENT: Negative.    Cardiovascular: Negative.   Gastrointestinal: Negative.   Allergic/Immunologic: Negative.   Neurological: Negative.   Psychiatric/Behavioral: Negative.       Objective:   BP 90/60   Pulse 79   Temp (!) 96.4 F (35.8 C)   Wt 184 lb 3.2 oz (83.6 kg)   LMP 05/19/2013   SpO2 99%   BMI 28.85 kg/m   Wt Readings from Last 5 Encounters:  06/29/23 184 lb 3.2 oz (83.6 kg)  04/09/23 184 lb 6.4 oz (83.6 kg)  03/11/23 185 lb (83.9 kg)  02/18/23 186 lb 6.4 oz (84.6 kg)  01/15/23 183 lb (83 kg)     Physical Exam Vitals and nursing note reviewed.  Constitutional:      General: She is not in acute distress.    Appearance: She is well-developed.  Cardiovascular:     Rate and Rhythm: Normal rate and regular rhythm.  Pulmonary:     Effort: Pulmonary effort is normal.     Breath sounds: Normal breath sounds.  Neurological:     Mental Status: She is alert and oriented to person, place, and time.        Assessment & Plan:   Essential hypertension -     CBC -     Comprehensive metabolic panel  Impaired fasting glucose -     POCT glucose (manual entry)   Patient Instructions  1. Essential hypertension  - CBC - Comprehensive metabolic panel  Follow up:  Follow up in 3 months  Please go to the ED for fluids if feeling weak later this afternoon.  Patient states that she is feeling much improved after eating a snack in office today.    Return in about 3 months (around 09/27/2023).   Ivonne Andrew, NP 07/02/2023

## 2023-07-02 ENCOUNTER — Encounter: Payer: Self-pay | Admitting: Nurse Practitioner

## 2023-07-12 ENCOUNTER — Ambulatory Visit: Payer: 59 | Admitting: Adult Health

## 2023-07-29 ENCOUNTER — Telehealth: Payer: Self-pay

## 2023-07-29 NOTE — Telephone Encounter (Signed)
 Called pt to find out if she would like to schedule 08/25/23 for her mammogram. No answer and could not lvm. KH

## 2023-08-25 ENCOUNTER — Inpatient Hospital Stay: Admission: RE | Admit: 2023-08-25 | Payer: 59 | Source: Ambulatory Visit

## 2023-08-25 ENCOUNTER — Ambulatory Visit: Payer: 59 | Admitting: Adult Health

## 2023-09-27 ENCOUNTER — Ambulatory Visit: Payer: Self-pay | Admitting: Nurse Practitioner

## 2023-09-29 ENCOUNTER — Encounter: Payer: Self-pay | Admitting: Nurse Practitioner

## 2023-09-29 ENCOUNTER — Ambulatory Visit (INDEPENDENT_AMBULATORY_CARE_PROVIDER_SITE_OTHER): Payer: Self-pay | Admitting: Nurse Practitioner

## 2023-09-29 DIAGNOSIS — J302 Other seasonal allergic rhinitis: Secondary | ICD-10-CM | POA: Diagnosis not present

## 2023-09-29 MED ORDER — LEVOCETIRIZINE DIHYDROCHLORIDE 5 MG PO TABS
5.0000 mg | ORAL_TABLET | Freq: Every evening | ORAL | 2 refills | Status: DC
Start: 2023-09-29 — End: 2024-02-08

## 2023-09-29 NOTE — Progress Notes (Signed)
 Subjective   Patient ID: Karen Ochoa, female    DOB: 1966/05/19, 58 y.o.   MRN: [id number]  Chief Complaint  Patient presents with   Medical Management of Chronic Issues    Referring provider: Ivonne Andrew, NP  OTELIA HETTINGER is a 58 y.o. 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 States that she does need medication refills. She states that she has been doing well. Follows a low salt diet, but is very sedentary.   Her weight does remain stable. Needs refill on allergy medications.   Denies f/c/s, n/v/d, hemoptysis, PND, leg swelling. Denies chest pain or edema.   Patient declines labs today. We will check these at next visit.   Allergies  Allergen Reactions   Penicillins Anaphylaxis and Other (See Comments)    Has patient had a PCN reaction causing immediate rash, facial/tongue/throat swelling, SOB or lightheadedness with hypotension: Yes Has patient had a PCN reaction causing severe rash involving mucus membranes or skin necrosis: Yes Has patient had a PCN reaction that required hospitalization Yes Has patient had a PCN reaction occurring within the last 10 years: No If all of the above answers are "NO", then may proceed with Cephalosporin use.    Sulfonamide Derivatives Anaphylaxis   Clonidine Derivatives Other (See Comments)    Patient passed out shortly after ingesting this at a doctor's office on 05/08/16 (was taken in conjunction with a tablet of Labetalol.)    Flagyl [Metronidazole] Swelling and Other (See Comments)    Skin blisters   Labetalol Other (See Comments)    Patient passed out shortly after ingesting this at a doctor's office on 05/08/16 (was taken in conjunction with a tablet of Clonidine.)    Immunization History  Administered Date(s) Administered   PFIZER(Purple Top)SARS-COV-2 Vaccination 10/12/2019, 11/06/2019, 07/23/2020   Pneumococcal Polysaccharide-23 08/04/2017   Tdap 09/06/2017    Tobacco History: Social History   Tobacco Use  Smoking Status Every Day   Current packs/day: 1.00   Average packs/day: 1 pack/day for 10.0 years (10.0 ttl pk-yrs)   Types: Cigarettes  Smokeless Tobacco Never  Tobacco Comments   2 to 3 per day   Ready to quit: Yes Counseling given: Yes Tobacco comments: 2 to 3 per day   Outpatient Encounter Medications as of 09/29/2023  Medication Sig   albuterol (VENTOLIN HFA) 108 (90 Base) MCG/ACT inhaler INHALE 2 PUFFS INTO THE LUNGS EVERY 6 HOURS AS NEEDED FOR WHEEZING OR SHORTNESS OF BREATH   amLODipine (NORVASC) 10 MG tablet Take 1 tablet (10 mg total) by mouth daily.   carvedilol (COREG) 25 MG tablet Take 1 tablet (25 mg total) by mouth 2 (two) times daily with a meal.   famotidine (PEPCID) 20 MG tablet Take 1 tablet (20 mg total) by mouth 2 (two) times daily.   linaclotide (LINZESS) 72 MCG capsule Take 1 capsule (72 mcg total) by mouth daily before breakfast.   losartan (COZAAR) 25 MG tablet TAKE TAKE 1 TABLET  BY MOUTH  DAILY   rosuvastatin (CRESTOR) 10 MG tablet Take 1 tablet (10 mg total) by mouth daily.   traZODone (DESYREL) 150 MG tablet Take 1 tablet (150 mg total) by mouth at bedtime as needed. for sleep   [DISCONTINUED] levocetirizine (XYZAL) 5 MG tablet Take 1 tablet (5 mg total) by mouth every evening.   aspirin EC 81 MG tablet Take 81 mg by mouth daily. Swallow whole. (Patient not taking: Reported on 09/29/2023)   ibuprofen (ADVIL) 800 MG tablet Take 1 tablet (800 mg total) by mouth  every 6 (six) hours as needed for headache. (Patient not taking: Reported on 09/29/2023)   levocetirizine (XYZAL) 5 MG tablet Take 1 tablet (5 mg total) by mouth every evening.   ondansetron (ZOFRAN) 4 MG tablet Take 1 tablet (4 mg total) by mouth every 6 (six) hours. (Patient not taking: Reported on 09/29/2023)   ondansetron (ZOFRAN-ODT) 4 MG disintegrating tablet Take 1 tablet (4 mg total) by mouth every 8 (eight) hours as needed. (Patient not taking: Reported on 09/29/2023)   No facility-administered encounter medications on file as of 09/29/2023.    Review of Systems  Review of Systems  Constitutional: Negative.   HENT: Negative.    Cardiovascular: Negative.   Gastrointestinal: Negative.   Allergic/Immunologic: Negative.   Neurological: Negative.   Psychiatric/Behavioral: Negative.       Objective:   BP 105/77   Pulse 69   Temp 97.7 F (36.5 C) (Oral)   Wt 181 lb 6.4 oz (82.3 kg)   LMP 05/19/2013   SpO2 98%   BMI 28.41 kg/m   Wt Readings from Last 5 Encounters:  09/29/23 181 lb 6.4 oz (82.3 kg)  06/29/23 184 lb 3.2 oz (83.6 kg)  04/09/23 184 lb 6.4 oz (83.6 kg)  03/11/23 185 lb (83.9 kg)  02/18/23 186 lb 6.4 oz (84.6 kg)     Physical Exam Vitals and nursing note reviewed.  Constitutional:      General: She is not in acute distress.    Appearance: She is well-developed.  Cardiovascular:     Rate and Rhythm: Normal rate and regular rhythm.  Pulmonary:     Effort: Pulmonary effort is normal.     Breath sounds: Normal breath sounds.  Neurological:     Mental Status: She is alert and oriented to person, place, and time.       Assessment & Plan:   Seasonal allergies -     Levocetirizine Dihydrochloride; Take 1 tablet (5 mg total) by mouth every evening.  Dispense: 90 tablet; Refill: 2     Return in about 3 months (around 12/30/2023).   Ivonne Andrew, NP 09/29/2023

## 2023-10-08 ENCOUNTER — Other Ambulatory Visit: Payer: Self-pay | Admitting: Nurse Practitioner

## 2023-10-08 DIAGNOSIS — K219 Gastro-esophageal reflux disease without esophagitis: Secondary | ICD-10-CM

## 2023-10-21 ENCOUNTER — Telehealth: Payer: Self-pay | Admitting: Nurse Practitioner

## 2023-10-21 NOTE — Telephone Encounter (Signed)
 Copied from CRM [id number]. Topic: General - Other >> Oct 21, 2023 12:39 PM Ja-Kwan M wrote: Reason for CRM: Patient stated that she would like to make provider aware that she needs a home health nurse and would like to bring in the paperwork for the provider to complete. Call back# [phone]

## 2023-10-27 ENCOUNTER — Other Ambulatory Visit: Payer: Self-pay | Admitting: Nurse Practitioner

## 2023-10-27 DIAGNOSIS — I10 Essential (primary) hypertension: Secondary | ICD-10-CM

## 2023-10-27 DIAGNOSIS — I25118 Atherosclerotic heart disease of native coronary artery with other forms of angina pectoris: Secondary | ICD-10-CM

## 2023-11-10 DIAGNOSIS — I129 Hypertensive chronic kidney disease with stage 1 through stage 4 chronic kidney disease, or unspecified chronic kidney disease: Secondary | ICD-10-CM | POA: Diagnosis not present

## 2023-11-10 DIAGNOSIS — N1831 Chronic kidney disease, stage 3a: Secondary | ICD-10-CM | POA: Diagnosis not present

## 2023-11-10 DIAGNOSIS — R110 Nausea: Secondary | ICD-10-CM | POA: Diagnosis not present

## 2023-11-10 DIAGNOSIS — I5020 Unspecified systolic (congestive) heart failure: Secondary | ICD-10-CM | POA: Diagnosis not present

## 2023-11-11 DIAGNOSIS — N1831 Chronic kidney disease, stage 3a: Secondary | ICD-10-CM | POA: Diagnosis not present

## 2023-11-18 ENCOUNTER — Telehealth: Payer: Self-pay

## 2023-11-18 NOTE — Telephone Encounter (Unsigned)
 Copied from CRM [id number]. Topic: General - Other >> Nov 18, 2023 12:12 PM Santiya F wrote: Reason for CRM: Patient is calling in because she dropped off some paperwork that needed to be completed and faxed to [facility]. Patient says [employer] has not received them. Patient says she can pick the paperwork up if needed. Please follow up with patient.

## 2023-12-04 ENCOUNTER — Other Ambulatory Visit: Payer: Self-pay | Admitting: Nurse Practitioner

## 2023-12-04 DIAGNOSIS — I10 Essential (primary) hypertension: Secondary | ICD-10-CM

## 2023-12-06 DIAGNOSIS — N1831 Chronic kidney disease, stage 3a: Secondary | ICD-10-CM | POA: Diagnosis not present

## 2023-12-22 ENCOUNTER — Other Ambulatory Visit: Payer: Self-pay | Admitting: Nurse Practitioner

## 2023-12-22 DIAGNOSIS — G4700 Insomnia, unspecified: Secondary | ICD-10-CM

## 2023-12-22 DIAGNOSIS — K581 Irritable bowel syndrome with constipation: Secondary | ICD-10-CM

## 2023-12-23 NOTE — Telephone Encounter (Signed)
 Please advise [facility]

## 2024-01-11 ENCOUNTER — Other Ambulatory Visit: Payer: Self-pay | Admitting: Nurse Practitioner

## 2024-01-11 DIAGNOSIS — I10 Essential (primary) hypertension: Secondary | ICD-10-CM

## 2024-01-13 ENCOUNTER — Ambulatory Visit: Payer: Self-pay | Admitting: Nurse Practitioner

## 2024-01-14 ENCOUNTER — Ambulatory Visit (INDEPENDENT_AMBULATORY_CARE_PROVIDER_SITE_OTHER): Payer: Self-pay | Admitting: Nurse Practitioner

## 2024-01-14 ENCOUNTER — Encounter: Payer: Self-pay | Admitting: Nurse Practitioner

## 2024-01-14 VITALS — BP 71/42 | HR 85 | Temp 97.7°F | Wt 169.0 lb

## 2024-01-14 DIAGNOSIS — R630 Anorexia: Secondary | ICD-10-CM | POA: Diagnosis not present

## 2024-01-14 DIAGNOSIS — Z1211 Encounter for screening for malignant neoplasm of colon: Secondary | ICD-10-CM

## 2024-01-14 DIAGNOSIS — R112 Nausea with vomiting, unspecified: Secondary | ICD-10-CM | POA: Diagnosis not present

## 2024-01-14 NOTE — Progress Notes (Signed)
 Subjective   Patient ID: Karen Ochoa, female    DOB: [birth year], 58 y.o.   MRN: [id number]  Chief Complaint  Patient presents with   Hypertension    Referring provider: Oley Bascom RAMAN, NP  Karen Ochoa is a 58 y.o. 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  She continues to have issues with decreased appetite.  She states that when she does eat a meal she vomits.  We will place a referral to GI for further evaluation.  The patient is requesting home health.  She does have a history of stroke.  Will place this order today for help with activities of daily living and meals.Denies f/c/s, n/v/d, hemoptysis, PND, leg swelling Denies chest pain or edema     Allergies  Allergen Reactions   Penicillins Anaphylaxis and Other (See Comments)    Has patient had a PCN reaction causing immediate rash, facial/tongue/throat swelling, SOB or lightheadedness with hypotension: Yes Has patient had a PCN reaction causing severe rash involving mucus membranes or skin necrosis: Yes Has patient had a PCN reaction that required hospitalization Yes Has patient had a PCN reaction occurring within the last 10 years: No If all of the above answers are NO, then may proceed with Cephalosporin use.    Sulfonamide Derivatives Anaphylaxis   Clonidine Derivatives Other (See Comments)    Patient passed out shortly after ingesting this at a doctor's office on 05/08/16 (was taken in conjunction with a tablet of  Labetalol.)   Flagyl  [Metronidazole ] Swelling and Other (See Comments)    Skin blisters   Labetalol Other (See Comments)    Patient passed out shortly after ingesting this at a doctor's office on 05/08/16 (was taken in conjunction with a tablet of Clonidine.)    Immunization History  Administered Date(s) Administered   PFIZER(Purple Top)SARS-COV-2 Vaccination 10/12/2019, 11/06/2019, 07/23/2020   Pneumococcal Polysaccharide-23 08/04/2017   Tdap 09/06/2017    Tobacco History: Social History   Tobacco Use  Smoking Status Every Day   Current packs/day: 1.00   Average packs/day: 1 pack/day for 10.0 years (10.0 ttl pk-yrs)   Types: Cigarettes  Smokeless Tobacco Never  Tobacco Comments   2 to 3 per day   Ready to quit: Not Answered Counseling given: Yes Tobacco comments: 2 to 3 per day   Outpatient Encounter Medications as of 01/14/2024  Medication Sig   albuterol  (VENTOLIN  HFA) 108 (90 Base) MCG/ACT inhaler INHALE 2 PUFFS INTO THE LUNGS EVERY 6 HOURS AS NEEDED FOR WHEEZING OR SHORTNESS OF BREATH   amLODipine  (NORVASC ) 10 MG tablet TAKE 1 TABLET BY MOUTH DAILY   carvedilol  (COREG ) 25 MG tablet TAKE 1 TABLET BY MOUTH TWICE  DAILY WITH MEALS   famotidine  (PEPCID ) 20 MG tablet TAKE 1 TABLET BY MOUTH TWICE  DAILY   levocetirizine (XYZAL ) 5 MG tablet Take 1 tablet (5 mg total) by mouth every evening.   LINZESS  72 MCG capsule TAKE 1 CAPSULE BY MOUTH DAILY  BEFORE BREAKFAST   losartan  (COZAAR )  25 MG tablet TAKE 1 TABLET BY MOUTH DAILY   rosuvastatin  (CRESTOR ) 10 MG tablet TAKE 1 TABLET BY MOUTH DAILY   traZODone  (DESYREL ) 150 MG tablet TAKE 1 TABLET BY MOUTH AT  BEDTIME AS NEEDED FOR SLEEP   aspirin  EC 81 MG tablet Take 81 mg by mouth daily. Swallow whole. (Patient not taking: Reported on 09/29/2023)   ibuprofen  (ADVIL ) 800 MG tablet Take 1 tablet (800 mg total) by mouth every 6 (six) hours as needed for headache. (Patient not taking: Reported on 09/29/2023)   ondansetron  (ZOFRAN ) 4 MG  tablet Take 1 tablet (4 mg total) by mouth every 6 (six) hours. (Patient not taking: Reported on 09/29/2023)   ondansetron  (ZOFRAN -ODT) 4 MG disintegrating tablet Take 1 tablet (4 mg total) by mouth every 8 (eight) hours as needed. (Patient not taking: Reported on 09/29/2023)   No facility-administered encounter medications on file as of 01/14/2024.    Review of Systems  Review of Systems  Constitutional: Negative.   HENT: Negative.    Cardiovascular: Negative.   Gastrointestinal: Negative.   Allergic/Immunologic: Negative.   Neurological: Negative.   Psychiatric/Behavioral: Negative.       Objective:   BP (!) 71/42   Pulse 85   Temp 97.7 F (36.5 C) (Oral)   Wt 169 lb (76.7 kg)   LMP 05/19/2013   SpO2 97%   BMI 26.47 kg/m   Wt Readings from Last 5 Encounters:  01/14/24 169 lb (76.7 kg)  09/29/23 181 lb 6.4 oz (82.3 kg)  06/29/23 184 lb 3.2 oz (83.6 kg)  04/09/23 184 lb 6.4 oz (83.6 kg)  03/11/23 185 lb (83.9 kg)     Physical Exam Vitals and nursing note reviewed.  Constitutional:      General: She is not in acute distress.    Appearance: She is well-developed.   Cardiovascular:     Rate and Rhythm: Normal rate and regular rhythm.  Pulmonary:     Effort: Pulmonary effort is normal.     Breath sounds: Normal breath sounds.   Neurological:     Mental Status: She is alert and oriented to person, place, and time.       Assessment & Plan:   Colon cancer screening -     Cologuard -     Ambulatory referral to Home Health  Loss of appetite -     Ambulatory referral to Gastroenterology  Nausea and vomiting, unspecified vomiting type -     Ambulatory referral to Gastroenterology     Return in about 6 months (around 07/15/2024).   Bascom GORMAN Borer, NP 01/14/2024

## 2024-01-14 NOTE — Patient Instructions (Signed)
 1. Colon cancer screening (Primary)  - Cologuard - Ambulatory referral to Home Health  2. Loss of appetite  - Ambulatory referral to Gastroenterology  3. Nausea and vomiting, unspecified vomiting type  - Ambulatory referral to Gastroenterology

## 2024-01-26 ENCOUNTER — Telehealth: Payer: Self-pay

## 2024-01-26 NOTE — Telephone Encounter (Signed)
 Copied from CRM [id number]. Topic: General - Other >> Jan 25, 2024 12:17 PM Emylou G wrote: Reason for CRM: Pls call patient.. returning our call.. couldn't find msg?   Either of you call this pt. KH

## 2024-01-27 NOTE — Telephone Encounter (Signed)
 Copied from CRM [id number]. Topic: General - Other >> Jan 27, 2024 11:48 AM Carlatta H wrote: Reason for CRM: Patient called to state she does not want to participate in the cologuard testing//she keeps getting testing kits but doesn't want them//

## 2024-02-08 ENCOUNTER — Ambulatory Visit: Payer: Self-pay

## 2024-02-08 ENCOUNTER — Encounter: Payer: Self-pay | Admitting: Nurse Practitioner

## 2024-02-08 ENCOUNTER — Ambulatory Visit: Payer: Self-pay | Admitting: Nurse Practitioner

## 2024-02-08 ENCOUNTER — Telehealth: Admitting: Nurse Practitioner

## 2024-02-08 VITALS — Ht 67.0 in | Wt 169.0 lb

## 2024-02-08 DIAGNOSIS — Z8673 Personal history of transient ischemic attack (TIA), and cerebral infarction without residual deficits: Secondary | ICD-10-CM | POA: Diagnosis not present

## 2024-02-08 DIAGNOSIS — Z7409 Other reduced mobility: Secondary | ICD-10-CM

## 2024-02-08 DIAGNOSIS — J302 Other seasonal allergic rhinitis: Secondary | ICD-10-CM | POA: Diagnosis not present

## 2024-02-08 DIAGNOSIS — Z789 Other specified health status: Secondary | ICD-10-CM | POA: Diagnosis not present

## 2024-02-08 MED ORDER — LEVOCETIRIZINE DIHYDROCHLORIDE 5 MG PO TABS: 5.0000 mg | ORAL_TABLET | Freq: Every evening | ORAL | 2 refills | Status: DC

## 2024-02-08 NOTE — Assessment & Plan Note (Signed)
 Levocetirizine 5 mg daily refilled

## 2024-02-08 NOTE — Telephone Encounter (Signed)
 FYI Only or Action Required?: Action required by provider: request for appointment.  Patient was last seen in primary care on 01/14/2024 by Oley Bascom RAMAN, NP.  Called Nurse Triage reporting Depression.  Symptoms began several days ago.  Interventions attempted: Nothing.  Symptoms are: gradually worsening.  Triage Disposition: See HCP Within 4 Hours (Or PCP Triage)  Patient/caregiver understands and will follow disposition?: NoCopied from CRM [id number]. Topic: Clinical - Red Word Triage >> Feb 08, 2024  1:01 PM Winona R wrote: Pt calling as she needs daily assistance and waiting on home health assistance. I provided the pt with [facility] phone number as listed on her referral and she was told they can't help her. The pt states she sits in one spot all day and is depressed can't bathe herself or do anything for herself. Reason for Disposition  [1] SEVERE pain (e.g., excruciating, unable to do any normal activities) AND [2] not improved after 2 hours of pain medicine  Answer Assessment - Initial Assessment Questions 1. ONSET: When did the pain start?      Not sure 2. LOCATION: Where is the pain located?      Both legs 3. PAIN: How bad is the pain?    (Scale 1-10; or mild, moderate, severe)     9 4. WORK OR EXERCISE: Has there been any recent work or exercise that involved this part of the body?      Na  5. CAUSE: What do you think is causing the leg pain?     No sure 6. OTHER SYMPTOMS: Do you have any other symptoms? (e.g., chest pain, back pain, breathing difficulty, swelling, rash, fever, numbness, weakness)     vomiting   Husband takes care wife and she is unable to do for herself. Husband works daily. Pt stated, I lay here for 8 hours until my husband gets home to help me. My legs shake like I'm going to fall so I don't move. I need help. I can't eat because when I do I vomit. Pt stated she can do virtual appt and one was made. Pt then stated she doesn't have  mychart set up. Pt stated it has to be after 1500 so her husband can take her to appt. No appts until 8/12. Please reach out to pt about concerns and  referrals. She is very upset. She was told by [facility] they can't give her what she needs. Pt is looking for options.  Protocols used: Leg Pain-A-AH

## 2024-02-08 NOTE — Assessment & Plan Note (Signed)
 Patient referred for home health services

## 2024-02-08 NOTE — Telephone Encounter (Signed)
 Patient being warm transferred by agent and she states the office just called her back and asked about why the virtual visit was cancelled and states they were put her back on the schedule in 5 minutes. Confirmed with patient no further assistance needed at this time.

## 2024-02-08 NOTE — Progress Notes (Signed)
  Virtual Visit via video note  I connected with Karen Ochoa @ on 02/08/24 at  1:20 PM EDT by video and verified that I am speaking with the correct person using two identifiers I spent 7 minutes on this video encounter  Location: Patient: home  Provider: office   I discussed the limitations, risks, security and privacy concerns of performing an evaluation and management service by telephone and the availability of in person appointments. I also discussed with the patient that there may be a patient responsible charge related to this service. The patient expressed understanding and agreed to proceed.   History of Present Illness: Karen Ochoa  has a past medical history of Asthma (2017), CAD in native artery, Fibroids, GERD (gastroesophageal reflux disease), Headache, HLD (hyperlipidemia) (07/18/2017), MRSA infection, Hypertension, Insomnia due to medical condition, Ischemic cardiomyopathy, Stroke (HCC) (07/18/2017), and TIA (transient ischemic attack).  Patient presents to request for a referral for home health services.  She lives with her husband at home but she is by herself 8 hours a day, stated that she needs help with bathing and cooking, uses a walker sometimes.  Stated that her husband used to help with these activities but he is now working and not able to help.  She has been needing help with activities of daily living since 2019 when she had CABG   Observations/Objective: Patient alert and oriented no sign of distress noted  Assessment and Plan: Seasonal allergies Levocetirizine 5 mg daily refilled  Impaired mobility and ADLs Patient referred for home health services    Follow Up Instructions:    I discussed the assessment and treatment plan with the patient. The patient was provided an opportunity to ask questions and all were answered. The patient agreed with the plan and demonstrated an understanding of the instructions.   The patient was advised to call back or seek an  in-person evaluation if the symptoms worsen or if the condition fails to improve as anticipated.

## 2024-03-08 ENCOUNTER — Encounter: Payer: Self-pay | Admitting: Nurse Practitioner

## 2024-03-09 ENCOUNTER — Ambulatory Visit: Payer: Self-pay

## 2024-04-13 ENCOUNTER — Ambulatory Visit: Payer: Self-pay

## 2024-04-13 ENCOUNTER — Telehealth: Payer: Self-pay | Admitting: Nurse Practitioner

## 2024-04-13 NOTE — Telephone Encounter (Signed)
 Copied from CRM #[id number]. Topic: Referral - Question >> Apr 13, 2024 10:30 AM Winona R wrote:  pt mentioned she was suppose to receive in home care but instead someone came for wound care, but she doesn't have any wounds. She really need assistance as her husband use to help but they are now separated. Pt transferred to Nurse triage for a fall in home on 03/12/2024

## 2024-04-13 NOTE — Telephone Encounter (Signed)
 FYI Only or Action Required?: FYI only for provider. Scheduled for acute appointment on 04/18/2024 at 3:40 PM-earliest appointment patient is able to make due to transportation.   Patient was last seen in primary care on 02/08/2024 by Paseda, Folashade R, FNP.  Called Nurse Triage reporting Fall.  Symptoms began about a month ago.  Interventions attempted: Rest, hydration, or home remedies.  Symptoms are: unchanged.  Triage Disposition: See PCP Within 2 Weeks  Patient/caregiver understands and will follow disposition?: Yes  Copied from CRM [id number]. Topic: Clinical - Red Word Triage >> Apr 13, 2024 10:25 AM Karen R wrote: Pt fell and has pain all over her body, Pt also states she's not eating and only drinking ensure. The pt mentioned she was suppose to receive in home care but instead someone came for wound care, but she doesn't have any wounds. [name] does seem emotionally challenged as it sound like she started to get emotional then perfectly fine right after. * Pt is interested in being seen in office. Reason for Disposition  [1] Falling (two or more unexpected falls) AND [2] in past year  Answer Assessment - Initial Assessment Questions Patient states these symptoms have been going on for over a month. Patient is scheduled for Tuesday 04/18/2024 at 3:40 PM due to needing time for transportation. Patient is given strict ED precautions.   1. MECHANISM: How did the fall happen?     Fall occurred last night-per patient report, patient either lost her balance and fell backwards or patient may have passed out-unsure of what happened. Patient states she fell backwards and hit her head and shoulder 2. DOMESTIC VIOLENCE AND ELDER ABUSE SCREENING: Did you fall because someone pushed you or tried to hurt you? If Yes, ask: Are you safe now?     no 3. ONSET: When did the fall happen? (e.g., minutes, hours, or days ago)     Occurred last night 4. LOCATION: What part of the body hit  the ground? (e.g., back, buttocks, head, hips, knees, hands, head, stomach)     Head and shoulder-has pain to entire body 5. INJURY: Did you hurt (injure) yourself when you fell? If Yes, ask: What did you injure? Tell me more about this? (e.g., body area; type of injury; pain severity)     Yes-bruising to shoulder 6. PAIN: Is there any pain? If Yes, ask: How bad is the pain? (e.g., Scale 0-10; or none, mild,      8 out of 10 7. SIZE: For cuts, bruises, or swelling, ask: How large is it? (e.g., inches or centimeters)      Some bruising to shoulder 9. OTHER SYMPTOMS: Do you have any other symptoms? (e.g., dizziness, fever, weakness; new-onset or worsening).      No other symptoms.  10. CAUSE: What do you think caused the fall (or falling)? (e.g., dizzy spell, tripped)       Possible Dizzy spell  Protocols used: Falls and [facility]

## 2024-04-14 NOTE — Telephone Encounter (Signed)
 Copied from CRM #[id number]. Topic: Referral - Question >> Apr 13, 2024 10:30 AM Karen Ochoa wrote:  pt mentioned she was suppose to receive in home care but instead someone came for wound care, but she doesn't have any wounds. She really need assistance as her husband use to help but they are now separated. Pt transferred to Nurse triage for a fall in home on 03/12/2024

## 2024-04-18 ENCOUNTER — Ambulatory Visit: Payer: Self-pay | Admitting: Nurse Practitioner

## 2024-04-18 VITALS — BP 84/62 | HR 77 | Ht 67.0 in | Wt 161.0 lb

## 2024-04-18 DIAGNOSIS — I10 Essential (primary) hypertension: Secondary | ICD-10-CM | POA: Diagnosis not present

## 2024-04-18 DIAGNOSIS — Z13220 Encounter for screening for lipoid disorders: Secondary | ICD-10-CM

## 2024-04-18 DIAGNOSIS — I959 Hypotension, unspecified: Secondary | ICD-10-CM | POA: Insufficient documentation

## 2024-04-18 DIAGNOSIS — R7303 Prediabetes: Secondary | ICD-10-CM | POA: Diagnosis not present

## 2024-04-18 DIAGNOSIS — R42 Dizziness and giddiness: Secondary | ICD-10-CM | POA: Insufficient documentation

## 2024-04-18 DIAGNOSIS — W19XXXA Unspecified fall, initial encounter: Secondary | ICD-10-CM | POA: Diagnosis not present

## 2024-04-18 DIAGNOSIS — Z789 Other specified health status: Secondary | ICD-10-CM

## 2024-04-18 DIAGNOSIS — Z7409 Other reduced mobility: Secondary | ICD-10-CM

## 2024-04-18 DIAGNOSIS — R630 Anorexia: Secondary | ICD-10-CM | POA: Insufficient documentation

## 2024-04-18 DIAGNOSIS — R110 Nausea: Secondary | ICD-10-CM | POA: Diagnosis not present

## 2024-04-18 MED ORDER — ONDANSETRON HCL 4 MG PO TABS: 4.0000 mg | ORAL_TABLET | Freq: Four times a day (QID) | ORAL | 0 refills | Status: AC

## 2024-04-18 MED ORDER — AMLODIPINE BESYLATE 5 MG PO TABS
5.0000 mg | ORAL_TABLET | Freq: Every day | ORAL | 1 refills | Status: AC
Start: 2024-04-18 — End: ?

## 2024-04-18 NOTE — Progress Notes (Signed)
 Acute Office Visit  Subjective:     Patient ID: Karen Ochoa, female    DOB: 1966-02-08, 58 y.o.   MRN: [id number]  Chief Complaint  Patient presents with   Fall    Last Thursday; does report LOC and head injury; feeling better now; did not seek care/call EMS/ED   Anorexia    Unable to eat due to nausea/emesis, mainly drinking Ensure     Fall Associated symptoms include nausea and vomiting. Pertinent negatives include no abdominal pain, fever, headaches or numbness.    Discussed the use of AI scribe software for clinical note transcription with the patient, who gave verbal consent to proceed.  History of Present Illness Karen Ochoa is a 58 year old female  has a past medical history of Asthma (2017), CAD in native artery, Fibroids, GERD (gastroesophageal reflux disease), Headache, HLD (hyperlipidemia) (07/18/2017), MRSA infection, Hypertension, Insomnia due to medical condition, Ischemic cardiomyopathy, Stroke (HCC) (07/18/2017), and TIA (transient ischemic attack). who presents with a fall.  She experienced a fall on Thursday while trying to close the window blinds and did not seek hospital care following the incident.she denies injury  She experiences dizziness, particularly when standing, and feels as though she might pass out. This dizziness has impaired her ability to perform daily activities such as cooking and bathing. Has hypertension her current medications include amlodipine  10 mg daily, carvedilol  25 mg twice daily, she does not have a blood pressure monitor so she has not been checking her blood pressure at home  She mentions not having eaten anything today and has been experiencing a decreased appetite for the last couple of visits.  vomiting occurs only if she forces herself to eat. No stomach pain is present. Her decreased nutritional intake may be contributing to her overall weakness and dizziness.  She was referred to GI but has not been seen    Assessment &  Plan    Review of Systems  Constitutional:  Negative for appetite change, chills, fatigue and fever.  HENT:  Negative for congestion, postnasal drip, rhinorrhea and sneezing.   Respiratory:  Negative for cough, shortness of breath and wheezing.   Cardiovascular:  Negative for chest pain, palpitations and leg swelling.  Gastrointestinal:  Positive for nausea and vomiting. Negative for abdominal pain and constipation.  Genitourinary:  Negative for difficulty urinating, dysuria, flank pain and frequency.  Musculoskeletal:  Negative for arthralgias, back pain, joint swelling and myalgias.  Skin:  Negative for color change, pallor, rash and wound.  Neurological:  Positive for dizziness. Negative for facial asymmetry, weakness, numbness and headaches.  Psychiatric/Behavioral:  Negative for behavioral problems, confusion, self-injury and suicidal ideas.         Objective:    BP (!) 84/62   Pulse 77   Ht 5' 7 (1.702 m)   Wt 161 lb (73 kg)   LMP 05/19/2013   SpO2 99%   BMI 25.22 kg/m    Physical Exam Vitals and nursing note reviewed.  Constitutional:      General: She is not in acute distress.    Appearance: Normal appearance. She is not ill-appearing, toxic-appearing or diaphoretic.  HENT:     Mouth/Throat:     Pharynx: No posterior oropharyngeal erythema.  Eyes:     General: No scleral icterus.       Right eye: No discharge.        Left eye: No discharge.     Extraocular Movements: Extraocular movements intact.  Conjunctiva/sclera: Conjunctivae normal.  Cardiovascular:     Rate and Rhythm: Normal rate and regular rhythm.     Pulses: Normal pulses.     Heart sounds: Normal heart sounds. No murmur heard.    No friction rub. No gallop.  Pulmonary:     Effort: Pulmonary effort is normal. No respiratory distress.     Breath sounds: Normal breath sounds. No stridor. No wheezing, rhonchi or rales.  Chest:     Chest wall: No tenderness.  Abdominal:     General: There is  no distension.     Palpations: Abdomen is soft.     Tenderness: There is no abdominal tenderness. There is no right CVA tenderness, left CVA tenderness or guarding.  Musculoskeletal:        General: No swelling, tenderness, deformity or signs of injury.     Right lower leg: No edema.     Left lower leg: No edema.  Skin:    General: Skin is warm and dry.     Capillary Refill: Capillary refill takes less than 2 seconds.     Coloration: Skin is not jaundiced or pale.     Findings: No bruising, erythema or lesion.  Neurological:     Mental Status: She is alert and oriented to person, place, and time.     Gait: Gait abnormal.  Psychiatric:        Mood and Affect: Mood normal.        Behavior: Behavior normal.        Thought Content: Thought content normal.        Judgment: Judgment normal.     No results found for any visits on 04/18/24.      Assessment & Plan:   Problem List Items Addressed This Visit       Cardiovascular and Mediastinum   HTN (hypertension) - Primary (Chronic)   Decrease amlodipine  to 5 mg daily due to hypotension, continue losartan  25 mg daily Monitor blood pressure at home keep a log and bring to next office visit in 2 weeks     04/18/2024    3:52 PM 04/18/2024    3:48 PM 02/08/2024    1:30 PM 01/14/2024    3:32 PM 01/14/2024    3:24 PM 09/29/2023    3:49 PM 06/29/2023    4:07 PM  BP/Weight  Systolic BP 84 85  71 77 105 90  Diastolic BP 62 59  42 50 77 60  Wt. (Lbs)  161 169  169 181.4   BMI  25.22 kg/m2 26.47 kg/m2  26.47 kg/m2 28.41 kg/m2              Relevant Medications   amLODipine  (NORVASC ) 5 MG tablet   Other Relevant Orders   CBC   CMP14+EGFR   Hypotension       04/18/2024    3:52 PM 04/18/2024    3:48 PM 02/08/2024    1:30 PM 01/14/2024    3:32 PM 01/14/2024    3:24 PM 09/29/2023    3:49 PM 06/29/2023    4:07 PM  BP/Weight  Systolic BP 84 85  71 77 105 90  Diastolic BP 62 59  42 50 77 60  Wt. (Lbs)  161 169  169 181.4   BMI   25.22 kg/m2 26.47 kg/m2  26.47 kg/m2 28.41 kg/m2    Orthostatic hypotension likely due to low blood pressure, exacerbated by amlodipine  and carvedilol . - Reduce amlodipine  to 5 mg daily continue losartan  25 mg  daily - Instruct to regularly monitor blood pressure at home.  Keep a log and bring to next office visit - Assist in obtaining a blood pressure monitor through Medicaid. - Ensure home environment safety to prevent falls.       Relevant Medications   amLODipine  (NORVASC ) 5 MG tablet     Other   Impaired mobility and ADLs   Impaired mobility and activities of daily living due to dizziness and weakness. Previous home health referral was unsuccessful. - Follow-up on referral for home health services to assist with daily activities. Uses a rolling walker for ambulation       Fall   Most likely due to hypotension Fall prevention education completed Decreased amlodipine  to 5 mg daily continue losartan  25 mg daily      Decreased appetite    Decreased appetite with occasional vomiting, possibly contributing to low blood pressure and dizziness. - Refer to gastroenterology for further evaluation.-Contact information provided - Encourage nutritional supplementation with Ensure.  Advised to drink at least 64 ounces of water daily to maintain hydration      Dizziness   Most likely due to hypotension Labs ordered but the lab staff was not able to get any of the labs drawn possibly due to dehydration.  Advised to drink at least 64 ounces of water daily ,patient advised to go to the emergency room if she does not feel better, follow-up in 2 weeks      Other Visit Diagnoses       Prediabetes       Relevant Orders   Hemoglobin A1c     Lipid screening       Relevant Orders   Lipid panel       Meds ordered this encounter  Medications   amLODipine  (NORVASC ) 5 MG tablet    Sig: Take 1 tablet (5 mg total) by mouth daily.    Dispense:  60 tablet    Refill:  1    Return in  about 2 weeks (around 05/02/2024) for HTN.  Genevieve Ritzel R Keilany Burnette, FNP

## 2024-04-18 NOTE — Assessment & Plan Note (Addendum)
     04/18/2024    3:52 PM 04/18/2024    3:48 PM 02/08/2024    1:30 PM 01/14/2024    3:32 PM 01/14/2024    3:24 PM 09/29/2023    3:49 PM 06/29/2023    4:07 PM  BP/Weight  Systolic BP 84 85  71 77 105 90  Diastolic BP 62 59  42 50 77 60  Wt. (Lbs)  161 169  169 181.4   BMI  25.22 kg/m2 26.47 kg/m2  26.47 kg/m2 28.41 kg/m2    Orthostatic hypotension likely due to low blood pressure, exacerbated by amlodipine  and carvedilol . - Reduce amlodipine  to 5 mg daily continue losartan  25 mg daily - Instruct to regularly monitor blood pressure at home.  Keep a log and bring to next office visit - Assist in obtaining a blood pressure monitor through Medicaid. - Ensure home environment safety to prevent falls.

## 2024-04-18 NOTE — Patient Instructions (Signed)
 [facility] [address] [place] [address] [phone]  Please start amlodipine  5 mg daily, continue your other current medications as ordered  Around 3 times per week, check your blood pressure 2 times per day. once in the morning and once in the evening. The readings should be at least one minute apart. Write down these values and bring them to your next nurse visit/appointment.  When you check your BP, make sure you have been doing something calm/relaxing 5 minutes prior to checking. Both feet should be flat on the floor and you should be sitting. Use your left arm and make sure it is in a relaxed position (on a table), and that the cuff is at the approximate level/height of your heart.   It is important that you exercise regularly at least 30 minutes 5 times a week as tolerated  Think about what you will eat, plan ahead. Choose  clean, green, fresh or frozen over canned, processed or packaged foods which are more sugary, salty and fatty. 70 to 75% of food eaten should be vegetables and fruit. Three meals at set times with snacks allowed between meals, but they must be fruit or vegetables. Aim to eat over a 12 hour period , example 7 am to 7 pm, and STOP after  your last meal of the day. Drink water,generally about 64 ounces per day, no other drink is as healthy. Fruit juice is best enjoyed in a healthy way, by EATING the fruit.  Thanks for choosing [facility] we consider it a privelige to serve you.  l

## 2024-04-18 NOTE — Assessment & Plan Note (Addendum)
 Decrease amlodipine  to 5 mg daily due to hypotension, continue losartan  25 mg daily Monitor blood pressure at home keep a log and bring to next office visit in 2 weeks     04/18/2024    3:52 PM 04/18/2024    3:48 PM 02/08/2024    1:30 PM 01/14/2024    3:32 PM 01/14/2024    3:24 PM 09/29/2023    3:49 PM 06/29/2023    4:07 PM  BP/Weight  Systolic BP 84 85  71 77 105 90  Diastolic BP 62 59  42 50 77 60  Wt. (Lbs)  161 169  169 181.4   BMI  25.22 kg/m2 26.47 kg/m2  26.47 kg/m2 28.41 kg/m2

## 2024-04-18 NOTE — Assessment & Plan Note (Signed)
  Decreased appetite with occasional vomiting, possibly contributing to low blood pressure and dizziness. - Refer to gastroenterology for further evaluation.-Contact information provided - Encourage nutritional supplementation with Ensure.  Advised to drink at least 64 ounces of water daily to maintain hydration

## 2024-04-18 NOTE — Assessment & Plan Note (Addendum)
 Impaired mobility and activities of daily living due to dizziness and weakness. Previous home health referral was unsuccessful. - Follow-up on referral for home health services to assist with daily activities. Uses a rolling walker for ambulation

## 2024-04-18 NOTE — Assessment & Plan Note (Signed)
 Most likely due to hypotension Fall prevention education completed Decreased amlodipine  to 5 mg daily continue losartan  25 mg daily

## 2024-04-18 NOTE — Assessment & Plan Note (Signed)
 Most likely due to hypotension Labs ordered but the lab staff was not able to get any of the labs drawn possibly due to dehydration.  Advised to drink at least 64 ounces of water daily ,patient advised to go to the emergency room if she does not feel better, follow-up in 2 weeks

## 2024-04-19 NOTE — Telephone Encounter (Signed)
 [facility] to see if they would be able to help with referral . Renella

## 2024-04-27 ENCOUNTER — Ambulatory Visit (INDEPENDENT_AMBULATORY_CARE_PROVIDER_SITE_OTHER): Payer: Self-pay

## 2024-04-27 DIAGNOSIS — Z0000 Encounter for general adult medical examination without abnormal findings: Secondary | ICD-10-CM | POA: Diagnosis not present

## 2024-04-27 DIAGNOSIS — Z1211 Encounter for screening for malignant neoplasm of colon: Secondary | ICD-10-CM

## 2024-04-27 NOTE — Patient Instructions (Signed)
 [name],  Thank you for taking the time for your Medicare Wellness Visit. I appreciate your continued commitment to your health goals. Please review the care plan we discussed, and feel free to reach out if I can assist you further.  Medicare recommends these wellness visits once per year to help you and your care team stay ahead of potential health issues. These visits are designed to focus on prevention, allowing your provider to concentrate on managing your acute and chronic conditions during your regular appointments.  Please note that Annual Wellness Visits do not include a physical exam. Some assessments may be limited, especially if the visit was conducted virtually. If needed, we may recommend a separate in-person follow-up with your provider.  Ongoing Care Seeing your primary care provider every 3 to 6 months helps us  monitor your health and provide consistent, personalized care.   Referrals If a referral was made during today's visit and you haven't received any updates within two weeks, please contact the referred provider directly to check on the status.  Recommended Screenings:  Health Maintenance  Topic Date Due   Breast Cancer Screening  Never done   Cologuard (Stool DNA test)  Never done   Zoster (Shingles) Vaccine (1 of 2) Never done   Pneumococcal Vaccine for age over 71 (2 of 2 - PCV) 08/04/2018   Flu Shot  Never done   COVID-19 Vaccine (4 - 2025-26 season) 03/20/2024   Pap with HPV screening  11/06/2024   Medicare Annual Wellness Visit  04/27/2025   DTaP/Tdap/Td vaccine (2 - Td or Tdap) 09/07/2027   Hepatitis C Screening  Completed   HIV Screening  Completed   Hepatitis B Vaccine  Aged Out   HPV Vaccine  Aged Out   Meningitis B Vaccine  Aged Out       04/27/2024    1:14 PM  Advanced Directives  Does Patient Have a Medical Advance Directive? No  Would patient like information on creating a medical advance directive? No - Patient declined   Advance Care  Planning is important because it: Ensures you receive medical care that aligns with your values, goals, and preferences. Provides guidance to your family and loved ones, reducing the emotional burden of decision-making during critical moments.  Vision: Annual vision screenings are recommended for early detection of glaucoma, cataracts, and diabetic retinopathy. These exams can also reveal signs of chronic conditions such as diabetes and high blood pressure.  Dental: Annual dental screenings help detect early signs of oral cancer, gum disease, and other conditions linked to overall health, including heart disease and diabetes.  Please see the attached documents for additional preventive care recommendations.

## 2024-04-27 NOTE — Progress Notes (Signed)
 Subjective:   Karen Ochoa is a 58 y.o. who presents for a Medicare Wellness preventive visit.  As a reminder, Annual Wellness Visits don't include a physical exam, and some assessments may be limited, especially if this visit is performed virtually. We may recommend an in-person follow-up visit with your provider if needed.  Visit Complete: Virtual I connected with  Laree D Endo on 04/27/24 by a audio enabled telemedicine application and verified that I am speaking with the correct person using two identifiers.  Patient Location: Home  Provider Location: Home Office  I discussed the limitations of evaluation and management by telemedicine. The patient expressed understanding and agreed to proceed.  Vital Signs: Because this visit was a virtual/telehealth visit, some criteria may be missing or patient reported. Any vitals not documented were not able to be obtained and vitals that have been documented are patient reported.  VideoError- Librarian, academic were attempted between this provider and patient, however failed, due to patient having technical difficulties OR patient did not have access to video capability.  We continued and completed visit with audio only.   Persons Participating in Visit: Patient.  AWV Questionnaire: No: Patient Medicare AWV questionnaire was not completed prior to this visit.  Cardiac Risk Factors include: dyslipidemia;hypertension     Objective:    Today's Vitals   There is no height or weight on file to calculate BMI.     04/27/2024    1:14 PM 04/09/2023    2:21 PM 04/09/2023    2:20 PM 07/15/2022   10:56 AM 12/26/2020    1:21 PM 10/06/2017    6:00 AM 10/05/2017    9:04 AM  Advanced Directives  Does Patient Have a Medical Advance Directive? No No No No No No  No   Would patient like information on creating a medical advance directive? No - Patient declined No - Patient declined No - Patient declined Yes  (MAU/Ambulatory/Procedural Areas - Information given) No - Patient declined No - Patient declined       Data saved with a previous flowsheet row definition    Current Medications (verified) Outpatient Encounter Medications as of 04/27/2024  Medication Sig   albuterol  (VENTOLIN  HFA) 108 (90 Base) MCG/ACT inhaler INHALE 2 PUFFS INTO THE LUNGS EVERY 6 HOURS AS NEEDED FOR WHEEZING OR SHORTNESS OF BREATH   amLODipine  (NORVASC ) 5 MG tablet Take 1 tablet (5 mg total) by mouth daily.   carvedilol  (COREG ) 25 MG tablet TAKE 1 TABLET BY MOUTH TWICE  DAILY WITH MEALS   famotidine  (PEPCID ) 20 MG tablet TAKE 1 TABLET BY MOUTH TWICE  DAILY   levocetirizine (XYZAL ) 5 MG tablet Take 1 tablet (5 mg total) by mouth every evening.   LINZESS  72 MCG capsule TAKE 1 CAPSULE BY MOUTH DAILY  BEFORE BREAKFAST   losartan  (COZAAR ) 25 MG tablet TAKE 1 TABLET BY MOUTH DAILY   ondansetron  (ZOFRAN ) 4 MG tablet Take 1 tablet (4 mg total) by mouth every 6 (six) hours.   rosuvastatin  (CRESTOR ) 10 MG tablet TAKE 1 TABLET BY MOUTH DAILY   traZODone  (DESYREL ) 150 MG tablet TAKE 1 TABLET BY MOUTH AT  BEDTIME AS NEEDED FOR SLEEP   aspirin  EC 81 MG tablet Take 81 mg by mouth daily. Swallow whole. (Patient not taking: Reported on 04/27/2024)   ibuprofen  (ADVIL ) 800 MG tablet Take 1 tablet (800 mg total) by mouth every 6 (six) hours as needed for headache. (Patient not taking: Reported on 04/27/2024)   No  facility-administered encounter medications on file as of 04/27/2024.    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   Fibroids    GERD (gastroesophageal reflux disease)    Headache    weekly (07/20/2017)   HLD (hyperlipidemia) 07/18/2017   Hx MRSA infection    Hypertension    Insomnia due to medical condition    Ischemic cardiomyopathy     Stroke (HCC) 07/18/2017   TIA (transient ischemic attack)    I've had several   Past Surgical History:  Procedure Laterality Date   CORONARY ARTERY BYPASS GRAFT N/A 10/07/2017   Procedure: CORONARY ARTERY BYPASS GRAFTING (CABG) ON PUMP TIMES 5 USING LEFT INTERNAL MAMMARY ARTERY AND BILATERAL GREATER SAPHENOUS VEIN VIA ENDOHARDEST.;  Surgeon: Lucas Dorise POUR, MD;  Location: [facility] OR;  Service: Open Heart Surgery;  Laterality: N/A;   DENTAL SURGERY  12/2022   RIGHT/LEFT HEART CATH AND CORONARY ANGIOGRAPHY N/A 10/05/2017   Procedure: RIGHT/LEFT HEART CATH AND CORONARY ANGIOGRAPHY;  Surgeon: Claudene Victory ORN, MD;  Location: [facility] INVASIVE [facility];  Service: Cardiovascular;  Laterality: N/A;   TEE WITHOUT CARDIOVERSION N/A 10/07/2017   Procedure: TRANSESOPHAGEAL ECHOCARDIOGRAM (TEE);  Surgeon: Lucas Dorise POUR, MD;  Location[facility] OR;  Service: Open Heart Surgery;  Laterality: N/A;   WISDOM TOOTH EXTRACTION     Family History  Problem Relation Age of Onset   Hypertension Mother    Diabetes Sister    Asthma Brother    Diabetes Brother    Anesthesia problems Neg Hx    Social History   Socioeconomic History   Marital status: Married    Spouse name: Not on file   Number of children: Not on file   Years of education: Not on file   Highest education level: Not on file  Occupational History   Not on file  Tobacco Use   Smoking status: Every Day    Current packs/day: 1.00    Average packs/day: 1 pack/day for 10.0 years (10.0 ttl pk-yrs)    Types: Cigarettes   Smokeless tobacco: Never   Tobacco comments:    2 to 3 per day  Vaping Use   Vaping status: Never Used  Substance and Sexual Activity   Alcohol use: No    Alcohol/week: 2.0 standard drinks of alcohol    Types: 2 Glasses of wine per week    Comment: 08/04/2017--states no    Drug use: No   Sexual activity: Yes    Birth control/protection: None  Other Topics Concern   Not on file  Social History Narrative   ** Merged History Encounter  **       Social Drivers of Health   Financial Resource Strain: Low Risk  (04/27/2024)   Overall Financial Resource Strain (CARDIA)    Difficulty of Paying Living Expenses: Not hard at all  Food Insecurity: No Food Insecurity (04/27/2024)   Hunger Vital Sign    Worried About Running Out of Food in the Last Year: Never true    Ran Out of Food in the Last Year: Never true  Transportation Needs: No Transportation Needs (04/27/2024)   PRAPARE - Administrator, Civil Service (Medical): No    Lack of Transportation (Non-Medical): No  Physical Activity: Inactive (04/27/2024)   Exercise Vital Sign    Days of Exercise per Week:  0 days    Minutes of Exercise per Session: 0 min  Stress: No Stress Concern Present (04/27/2024)   [employer] of Occupational Health - Occupational Stress Questionnaire    Feeling of Stress: Not at all  Social Connections: Moderately Isolated (04/27/2024)   Social Connection and Isolation Panel    Frequency of Communication with Friends and Family: More than three times a week    Frequency of Social Gatherings with Friends and Family: Not on file    Attends Religious Services: Never    Database administrator or Organizations: No    Attends Engineer, structural: Never    Marital Status: Married    Tobacco Counseling Ready to quit: Not Answered Counseling given: Not Answered Tobacco comments: 2 to 3 per day    Clinical Intake:  Pre-visit preparation completed: Yes  Pain : No/denies pain     Nutritional Risks: None Diabetes: No  Lab Results  Component Value Date   HGBA1C 5.6 02/18/2023   HGBA1C 5.8 (H) 10/06/2017   HGBA1C 5.9 (H) 07/19/2017     How often do you need to have someone help you when you read instructions, pamphlets, or other written materials from your doctor or pharmacy?: 1 - Never  Interpreter Needed?: No  Information entered by :: NAllen LPN   Activities of Daily Living     04/27/2024    1:08 PM  In  your present state of health, do you have any difficulty performing the following activities:  Hearing? 0  Vision? 0  Difficulty concentrating or making decisions? 0  Walking or climbing stairs? 1  Dressing or bathing? 1  Doing errands, shopping? 1  Comment has someone with her  Preparing Food and eating ? Y  Using the Toilet? N  In the past six months, have you accidently leaked urine? N  Do you have problems with loss of bowel control? N  Managing your Medications? N  Managing your Finances? N  Housekeeping or managing your Housekeeping? Y    Patient Care Team: Oley Bascom RAMAN, NP as PCP - General (Pulmonary Disease) Nahser, Aleene PARAS, MD (Inactive) as PCP - Cardiology (Cardiology)  I have updated your Care Teams any recent Medical Services you may have received from other providers in the past year.     Assessment:   This is a routine wellness examination for [place].  Hearing/Vision screen Hearing Screening - Comments:: Denies hearing issues Vision Screening - Comments:: Regular eye exams, [facility]   Goals Addressed             This Visit's Progress    Patient Stated       04/27/2024, wants to be independent       Depression Screen     04/27/2024    1:17 PM 01/14/2024    3:30 PM 04/09/2023    1:02 PM 10/02/2022    3:28 PM 07/15/2022   10:55 AM 11/05/2021    3:10 PM 08/05/2021   12:05 PM  PHQ 2/9 Scores  PHQ - 2 Score 0 3 6 2  0 0 0  PHQ- 9 Score 8 15 21 12        Fall Risk     04/27/2024    1:14 PM 07/15/2022   10:56 AM 11/05/2021    3:10 PM 08/05/2021   12:05 PM 04/01/2021   12:44 PM  Fall Risk   Falls in the past year? 1 0 0  0  Comment legs give out  Number falls in past yr: 1 0 0 0 0  Injury with Fall? 0 0 0 0 0  Risk for fall due to : Impaired mobility;Impaired balance/gait;Medication side effect No Fall Risks No Fall Risks    Follow up Falls evaluation completed;Falls prevention discussed Falls evaluation completed  Falls evaluation completed         Data saved with a previous flowsheet row definition    MEDICARE RISK AT HOME:  Medicare Risk at Home Any stairs in or around the home?: No If so, are there any without handrails?: No Home free of loose throw rugs in walkways, pet beds, electrical cords, etc?: Yes Adequate lighting in your home to reduce risk of falls?: Yes Life alert?: No Use of a cane, walker or w/c?: Yes Grab bars in the bathroom?: No Shower chair or bench in shower?: No Elevated toilet seat or a handicapped toilet?: No  TIMED UP AND GO:  Was the test performed?  No  Cognitive Function: 6CIT completed    12/26/2020    1:25 PM  MMSE - Mini Mental State Exam  Orientation to time 5  Orientation to Place 5  Registration 3  Attention/ Calculation 5  Recall 3  Language- name 2 objects 2  Language- repeat 1  Language- follow 3 step command 3  Language- read & follow direction 1  Write a sentence 1  Copy design 1  Total score 30        04/27/2024    1:20 PM 07/15/2022   10:57 AM  6CIT Screen  What Year? 0 points 0 points  What month? 0 points 0 points  What time? 0 points 0 points  Count back from 20 0 points 0 points  Months in reverse 0 points 0 points  Repeat phrase 0 points 0 points  Total Score 0 points 0 points    Immunizations Immunization History  Administered Date(s) Administered   PFIZER(Purple Top)SARS-COV-2 Vaccination 10/12/2019, 11/06/2019, 07/23/2020   Pneumococcal Polysaccharide-23 08/04/2017   Tdap 09/06/2017    Screening Tests Health Maintenance  Topic Date Due   Mammogram  Never done   Fecal DNA (Cologuard)  Never done   Zoster Vaccines- Shingrix (1 of 2) Never done   Pneumococcal Vaccine: 50+ Years (2 of 2 - PCV) 08/04/2018   Influenza Vaccine  Never done   COVID-19 Vaccine (4 - 2025-26 season) 03/20/2024   Cervical Cancer Screening (HPV/Pap Cotest)  11/06/2024   Medicare Annual Wellness (AWV)  04/27/2025   DTaP/Tdap/Td (2 - Td or Tdap) 09/07/2027   Hepatitis C  Screening  Completed   HIV Screening  Completed   Hepatitis B Vaccines 19-59 Average Risk  Aged Out   HPV VACCINES  Aged Out   Meningococcal B Vaccine  Aged Out    Health Maintenance Items Addressed: Vaccines Due: flu, pneumonia, covid, Declines cologuard but will do colonoscopy  Additional Screening:  Vision Screening: Recommended annual ophthalmology exams for early detection of glaucoma and other disorders of the eye. Is the patient up to date with their annual eye exam?  Yes  Who is the provider or what is the name of the office in which the patient attends annual eye exams? [facility]  Dental Screening: Recommended annual dental exams for proper oral hygiene  Community Resource Referral / Chronic Care Management: CRR required this visit?  No   CCM required this visit?  No   Plan:    I have personally reviewed and noted the following in the patient's chart:  Medical and social history Use of alcohol, tobacco or illicit drugs  Current medications and supplements including opioid prescriptions. Patient is not currently taking opioid prescriptions. Functional ability and status Nutritional status Physical activity Advanced directives List of other physicians Hospitalizations, surgeries, and ER visits in previous 12 months Vitals Screenings to include cognitive, depression, and falls Referrals and appointments  In addition, I have reviewed and discussed with patient certain preventive protocols, quality metrics, and best practice recommendations. A written personalized care plan for preventive services as well as general preventive health recommendations were provided to patient.   Ardella FORBES Dawn, LPN   [id number]   After Visit Summary: (Pick Up) Due to this being a telephonic visit, with patients personalized plan was offered to patient and patient has requested to Pick up at office.  Notes: Nothing significant to report at this time.

## 2024-05-02 ENCOUNTER — Other Ambulatory Visit: Payer: Self-pay

## 2024-05-02 DIAGNOSIS — I10 Essential (primary) hypertension: Secondary | ICD-10-CM

## 2024-05-02 MED ORDER — OMRON 3 SERIES BP MONITOR DEVI
1.0000 | 0 refills | Status: AC
Start: 2024-05-02 — End: ?

## 2024-05-03 ENCOUNTER — Ambulatory Visit: Payer: Self-pay | Admitting: Nurse Practitioner

## 2024-05-22 ENCOUNTER — Encounter: Payer: Self-pay | Admitting: Nurse Practitioner

## 2024-05-22 ENCOUNTER — Ambulatory Visit (INDEPENDENT_AMBULATORY_CARE_PROVIDER_SITE_OTHER): Payer: Self-pay | Admitting: Nurse Practitioner

## 2024-05-22 VITALS — BP 116/70 | HR 80 | Wt 166.2 lb

## 2024-05-22 DIAGNOSIS — R5381 Other malaise: Secondary | ICD-10-CM | POA: Diagnosis not present

## 2024-05-22 DIAGNOSIS — W19XXXD Unspecified fall, subsequent encounter: Secondary | ICD-10-CM

## 2024-05-22 NOTE — Progress Notes (Signed)
 Subjective   Patient ID: Karen Ochoa, female    DOB: 1966-07-09, 58 y.o.   MRN: [id number]  Chief Complaint  Patient presents with   Referral    PT and home health   Hypertension    Referring provider: Oley Bascom RAMAN, NP  Karen Ochoa is a 58 y.o. 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 Amlodipine  was decreased to 5 mg at last visit by Lorice - here for recheck today.  Vital signs are stable in office today.  Patient has been stable since her last visit here.  She does need a referral for home health physical therapy and a home health aide to help with activities of daily living at home.  She does need physical therapy due to recent falls and physical deconditioning. Denies f/c/s, n/v/d, hemoptysis, PND, leg swelling Denies chest pain or edema      Allergies  Allergen Reactions   Penicillins Anaphylaxis and Other (See Comments)    Has patient had a PCN reaction causing immediate rash, facial/tongue/throat swelling, SOB or lightheadedness with hypotension: Yes Has patient had a PCN reaction causing severe rash involving mucus membranes or skin necrosis: Yes Has patient had a PCN reaction that required hospitalization Yes Has patient had a PCN reaction occurring within the last 10 years: No If all of the above answers are NO, then may proceed with Cephalosporin use.    Sulfonamide Derivatives Anaphylaxis   Clonidine Derivatives Other (See Comments)    Patient passed out  shortly after ingesting this at a doctor's office on 05/08/16 (was taken in conjunction with a tablet of Labetalol.)   Flagyl  [Metronidazole ] Swelling and Other (See Comments)    Skin blisters   Labetalol Other (See Comments)    Patient passed out shortly after ingesting this at a doctor's office on 05/08/16 (was taken in conjunction with a tablet of Clonidine.)    Immunization History  Administered Date(s) Administered   PFIZER(Purple Top)SARS-COV-2 Vaccination 10/12/2019, 11/06/2019, 07/23/2020   Pneumococcal Polysaccharide-23 08/04/2017   Tdap 09/06/2017    Tobacco History: Social History   Tobacco Use  Smoking Status Every Day   Current packs/day: 1.00   Average packs/day: 1 pack/day for 10.0 years (10.0 ttl pk-yrs)   Types: Cigarettes  Smokeless Tobacco Never  Tobacco Comments   2 to 3 per day   Ready to quit: Not Answered Counseling given: Not Answered Tobacco comments: 2 to 3 per day   Outpatient Encounter Medications as of 05/22/2024  Medication Sig   albuterol  (VENTOLIN  HFA) 108 (90 Base) MCG/ACT inhaler INHALE 2 PUFFS INTO THE LUNGS EVERY 6 HOURS AS NEEDED FOR WHEEZING OR SHORTNESS OF BREATH   amLODipine  (NORVASC ) 5 MG tablet Take 1 tablet (5 mg total) by mouth daily.   aspirin  EC 81 MG tablet Take 81 mg by mouth daily. Swallow whole. (Patient not taking: Reported on 05/22/2024)   Blood Pressure Monitoring (OMRON 3 SERIES BP MONITOR) DEVI 1 each by Does not apply route as directed.  carvedilol  (COREG ) 25 MG tablet TAKE 1 TABLET BY MOUTH TWICE  DAILY WITH MEALS   famotidine  (PEPCID ) 20 MG tablet TAKE 1 TABLET BY MOUTH TWICE  DAILY   ibuprofen  (ADVIL ) 800 MG tablet Take 1 tablet (800 mg total) by mouth every 6 (six) hours as needed for headache. (Patient not taking: Reported on 04/27/2024)   levocetirizine (XYZAL ) 5 MG tablet Take 1 tablet (5 mg total) by mouth every evening.   LINZESS  72 MCG capsule TAKE 1 CAPSULE BY MOUTH DAILY  BEFORE BREAKFAST   losartan  (COZAAR )  25 MG tablet TAKE 1 TABLET BY MOUTH DAILY   ondansetron  (ZOFRAN ) 4 MG tablet Take 1 tablet (4 mg total) by mouth every 6 (six) hours.   rosuvastatin  (CRESTOR ) 10 MG tablet TAKE 1 TABLET BY MOUTH DAILY   traZODone  (DESYREL ) 150 MG tablet TAKE 1 TABLET BY MOUTH AT  BEDTIME AS NEEDED FOR SLEEP   No facility-administered encounter medications on file as of 05/22/2024.    Review of Systems  Review of Systems  Constitutional: Negative.   HENT: Negative.    Cardiovascular: Negative.   Gastrointestinal: Negative.   Allergic/Immunologic: Negative.   Neurological: Negative.   Psychiatric/Behavioral: Negative.       Objective:   BP 116/70 (BP Location: Left Arm, Patient Position: Sitting, Cuff Size: Normal)   Pulse 80   Wt 166 lb 3.2 oz (75.4 kg)   LMP 05/19/2013   SpO2 100%   BMI 26.03 kg/m   Wt Readings from Last 5 Encounters:  05/22/24 166 lb 3.2 oz (75.4 kg)  04/18/24 161 lb (73 kg)  02/08/24 169 lb (76.7 kg)  01/14/24 169 lb (76.7 kg)  09/29/23 181 lb 6.4 oz (82.3 kg)     Physical Exam Vitals and nursing note reviewed.  Constitutional:      General: She is not in acute distress.    Appearance: She is well-developed.  Cardiovascular:     Rate and Rhythm: Normal rate and regular rhythm.  Pulmonary:     Effort: Pulmonary effort is normal.     Breath sounds: Normal breath sounds.  Neurological:     Mental Status: She is alert and oriented to person, place, and time.       Assessment & Plan:   Physical deconditioning -     Ambulatory referral to Home Health  Fall, subsequent encounter -     Ambulatory referral to Home Health     Return in about 3 months (around 08/22/2024).   Bascom GORMAN Borer, NP 05/22/2024

## 2024-06-18 ENCOUNTER — Other Ambulatory Visit: Payer: Self-pay | Admitting: Nurse Practitioner

## 2024-06-18 DIAGNOSIS — K219 Gastro-esophageal reflux disease without esophagitis: Secondary | ICD-10-CM

## 2024-06-20 ENCOUNTER — Emergency Department (HOSPITAL_COMMUNITY)

## 2024-06-20 ENCOUNTER — Emergency Department (HOSPITAL_COMMUNITY): Admission: EM | Admit: 2024-06-20 | Discharge: 2024-06-20 | Disposition: A | Discharge status: Home / Self Care

## 2024-06-20 ENCOUNTER — Other Ambulatory Visit: Payer: Self-pay

## 2024-06-20 ENCOUNTER — Encounter (HOSPITAL_COMMUNITY): Payer: Self-pay

## 2024-06-20 DIAGNOSIS — F10920 Alcohol use, unspecified with intoxication, uncomplicated: Secondary | ICD-10-CM

## 2024-06-20 DIAGNOSIS — W19XXXA Unspecified fall, initial encounter: Secondary | ICD-10-CM

## 2024-06-20 DIAGNOSIS — F10129 Alcohol abuse with intoxication, unspecified: Secondary | ICD-10-CM | POA: Insufficient documentation

## 2024-06-20 DIAGNOSIS — I6782 Cerebral ischemia: Secondary | ICD-10-CM | POA: Insufficient documentation

## 2024-06-20 DIAGNOSIS — I10 Essential (primary) hypertension: Secondary | ICD-10-CM | POA: Insufficient documentation

## 2024-06-20 DIAGNOSIS — Y906 Blood alcohol level of 120-199 mg/100 ml: Secondary | ICD-10-CM | POA: Insufficient documentation

## 2024-06-20 DIAGNOSIS — Z79899 Other long term (current) drug therapy: Secondary | ICD-10-CM | POA: Insufficient documentation

## 2024-06-20 DIAGNOSIS — W01198A Fall on same level from slipping, tripping and stumbling with subsequent striking against other object, initial encounter: Secondary | ICD-10-CM | POA: Insufficient documentation

## 2024-06-20 DIAGNOSIS — Y92002 Bathroom of unspecified non-institutional (private) residence single-family (private) house as the place of occurrence of the external cause: Secondary | ICD-10-CM | POA: Insufficient documentation

## 2024-06-20 DIAGNOSIS — R220 Localized swelling, mass and lump, head: Secondary | ICD-10-CM | POA: Insufficient documentation

## 2024-06-20 DIAGNOSIS — S0990XA Unspecified injury of head, initial encounter: Secondary | ICD-10-CM | POA: Insufficient documentation

## 2024-06-20 LAB — CBC
HCT: 42.4 % (ref 36.0–46.0)
Hemoglobin: 14 g/dL (ref 12.0–15.0)
MCH: 32.3 pg (ref 26.0–34.0)
MCHC: 33 g/dL (ref 30.0–36.0)
MCV: 97.7 fL (ref 80.0–100.0)
Platelets: 162 K/uL (ref 150–400)
RBC: 4.34 MIL/uL (ref 3.87–5.11)
RDW: 13.5 % (ref 11.5–15.5)
WBC: 9.2 K/uL (ref 4.0–10.5)
nRBC: 0 % (ref 0.0–0.2)

## 2024-06-20 LAB — COMPREHENSIVE METABOLIC PANEL WITH GFR
ALT: 23 U/L (ref 0–44)
AST: 64 U/L — ABNORMAL HIGH (ref 15–41)
Albumin: 3.4 g/dL — ABNORMAL LOW (ref 3.5–5.0)
Alkaline Phosphatase: 94 U/L (ref 38–126)
Anion gap: 13 (ref 5–15)
BUN: 12 mg/dL (ref 6–20)
CO2: 27 mmol/L (ref 22–32)
Calcium: 8.8 mg/dL — ABNORMAL LOW (ref 8.9–10.3)
Chloride: 97 mmol/L — ABNORMAL LOW (ref 98–111)
Creatinine, Ser: 1.75 mg/dL — ABNORMAL HIGH (ref 0.44–1.00)
GFR, Estimated: 33 mL/min — ABNORMAL LOW (ref >60)
Glucose, Bld: 88 mg/dL (ref 70–99)
Potassium: 3.2 mmol/L — ABNORMAL LOW (ref 3.5–5.1)
Sodium: 137 mmol/L (ref 135–145)
Total Bilirubin: 0.5 mg/dL (ref 0.0–1.2)
Total Protein: 6.4 g/dL — ABNORMAL LOW (ref 6.5–8.1)

## 2024-06-20 LAB — ETHANOL: Alcohol, Ethyl (B): 148 mg/dL — ABNORMAL HIGH (ref <15)

## 2024-06-20 MED ORDER — POTASSIUM CHLORIDE CRYS ER 20 MEQ PO TBCR
40.0000 meq | EXTENDED_RELEASE_TABLET | Freq: Once | ORAL | Status: AC
  Administered 2024-06-20: 40 meq via ORAL
  Filled 2024-06-20: qty 2

## 2024-06-20 NOTE — ED Provider Triage Note (Signed)
 Emergency Medicine Provider Triage Evaluation Note  Karen Ochoa , a 58 y.o. female  was evaluated in triage.  Pt complains of follow-up.  Patient states that she tripped.  Endorses drinking alcohol today.  Did not lose consciousness..  Review of Systems  Positive: Elbow pain Negative: Cp,sob  Physical Exam  BP 115/87   Pulse 65   Temp 97.6 F (36.4 C)   Resp 16   Ht 5' 7 (1.702 m)   Wt 72.6 kg   LMP 05/19/2013   SpO2 100%   BMI 25.06 kg/m  Gen:   Awake, no distress   Resp:  Normal effort  MSK:   Moves extremities without difficulty Other:     Medical Decision Making  Medically screening exam initiated at 1:36 PM.  Appropriate orders placed.  Karen Ochoa was informed that the remainder of the evaluation will be completed by another provider, this initial triage assessment does not replace that evaluation, and the importance of remaining in the ED until their evaluation is complete.  Getting labs ordered.  X-rays of the elbows ordered.  CT C-spine and head ordered.  ANO x 3 .   Karen Lavonia SAILOR, MD 06/20/24 1346

## 2024-06-20 NOTE — ED Notes (Signed)
 Pt D/C home per EDP order. Discharge summary reviewed with pt, pt verbalizes understanding. NAD, Reports discharge ride home.

## 2024-06-20 NOTE — ED Provider Notes (Signed)
 [facility] EMERGENCY DEPARTMENT AT [facility] Provider Note   CSN: [id number] Arrival date & time: 06/20/24  1303     Patient presents with: Fall and Alcohol Intoxication   Karen Ochoa is a 58 y.o. female.  {Add pertinent medical, surgical, social history, OB history to HPI:3393} 58 year old female with past medical history of alcohol abuse with past medical history of hypertension hyperlipidemia presenting to the emergency department today with bilateral elbow pain after a mechanical fall at home.  The patient states she had been drinking this morning and tripped and fell.  She reports that she does drink daily.  She is unsure if she hit her head or lost consciousness.  She came to the ER today for further evaluation.  She denies any significant headache or neck pain.  She was seen in triage and had CT scan of her head and cervical spine ordered.  Also reports that she did have pain in her bilateral elbows but is now feeling better.  She denies any nausea or vomiting.  Denies any other injuries.   Fall  Alcohol Intoxication       Prior to Admission medications   Medication Sig Start Date End Date Taking? Authorizing Provider  albuterol  (VENTOLIN  HFA) 108 (90 Base) MCG/ACT inhaler INHALE 2 PUFFS INTO THE LUNGS EVERY 6 HOURS AS NEEDED FOR WHEEZING OR SHORTNESS OF BREATH 02/18/23   Oley Bascom RAMAN, NP  amLODipine  (NORVASC ) 5 MG tablet Take 1 tablet (5 mg total) by mouth daily. 04/18/24   Paseda, Folashade R, FNP  aspirin  EC 81 MG tablet Take 81 mg by mouth daily. Swallow whole. Patient not taking: Reported on 05/22/2024    [provider]  Blood Pressure Monitoring (OMRON 3 SERIES BP MONITOR) DEVI 1 each by Does not apply route as directed. 05/02/24   Paseda, Folashade R, FNP  carvedilol  (COREG ) 25 MG tablet TAKE 1 TABLET BY MOUTH TWICE  DAILY WITH MEALS 10/28/23   Nichols, Tonya S, NP  famotidine  (PEPCID ) 20 MG tablet TAKE 1 TABLET BY MOUTH TWICE  DAILY 06/19/24    Nichols, Tonya S, NP  ibuprofen  (ADVIL ) 800 MG tablet Take 1 tablet (800 mg total) by mouth every 6 (six) hours as needed for headache. Patient not taking: Reported on 04/27/2024 02/18/23   Nichols, Tonya S, NP  levocetirizine (XYZAL ) 5 MG tablet Take 1 tablet (5 mg total) by mouth every evening. 02/08/24   Paseda, Folashade R, FNP  LINZESS  72 MCG capsule TAKE 1 CAPSULE BY MOUTH DAILY  BEFORE BREAKFAST 12/23/23   Nichols, Tonya S, NP  losartan  (COZAAR ) 25 MG tablet TAKE 1 TABLET BY MOUTH DAILY 01/12/24   Nichols, Tonya S, NP  ondansetron  (ZOFRAN ) 4 MG tablet Take 1 tablet (4 mg total) by mouth every 6 (six) hours. 04/18/24   Paseda, Folashade R, FNP  rosuvastatin  (CRESTOR ) 10 MG tablet TAKE 1 TABLET BY MOUTH DAILY 06/19/24   Nichols, Tonya S, NP  traZODone  (DESYREL ) 150 MG tablet TAKE 1 TABLET BY MOUTH AT  BEDTIME AS NEEDED FOR SLEEP 12/23/23   Nichols, Tonya S, NP    Allergies: Penicillins, Sulfonamide derivatives, Clonidine derivatives, Flagyl  [metronidazole ], and Labetalol    Review of Systems  Musculoskeletal:  Positive for arthralgias.  All other systems reviewed and are negative.   Updated Vital Signs BP 115/87   Pulse 65   Temp 97.6 F (36.4 C)   Resp 16   Ht 5' 7 (1.702 m)   Wt 72.6 kg   LMP  05/19/2013   SpO2 100%   BMI 25.06 kg/m   Physical Exam Vitals and nursing note reviewed.   Gen: NAD, speaking in full sentences, no slurred speech noted Eyes: PERRL, EOMI HEENT: no oropharyngeal swelling Neck: trachea midline, no midline tenderness Resp: clear to auscultation bilaterally Card: RRR, no murmurs, rubs, or gallops Abd: nontender, nondistended Extremities: no calf tenderness, no edema, the patient is reporting tenderness over the bilateral elbows with no deformities or swelling noted Vascular: 2+ radial pulses bilaterally, 2+ DP pulses bilaterally Neuro: No focal deficits, no slurred speech noted, no ataxia noted Skin: no rashes Psyc: acting appropriately   (all labs  ordered are listed, but only abnormal results are displayed) Labs Reviewed  COMPREHENSIVE METABOLIC PANEL WITH GFR - Abnormal; Notable for the following components:      Result Value   Potassium 3.2 (*)    Chloride 97 (*)    Creatinine, Ser 1.75 (*)    Calcium  8.8 (*)    Total Protein 6.4 (*)    Albumin  3.4 (*)    AST 64 (*)    GFR, Estimated 33 (*)    All other components within normal limits  ETHANOL - Abnormal; Notable for the following components:   Alcohol, Ethyl (B) 148 (*)    All other components within normal limits  CBC  RAPID URINE DRUG SCREEN, HOSP PERFORMED    EKG: None  Radiology: CT Cervical Spine Wo Contrast Result Date: 06/20/2024 CLINICAL DATA:  poly trauma Status post fall. EXAM: CT CERVICAL SPINE WITHOUT CONTRAST TECHNIQUE: Multidetector CT imaging of the cervical spine was performed without intravenous contrast. Multiplanar CT image reconstructions were also generated. RADIATION DOSE REDUCTION: This exam was performed according to the departmental dose-optimization program which includes automated exposure control, adjustment of the mA and/or kV according to patient size and/or use of iterative reconstruction technique. COMPARISON:  None Available. FINDINGS: Alignment: Reversal of the usual cervical lordosis without focal angulation. Minimal degenerative anterolisthesis at C4-5. Skull base and vertebrae: No evidence of acute cervical spine fracture or traumatic subluxation. Soft tissues and spinal canal: No prevertebral fluid or swelling. No visible canal hematoma. Disc levels: Chronic spondylosis with disc space narrowing and uncinate spurring at C5-6 and C6-7. No large disc herniation or high-grade spinal stenosis. There is mild foraminal narrowing bilaterally at C5-6 and C6-7. Upper chest: Clear lung apices. Other: Severe bilateral carotid atherosclerosis. IMPRESSION: 1. No evidence of acute cervical spine fracture, traumatic subluxation or static signs of instability.  2. Chronic spondylosis at C5-6 and C6-7. 3. Severe bilateral carotid atherosclerosis. Electronically Signed   By: Elsie Perone M.D.   On: 06/20/2024 14:34   CT Head Wo Contrast Result Date: 06/20/2024 EXAM: CT HEAD WITHOUT CONTRAST 06/20/2024 02:04:37 PM TECHNIQUE: CT of the head was performed without the administration of intravenous contrast. Automated exposure control, iterative reconstruction, and/or weight based adjustment of the mA/kV was utilized to reduce the radiation dose to as low as reasonably achievable. COMPARISON: Comparison from 07/18/2017. CLINICAL HISTORY: poly trauma FINDINGS: BRAIN AND VENTRICLES: No acute hemorrhage. No evidence of acute infarct. No hydrocephalus. No extra-axial collection. No mass effect or midline shift. There is atrophy and chronic small vessel disease throughout the deep white matter. ORBITS: No acute abnormality. SINUSES: No acute abnormality. SOFT TISSUES AND SKULL: Soft tissue swelling in the forehead. No skull fracture. IMPRESSION: 1. No acute intracranial abnormality. 2. Soft tissue swelling in the forehead. 3. Atrophy and chronic small vessel disease throughout the deep white matter. Electronically signed by:  Franky Crease MD 06/20/2024 02:29 PM EST RP Workstation: [id number]   DG Elbow Complete Right Result Date: 06/20/2024 EXAM: 3 VIEW(S) XRAY OF THE RIGHT ELBOW COMPARISON: None available. CLINICAL HISTORY: Fall FINDINGS: BONES AND JOINTS: No acute fracture. No focal osseous lesion. No joint dislocation. No joint effusion. SOFT TISSUES: The soft tissues are unremarkable. IMPRESSION: 1. No acute abnormality. Electronically signed by: Lynwood Seip MD 06/20/2024 02:01 PM EST RP Workstation: [id number]   DG Elbow Complete Left Result Date: 06/20/2024 EXAM: 3 VIEW(S) XRAY OF THE LEFT ELBOW COMPARISON: None available. CLINICAL HISTORY: Fall FINDINGS: BONES AND JOINTS: No acute fracture. No focal osseous lesion. No joint dislocation. No joint effusion. SOFT  TISSUES: The soft tissues are unremarkable. IMPRESSION: 1. No acute abnormality. Electronically signed by: Lynwood Seip MD 06/20/2024 01:59 PM EST RP Workstation: [id number]    {Document cardiac monitor, telemetry assessment procedure when appropriate:32947} Procedures   Medications Ordered in the ED - No data to display    {Click here for ABCD2, HEART and other calculators REFRESH Note before signing:1}                              Medical Decision Making 58 year old female with past medical history of alcohol abuse daily the as well as hypertension and hyperlipidemia presenting to the emergency department today after a mechanical fall at home.  The patient's labs from triage do show an AKI and her potassium is low here.  Her CT scan of her head and cervical spine are negative.  X-rays do not show any acute findings.  The patient is requesting discharge.  She has been observed here in the emergency department for a few hours.  She is not clinically intoxicated at this time.  Blood alcohol level when she first arrived was mildly elevated but at this point she does seem clinically sober.  I discussed the patient's AKI and discussed keeping her here in the emergency department for IV fluids versus getting her admitted to ensure that her labs improved.  She does not want to stay in the emergency department.  She is on the phone with her husband and does have a sober ride home.  She does have decision-making capacity and does not want to stay in the emergency department any further for any further treatment.  She is ultimately discharged through shared decision making.     {Document critical care time when appropriate  Document review of labs and clinical decision tools ie CHADS2VASC2, etc  Document your independent review of radiology images and any outside records  Document your discussion with family members, caretakers and with consultants  Document social determinants of health affecting pt's  care  Document your decision making why or why not admission, treatments were needed:32947:::1}   Final diagnoses:  None    ED Discharge Orders     None

## 2024-06-20 NOTE — Discharge Instructions (Signed)
 Your labs today did show some dehydration.  We discussed keeping you here in the emergency department for IV fluids and possibly longer for further fluids.  You do not want stay here in the emergency department.  Please drink plenty fluids at home and follow-up with your doctor to have your labs rechecked.  Return to the ER for worsening symptoms.

## 2024-06-20 NOTE — ED Triage Notes (Signed)
 Patient Karen Ochoa [facility] from home after having multiple falls this morning. Her first fall was at 4am. She fell face first in the bathroom and hit her head. The 2nd fall was when she was trying to get in the bed. EMS reports she has been drinking mikes hard lemonades since midnight and she states the drinks alcohol daily and would like help with her alcohol abuse.  EMS reports that she has a hematoma to the left eye, back pain, bruising to both elbows. Patient denies LOC and does not take thinners.

## 2024-07-06 ENCOUNTER — Telehealth: Payer: Self-pay

## 2024-07-06 NOTE — Telephone Encounter (Signed)
 Patients husband, Andreea Arca dropped off FMLA paperwork for him to help take care of his wife.  Forms placed in providers file slot at the front desk.  Patients husband request is to have paperwork filled out by 07/14/24, when [place] returns to the office for an office visit.

## 2024-07-07 NOTE — Telephone Encounter (Signed)
FYI Raymond

## 2024-07-14 ENCOUNTER — Ambulatory Visit: Payer: Self-pay | Admitting: Nurse Practitioner

## 2024-07-16 ENCOUNTER — Other Ambulatory Visit: Payer: Self-pay | Admitting: Nurse Practitioner

## 2024-07-16 DIAGNOSIS — I25118 Atherosclerotic heart disease of native coronary artery with other forms of angina pectoris: Secondary | ICD-10-CM

## 2024-07-16 DIAGNOSIS — I10 Essential (primary) hypertension: Secondary | ICD-10-CM

## 2024-07-17 NOTE — Telephone Encounter (Signed)
 Forms on provider desk.  kh

## 2024-08-03 ENCOUNTER — Telehealth: Payer: Self-pay

## 2024-08-03 NOTE — Telephone Encounter (Signed)
 Copied from CRM [id number]. Topic: General - Other >> Aug 02, 2024 11:12 AM Lonell PEDLAR wrote: Reason for CRM: Patient is called, requesting an update on FMLA forms that her husband dropped off. Please call [phone].  Can fax forms to [phone]  Called pt back to advise of the need of an appointment. KH

## 2024-08-11 ENCOUNTER — Ambulatory Visit: Payer: Self-pay | Admitting: Nurse Practitioner

## 2024-08-11 ENCOUNTER — Encounter: Payer: Self-pay | Admitting: Nurse Practitioner

## 2024-08-11 ENCOUNTER — Ambulatory Visit (INDEPENDENT_AMBULATORY_CARE_PROVIDER_SITE_OTHER): Admitting: Nurse Practitioner

## 2024-08-11 VITALS — BP 87/61 | HR 71 | Temp 97.6°F | Wt 157.0 lb

## 2024-08-11 DIAGNOSIS — Z7409 Other reduced mobility: Secondary | ICD-10-CM

## 2024-08-11 DIAGNOSIS — J302 Other seasonal allergic rhinitis: Secondary | ICD-10-CM | POA: Diagnosis not present

## 2024-08-11 DIAGNOSIS — I10 Essential (primary) hypertension: Secondary | ICD-10-CM

## 2024-08-11 DIAGNOSIS — I959 Hypotension, unspecified: Secondary | ICD-10-CM

## 2024-08-11 DIAGNOSIS — Z8673 Personal history of transient ischemic attack (TIA), and cerebral infarction without residual deficits: Secondary | ICD-10-CM | POA: Diagnosis not present

## 2024-08-11 DIAGNOSIS — I25118 Atherosclerotic heart disease of native coronary artery with other forms of angina pectoris: Secondary | ICD-10-CM | POA: Diagnosis not present

## 2024-08-11 MED ORDER — CARVEDILOL 25 MG PO TABS: 25.0000 mg | ORAL_TABLET | Freq: Every day | ORAL | 2 refills | Status: AC

## 2024-08-11 MED ORDER — LEVOCETIRIZINE DIHYDROCHLORIDE 5 MG PO TABS: 5.0000 mg | ORAL_TABLET | Freq: Every evening | ORAL | 2 refills | Status: AC

## 2024-08-11 NOTE — Progress Notes (Signed)
 "  Subjective   Patient ID: Karen Ochoa, female    DOB: [birth year], 59 y.o.   MRN: [id number]  Chief Complaint  Patient presents with   Follow-up    3 month f/u and discuss FMLA.     Referring provider: Oley Karen RAMAN, NP  Karen Ochoa is a 59 y.o. 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  He will need this for his job to be able to get off to bring her to and from appointments.  Patient also requesting referral to physical therapy for impaired mobility after stroke.  We will place referral today.  We discussed that patient does need to follow-up with neurology as well as she does need to call make an appointment with them.  Blood pressure was low in the office today.  Patient is on multiple blood pressure medications.  We discussed that she can hold Coreg  in the afternoons for now.  We will have her return in 1 week to follow-up on hypotension.  A referral has been placed to cardiology.  Denies f/c/s, n/v/d, hemoptysis, PND, leg swelling Denies chest pain or edema   Allergies[1]  Immunization History  Administered Date(s) Administered   PFIZER(Purple Top)SARS-COV-2 Vaccination 10/12/2019, 11/06/2019, 07/23/2020   Pneumococcal Polysaccharide-23 08/04/2017   Tdap 09/06/2017    Tobacco History: Tobacco Use History[2] Ready to quit: Not Answered Counseling given: Not Answered Tobacco comments: 2 to 3 per day   Outpatient Encounter Medications as  of 08/11/2024  Medication Sig   albuterol  (VENTOLIN  HFA) 108 (90 Base) MCG/ACT inhaler INHALE 2 PUFFS INTO THE LUNGS EVERY 6 HOURS AS NEEDED FOR WHEEZING OR SHORTNESS OF BREATH   amLODipine  (NORVASC ) 5 MG tablet Take 1 tablet (5 mg total) by mouth daily.   aspirin  EC 81 MG tablet Take 81 mg by mouth daily. Swallow whole.   famotidine  (PEPCID ) 20 MG tablet TAKE 1 TABLET BY MOUTH TWICE  DAILY   LINZESS  72 MCG capsule TAKE 1 CAPSULE BY MOUTH DAILY  BEFORE BREAKFAST   losartan  (COZAAR ) 25 MG tablet TAKE 1 TABLET BY MOUTH DAILY   ondansetron  (ZOFRAN ) 4 MG tablet Take 1 tablet (4 mg total) by mouth every 6 (six) hours.   rosuvastatin  (CRESTOR ) 10 MG tablet TAKE 1 TABLET BY MOUTH DAILY   traZODone  (DESYREL ) 150 MG tablet TAKE 1 TABLET BY MOUTH AT  BEDTIME AS NEEDED FOR SLEEP   [DISCONTINUED] carvedilol  (COREG ) 25 MG tablet TAKE 1 TABLET BY MOUTH TWICE  DAILY WITH MEALS   Blood Pressure Monitoring (OMRON 3 SERIES BP MONITOR) DEVI 1 each by Does not apply route as directed. (Patient not taking: Reported on 08/11/2024)   carvedilol  (COREG ) 25 MG tablet Take 1 tablet (25 mg total) by mouth daily.   ibuprofen  (ADVIL ) 800 MG tablet Take 1 tablet (800 mg total) by mouth every 6 (six) hours as needed for headache. (Patient not taking: Reported on 08/11/2024)   levocetirizine (XYZAL ) 5 MG tablet Take 1  tablet (5 mg total) by mouth every evening.   [DISCONTINUED] levocetirizine (XYZAL ) 5 MG tablet Take 1 tablet (5 mg total) by mouth every evening. (Patient not taking: Reported on 08/11/2024)   No facility-administered encounter medications on file as of 08/11/2024.    Review of Systems  Review of Systems  Constitutional: Negative.   HENT: Negative.    Cardiovascular: Negative.   Gastrointestinal: Negative.   Allergic/Immunologic: Negative.   Neurological: Negative.   Psychiatric/Behavioral: Negative.       Objective:   BP (!) 87/61   Pulse 71   Temp 97.6 F (36.4 C) (Temporal)   Wt 157 lb (71.2 kg)    LMP 05/19/2013   SpO2 99%   BMI 24.59 kg/m   Wt Readings from Last 5 Encounters:  08/11/24 157 lb (71.2 kg)  06/20/24 160 lb (72.6 kg)  05/22/24 166 lb 3.2 oz (75.4 kg)  04/18/24 161 lb (73 kg)  02/08/24 169 lb (76.7 kg)     Physical Exam Vitals and nursing note reviewed.  Constitutional:      General: She is not in acute distress.    Appearance: She is well-developed.  Cardiovascular:     Rate and Rhythm: Normal rate and regular rhythm.  Pulmonary:     Effort: Pulmonary effort is normal.     Breath sounds: Normal breath sounds.  Neurological:     Mental Status: She is alert and oriented to person, place, and time.       Assessment & Plan:   History of CVA (cerebrovascular accident) -     Ambulatory referral to Physical Therapy  Seasonal allergies -     Levocetirizine Dihydrochloride ; Take 1 tablet (5 mg total) by mouth every evening.  Dispense: 90 tablet; Refill: 2  Impaired mobility -     Ambulatory referral to Physical Therapy  Hypotension, unspecified hypotension type -     Ambulatory referral to Cardiology -     AMB Referral [facility] Care Management  Essential hypertension -     Carvedilol ; Take 1 tablet (25 mg total) by mouth daily.  Dispense: 90 tablet; Refill: 2  Coronary artery disease of native artery of native heart with stable angina pectoris -     Carvedilol ; Take 1 tablet (25 mg total) by mouth daily.  Dispense: 90 tablet; Refill: 2     Return in about 1 week (around 08/18/2024) for hypotension.   Karen GORMAN Borer, NP 08/11/2024     [1]  Allergies Allergen Reactions   Penicillins Anaphylaxis and Other (See Comments)    Has patient had a PCN reaction causing immediate rash, facial/tongue/throat swelling, SOB or lightheadedness with hypotension: Yes Has patient had a PCN reaction causing severe rash involving mucus membranes or skin necrosis: Yes Has patient had a PCN reaction that required hospitalization Yes Has patient had a PCN reaction  occurring within the last 10 years: No If all of the above answers are NO, then may proceed with Cephalosporin use.    Sulfonamide Derivatives Anaphylaxis   Clonidine And Derivatives Other (See Comments)    Patient passed out shortly after ingesting this at a doctor's office on 05/08/16 (was taken in conjunction with a tablet of Labetalol.)   Flagyl  [Metronidazole ] Swelling and Other (See Comments)    Skin blisters   Labetalol Other (See Comments)    Patient passed out shortly after ingesting this at a doctor's office on 05/08/16 (was taken in conjunction with a tablet of Clonidine.)  [2]  Social History Tobacco Use  Smoking Status Every Day   Current packs/day: 1.00   Average packs/day: 1 pack/day for 10.0 years (10.0 ttl pk-yrs)   Types: Cigarettes  Smokeless Tobacco Never  Tobacco Comments   2 to 3 per day   "

## 2024-08-14 ENCOUNTER — Telehealth: Payer: Self-pay

## 2024-08-14 DIAGNOSIS — Z7409 Other reduced mobility: Secondary | ICD-10-CM

## 2024-08-14 NOTE — Telephone Encounter (Signed)
 Copied from CRM #[id number]. Topic: Clinical - Home Health Verbal Orders >> Aug 14, 2024 10:40 AM Rosaria BRAVO wrote: Pt called reporting that she needs her PT in the home.  Please Advise.  CB.

## 2024-08-15 ENCOUNTER — Other Ambulatory Visit: Payer: Self-pay | Admitting: Nurse Practitioner

## 2024-08-15 DIAGNOSIS — I25118 Atherosclerotic heart disease of native coronary artery with other forms of angina pectoris: Secondary | ICD-10-CM

## 2024-08-15 DIAGNOSIS — I10 Essential (primary) hypertension: Secondary | ICD-10-CM

## 2024-08-17 NOTE — Telephone Encounter (Signed)
 Referral has been placed as requested. CB.

## 2024-08-17 NOTE — Addendum Note (Signed)
 Addended by: COLE GEROGE SQUIBB on: 08/17/2024 11:57 AM   Modules accepted: Orders

## 2024-08-18 ENCOUNTER — Telehealth: Payer: Self-pay | Admitting: *Deleted

## 2024-08-18 NOTE — Progress Notes (Signed)
 Care Guide Pharmacy Note  08/18/2024 Name: Karen Ochoa MRN: [id number] DOB: 09/06/1965  Referred By: Oley Bascom RAMAN, NP Reason for referral: Complex Care Management (Initial outreach to schedule referral with Pharmacy )   Karen Ochoa is a 59 y.o. year old female who is a primary care patient of Oley Bascom RAMAN, NP.  Karen Ochoa was referred to the pharmacist for assistance related to: hypotension   Successful contact was made with the patient to discuss pharmacy services including being ready for the pharmacist to call at least 5 minutes before the scheduled appointment time and to have medication bottles and any blood pressure readings ready for review. The patient agreed to meet with the pharmacist via in office 3/25 at 300PM  on (date/time).  Harlene Satterfield  [facility], [facility] Guide  Direct Dial: [phone]  Fax [phone]

## 2024-08-22 ENCOUNTER — Telehealth: Payer: Self-pay

## 2024-08-22 NOTE — Telephone Encounter (Signed)
 Copied from CRM #[id number]. Topic: Clinical - Home Health Verbal Orders >> Aug 22, 2024  3:25 PM Tobias CROME wrote: Caller/Agency: [facility] Callback Number: [phone] Service Requested: Physical Therapy Frequency: 2x4 1x2 Any new concerns about the patient? No  Please advise on verbal orders. CB.

## 2024-08-23 ENCOUNTER — Ambulatory Visit: Payer: Self-pay | Admitting: Nurse Practitioner

## 2024-08-23 ENCOUNTER — Telehealth: Payer: Self-pay

## 2024-08-23 ENCOUNTER — Encounter: Payer: Self-pay | Admitting: Nurse Practitioner

## 2024-08-23 NOTE — Telephone Encounter (Signed)
 Left message giving verbal orders. CB.

## 2024-08-23 NOTE — Telephone Encounter (Signed)
 Copied from CRM [id number]. Topic: Clinical - Home Health Verbal Orders >> Aug 23, 2024  9:06 AM Treva T wrote: Caller/Agency: [facility] Callback Number: [phone] Service Requested: Physical Therapy  Received call from above, requesting status update of orders requested on 08/22/24.  Requesting a call back to advise.  Caller aware of same day call back.  Called and spoke with [facility] giving verbal orders. No further action is needed at this time. CB.

## 2024-09-25 ENCOUNTER — Ambulatory Visit: Admitting: Cardiovascular Disease

## 2024-10-11 ENCOUNTER — Ambulatory Visit: Payer: Self-pay

## 2025-05-03 ENCOUNTER — Ambulatory Visit: Payer: Self-pay
